# Patient Record
Sex: Male | Born: 1945
Health system: Southern US, Community
[De-identification: ages and names within clinical notes are randomized; demographics above are authoritative.]

## PROBLEM LIST (undated history)

## (undated) DIAGNOSIS — I4719 Other supraventricular tachycardia: Secondary | ICD-10-CM

## (undated) DIAGNOSIS — I471 Supraventricular tachycardia: Secondary | ICD-10-CM

## (undated) DIAGNOSIS — T4145XA Adverse effect of unspecified anesthetic, initial encounter: Secondary | ICD-10-CM

## (undated) DIAGNOSIS — I255 Ischemic cardiomyopathy: Secondary | ICD-10-CM

## (undated) DIAGNOSIS — I4819 Other persistent atrial fibrillation: Secondary | ICD-10-CM

## (undated) DIAGNOSIS — I10 Essential (primary) hypertension: Secondary | ICD-10-CM

## (undated) DIAGNOSIS — K219 Gastro-esophageal reflux disease without esophagitis: Secondary | ICD-10-CM

## (undated) DIAGNOSIS — I2510 Atherosclerotic heart disease of native coronary artery without angina pectoris: Secondary | ICD-10-CM

## (undated) DIAGNOSIS — Z8674 Personal history of sudden cardiac arrest: Secondary | ICD-10-CM

## (undated) DIAGNOSIS — E7800 Pure hypercholesterolemia, unspecified: Secondary | ICD-10-CM

## (undated) DIAGNOSIS — I4901 Ventricular fibrillation: Secondary | ICD-10-CM

## (undated) DIAGNOSIS — IMO0001 Reserved for inherently not codable concepts without codable children: Secondary | ICD-10-CM

## (undated) DIAGNOSIS — T8859XA Other complications of anesthesia, initial encounter: Secondary | ICD-10-CM

## (undated) DIAGNOSIS — K5900 Constipation, unspecified: Secondary | ICD-10-CM

## (undated) DIAGNOSIS — I340 Nonrheumatic mitral (valve) insufficiency: Secondary | ICD-10-CM

## (undated) HISTORY — DX: Ventricular fibrillation: I49.01

## (undated) HISTORY — DX: Other persistent atrial fibrillation: I48.19

## (undated) HISTORY — DX: Atherosclerotic heart disease of native coronary artery without angina pectoris: I25.10

## (undated) HISTORY — DX: Nonrheumatic mitral (valve) insufficiency: I34.0

## (undated) HISTORY — DX: Supraventricular tachycardia: I47.1

## (undated) HISTORY — PX: CORONARY ARTERY BYPASS GRAFT: SHX141

## (undated) HISTORY — PX: OTHER SURGICAL HISTORY: SHX169

## (undated) HISTORY — DX: Other supraventricular tachycardia: I47.19

## (undated) HISTORY — DX: Essential (primary) hypertension: I10

## (undated) HISTORY — DX: Pure hypercholesterolemia, unspecified: E78.00

## (undated) HISTORY — DX: Ischemic cardiomyopathy: I25.5

---

## 2003-05-11 ENCOUNTER — Other Ambulatory Visit: Admission: RE | Admit: 2003-05-11 | Discharge: 2003-05-11 | Payer: Self-pay | Admitting: Dermatology

## 2012-12-18 ENCOUNTER — Encounter (INDEPENDENT_AMBULATORY_CARE_PROVIDER_SITE_OTHER): Payer: Self-pay | Admitting: *Deleted

## 2012-12-30 ENCOUNTER — Ambulatory Visit (INDEPENDENT_AMBULATORY_CARE_PROVIDER_SITE_OTHER): Payer: Self-pay | Admitting: Internal Medicine

## 2013-01-06 ENCOUNTER — Ambulatory Visit (INDEPENDENT_AMBULATORY_CARE_PROVIDER_SITE_OTHER): Payer: Self-pay | Admitting: Internal Medicine

## 2013-01-08 ENCOUNTER — Ambulatory Visit (INDEPENDENT_AMBULATORY_CARE_PROVIDER_SITE_OTHER): Payer: Managed Care, Other (non HMO) | Admitting: Internal Medicine

## 2013-01-08 ENCOUNTER — Telehealth (INDEPENDENT_AMBULATORY_CARE_PROVIDER_SITE_OTHER): Payer: Self-pay | Admitting: *Deleted

## 2013-01-08 ENCOUNTER — Encounter (INDEPENDENT_AMBULATORY_CARE_PROVIDER_SITE_OTHER): Payer: Self-pay | Admitting: Internal Medicine

## 2013-01-08 ENCOUNTER — Other Ambulatory Visit (INDEPENDENT_AMBULATORY_CARE_PROVIDER_SITE_OTHER): Payer: Self-pay | Admitting: *Deleted

## 2013-01-08 VITALS — BP 120/84 | HR 76 | Ht 73.0 in | Wt 224.2 lb

## 2013-01-08 DIAGNOSIS — K5900 Constipation, unspecified: Secondary | ICD-10-CM

## 2013-01-08 DIAGNOSIS — I10 Essential (primary) hypertension: Secondary | ICD-10-CM | POA: Insufficient documentation

## 2013-01-08 DIAGNOSIS — Z1211 Encounter for screening for malignant neoplasm of colon: Secondary | ICD-10-CM

## 2013-01-08 DIAGNOSIS — E7800 Pure hypercholesterolemia, unspecified: Secondary | ICD-10-CM | POA: Insufficient documentation

## 2013-01-08 MED ORDER — PEG-KCL-NACL-NASULF-NA ASC-C 100 G PO SOLR: 1.0000 | Freq: Once | ORAL | Status: DC

## 2013-01-08 NOTE — Progress Notes (Signed)
Subjective:     Patient ID: Lonnie [name], male   DOB: Sep 03, 1946, 67 y.o.   MRN: [id number]  HPI Referred to our office by Dr. Regino Schultze for constipation. He has had constipation for a couple of months or longer. He will have a BM about once a week. Before this, he would have a BM 2-3 times a week. There has been no blood or black stools.  He has to strain to have a BM. No rectal pain.  He takes a stool softner everyday which is not helping.   He has never had a colonoscopy.   Review of Systems see hpi Current Outpatient Prescriptions  Medication Sig Dispense Refill  . lisinopril (PRINIVIL,ZESTRIL) 10 MG tablet Take 10 mg by mouth daily.      . ranitidine (ZANTAC) 150 MG tablet Take 150 mg by mouth 2 (two) times daily.      . simvastatin (ZOCOR) 10 MG tablet Take 10 mg by mouth at bedtime.       No current facility-administered medications for this visit.   Past Medical History  Diagnosis Date  . High cholesterol   . Hypertension    Past Surgical History  Procedure Laterality Date  . Stomach ulcer repair          Objective:   Physical Exam  Filed Vitals:   01/08/13 1522  BP: 120/84  Pulse: 76  Height: 6\' 1"  (1.854 m)  Weight: 224 lb 3.2 oz (101.696 kg)   Alert and oriented. Skin warm and dry. Oral mucosa is moist.   . Sclera anicteric, conjunctivae is pink. Thyroid not enlarged. No cervical lymphadenopathy. Lungs clear. Heart regular rate and rhythm.  Abdomen is soft. Bowel sounds are positive. No hepatomegaly. No abdominal masses felt. No tenderness.  No edema to lower extremities. Stool brown and guaiac negative.      Assessment:    Constipation, new onset. He has been started on a new cholesterol medicaton. No alarm symptoms. He has never undergone a colonoscopy in the pat.      Plan:     Increase fiber. Linzess daily. Colonoscopy with Dr. Karilyn Cota

## 2013-01-08 NOTE — Telephone Encounter (Signed)
Patient needs movi prep 

## 2013-01-08 NOTE — Patient Instructions (Addendum)
Colonoscopy with Dr. Karilyn Cota. Linzess samples given to patient. One a day. Linzess .

## 2013-01-13 ENCOUNTER — Encounter (HOSPITAL_COMMUNITY): Payer: Self-pay | Admitting: Pharmacy Technician

## 2013-01-14 ENCOUNTER — Encounter (INDEPENDENT_AMBULATORY_CARE_PROVIDER_SITE_OTHER): Payer: Self-pay

## 2013-01-27 ENCOUNTER — Telehealth (INDEPENDENT_AMBULATORY_CARE_PROVIDER_SITE_OTHER): Payer: Self-pay | Admitting: *Deleted

## 2013-01-27 MED ORDER — LINACLOTIDE 145 MCG PO CAPS: 145.0000 ug | ORAL_CAPSULE | Freq: Every day | ORAL | Status: DC

## 2013-01-27 NOTE — Telephone Encounter (Signed)
Patient states samples you gave him of Linzess is working and he would like a RX called to [facility] in [place]

## 2013-01-28 ENCOUNTER — Ambulatory Visit (HOSPITAL_COMMUNITY)
Admission: RE | Admit: 2013-01-28 | Discharge: 2013-01-28 | Disposition: A | Discharge status: Home / Self Care | Payer: Managed Care, Other (non HMO) | Source: Ambulatory Visit | Attending: Internal Medicine | Admitting: Internal Medicine

## 2013-01-28 ENCOUNTER — Other Ambulatory Visit (HOSPITAL_COMMUNITY): Payer: Self-pay | Admitting: Internal Medicine

## 2013-01-28 DIAGNOSIS — K5900 Constipation, unspecified: Secondary | ICD-10-CM

## 2013-01-28 DIAGNOSIS — R109 Unspecified abdominal pain: Secondary | ICD-10-CM

## 2013-01-30 ENCOUNTER — Encounter (HOSPITAL_COMMUNITY): Admission: RE | Payer: Self-pay | Source: Ambulatory Visit

## 2013-01-30 ENCOUNTER — Ambulatory Visit (HOSPITAL_COMMUNITY)
Admission: RE | Admit: 2013-01-30 | Payer: Managed Care, Other (non HMO) | Source: Ambulatory Visit | Admitting: Internal Medicine

## 2013-01-30 SURGERY — COLONOSCOPY
Anesthesia: Moderate Sedation

## 2013-01-31 ENCOUNTER — Other Ambulatory Visit: Payer: Self-pay

## 2013-01-31 ENCOUNTER — Inpatient Hospital Stay (HOSPITAL_COMMUNITY): Payer: Managed Care, Other (non HMO)

## 2013-01-31 ENCOUNTER — Encounter (HOSPITAL_COMMUNITY): Payer: Self-pay

## 2013-01-31 ENCOUNTER — Inpatient Hospital Stay (HOSPITAL_COMMUNITY)
Admission: RE | Admit: 2013-01-31 | Discharge: 2013-02-17 | DRG: 216 | Disposition: A | Discharge status: Home / Self Care | Payer: Managed Care, Other (non HMO) | Source: Other Acute Inpatient Hospital | Attending: Thoracic Surgery (Cardiothoracic Vascular Surgery) | Admitting: Thoracic Surgery (Cardiothoracic Vascular Surgery)

## 2013-01-31 DIAGNOSIS — R110 Nausea: Secondary | ICD-10-CM | POA: Diagnosis not present

## 2013-01-31 DIAGNOSIS — E7800 Pure hypercholesterolemia, unspecified: Secondary | ICD-10-CM | POA: Diagnosis present

## 2013-01-31 DIAGNOSIS — I509 Heart failure, unspecified: Secondary | ICD-10-CM

## 2013-01-31 DIAGNOSIS — E8779 Other fluid overload: Secondary | ICD-10-CM | POA: Diagnosis not present

## 2013-01-31 DIAGNOSIS — I340 Nonrheumatic mitral (valve) insufficiency: Secondary | ICD-10-CM

## 2013-01-31 DIAGNOSIS — E871 Hypo-osmolality and hyponatremia: Secondary | ICD-10-CM | POA: Diagnosis not present

## 2013-01-31 DIAGNOSIS — I2510 Atherosclerotic heart disease of native coronary artery without angina pectoris: Secondary | ICD-10-CM

## 2013-01-31 DIAGNOSIS — E8809 Other disorders of plasma-protein metabolism, not elsewhere classified: Secondary | ICD-10-CM | POA: Diagnosis not present

## 2013-01-31 DIAGNOSIS — Z79899 Other long term (current) drug therapy: Secondary | ICD-10-CM

## 2013-01-31 DIAGNOSIS — I471 Supraventricular tachycardia, unspecified: Secondary | ICD-10-CM

## 2013-01-31 DIAGNOSIS — E876 Hypokalemia: Secondary | ICD-10-CM | POA: Diagnosis not present

## 2013-01-31 DIAGNOSIS — I059 Rheumatic mitral valve disease, unspecified: Secondary | ICD-10-CM | POA: Diagnosis present

## 2013-01-31 DIAGNOSIS — R570 Cardiogenic shock: Secondary | ICD-10-CM | POA: Diagnosis not present

## 2013-01-31 DIAGNOSIS — I359 Nonrheumatic aortic valve disorder, unspecified: Secondary | ICD-10-CM

## 2013-01-31 DIAGNOSIS — E162 Hypoglycemia, unspecified: Secondary | ICD-10-CM | POA: Diagnosis not present

## 2013-01-31 DIAGNOSIS — Q2111 Secundum atrial septal defect: Secondary | ICD-10-CM

## 2013-01-31 DIAGNOSIS — I498 Other specified cardiac arrhythmias: Secondary | ICD-10-CM | POA: Diagnosis not present

## 2013-01-31 DIAGNOSIS — N183 Chronic kidney disease, stage 3 unspecified: Secondary | ICD-10-CM | POA: Diagnosis present

## 2013-01-31 DIAGNOSIS — R918 Other nonspecific abnormal finding of lung field: Secondary | ICD-10-CM

## 2013-01-31 DIAGNOSIS — D62 Acute posthemorrhagic anemia: Secondary | ICD-10-CM | POA: Diagnosis not present

## 2013-01-31 DIAGNOSIS — J9600 Acute respiratory failure, unspecified whether with hypoxia or hypercapnia: Secondary | ICD-10-CM

## 2013-01-31 DIAGNOSIS — I472 Ventricular tachycardia, unspecified: Secondary | ICD-10-CM

## 2013-01-31 DIAGNOSIS — Q211 Atrial septal defect: Secondary | ICD-10-CM

## 2013-01-31 DIAGNOSIS — I517 Cardiomegaly: Secondary | ICD-10-CM | POA: Diagnosis present

## 2013-01-31 DIAGNOSIS — I213 ST elevation (STEMI) myocardial infarction of unspecified site: Secondary | ICD-10-CM

## 2013-01-31 DIAGNOSIS — D689 Coagulation defect, unspecified: Secondary | ICD-10-CM | POA: Diagnosis not present

## 2013-01-31 DIAGNOSIS — N179 Acute kidney failure, unspecified: Secondary | ICD-10-CM | POA: Diagnosis present

## 2013-01-31 DIAGNOSIS — I10 Essential (primary) hypertension: Secondary | ICD-10-CM

## 2013-01-31 DIAGNOSIS — K560 Paralytic ileus: Secondary | ICD-10-CM | POA: Diagnosis not present

## 2013-01-31 DIAGNOSIS — J810 Acute pulmonary edema: Secondary | ICD-10-CM

## 2013-01-31 DIAGNOSIS — I2119 ST elevation (STEMI) myocardial infarction involving other coronary artery of inferior wall: Secondary | ICD-10-CM

## 2013-01-31 DIAGNOSIS — I4891 Unspecified atrial fibrillation: Secondary | ICD-10-CM | POA: Diagnosis not present

## 2013-01-31 DIAGNOSIS — Z952 Presence of prosthetic heart valve: Secondary | ICD-10-CM

## 2013-01-31 DIAGNOSIS — D72829 Elevated white blood cell count, unspecified: Secondary | ICD-10-CM

## 2013-01-31 DIAGNOSIS — I2589 Other forms of chronic ischemic heart disease: Secondary | ICD-10-CM | POA: Diagnosis present

## 2013-01-31 DIAGNOSIS — Z8249 Family history of ischemic heart disease and other diseases of the circulatory system: Secondary | ICD-10-CM

## 2013-01-31 DIAGNOSIS — D696 Thrombocytopenia, unspecified: Secondary | ICD-10-CM | POA: Diagnosis not present

## 2013-01-31 DIAGNOSIS — J40 Bronchitis, not specified as acute or chronic: Secondary | ICD-10-CM | POA: Diagnosis not present

## 2013-01-31 DIAGNOSIS — N170 Acute kidney failure with tubular necrosis: Secondary | ICD-10-CM | POA: Diagnosis not present

## 2013-01-31 DIAGNOSIS — I219 Acute myocardial infarction, unspecified: Secondary | ICD-10-CM

## 2013-01-31 DIAGNOSIS — I129 Hypertensive chronic kidney disease with stage 1 through stage 4 chronic kidney disease, or unspecified chronic kidney disease: Secondary | ICD-10-CM | POA: Diagnosis present

## 2013-01-31 DIAGNOSIS — Z8711 Personal history of peptic ulcer disease: Secondary | ICD-10-CM

## 2013-01-31 DIAGNOSIS — R7309 Other abnormal glucose: Secondary | ICD-10-CM | POA: Diagnosis not present

## 2013-01-31 DIAGNOSIS — I4729 Other ventricular tachycardia: Secondary | ICD-10-CM | POA: Diagnosis not present

## 2013-01-31 DIAGNOSIS — K219 Gastro-esophageal reflux disease without esophagitis: Secondary | ICD-10-CM | POA: Diagnosis present

## 2013-01-31 DIAGNOSIS — Z951 Presence of aortocoronary bypass graft: Secondary | ICD-10-CM

## 2013-01-31 DIAGNOSIS — E785 Hyperlipidemia, unspecified: Secondary | ICD-10-CM | POA: Diagnosis present

## 2013-01-31 DIAGNOSIS — Z87891 Personal history of nicotine dependence: Secondary | ICD-10-CM

## 2013-01-31 HISTORY — DX: Other complications of anesthesia, initial encounter: T88.59XA

## 2013-01-31 HISTORY — DX: Constipation, unspecified: K59.00

## 2013-01-31 HISTORY — DX: Gastro-esophageal reflux disease without esophagitis: K21.9

## 2013-01-31 HISTORY — DX: Adverse effect of unspecified anesthetic, initial encounter: T41.45XA

## 2013-01-31 HISTORY — DX: Reserved for inherently not codable concepts without codable children: IMO0001

## 2013-01-31 LAB — CBC WITH DIFFERENTIAL/PLATELET
Eosinophils Absolute: 0.1 10*3/uL (ref 0.0–0.7)
Eosinophils Relative: 0 % (ref 0–5)
HCT: 36.3 % — ABNORMAL LOW (ref 39.0–52.0)
Hemoglobin: 12.5 g/dL — ABNORMAL LOW (ref 13.0–17.0)
Lymphocytes Relative: 14 % (ref 12–46)
Lymphs Abs: 2.2 10*3/uL (ref 0.7–4.0)
MCH: 31.8 pg (ref 26.0–34.0)
MCV: 92.4 fL (ref 78.0–100.0)
Monocytes Relative: 12 % (ref 3–12)
RBC: 3.93 MIL/uL — ABNORMAL LOW (ref 4.22–5.81)
WBC: 15.2 10*3/uL — ABNORMAL HIGH (ref 4.0–10.5)

## 2013-01-31 LAB — COMPREHENSIVE METABOLIC PANEL
ALT: 103 U/L — ABNORMAL HIGH (ref 0–53)
Alkaline Phosphatase: 80 U/L (ref 39–117)
BUN: 29 mg/dL — ABNORMAL HIGH (ref 6–23)
CO2: 22 mEq/L (ref 19–32)
Calcium: 8.8 mg/dL (ref 8.4–10.5)
GFR calc Af Amer: 62 mL/min — ABNORMAL LOW (ref >90)
GFR calc non Af Amer: 54 mL/min — ABNORMAL LOW (ref >90)
Glucose, Bld: 118 mg/dL — ABNORMAL HIGH (ref 70–99)
Sodium: 137 mEq/L (ref 135–145)
Total Protein: 7 g/dL (ref 6.0–8.3)

## 2013-01-31 LAB — TROPONIN I: Troponin I: 1.22 ng/mL (ref <0.30)

## 2013-01-31 LAB — MRSA PCR SCREENING: MRSA by PCR: NEGATIVE

## 2013-01-31 LAB — HEPARIN LEVEL (UNFRACTIONATED)
Heparin Unfractionated: <0.1 IU/mL — ABNORMAL LOW (ref 0.30–0.70)
Heparin Unfractionated: 0.14 IU/mL — ABNORMAL LOW (ref 0.30–0.70)

## 2013-01-31 LAB — MAGNESIUM: Magnesium: 2.1 mg/dL (ref 1.5–2.5)

## 2013-01-31 LAB — CK TOTAL AND CKMB (NOT AT ARMC): CK, MB: 3.7 ng/mL (ref 0.3–4.0)

## 2013-01-31 MED ORDER — METOPROLOL TARTRATE 25 MG PO TABS
25.0000 mg | ORAL_TABLET | Freq: Three times a day (TID) | ORAL | Status: DC
  Administered 2013-01-31 – 2013-02-04 (×11): 25 mg via ORAL
  Filled 2013-01-31 (×19): qty 1

## 2013-01-31 MED ORDER — HEPARIN BOLUS VIA INFUSION
3000.0000 [IU] | Freq: Once | INTRAVENOUS | Status: AC
  Administered 2013-01-31: 3000 [IU] via INTRAVENOUS
  Filled 2013-01-31: qty 3000

## 2013-01-31 MED ORDER — ATORVASTATIN CALCIUM 80 MG PO TABS
80.0000 mg | ORAL_TABLET | Freq: Every day | ORAL | Status: DC
  Administered 2013-01-31 – 2013-02-16 (×14): 80 mg via ORAL
  Filled 2013-01-31 (×18): qty 1

## 2013-01-31 MED ORDER — HEPARIN (PORCINE) IN NACL 100-0.45 UNIT/ML-% IJ SOLN
2100.0000 [IU]/h | INTRAMUSCULAR | Status: DC
  Administered 2013-01-31: 1500 [IU]/h via INTRAVENOUS
  Administered 2013-01-31: 1800 [IU]/h via INTRAVENOUS
  Administered 2013-02-01: 2100 [IU]/h via INTRAVENOUS
  Filled 2013-01-31 (×3): qty 250

## 2013-01-31 MED ORDER — DOCUSATE SODIUM 100 MG PO CAPS
200.0000 mg | ORAL_CAPSULE | Freq: Every day | ORAL | Status: DC | PRN
  Filled 2013-01-31: qty 2

## 2013-01-31 MED ORDER — ALPRAZOLAM 0.5 MG PO TABS
1.0000 mg | ORAL_TABLET | Freq: Two times a day (BID) | ORAL | Status: DC | PRN
  Administered 2013-01-31: 0.5 mg via ORAL
  Administered 2013-02-03 – 2013-02-04 (×3): 1 mg via ORAL
  Filled 2013-01-31: qty 1
  Filled 2013-01-31 (×3): qty 2

## 2013-01-31 MED ORDER — ONDANSETRON HCL 4 MG/2ML IJ SOLN: 4.0000 mg | Freq: Four times a day (QID) | INTRAMUSCULAR | Status: DC | PRN

## 2013-01-31 MED ORDER — NITROGLYCERIN 0.4 MG SL SUBL: 0.4000 mg | SUBLINGUAL_TABLET | SUBLINGUAL | Status: DC | PRN

## 2013-01-31 MED ORDER — MORPHINE SULFATE 2 MG/ML IJ SOLN: 1.0000 mg | INTRAMUSCULAR | Status: DC | PRN

## 2013-01-31 MED ORDER — PNEUMOCOCCAL VAC POLYVALENT 25 MCG/0.5ML IJ INJ
0.5000 mL | INJECTION | INTRAMUSCULAR | Status: AC
  Administered 2013-02-01: 0.5 mL via INTRAMUSCULAR
  Filled 2013-01-31: qty 0.5

## 2013-01-31 MED ORDER — FUROSEMIDE 10 MG/ML IJ SOLN
40.0000 mg | Freq: Three times a day (TID) | INTRAMUSCULAR | Status: DC
  Administered 2013-01-31 – 2013-02-01 (×3): 40 mg via INTRAVENOUS
  Filled 2013-01-31 (×7): qty 4

## 2013-01-31 MED ORDER — HEPARIN BOLUS VIA INFUSION
4000.0000 [IU] | Freq: Once | INTRAVENOUS | Status: AC
  Administered 2013-01-31: 4000 [IU] via INTRAVENOUS
  Filled 2013-01-31: qty 4000

## 2013-01-31 MED ORDER — MORPHINE SULFATE 2 MG/ML IJ SOLN
1.0000 mg | Freq: Once | INTRAMUSCULAR | Status: AC
  Administered 2013-01-31: 1 mg via INTRAVENOUS
  Filled 2013-01-31: qty 1

## 2013-01-31 MED ORDER — ACETAMINOPHEN 325 MG PO TABS
650.0000 mg | ORAL_TABLET | ORAL | Status: DC | PRN
  Administered 2013-01-31 – 2013-02-03 (×3): 650 mg via ORAL
  Filled 2013-01-31 (×3): qty 2

## 2013-01-31 MED ORDER — PANTOPRAZOLE SODIUM 40 MG PO TBEC
40.0000 mg | DELAYED_RELEASE_TABLET | Freq: Every day | ORAL | Status: DC
  Administered 2013-01-31 – 2013-02-04 (×5): 40 mg via ORAL
  Filled 2013-01-31 (×4): qty 1

## 2013-01-31 MED ORDER — ASPIRIN EC 81 MG PO TBEC
81.0000 mg | DELAYED_RELEASE_TABLET | Freq: Every day | ORAL | Status: DC
  Administered 2013-02-01 – 2013-02-02 (×2): 81 mg via ORAL
  Filled 2013-01-31 (×3): qty 1

## 2013-01-31 MED ORDER — NAPROXEN 375 MG PO TABS
375.0000 mg | ORAL_TABLET | Freq: Two times a day (BID) | ORAL | Status: DC
  Administered 2013-01-31 – 2013-02-01 (×2): 375 mg via ORAL
  Filled 2013-01-31 (×4): qty 1

## 2013-01-31 MED ORDER — NAPROXEN 375 MG PO TABS: 375.0000 mg | ORAL_TABLET | Freq: Two times a day (BID) | ORAL | Status: DC | PRN

## 2013-01-31 MED ORDER — FUROSEMIDE 10 MG/ML IJ SOLN
40.0000 mg | Freq: Once | INTRAMUSCULAR | Status: AC
  Administered 2013-01-31: 40 mg via INTRAVENOUS

## 2013-01-31 NOTE — Progress Notes (Signed)
RN called as pt noted to be diaphoretic and SOB, No chest pain.  Evaluated pt, he states SOB baseline same as earlier today and no chest pain. Noted to be sweating which is now better with A/C setting adjustment but no acute distress.  ECG reviewed: Inferior Q wave with ST elevation (not changed), Anterior lead ST depression (not changed), Lateral lead ST depression has improved compared to ECG earlier today. Overall no ECG evidence of new/worsening ischemia. No bradycardia/AVB indicating RV infarct.  PE: sys murmer and faint rubs, positive for JVD, SOB worse with laying flat.  Vitals: BP stable, HR no changes, RR no changes compared to earlier today. O2 Sat >92%  Bebside Echo done: No/trivial pericardial effusion, mild MR, no VSD signal detected, EF 45-50% with dynamic septum and relative inferior hypokinesis. No evidence suggesting free wall defect. No sig changes compared to Echo earlier today. CVP ~15 mmHg.  CE sent STAT: Stable Trop lower than AM. Normal CKMB Cardiac Panel (last 3 results)  Recent Labs  01/31/13 0950 01/31/13 1428 01/31/13 2029  CKTOTAL  --   --  111  CKMB  --   --  3.7  TROPONINI 1.22* 1.11* 1.17*  RELINDX  --   --  3.3*    CXR: diffuse pulmonary edema - worse compared to earlier today  Impression:  1. Increased pulmonary edema/acute CHF in the setting of late presenting MI 2. No new ECG/echo/lab changes indicating new/worsening myocardial ischemia/injury.  Plan: 1. Increase diuresis to prepare cath Monday 2. Close hemodynamic monitor due to risk of possible mechanical complication of Late presenting MI 3. Continue tx for Dressler symdrome 4. Gentle afterload reduction to improve forward flow to reduce pulm edema and myocardial oxygen demand (Morphine)   Orders: 1. Lasix 40 mg IV extra now with increased scheduled Lasix. Further uptitrate per I/o in AM.  2. Morphine 1mg  x1 now and PRN. 3. Cycle CE q 6r. Continue Heparin drip   Discussed with pt,  family and RN.

## 2013-01-31 NOTE — H&P (Signed)
History and Physical  Patient ID: Lonnie [name] MRN: [id number], SOB: [birth year] 67 y.o. Date of Encounter: 01/31/2013, 8:12 AM  Primary Physician: Kirk Ruths, MD Primary Cardiologist: None  Chief Complaint: SOB, chest pressure  HPI: 67 y.o. male w/ PMHx significant for HTN, HL, FH of MI, who presented to [facility] on 01/31/2013 with complaints of SOB and chest pain.  The patient notes a 4-day history of dyspnea on exertion when walking across a room (baseline >1 block), as well as orthopnea requiring him to sit in an upright position (baseline can lie flat), and PND.  He notes no LE swelling.  During this same time, the patient describes chest pressure, described as a bilateral, diffuse, anterior chest pressure sensation, 7/10 in severity, non-radiating, worse with activity, with no alleviating factors.  Unchanged by deep breathing, food intake, or change in position.  The patient notes a non-productive cough, but no fevers, chills, sore throat, rhinorrhea, or sick contacts.  The patient presented to [facility] with these complaints, and was found to have an elevated BNP of 1862, troponin of 2.8, and bilateral opacities on CXR.  EKG showed ST elevation in III, III, aVF, and ST depression in V2-V5, I, and aVL.  The patient was given aspirin 324, lasix 40 IV, and started on a heparin drip.  Patient also given levaquin x1 dose.   Past Medical History  Diagnosis Date  . High cholesterol   . Hypertension   . Constipation   . Reflux   . Complication of anesthesia     "Hard time waking me up" after sedation after dental procedure     Surgical History:  Past Surgical History  Procedure Laterality Date  . Stomach ulcer repair       Home Meds: Prior to Admission medications   Medication Sig Start Date End Date Taking? Authorizing Provider  Linaclotide (LINZESS) 145 MCG CAPS Take 1 capsule (145 mcg total) by mouth daily. 01/27/13   Len Blalock, NP  lisinopril  (PRINIVIL,ZESTRIL) 10 MG tablet Take 10 mg by mouth daily.    Historical Provider, MD  peg 3350 powder (MOVIPREP) 100 G SOLR Take 1 kit (100 g total) by mouth once. 01/08/13   Len Blalock, NP  ranitidine (ZANTAC) 150 MG tablet Take 150 mg by mouth 2 (two) times daily.    Historical Provider, MD  simvastatin (ZOCOR) 10 MG tablet Take 10 mg by mouth at bedtime.    Historical Provider, MD    Allergies: No Known Allergies  History   Social History  . Marital Status: Married    Spouse Name: N/A    Number of Children: N/A  . Years of Education: N/A   Occupational History  . Not on file.   Social History Main Topics  . Smoking status: Former Games developer  . Smokeless tobacco: Not on file     Comment: 40 yrs ago  . Alcohol Use: Yes     Comment: week end - 1/5 of crown  . Drug Use: No  . Sexually Active: Not on file   Other Topics Concern  . Not on file   Social History Narrative  . No narrative on file     Family History  Problem Relation Age of Onset  . Heart attack Mother 10    Review of Systems: General: negative for chills, fever, night sweats or weight changes.  ENT: negative for rhinorrhea or epistaxis Cardiovascular: see HPI Dermatological: negative for rash Respiratory: negative for cough  or wheezing GI: negative for nausea, vomiting, diarrhea, bright red blood per rectum, melena, or hematemesis GU: no hematuria, urgency, or frequency Neurologic: negative for visual changes, syncope, headache, or dizziness Heme: no easy bruising or bleeding Endo: negative for excessive thirst, thyroid disorder, or flushing Musculoskeletal: negative for joint pain or swelling, negative for myalgias All other systems reviewed and are otherwise negative except as noted above.  Physical Exam: Blood pressure 136/99, pulse 99, resp. rate 37, height 6\' 1"  (1.854 m), weight 220 lb 14.4 oz (100.2 kg), SpO2 96.00%. General: Well developed, well nourished, alert and oriented, appears slightly  uncomfortable. HEENT: Normocephalic, atraumatic, sclera non-icteric, no xanthomas, nares are without discharge.  Neck: Supple. Carotids 2+ without bruits. Elevated JVP Lungs: Bilateral crackles in the lower to mid lung fields, mildly increased work of respiration Heart: RRR with normal S1 and S2. Grade III/VI "blowing" systolic murmur best heard at apex Abdomen: Soft, non-tender, non-distended with normoactive bowel sounds. No hepatomegaly. No rebound/guarding. No obvious abdominal masses. Back: No CVA tenderness Msk:  Strength and tone appear normal for age. Extremities: No clubbing, cyanosis, or edema.  Distal pedal pulses are 2+ and equal bilaterally. Neuro: CNII-XII intact, moves all extremities spontaneously. Psych:  Responds to questions appropriately with a normal affect.   Labs:   No results found for this basename: WBC, HGB, HCT, MCV, PLT   No results found for this basename: NA, K, CL, CO2, BUN, CREATININE, CALCIUM, LABALBU, PROT, BILITOT, ALKPHOS, ALT, AST, GLUCOSE,  in the last 168 hours No results found for this basename: CKTOTAL, CKMB, TROPONINI,  in the last 72 hours No results found for this basename: CHOL, HDL, LDLCALC, TRIG   No results found for this basename: DDIMER   Outside Labs from [place]: Na: 135 K: 3.9 Cl: 102 CO2: 23 BUN: 30 Cr: 1.49 Glucose: 132  WBC: 14.6 Hb: 11.7 HCT: 35.8 Plt: 255  Troponin: 2.8 CK: 132 CK-MB: 3.1 BNP: 1862  Radiology/Studies:  Dg Abd Acute W/chest  01/28/2013   *RADIOLOGY REPORT*  Clinical Data: Abdominal pain  ACUTE ABDOMEN SERIES (ABDOMEN 2 VIEW & CHEST 1 VIEW)  Comparison: None  Findings: The heart size is normal.  No pleural effusion identified.  Pulmonary vascular congestion noted.  No airspace consolidation.  Surgical clips are noted within the upper portion of the central abdomen.  No dilated loops of small bowel or air-fluid levels identified.  Gas and stool noted within the colon up to the rectum.  IMPRESSION:  1.   Nonobstructive bowel gas pattern. 2.  No acute cardiopulmonary abnormalities   Original Report Authenticated By: Signa Kell, M.D.     EKG: NSR, ST elevations in II, III, aVL, ST depression in V2-V5, I, and aVL  ASSESSMENT AND PLAN:  The patient is a 67 yo man, history of HTN, HL, FH of CAD, presenting with chest pain, orthopnea, and PND.  # Late presentation STEMI - The patient presents with exertional chest pressure for 4-7 days, with elevated troponin to 2.8 at [facility], and ST elevations in the inferior leads, concerning for inferior infarct.  The patient has a new systolic murmur, which likely represents mitral regurgitation, possibly with cord rupture due to MI.  The patient received aspirin and started a heparin drip at [facility]. -ordered echo -continue heparin drip -daily aspirin -start atorvastatin 80 -consider starting metoprolol 25 BID, pending echo results -consider cardiac cath pending echo results -NPO  # Acute CHF - the patient presents with DOE, orthopnea, and PND, with pulmonary edema  on CXR and elevated BNP, consistent with CHF exacerbation.  The patient has no prior known history of CHF.  This likely represents ischemic CM in the setting of his current MI.  He received lasix 40 mg IV once at [facility] prior to transfer 5/24. -lasix 40 IV TID -consider metoprolol per above -hold lisinopril given AKI -daily weights, strict I/O's -ordered echo for this morning  # AKI vs CKD - the patient presents with a creatinine of 1.49 at [facility].  This likely represents AKI, as patient reports no history of CKD, though we have no records for comparison.  Patient is hypervolemic on exam. -monitor I/O's -daily BMETs  # HTN - the patient has a history of HTN, currently normotensive -consider metoprolol per above -hold lisinopril  # Leukocytosis - the patient's WBC = 14.6 at [facility], likely representing stress response.  Patient given 1 dose of levaquin  for CAP at [facility], though patient notes no fever or productive cough, and CXR shows no obvious infiltrate (though could be masked by pulm edema). -hold antibiotics for now  # Hyperlipidemia - chronic, stable.  On simvastatin 10 at home. -start Atorvastatin 80  # Prophy - heparin  Signed, Janalyn Harder MD 01/31/2013, 8:12 AM  History and all data above reviewed.  Patient examined.  I agree with the findings as above.  The patient presents with chest discomfort and dyspnea starting one week ago.  He had some increased dyspnea Wed and his wife got him to come to the ER today.  He has inferior Q waves with some inferior ST elevation.  CXR was reviewed and there was diffuse pulmonary edema.  He has a wide mediastinum but this was present on CXR a few days ago.   However, this is clearly a late presentation of an inferior MI.  He complains of chest pain.  However, this is with lying flat or inspiration.  He does not sound like he is having active angina.  Troponins are elevated but trending down.  The patient exam reveals COR:RRR, 3/6 holosystolic anterior murmur  ,  Lungs: Bilateral right greater than left crackles  ,  Abd: Positive bowel sounds, no rebound no guarding, Ext No edema  Neck:  JVD 10 cm at 45 degrees  .  All available labs, radiology testing, previous records reviewed. Agree with documented assessment and plan. Inferior MI:  Late presentation with resultant CHF and MR.  I ordered a stat echo and the LV systolic function is not severely reduced EF is about 50% with an area of inferior akinesis.  The MR is not as severe as I would expect and there is no flail leaflet or papillary rupture (reviewed with Dr. Eden Emms).  We need to manage him medically with diuresis, ASA, low dose beta blocker.  I do not think that there is an indication for heparin with this late presentation.  He will eventually (when he is able to tolerate it) need a cath.  Of note we looked for other complications of late  presentation MI and there is no evidence of free wall rupture or VSD.  He will need repeat PA/Lat CXR when he is more stable to look at the mediastinum which I suspect is a tortuous aorta.   Lonnie [name]  11:52 AM  01/31/2013

## 2013-01-31 NOTE — Progress Notes (Signed)
  Echocardiogram 2D Echocardiogram has been performed.  Georgian Co 01/31/2013, 10:22 AM

## 2013-01-31 NOTE — Progress Notes (Signed)
01/31/13 1900  Vitals  BP ! 128/93 mmHg  MAP (mmHg) 101  Pt noted to be diaphoretic and short of breath. No c/o of chest pain. EKG done. Md paged

## 2013-01-31 NOTE — Progress Notes (Signed)
Report from Night RN. Chart reviewed together. Handoff complete.Introductions complete. Will continue to monitor and advise attending as needed.   

## 2013-01-31 NOTE — Progress Notes (Signed)
Lonnie [name]  67 y.o.  male  Subjective: Dyspnea and chest pain improved.  Allergy: Review of patient's allergies indicates no known allergies.  Objective: Vital signs in last 24 hours: Temp:  [97.6 F (36.4 C)-98.8 F (37.1 C)] 98.8 F (37.1 C) (05/24 1200) Pulse Rate:  [91-100] 99 (05/24 1800) Resp:  [24-38] 24 (05/24 1800) BP: (102-136)/(67-102) 125/84 mmHg (05/24 1700) SpO2:  [91 %-97 %] 94 % (05/24 1800) Weight:  [100.2 kg (220 lb 14.4 oz)] 100.2 kg (220 lb 14.4 oz) (05/24 0630)  100.2 kg (220 lb 14.4 oz) Body mass index is 29.15 kg/(m^2).  Weight change:    Total I/O since admission:  -1.2 L  General- Well developed; no acute distress  Neck- + JVD, no carotid bruits Lungs- bibasilar rales; expiratory wheeze Cardiovascular- normal PMI; normal S1 and S2; low pitched systolic decrescendo murmur at the left sternal border Abdomen- normal bowel sounds; soft and non-tender without masses or organomegaly Skin- Warm, no significant lesions Extremities- Nl distal pulses; trace edema  Lab Results: Cardiac Markers:   Recent Labs  01/31/13 0950 01/31/13 1428  TROPONINI 1.22* 1.11*   CBC:   Recent Labs  01/31/13 0950  WBC 15.2*  HGB 12.5*  HCT 36.3*  PLT 304   BMET:  Recent Labs  01/31/13 0950  NA 137  K 4.0  CL 99  CO2 22  GLUCOSE 118*  BUN 29*  CREATININE 1.33  CALCIUM 8.8   Hepatic Function:   Recent Labs  01/31/13 0950  PROT 7.0  ALBUMIN 2.9*  AST 41*  ALT 103*  ALKPHOS 80  BILITOT 0.7   EKG: none available for review  Imaging Studies/Results: Dg Chest   01/31/2013   No significant change in cardiomegaly with multifocal airspace opacities which could represent pneumonia or asymmetric edema.    Imaging: Imaging results have been reviewed  Medications:  I have reviewed the patient's current medications. Scheduled: .  aspirin EC  81 mg Oral Daily  . atorvastatin  80 mg Oral q1800  . furosemide  40 mg Intravenous Q8H  . metoprolol  tartrate  25 mg Oral Q8H  . naproxen  375 mg Oral BID WC  . pantoprazole  40 mg Oral Daily   Infusions:   . 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 Nonsteroidal will be added to current medications with PPI to prophylax against stress-induced GI ulceration.  Congestive heart failure improving with diuresis. Patient should be ready for cardiac catheterization by Monday.  Elevated transaminases, perhaps representing a component of hepatic congestion. Fairly significant hypoalbuminemia, which is probably not explained by acute cardiac problems.  Hypertension is adequately controlled.   LOS: 0 days   Lonnie [name] 01/31/2013, 7:15 PM

## 2013-01-31 NOTE — Progress Notes (Signed)
Patient ID: Lonnie [name], male   DOB: 07/03/1946, 67 y.o.   MRN: [id number] Asked to review echo by Dr Antoine Poche EF 50% with mid and basal inferior wall hypokinesis There is a nonsupported anterior leaflet cusp with prolapse.  Not obviously flail and no ruptured chord or papillary muscle seen His murmur is impressive and I think it is MR Surprised given the structural abnormality that MR is only mild  There is no VSD  Would increase beta blockade HR 90-100 and repeat echo in 3-4 days Check BNP  Discussed with family  [name]

## 2013-01-31 NOTE — Progress Notes (Signed)
CRITICAL VALUE ALERT  Critical value received:  Trop 1.22  Date of notification:  01/31/2013   Time of notification:  10:51 AM   Critical value read back:yes  Nurse who received alert:  Desiree Lucy   MD notified (1st page):  hocherin  Time of first page:  10:51 AM   MD notified (2nd page):  Time of second page:  Responding MD:  hocherin  Time MD responded:  10:51 AM

## 2013-01-31 NOTE — Progress Notes (Signed)
ANTICOAGULATION CONSULT NOTE - Initial Consult  Pharmacy Consult for heparin Indication: chest pain/ACS  No Known Allergies  Patient Measurements: Height: 6\' 1"  (185.4 cm) Weight: 220 lb 14.4 oz (100.2 kg) IBW/kg (Calculated) : 79.9 Heparin Dosing Weight: 100 kg  Vital Signs: Temp: 97.8 F (36.6 C) (05/24 0820) Temp src: Oral (05/24 0820) BP: 124/93 mmHg (05/24 0820) Pulse Rate: 99 (05/24 0630)  Labs:  Recent Labs  01/31/13 0950  HGB 12.5*  HCT 36.3*  PLT 304  APTT 29  LABPROT 14.5  INR 1.15  HEPARINUNFRC <0.10*  CREATININE 1.33  TROPONINI 1.22*    Estimated Creatinine Clearance: 67.1 ml/min (by C-G formula based on Cr of 1.33).   Medical History: Past Medical History  Diagnosis Date  . High cholesterol   . Hypertension   . Constipation   . Reflux   . Complication of anesthesia     "Hard time waking me up" after sedation after dental procedure    Medications:  Prescriptions prior to admission  Medication Sig Dispense Refill  . Linaclotide (LINZESS) 145 MCG CAPS Take 1 capsule (145 mcg total) by mouth daily.  30 capsule  4  . lisinopril (PRINIVIL,ZESTRIL) 10 MG tablet Take 10 mg by mouth daily.      . peg 3350 powder (MOVIPREP) 100 G SOLR Take 1 kit (100 g total) by mouth once.  1 kit  0  . ranitidine (ZANTAC) 150 MG tablet Take 150 mg by mouth 2 (two) times daily.      . simvastatin (ZOCOR) 10 MG tablet Take 10 mg by mouth at bedtime.        Assessment: 67 year old man transferred to [facility] from [facility] hospital this morning on heparin.  Initial dose of 5000 unit bolus and 1000 units/hr started at 0315 today.  Initial heparin level is <0.1.    Goal of Therapy:  Heparin level 0.3-0.7 units/ml Monitor platelets by anticoagulation protocol: Yes   Plan:   Heparin 4000 units IV bolus x 1  Increase heparin drip to 1500 units/hr  Heparin level in 6 hours  Adjust per goal  Daily heparin level and CBC while on heparin  Mickeal Skinner 01/31/2013,11:14 AM

## 2013-02-01 DIAGNOSIS — R918 Other nonspecific abnormal finding of lung field: Secondary | ICD-10-CM

## 2013-02-01 DIAGNOSIS — I2119 ST elevation (STEMI) myocardial infarction involving other coronary artery of inferior wall: Principal | ICD-10-CM

## 2013-02-01 DIAGNOSIS — N179 Acute kidney failure, unspecified: Secondary | ICD-10-CM

## 2013-02-01 DIAGNOSIS — D72829 Elevated white blood cell count, unspecified: Secondary | ICD-10-CM

## 2013-02-01 DIAGNOSIS — J810 Acute pulmonary edema: Secondary | ICD-10-CM

## 2013-02-01 LAB — BASIC METABOLIC PANEL
CO2: 30 mEq/L (ref 19–32)
Chloride: 96 mEq/L (ref 96–112)
Glucose, Bld: 135 mg/dL — ABNORMAL HIGH (ref 70–99)
Sodium: 136 mEq/L (ref 135–145)

## 2013-02-01 LAB — LIPID PANEL
HDL: 31 mg/dL — ABNORMAL LOW (ref >39)
LDL Cholesterol: 95 mg/dL (ref 0–99)
Total CHOL/HDL Ratio: 4.8 RATIO

## 2013-02-01 LAB — CBC
HCT: 35.6 % — ABNORMAL LOW (ref 39.0–52.0)
MCH: 31.9 pg (ref 26.0–34.0)
MCV: 92.2 fL (ref 78.0–100.0)
RBC: 3.86 MIL/uL — ABNORMAL LOW (ref 4.22–5.81)
WBC: 17.3 10*3/uL — ABNORMAL HIGH (ref 4.0–10.5)

## 2013-02-01 LAB — HEMOGLOBIN A1C: Mean Plasma Glucose: 103 mg/dL (ref <117)

## 2013-02-01 LAB — TSH: TSH: 2.711 u[IU]/mL (ref 0.350–4.500)

## 2013-02-01 MED ORDER — HEPARIN BOLUS VIA INFUSION
3000.0000 [IU] | Freq: Once | INTRAVENOUS | Status: AC
  Administered 2013-02-01: 3000 [IU] via INTRAVENOUS
  Filled 2013-02-01: qty 3000

## 2013-02-01 MED ORDER — FUROSEMIDE 10 MG/ML IJ SOLN
INTRAMUSCULAR | Status: AC
  Filled 2013-02-01: qty 4

## 2013-02-01 MED ORDER — LEVOFLOXACIN IN D5W 750 MG/150ML IV SOLN
750.0000 mg | INTRAVENOUS | Status: DC
  Administered 2013-02-01 – 2013-02-03 (×3): 750 mg via INTRAVENOUS
  Filled 2013-02-01 (×5): qty 150

## 2013-02-01 MED ORDER — LEVALBUTEROL HCL 0.63 MG/3ML IN NEBU
0.6300 mg | INHALATION_SOLUTION | Freq: Four times a day (QID) | RESPIRATORY_TRACT | Status: DC
  Administered 2013-02-01 – 2013-02-04 (×6): 0.63 mg via RESPIRATORY_TRACT
  Filled 2013-02-01 (×19): qty 3

## 2013-02-01 MED ORDER — FUROSEMIDE 10 MG/ML IJ SOLN
60.0000 mg | Freq: Three times a day (TID) | INTRAMUSCULAR | Status: DC
  Administered 2013-02-01 – 2013-02-05 (×10): 60 mg via INTRAVENOUS
  Filled 2013-02-01 (×12): qty 6

## 2013-02-01 MED ORDER — HEPARIN SODIUM (PORCINE) 5000 UNIT/ML IJ SOLN
5000.0000 [IU] | Freq: Three times a day (TID) | INTRAMUSCULAR | Status: DC
  Administered 2013-02-01 – 2013-02-04 (×10): 5000 [IU] via SUBCUTANEOUS
  Filled 2013-02-01 (×16): qty 1

## 2013-02-01 NOTE — Consult Note (Signed)
PULMONARY  / CRITICAL CARE MEDICINE  Name: Lonnie [name] MRN: [id number] DOB: 12-17-1945    ADMISSION DATE:  01/31/2013 CONSULTATION DATE:  5-25  REFERRING MD :  Cards  PRIMARY SERVICE: Cards  CHIEF COMPLAINT:  dyspnea  BRIEF PATIENT DESCRIPTION:  67 yo wm with late presentation of inferior MI 5-24. He was acutely SOB prior to admission and is better post diuresis of 1500 cc. His chest xray has ot improved and actually looks worse on 5-25 but he is better. + for night sweats for months(tsh pending) but no fever , chills or purulent sputum. Remote smoker quit 40 years ago. He does have +JVD and a murmur. PCCM has been asked to evaluate for possible pna.   SIGNIFICANT EVENTS / STUDIES:  5/24 :  2 d Echo  with ef 50%  Inferior wall hypokinesis.  LINES / TUBES: PIV  CULTURES: BC x 2 5/25>>   ANTIBIOTICS: none   HISTORY OF PRESENT ILLNESS:   67 yo wm with late presentation of inferior MI 5-24. He was acutely SOB prior to admission and is better post diuresis of 1500 cc. His chest xray has ot improved and actually looks worse on 5-25 but he is better. + for night sweats for months(tsh pending) but no fever , chills or purulent sputum. Remote smoker quit 40 years ago. He does have +JVD and a murmur. PCCM has been asked to evaluate for possible pna.   PAST MEDICAL HISTORY :  Past Medical History  Diagnosis Date  . High cholesterol   . Hypertension   . Constipation   . Reflux   . Complication of anesthesia     "Hard time waking me up" after sedation after dental procedure   Past Surgical History  Procedure Laterality Date  . Stomach ulcer repair     Prior to Admission medications   Medication Sig Start Date End Date Taking? Authorizing Provider  Ascorbic Acid (VITAMIN C PO) Take 1 tablet by mouth daily.   Yes Historical Provider, MD  Omega-3 Fatty Acids (FISH OIL) 1200 MG CAPS Take 1,200 mg by mouth daily.   Yes Historical Provider, MD  ranitidine (ZANTAC) 150 MG tablet  Take 150 mg by mouth 2 (two) times daily.   Yes Historical Provider, MD  VITAMIN E PO Take 1 capsule by mouth daily.   Yes Historical Provider, MD  Linaclotide Karlene Einstein) 145 MCG CAPS Take 1 capsule (145 mcg total) by mouth daily. 01/27/13   Len Blalock, NP  lisinopril (PRINIVIL,ZESTRIL) 10 MG tablet Take 10 mg by mouth daily.    Historical Provider, MD  simvastatin (ZOCOR) 10 MG tablet Take 10 mg by mouth at bedtime.    Historical Provider, MD   No Known Allergies  FAMILY HISTORY:  Family History  Problem Relation Age of Onset  . Heart attack Mother 49   SOCIAL HISTORY:  reports that he has quit smoking. He does not have any smokeless tobacco history on file. He reports that  drinks alcohol. He reports that he does not use illicit drugs. Quit 40 years ago REVIEW OF SYSTEMS:   10 point review of system taken, please see HPI for positives and negatives.   SUBJECTIVE:  NAD VITAL SIGNS: Temp:  [98.1 F (36.7 C)-98.9 F (37.2 C)] 98.1 F (36.7 C) (05/25 1200) Pulse Rate:  [78-106] 86 (05/25 1150) Resp:  [19-38] 20 (05/25 1150) BP: (98-138)/(70-102) 108/73 mmHg (05/25 1200) SpO2:  [89 %-97 %] 97 % (05/25 1150) HEMODYNAMICS: CV stable  On nasal cannula oxygen sats 95%     INTAKE / OUTPUT: Intake/Output     05/24 0701 - 05/25 0700 05/25 0701 - 05/26 0700   P.O. 160    I.V. (mL/kg) 296.8 (3)    Total Intake(mL/kg) 456.8 (4.6)    Urine (mL/kg/hr) 2250 (0.9) 425 (0.7)   Total Output 2250 425   Net -1793.2 -425          PHYSICAL EXAMINATION: General:  WNWDWM eating and in NAD Neuro:  Intact HEENT: +jvd Cardiovascular:  hsr with murmur  Lungs:  Bibasilar faint crackles , exp wheeze  Abdomen: +bs Musculoskeletal:  intact Skin:  warm  LABS:  Recent Labs Lab 01/31/13 0950 01/31/13 1428 01/31/13 2029 02/01/13 0110  HGB 12.5*  --   --  12.3*  WBC 15.2*  --   --  17.3*  PLT 304  --   --  327  NA 137  --   --  136  K 4.0  --   --  4.2  CL 99  --   --  96  CO2 22   --   --  30  GLUCOSE 118*  --   --  135*  BUN 29*  --   --  33*  CREATININE 1.33  --   --  1.47*  CALCIUM 8.8  --   --  9.0  MG 2.1  --   --   --   AST 41*  --   --   --   ALT 103*  --   --   --   ALKPHOS 80  --   --   --   BILITOT 0.7  --   --   --   PROT 7.0  --   --   --   ALBUMIN 2.9*  --   --   --   APTT 29  --   --   --   INR 1.15  --   --   --   TROPONINI 1.22* 1.11* 1.17*  --   PROBNP  --  11616.0*  --   --    No results found for this basename: GLUCAP,  in the last 168 hours  Imaging: Dg Chest Port 1 View  01/31/2013   *RADIOLOGY REPORT*  Clinical Data: Dyspnea.  PORTABLE CHEST - 1 VIEW  Comparison: Chest radiograph performed earlier today at 04:53 p.m.  Findings: The lungs are well-aerated.  There is worsening bilateral airspace opacification, most compatible with diffuse pneumonia. This may also reflect pulmonary edema.  No definite pleural effusion or pneumothorax is seen.  The cardiomediastinal silhouette is mildly enlarged.  No acute osseous abnormalities are seen.  IMPRESSION: Interval worsening of bilateral airspace opacification, most compatible with worsening diffuse pneumonia.  This could also reflect pulmonary edema.  Mild cardiomegaly noted.   Original Report Authenticated By: Tonia Ghent, M.D.   [facility] 1 View  01/31/2013   *RADIOLOGY REPORT*  Clinical Data: Shortness of breath  PORTABLE CHEST - 1 VIEW  Comparison: 01/31/2013  Findings: Mild cardiomegaly again noted.  Patchy bilateral multilobar airspace opacities are again noted.  Prominence of the central vasculature is present.  Trace effusions are noted.  No significant change.  IMPRESSION: No significant change in cardiomegaly with multifocal airspace opacities which could represent pneumonia or asymmetric edema.   Original Report Authenticated By: Christiana Pellant, M.D.     ASSESSMENT / PLAN:  PULMONARY A:BASDZ on CxR    Dyspnea  P:  R/o pna(doubt) Watch and repeat c x r (may be radiographic lag)   Cont diuresis Start empiric levaquin Start flutter valve and xopenex neb med  CARDIOVASCULAR  Intake/Output Summary (Last 24 hours) at 02/01/13 1318 Last data filed at 02/01/13 0900  Gross per 24 hour  Intake 431.55 ml  Output   2225 ml  Net -1793.45 ml   A: Inf MI late presentation. Reduced EF. MVP. BNP>11,000 P:  Per cards Agree with diuresis as able  RENAL Lab Results  Component Value Date   CREATININE 1.47* 02/01/2013   CREATININE 1.33 01/31/2013    A:  Rising creatine post diuresis P:   Gentle diuresis Will dc nsaids with increased creatine and hx of bleeding ulcer. Note ACE-I and rising creatine.  GASTROINTESTINAL A:  Hx of resected bleeding ulcer P:   PPI Note on aleve   HEMATOLOGIC A: Leukocytosis, either from AMI or infection or both P:  Serial CBCs  INFECTIOUS A:  WBC is 17 and has risen, but no other overt s/s of infection P:   monitor temp/wbc Empiric levaquin 750/d F/u BC Sputum c/s if able  Check urine for legionella and strep pneumoniae antigens   TODAY'S SUMMARY:  67 yo wm with late presentation of inferior MI 5-24. He was acutely SOB prior to admission and is better post diuresis of 1500 cc. His chest xray has ot improved and actually looks worse on 5-25 but he is better. + for night sweats for months(tsh pending) but no fever , chills or purulent sputum. Remote smoker quit 40 years ago. He does have +JVD and a murmur.  This pt really has no symptoms referable to infection other than an elevated WBC which can be seen with AMI.  His xray shows patchy pneumonic infiltrates likely from AMI and fractured capilliaries with edema , I doubt PNA. Nevertheless will start iv levaquin.  Will give a flutter valve. Will give neb med for wheezing.  Will f/u serial CXRs   Caryl Bis  [phone]  Cell  [phone]  If no response or cell goes to voicemail, call beeper [phone]  02/01/2013, 1:14 PM

## 2013-02-01 NOTE — Progress Notes (Signed)
Report from Night RN. Chart reviewed together. Handoff complete.Introductions complete. Will continue to monitor and advise attending as needed.   

## 2013-02-01 NOTE — Progress Notes (Addendum)
ANTICOAGULATION CONSULT NOTE - Follow up Consult  Pharmacy Consult for heparin Indication: chest pain/ACS  No Known Allergies  Patient Measurements: Height: 6\' 1"  (185.4 cm) Weight: 220 lb 14.4 oz (100.2 kg) IBW/kg (Calculated) : 79.9 Heparin Dosing Weight: 100 kg  Vital Signs: Temp: 98.9 F (37.2 C) (05/25 0000) Temp src: Oral (05/25 0000) BP: 110/82 mmHg (05/25 0200) Pulse Rate: 85 (05/25 0200)  Labs:  Recent Labs  01/31/13 0950 01/31/13 1428 01/31/13 1729 01/31/13 2029 02/01/13 0109 02/01/13 0110  HGB 12.5*  --   --   --   --  12.3*  HCT 36.3*  --   --   --   --  35.6*  PLT 304  --   --   --   --  327  APTT 29  --   --   --   --   --   LABPROT 14.5  --   --   --   --   --   INR 1.15  --   --   --   --   --   HEPARINUNFRC <0.10* 0.25* 0.14*  --  <0.10*  --   CREATININE 1.33  --   --   --   --  1.47*  CKTOTAL  --   --   --  111  --   --   CKMB  --   --   --  3.7  --   --   TROPONINI 1.22* 1.11*  --  1.17*  --   --     Estimated Creatinine Clearance: 60.7 ml/min (by C-G formula based on Cr of 1.47).   Medical History: Past Medical History  Diagnosis Date  . High cholesterol   . Hypertension   . Constipation   . Reflux   . Complication of anesthesia     "Hard time waking me up" after sedation after dental procedure    Medications:  Prescriptions prior to admission  Medication Sig Dispense Refill  . Ascorbic Acid (VITAMIN C PO) Take 1 tablet by mouth daily.      . Omega-3 Fatty Acids (FISH OIL) 1200 MG CAPS Take 1,200 mg by mouth daily.      . ranitidine (ZANTAC) 150 MG tablet Take 150 mg by mouth 2 (two) times daily.      Marland Kitchen VITAMIN E PO Take 1 capsule by mouth daily.      . Linaclotide (LINZESS) 145 MCG CAPS Take 1 capsule (145 mcg total) by mouth daily.  30 capsule  4  . lisinopril (PRINIVIL,ZESTRIL) 10 MG tablet Take 10 mg by mouth daily.      . simvastatin (ZOCOR) 10 MG tablet Take 10 mg by mouth at bedtime.        Assessment: 67 year old man  transferred to [facility] from [facility] hospital this morning on heparin.  Repeat heparin level is <0.1.    Goal of Therapy:  Heparin level 0.3-0.7 units/ml Monitor platelets by anticoagulation protocol: Yes   Plan:   Heparin 3000 units IV bolus x 1  Increase heparin drip to 2100 units/hr  Heparin level in 6 hours  Adjust per goal  Daily heparin level and CBC while on heparin  [name] 02/01/2013,5:47 AM

## 2013-02-01 NOTE — Progress Notes (Signed)
Notified Md about pts lopressor medication.  pts SBP 94 will hold per md.  Will continue to monitor. Lonnie [name]

## 2013-02-01 NOTE — Progress Notes (Signed)
SUBJECTIVE:  Chest pain as noted yesterday.  Currently no pain free.  Breathing is improved.    PHYSICAL EXAM Filed Vitals:   02/01/13 0400 02/01/13 0500 02/01/13 0600 02/01/13 0700  BP: 107/73 104/83 107/83 103/77  Pulse: 82 79 79 78  Temp: 98.2 F (36.8 C)     TempSrc: Oral     Resp: 19 21 21 23   Height:      Weight:      SpO2: 96% 96% 93% 96%   General:  No acute distress HEENT: PERRL Neck:  JVD 12 cm at 45 degrees. Lungs:  Diffuse crackles entire lung fields Heart:  RRR, murmur unchanged Abdomen:  Positive bowel sounds, no rebound no guarding Extremities:  No edema Neuro:  Nonfocal  LABS: Lab Results  Component Value Date   CKTOTAL 111 01/31/2013   CKMB 3.7 01/31/2013   TROPONINI 1.17* 01/31/2013   Results for orders placed during the hospital encounter of 01/31/13 (from the past 24 hour(s))  TROPONIN I     Status: Abnormal   Collection Time    01/31/13  9:50 AM      Result Value Range   Troponin I 1.22 (*) <0.30 ng/mL  PROTIME-INR     Status: None   Collection Time    01/31/13  9:50 AM      Result Value Range   Prothrombin Time 14.5  11.6 - 15.2 seconds   INR 1.15  0.00 - 1.49  APTT     Status: None   Collection Time    01/31/13  9:50 AM      Result Value Range   aPTT 29  24 - 37 seconds  CBC WITH DIFFERENTIAL     Status: Abnormal   Collection Time    01/31/13  9:50 AM      Result Value Range   WBC 15.2 (*) 4.0 - 10.5 K/uL   RBC 3.93 (*) 4.22 - 5.81 MIL/uL   Hemoglobin 12.5 (*) 13.0 - 17.0 g/dL   HCT 40.9 (*) 81.1 - 91.4 %   MCV 92.4  78.0 - 100.0 fL   MCH 31.8  26.0 - 34.0 pg   MCHC 34.4  30.0 - 36.0 g/dL   RDW 78.2  95.6 - 21.3 %   Platelets 304  150 - 400 K/uL   Neutrophils Relative % 73  43 - 77 %   Neutro Abs 11.1 (*) 1.7 - 7.7 K/uL   Lymphocytes Relative 14  12 - 46 %   Lymphs Abs 2.2  0.7 - 4.0 K/uL   Monocytes Relative 12  3 - 12 %   Monocytes Absolute 1.8 (*) 0.1 - 1.0 K/uL   Eosinophils Relative 0  0 - 5 %   Eosinophils Absolute 0.1   0.0 - 0.7 K/uL   Basophils Relative 0  0 - 1 %   Basophils Absolute 0.0  0.0 - 0.1 K/uL  COMPREHENSIVE METABOLIC PANEL     Status: Abnormal   Collection Time    01/31/13  9:50 AM      Result Value Range   Sodium 137  135 - 145 mEq/L   Potassium 4.0  3.5 - 5.1 mEq/L   Chloride 99  96 - 112 mEq/L   CO2 22  19 - 32 mEq/L   Glucose, Bld 118 (*) 70 - 99 mg/dL   BUN 29 (*) 6 - 23 mg/dL   Creatinine, Ser 0.86  0.50 - 1.35 mg/dL   Calcium 8.8  8.4 -  10.5 mg/dL   Total Protein 7.0  6.0 - 8.3 g/dL   Albumin 2.9 (*) 3.5 - 5.2 g/dL   AST 41 (*) 0 - 37 U/L   ALT 103 (*) 0 - 53 U/L   Alkaline Phosphatase 80  39 - 117 U/L   Total Bilirubin 0.7  0.3 - 1.2 mg/dL   GFR calc non Af Amer 54 (*) >90 mL/min   GFR calc Af Amer 62 (*) >90 mL/min  MAGNESIUM     Status: None   Collection Time    01/31/13  9:50 AM      Result Value Range   Magnesium 2.1  1.5 - 2.5 mg/dL  HEMOGLOBIN Y7W     Status: None   Collection Time    01/31/13  9:50 AM      Result Value Range   Hemoglobin A1C 5.2  <5.7 %   Mean Plasma Glucose 103  <117 mg/dL  HEPARIN LEVEL (UNFRACTIONATED)     Status: Abnormal   Collection Time    01/31/13  9:50 AM      Result Value Range   Heparin Unfractionated <0.10 (*) 0.30 - 0.70 IU/mL  TROPONIN I     Status: Abnormal   Collection Time    01/31/13  2:28 PM      Result Value Range   Troponin I 1.11 (*) <0.30 ng/mL  PRO B NATRIURETIC PEPTIDE     Status: Abnormal   Collection Time    01/31/13  2:28 PM      Result Value Range   Pro B Natriuretic peptide (BNP) 11616.0 (*) 0 - 125 pg/mL  HEPARIN LEVEL (UNFRACTIONATED)     Status: Abnormal   Collection Time    01/31/13  2:28 PM      Result Value Range   Heparin Unfractionated 0.25 (*) 0.30 - 0.70 IU/mL  HEPARIN LEVEL (UNFRACTIONATED)     Status: Abnormal   Collection Time    01/31/13  5:29 PM      Result Value Range   Heparin Unfractionated 0.14 (*) 0.30 - 0.70 IU/mL  TROPONIN I     Status: Abnormal   Collection Time    01/31/13   8:29 PM      Result Value Range   Troponin I 1.17 (*) <0.30 ng/mL  CK TOTAL AND CKMB     Status: Abnormal   Collection Time    01/31/13  8:29 PM      Result Value Range   Total CK 111  7 - 232 U/L   CK, MB 3.7  0.3 - 4.0 ng/mL   Relative Index 3.3 (*) 0.0 - 2.5  HEPARIN LEVEL (UNFRACTIONATED)     Status: Abnormal   Collection Time    02/01/13  1:09 AM      Result Value Range   Heparin Unfractionated <0.10 (*) 0.30 - 0.70 IU/mL  BASIC METABOLIC PANEL     Status: Abnormal   Collection Time    02/01/13  1:10 AM      Result Value Range   Sodium 136  135 - 145 mEq/L   Potassium 4.2  3.5 - 5.1 mEq/L   Chloride 96  96 - 112 mEq/L   CO2 30  19 - 32 mEq/L   Glucose, Bld 135 (*) 70 - 99 mg/dL   BUN 33 (*) 6 - 23 mg/dL   Creatinine, Ser 2.95 (*) 0.50 - 1.35 mg/dL   Calcium 9.0  8.4 - 62.1 mg/dL   GFR calc non  Af Amer 48 (*) >90 mL/min   GFR calc Af Amer 55 (*) >90 mL/min  CBC     Status: Abnormal   Collection Time    02/01/13  1:10 AM      Result Value Range   WBC 17.3 (*) 4.0 - 10.5 K/uL   RBC 3.86 (*) 4.22 - 5.81 MIL/uL   Hemoglobin 12.3 (*) 13.0 - 17.0 g/dL   HCT 19.1 (*) 47.8 - 29.5 %   MCV 92.2  78.0 - 100.0 fL   MCH 31.9  26.0 - 34.0 pg   MCHC 34.6  30.0 - 36.0 g/dL   RDW 62.1  30.8 - 65.7 %   Platelets 327  150 - 400 K/uL  LIPID PANEL     Status: Abnormal   Collection Time    02/01/13  1:10 AM      Result Value Range   Cholesterol 149  0 - 200 mg/dL   Triglycerides 846  <962 mg/dL   HDL 31 (*) >95 mg/dL   Total CHOL/HDL Ratio 4.8     VLDL 23  0 - 40 mg/dL   LDL Cholesterol 95  0 - 99 mg/dL    Intake/Output Summary (Last 24 hours) at 02/01/13 0739 Last data filed at 02/01/13 0500  Gross per 24 hour  Intake  456.8 ml  Output   2250 ml  Net -1793.2 ml   CXR:  Interval worsening of bilateral airspace opacification, most  compatible with worsening diffuse pneumonia. This could also  reflect pulmonary edema. Mild cardiomegaly noted.   EKG:  NSR, rate 85,  inferior infarct, T wave inversion in the anterior leads.  Less ST elevation in the inferior leads.   ASSESSMENT AND PLAN:  ACUTE CHF:  CHF seems to be out of proportion to the degree of LV dysfunction on echo.  CXR today actually looks slightly worse although the patient feels better.  Continuing gentle diuresis.  I am going to order blood and urine cultures and empirically cover for possible pneumonia.  He will need right and left heart cath electively pending his ability to lie flat and his creat.    CAD:  Late presentation of an inferior MI.  AKI:  Follow creat closely as above.   HTN:  This is being managed in the context of treating his CHF  MR:  As noted on the echo.  This is an anteriorly directed jet but does not appear to be a flail leaflet or ruptured pap muscle.  TEE this admission might be indicated.      Rollene Rotunda 02/01/2013 7:39 AM

## 2013-02-02 ENCOUNTER — Inpatient Hospital Stay (HOSPITAL_COMMUNITY): Payer: Managed Care, Other (non HMO)

## 2013-02-02 DIAGNOSIS — I509 Heart failure, unspecified: Secondary | ICD-10-CM

## 2013-02-02 LAB — BASIC METABOLIC PANEL
Calcium: 9 mg/dL (ref 8.4–10.5)
GFR calc non Af Amer: 57 mL/min — ABNORMAL LOW (ref >90)
Sodium: 137 mEq/L (ref 135–145)

## 2013-02-02 MED ORDER — FUROSEMIDE 10 MG/ML IJ SOLN
INTRAMUSCULAR | Status: AC
  Filled 2013-02-02: qty 8

## 2013-02-02 NOTE — Progress Notes (Signed)
Progress Note  Name:  Lonnie [name]  Date:  02/02/2013 7:07 AM    Subjective:  Breathing better.  Still c/o orthopnea.  No PND.  No chest pain.    Objective:    Vital signs in last 24 hours: Temp:  [97.4 F (36.3 C)-98.3 F (36.8 C)] 98.3 F (36.8 C) (05/26 0338) Pulse Rate:  [80-100] 85 (05/26 0513) Resp:  [18-36] 22 (05/26 0338) BP: (94-114)/(65-87) 94/65 mmHg (05/26 0513) SpO2:  [91 %-100 %] 97 % (05/26 0338) Weight:  [218 lb 11.1 oz (99.2 kg)] 218 lb 11.1 oz (99.2 kg) (05/26 0338)  218 lb 11.1 oz (99.2 kg) Body mass index is 28.86 kg/(m^2).  Weight change:  Last BM Date: 01/30/13  Intake/Output from previous day: 05/25 0701 - 05/26 0700 In: 510 [P.O.:360; IV Piggyback:150] Out: 2400 [Urine:2400]   Tele:  NSR  PHYSICAL EXAM General: no acute distress Neck:  + JVD at 90 degrees Lungs: bibasilar rales 2/3 up chest   Heart: normal S1, S2;  Regular rate and rhythm, 3/6 holosystolic murmur at LLSB and apex Abdomen: soft, nontender Extremities: Tr edema  Feet cool Skin:  No rash; warm and dry Psych:  Normal affect  Tele:  SR  Lab Results: Cardiac Markers:   Recent Labs  01/31/13 1428 01/31/13 2029  TROPONINI 1.11* 1.17*   CBC:   Recent Labs  01/31/13 0950 02/01/13 0110  WBC 15.2* 17.3*  HGB 12.5* 12.3*  HCT 36.3* 35.6*  PLT 304 327   BMET:  Recent Labs  01/31/13 0950 02/01/13 0110  NA 137 136  K 4.0 4.2  CL 99 96  CO2 22 30  GLUCOSE 118* 135*  BUN 29* 33*  CREATININE 1.33 1.47*  CALCIUM 8.8 9.0   Hepatic Function:   Recent Labs  01/31/13 0950  PROT 7.0  ALBUMIN 2.9*  AST 41*  ALT 103*  ALKPHOS 80  BILITOT 0.7   GFR:  Estimated Creatinine Clearance: 60.4 ml/min (by C-G formula based on Cr of 1.47). Lipids:   Recent Labs  02/01/13 0110  CHOL 149  TRIG 114  HDL 31*    Radiology/Studies:  Dg Chest Port 1 View  01/31/2013      IMPRESSION: Interval worsening of bilateral airspace opacification, most compatible with worsening  diffuse pneumonia.  This could also reflect pulmonary edema.  Mild cardiomegaly noted.   Original Report Authenticated By: Tonia Ghent, M.D.   Dg Chest Port 1 View  01/31/2013   IMPRESSION: No significant change in cardiomegaly with multifocal airspace opacities which could represent pneumonia or asymmetric edema.   Original Report Authenticated By: Christiana Pellant, M.D.   Dg Abd Acute W/chest  01/28/2013      IMPRESSION:  1.  Nonobstructive bowel gas pattern. 2.  No acute cardiopulmonary abnormalities   Original Report Authenticated By: Signa Kell, M.D.   Echo 01/31/13: Study Conclusions  - Left ventricle: Mid and basal inferior wall hypokinesis The cavity size was mildly dilated. Wall thickness was increased in a pattern of mild LVH. The estimated ejection fraction was 50%. - Mitral valve: There appears to be a prolapsing segment to the anterior leaflet. Surprisingly degree of MR appears more mild than expected. No flail chords or papillary muscle seen Mild regurgitation. - Left atrium: The atrium was mildly dilated. - Atrial septum: Some subcostal images suggest flow across the atrial septum but I suspect this is eccentric TR. No VSD flow - Impressions: Suggest increase beta blocker Rx and repeat echo next week regarding MR  Impressions:  - Suggest increase beta blocker Rx and repeat echo next week regarding MR   Assessment and Plan:  1. Acute CHF:  In setting of late presentation MI with fairly preserved LVF and mild MR.  Continues to diurese and improve symptomatically.  Still has volume to lose.  Continue Lasix 60 IV q 8 hrs.  CXR from this AM pending. 2. Abnormal CXR:  Appreciate evaluation and management by PCCM.  Patient covered with empiric antibiotics currently.  Cultures pending.   3. CAD:  Late presentation inferior MI.  Will ultimately need cardiac cath.  Continue ASA, heparin, statin. 4. AKI:  Renal fxn stable.  AM labs pending. 5. HTN:  Controlled.  6. Mitral  Regurgitation:  More mild than expected.  May need TEE.  Signed, Tereso Newcomer, PA-C  02/02/2013 7:08 AM   Patient seen and examined  I have amended physical exam to reflect findings.   Patient still remains volume overloaded, unable to lie flat  BP is 90s to 100/ I have reviewed echo done a couple days ago.  Inferior wall appears severely hypokinetic/ akinetic (base/mid)  MR is more severe than reported--eccentric.  I think more due to a posterior leaflet problem.  RV function is not normal which supports his current hemodynamics. Continue IV lasix  His Cr is a little better at 1.27.  Watch BP/ Cath when fluids improve. He should have a TEE to evaluate MV   Dietrich Pates

## 2013-02-02 NOTE — Progress Notes (Signed)
PULMONARY  / CRITICAL CARE MEDICINE  Name: Lonnie [name] MRN: [id number] DOB: 1945-11-25    ADMISSION DATE:  01/31/2013 CONSULTATION DATE:  5-25  REFERRING MD :  Cards  PRIMARY SERVICE: Cards  CHIEF COMPLAINT:  dyspnea  BRIEF PATIENT DESCRIPTION:  67 yo wm with late presentation of inferior MI 5-24. He was acutely SOB prior to admission and is better post diuresis of 1500 cc. His chest xray has ot improved and actually looks worse on 5-25 but he is better. + for night sweats for months(tsh pending) but no fever , chills or purulent sputum. Remote smoker quit 40 years ago. He does have +JVD and a murmur. PCCM has been asked to evaluate for possible pna.   SIGNIFICANT EVENTS / STUDIES:  5/24 :  2 d Echo  with ef 50%  Inferior wall hypokinesis.  LINES / TUBES: PIV  CULTURES: BC x 2 5/25>>   ANTIBIOTICS: 5/25 levaquin >>   HISTORY OF PRESENT ILLNESS:   67 yo wm with late presentation of inferior MI 5-24. He was acutely SOB prior to admission and is better post diuresis of 1500 cc. His chest xray has ot improved and actually looks worse on 5-25 but he is better. + for night sweats for months(tsh pending) but no fever , chills or purulent sputum. Remote smoker quit 40 years ago. He does have +JVD and a murmur. PCCM has been asked to evaluate for possible pna.  SUBJECTIVE:  Afebrile Breathing better Had to sleep in recliner last 2 ds  VITAL SIGNS: Temp:  [97.4 F (36.3 C)-98.7 F (37.1 C)] 98.7 F (37.1 C) (05/26 0800) Pulse Rate:  [80-100] 82 (05/26 0800) Resp:  [18-36] 21 (05/26 0800) BP: (93-114)/(65-87) 93/65 mmHg (05/26 0800) SpO2:  [91 %-100 %] 98 % (05/26 0800) Weight:  [99.2 kg (218 lb 11.1 oz)] 99.2 kg (218 lb 11.1 oz) (05/26 0338) HEMODYNAMICS: CV stable   On nasal cannula oxygen 4L sats 95%     INTAKE / OUTPUT: Intake/Output     05/25 0701 - 05/26 0700 05/26 0701 - 05/27 0700   P.O. 360    I.V. (mL/kg)     IV Piggyback 150    Total Intake(mL/kg) 510  (5.1)    Urine (mL/kg/hr) 2400 (1) 150 (0.5)   Total Output 2400 150   Net -1890 -150          PHYSICAL EXAMINATION: General:  WNWDWM eating and in NAD Neuro:  Intact HEENT: +jvd Cardiovascular:  hsr with murmur  Lungs:  Bibasilar faint crackles , exp wheeze  Abdomen: +bs Musculoskeletal:  intact Skin:  warm  LABS:  Recent Labs Lab 01/31/13 0950 01/31/13 1428 01/31/13 2029 02/01/13 0110 02/02/13 0845  HGB 12.5*  --   --  12.3*  --   WBC 15.2*  --   --  17.3*  --   PLT 304  --   --  327  --   NA 137  --   --  136 137  K 4.0  --   --  4.2 3.9  CL 99  --   --  96 95*  CO2 22  --   --  30 30  GLUCOSE 118*  --   --  135* 180*  BUN 29*  --   --  33* 35*  CREATININE 1.33  --   --  1.47* 1.26  CALCIUM 8.8  --   --  9.0 9.0  MG 2.1  --   --   --   --  AST 41*  --   --   --   --   ALT 103*  --   --   --   --   ALKPHOS 80  --   --   --   --   BILITOT 0.7  --   --   --   --   PROT 7.0  --   --   --   --   ALBUMIN 2.9*  --   --   --   --   APTT 29  --   --   --   --   INR 1.15  --   --   --   --   TROPONINI 1.22* 1.11* 1.17*  --   --   PROBNP  --  11616.0*  --   --   --    No results found for this basename: GLUCAP,  in the last 168 hours  Imaging: Dg Chest Port 1 View  02/02/2013   *RADIOLOGY REPORT*  Clinical Data: Shortness of breath.  PORTABLE CHEST - 1 VIEW  Comparison: 01/31/2013.  Findings: The cardiac silhouette, mediastinal and hilar contours are prominent but unchanged.  Persistent bilateral interstitial and airspace process.  No definite pleural effusions.  IMPRESSION: Persistent bilateral interstitial and airspace process.   Original Report Authenticated By: Rudie Meyer, M.D.   Dg Chest Port 1 View  01/31/2013   *RADIOLOGY REPORT*  Clinical Data: Dyspnea.  PORTABLE CHEST - 1 VIEW  Comparison: Chest radiograph performed earlier today at 04:53 p.m.  Findings: The lungs are well-aerated.  There is worsening bilateral airspace opacification, most compatible with  diffuse pneumonia. This may also reflect pulmonary edema.  No definite pleural effusion or pneumothorax is seen.  The cardiomediastinal silhouette is mildly enlarged.  No acute osseous abnormalities are seen.  IMPRESSION: Interval worsening of bilateral airspace opacification, most compatible with worsening diffuse pneumonia.  This could also reflect pulmonary edema.  Mild cardiomegaly noted.   Original Report Authenticated By: Tonia Ghent, M.D.   [facility] 1 View  01/31/2013   *RADIOLOGY REPORT*  Clinical Data: Shortness of breath  PORTABLE CHEST - 1 VIEW  Comparison: 01/31/2013  Findings: Mild cardiomegaly again noted.  Patchy bilateral multilobar airspace opacities are again noted.  Prominence of the central vasculature is present.  Trace effusions are noted.  No significant change.  IMPRESSION: No significant change in cardiomegaly with multifocal airspace opacities which could represent pneumonia or asymmetric edema.   Original Report Authenticated By: Christiana Pellant, M.D.     ASSESSMENT / PLAN:  PULMONARY A:BASDZ on CxR - Favor edema rather than pna based on development after adm, high BNP  P:   R/o pna(doubt) Watch and repeat c x r (may be radiographic lag)  Cont diuresis ct empiric levaquin Start flutter valve and xopenex neb med  CARDIOVASCULAR  Intake/Output Summary (Last 24 hours) at 02/02/13 0955 Last data filed at 02/02/13 0800  Gross per 24 hour  Intake    510 ml  Output   2125 ml  Net  -1615 ml   A: Inf MI late presentation. Reduced EF. MVP. BNP>11,000 P:  Per cards Agree with diuresis as able  RENAL Lab Results  Component Value Date   CREATININE 1.26 02/02/2013   CREATININE 1.47* 02/01/2013   CREATININE 1.33 01/31/2013    A:  Rising creatine post diuresis P:   Gentle diuresis Will dc nsaids with increased creatine and hx of bleeding ulcer. Note ACE-I and rising creatine.  GASTROINTESTINAL A:  Hx of resected bleeding ulcer P:   PPI Note on aleve    HEMATOLOGIC A: Leukocytosis, either from AMI or infection or both P:  Serial CBCs  INFECTIOUS A:  WBC is 17 and has risen, but no other overt s/s of infection P:   monitor temp/wbc Empiric levaquin 750/d F/u BC Sputum c/s if able  await urine for legionella and strep pneumoniae antigens   TODAY'S SUMMARY:  67 yo wm with late presentation of inferior MI 5-24. He was acutely SOB prior to admission and is better post diuresis of 1500 cc. His chest xray has ot improved and actually looks worse on 5-25 but he is better. + for night sweats for months(tsh pending) but no fever , chills or purulent sputum. Remote smoker quit 40 years ago. He does have +JVD and a murmur.  This pt really has no symptoms referable to infection other than an elevated WBC which can be seen with AMI.  His xray shows patchy pneumonic infiltrates likely from AMI and fractured capilliaries with edema , I doubt PNA.- Improved with diuresis ct iv levaquin.  Will give a flutter valve. Will give neb med for wheezing.  Will f/u serial CXRs   [facility] V.MD Beeper  [phone]    If no response , call beeper [phone]  02/02/2013, 9:55 AM

## 2013-02-03 ENCOUNTER — Other Ambulatory Visit: Payer: Self-pay | Admitting: *Deleted

## 2013-02-03 ENCOUNTER — Encounter (HOSPITAL_COMMUNITY)
Admission: RE | Disposition: A | Discharge status: Home / Self Care | Payer: Self-pay | Source: Other Acute Inpatient Hospital | Attending: Thoracic Surgery (Cardiothoracic Vascular Surgery)

## 2013-02-03 DIAGNOSIS — I2510 Atherosclerotic heart disease of native coronary artery without angina pectoris: Secondary | ICD-10-CM

## 2013-02-03 DIAGNOSIS — I359 Nonrheumatic aortic valve disorder, unspecified: Secondary | ICD-10-CM

## 2013-02-03 DIAGNOSIS — I059 Rheumatic mitral valve disease, unspecified: Secondary | ICD-10-CM

## 2013-02-03 HISTORY — PX: LEFT HEART CATHETERIZATION WITH CORONARY ANGIOGRAM: SHX5451

## 2013-02-03 LAB — BASIC METABOLIC PANEL
GFR calc Af Amer: 63 mL/min — ABNORMAL LOW (ref >90)
GFR calc non Af Amer: 54 mL/min — ABNORMAL LOW (ref >90)
Potassium: 3.7 mEq/L (ref 3.5–5.1)
Sodium: 137 mEq/L (ref 135–145)

## 2013-02-03 LAB — POCT I-STAT 3, VENOUS BLOOD GAS (G3P V)
Bicarbonate: 35.9 mEq/L — ABNORMAL HIGH (ref 20.0–24.0)
Bicarbonate: 34.6 mEq/L — ABNORMAL HIGH (ref 20.0–24.0)
pH, Ven: 7.499 — ABNORMAL HIGH (ref 7.250–7.300)
pH, Ven: 7.43 — ABNORMAL HIGH (ref 7.250–7.300)
pO2, Ven: 29 mmHg — CL (ref 30.0–45.0)

## 2013-02-03 SURGERY — LEFT HEART CATHETERIZATION WITH CORONARY ANGIOGRAM
Anesthesia: LOCAL

## 2013-02-03 MED ORDER — FENTANYL CITRATE 0.05 MG/ML IJ SOLN
INTRAMUSCULAR | Status: AC
  Filled 2013-02-03: qty 2

## 2013-02-03 MED ORDER — SODIUM CHLORIDE 0.9 % IV SOLN
1.0000 mL/kg/h | INTRAVENOUS | Status: DC
  Administered 2013-02-03: 1 mL/kg/h via INTRAVENOUS

## 2013-02-03 MED ORDER — LIDOCAINE HCL (PF) 1 % IJ SOLN
INTRAMUSCULAR | Status: AC
  Filled 2013-02-03: qty 30

## 2013-02-03 MED ORDER — SODIUM CHLORIDE 0.9 % IV SOLN: INTRAVENOUS | Status: AC

## 2013-02-03 MED ORDER — DIAZEPAM 5 MG PO TABS
5.0000 mg | ORAL_TABLET | ORAL | Status: AC
  Administered 2013-02-03: 5 mg via ORAL
  Filled 2013-02-03: qty 1

## 2013-02-03 MED ORDER — HEPARIN (PORCINE) IN NACL 2-0.9 UNIT/ML-% IJ SOLN
INTRAMUSCULAR | Status: AC
  Filled 2013-02-03: qty 1000

## 2013-02-03 MED ORDER — ASPIRIN EC 81 MG PO TBEC
81.0000 mg | DELAYED_RELEASE_TABLET | Freq: Every day | ORAL | Status: DC
  Administered 2013-02-04: 81 mg via ORAL
  Filled 2013-02-03 (×2): qty 1

## 2013-02-03 MED ORDER — ASPIRIN 81 MG PO CHEW
324.0000 mg | CHEWABLE_TABLET | ORAL | Status: AC
  Administered 2013-02-03: 324 mg via ORAL
  Filled 2013-02-03: qty 4

## 2013-02-03 MED ORDER — MIDAZOLAM HCL 2 MG/2ML IJ SOLN
INTRAMUSCULAR | Status: AC
  Filled 2013-02-03: qty 2

## 2013-02-03 MED ORDER — SODIUM CHLORIDE 0.9 % IV SOLN
INTRAVENOUS | Status: DC
  Administered 2013-02-04: 02:00:00 via INTRAVENOUS
  Administered 2013-02-04: 500 mL via INTRAVENOUS

## 2013-02-03 NOTE — Consult Note (Signed)
Reason for Consult: 2 vessel CAD and mitral regurgitation post MI Referring Physician: Dr. Arida  Lonnie [name] is an 66 y.o. male.  HPI:  66 y.o. male with cc/o SOB and chest pressure.  66 yo male with no prior cardiac history, but multiple cardiac risk factors (HTN, dyslipidemia and a strong family history of CAD) presented on 5/24 with a 4-day history of dyspnea on exertion with even minimal exertion and orthopnea and PND. He also had vague chest pressure. This was a diffuse anterior chest pressure sensation. It was non-radiating, worsened with activity, with no alleviating factors. He also had a non-productive cough, but no fevers or chills.  He presented to [facility] on 5/24 and was found to have an elevated BNP of 1862, troponin of 2.8, and bilateral opacities on CXR. An EKG showed ST elevation in III, III, aVF, and ST depression in V2-V5, I, and aVL. He was treated medically with improvement in his orthopnea and PND. An echocardiogram was done which showed some anterior leaflet prolapse but only mild MR. Today he underwent catheterization which revealed severe 2 vessel CAD. He had a large V wave, and severe MR with the LV gram.   Past Medical History  Diagnosis Date  . High cholesterol   . Hypertension   . Constipation   . Reflux   . Complication of anesthesia     "Hard time waking me up" after sedation after dental procedure    Past Surgical History  Procedure Laterality Date  . Stomach ulcer repair      Family History  Problem Relation Age of Onset  . Heart attack Mother 55  Brother (younger) died of MI  Social History:  reports that he has quit smoking. He does not have any smokeless tobacco history on file. He reports that  drinks alcohol. He reports that he does not use illicit drugs.  Allergies: No Known Allergies  Medications:  Prior to Admission:  Prescriptions prior to admission  Medication Sig Dispense Refill  . Ascorbic Acid (VITAMIN C PO) Take 1  tablet by mouth daily.      . Omega-3 Fatty Acids (FISH OIL) 1200 MG CAPS Take 1,200 mg by mouth daily.      . ranitidine (ZANTAC) 150 MG tablet Take 150 mg by mouth 2 (two) times daily.      . VITAMIN E PO Take 1 capsule by mouth daily.      . Linaclotide (LINZESS) 145 MCG CAPS Take 1 capsule (145 mcg total) by mouth daily.  30 capsule  4  . lisinopril (PRINIVIL,ZESTRIL) 10 MG tablet Take 10 mg by mouth daily.      . simvastatin (ZOCOR) 10 MG tablet Take 10 mg by mouth at bedtime.        Results for orders placed during the hospital encounter of 01/31/13 (from the past 48 hour(s))  BASIC METABOLIC PANEL     Status: Abnormal   Collection Time    02/02/13  8:45 AM      Result Value Range   Sodium 137  135 - 145 mEq/L   Potassium 3.9  3.5 - 5.1 mEq/L   Chloride 95 (*) 96 - 112 mEq/L   CO2 30  19 - 32 mEq/L   Glucose, Bld 180 (*) 70 - 99 mg/dL   BUN 35 (*) 6 - 23 mg/dL   Creatinine, Ser 1.26  0.50 - 1.35 mg/dL   Calcium 9.0  8.4 - 10.5 mg/dL   GFR calc non Af   Amer 57 (*) >90 mL/min   GFR calc Af Amer 66 (*) >90 mL/min   Comment:            The eGFR has been calculated     using the CKD EPI equation.     This calculation has not been     validated in all clinical     situations.     eGFR's persistently     <90 mL/min signify     possible Chronic Kidney Disease.  BASIC METABOLIC PANEL     Status: Abnormal   Collection Time    02/03/13  3:55 AM      Result Value Range   Sodium 137  135 - 145 mEq/L   Potassium 3.7  3.5 - 5.1 mEq/L   Chloride 93 (*) 96 - 112 mEq/L   CO2 35 (*) 19 - 32 mEq/L   Glucose, Bld 116 (*) 70 - 99 mg/dL   BUN 35 (*) 6 - 23 mg/dL   Creatinine, Ser 1.32  0.50 - 1.35 mg/dL   Calcium 9.5  8.4 - 10.5 mg/dL   GFR calc non Af Amer 54 (*) >90 mL/min   GFR calc Af Amer 63 (*) >90 mL/min   Comment:            The eGFR has been calculated     using the CKD EPI equation.     This calculation has not been     validated in all clinical     situations.     eGFR's  persistently     <90 mL/min signify     possible Chronic Kidney Disease.  POCT I-STAT 3, BLOOD GAS (G3P V)     Status: Abnormal   Collection Time    02/03/13  1:27 PM      Result Value Range   pH, Ven 7.499 (*) 7.250 - 7.300   pCO2, Ven 46.2  45.0 - 50.0 mmHg   pO2, Ven 56.0 (*) 30.0 - 45.0 mmHg   Bicarbonate 35.9 (*) 20.0 - 24.0 mEq/L   TCO2 37  0 - 100 mmol/L   O2 Saturation 91.0     Acid-Base Excess 11.0 (*) 0.0 - 2.0 mmol/L   Sample type VENOUS    POCT I-STAT 3, BLOOD GAS (G3P V)     Status: Abnormal   Collection Time    02/03/13  1:34 PM      Result Value Range   pH, Ven 7.430 (*) 7.250 - 7.300   pCO2, Ven 52.2 (*) 45.0 - 50.0 mmHg   pO2, Ven 29.0 (*) 30.0 - 45.0 mmHg   Bicarbonate 34.6 (*) 20.0 - 24.0 mEq/L   TCO2 36  0 - 100 mmol/L   O2 Saturation 56.0     Acid-Base Excess 9.0 (*) 0.0 - 2.0 mmol/L   Sample type VENOUS     Comment NOTIFIED PHYSICIAN    POCT ACTIVATED CLOTTING TIME     Status: None   Collection Time    02/03/13  1:53 PM      Result Value Range   Activated Clotting Time 154      Dg Chest Port 1 View  02/02/2013   *RADIOLOGY REPORT*  Clinical Data: Shortness of breath.  PORTABLE CHEST - 1 VIEW  Comparison: 01/31/2013.  Findings: The cardiac silhouette, mediastinal and hilar contours are prominent but unchanged.  Persistent bilateral interstitial and airspace process.  No definite pleural effusions.  IMPRESSION: Persistent bilateral interstitial and airspace process.   Original   Report Authenticated By: P. Gallerani, M.D.    Review of Systems  Constitutional: Positive for malaise/fatigue. Negative for fever and chills.  Respiratory: Positive for shortness of breath. Negative for wheezing.   Cardiovascular: Positive for chest pain and orthopnea. Negative for palpitations, claudication and leg swelling.  Gastrointestinal: Negative for nausea, vomiting and abdominal pain.  Genitourinary: Negative for dysuria and urgency.  Neurological: Positive for  weakness.  Endo/Heme/Allergies: Does not bruise/bleed easily.  All other systems reviewed and are negative.   Blood pressure 105/72, pulse 96, temperature 98.9 F (37.2 C), temperature source Oral, resp. rate 24, height 6' 1" (1.854 m), weight 214 lb 15.2 oz (97.5 kg), SpO2 98.00%. Physical Exam  Vitals reviewed. Constitutional: He is oriented to person, place, and time. He appears well-developed and well-nourished. No distress.  HENT:  Head: Normocephalic and atraumatic.  Eyes: EOM are normal. Pupils are equal, round, and reactive to light.  Neck: Neck supple. No thyromegaly present.  Cardiovascular: Normal rate and regular rhythm.   Murmur (2/6 systolic murmur at apex) heard. Respiratory: He has no wheezes.  Diminished BS bilateral bases, + rales  GI: Soft. There is no tenderness.  Musculoskeletal: Normal range of motion. He exhibits no edema.  Lymphadenopathy:    He has no cervical adenopathy.  Neurological: He is alert and oriented to person, place, and time. No cranial nerve deficit.  Skin: Skin is warm and dry.   ECHOCARDIOGRAM Study Conclusions  - Left ventricle: Mid and basal inferior wall hypokinesis The cavity size was mildly dilated. Wall thickness was increased in a pattern of mild LVH. The estimated ejection fraction was 50%. - Mitral valve: There appears to be a prolapsing segment to the anterior leaflet. Surprisingly degree of MR appears more mild than expected. No flail chords or papillary muscle seen Mild regurgitation. - Left atrium: The atrium was mildly dilated. - Atrial septum: Some subcostal images suggest flow across the atrial septum but I suspect this is eccentric TR. No VSD flow - Impressions: Suggest increase beta blocker Rx and repeat echo next week regarding MR Impressions:  - Suggest increase beta blocker Rx and repeat echo next week regarding MR  CARDIAC CATHETERIZATION  Hemodynamics  RA 8 mmHg  RV 42/2 mmHg  PA 47/15/35 mmHg  PCWP  30 mmHg with giant V waves  LV 82/12 mmHg . LVEDP: 26 mmHg  AO 80/57 mmHg  Oxygen saturations:  PA 56%  AO 91%  Cardiac Output (Fick) 5.04  Cardiac Index (Fick) 2.27  Aortic Valve: Peak to Peak gradient: Not significant  Pulmonary vascular resistance (PVR): Less than 1 Woods units.  Coronary angiography:  Coronary dominance: Right  Left Main: Normal in size with no significant disease.  Left Anterior Descending (LAD): Normal in size with an eccentric 95% proximal stenosis at the origin of a large first diagonal. The rest of the vessel has minor irregularities.  1st diagonal (D1): Large in size with 50-60% ostial stenosis.  2nd diagonal (D2): Small in size with minor irregularities.  3rd diagonal (D3): Very small in size.  Circumflex (LCx): Normal in size and nondominant. There is 40% tubular mid stenosis without any other obstructive disease.  1st obtuse marginal: Small in size with minor irregularities.  2nd obtuse marginal: Large in size with 30% ostial stenosis  3rd obtuse marginal: Normal in size with minor irregularities.  Ramus Intermedius: Small in size with minor irregularities.  Right Coronary Artery: Large in size and dominant. There is significant ectasia in the proximal segment. In the   midsegment, there is a 70% tubular stenosis. The vessel is subtotally occluded distally with TIMI 1 flow. It fills via faint collaterals from the left coronary arteries Left ventriculography: Left ventricular systolic function is mildly , LVEF is estimated at 45-50 % with significant inferior wall hypokinesis, there is severe mitral regurgitation  Final Conclusions:  1. Moderately elevated filling pressures with giant V waves noted on PCW pressure tracing. No significant pulmonary hypertension. Low normal cardiac output.  2. Mildly reduced LV systolic function with evidence of severe mitral regurgitation on left ventricular angiography.  3. Severe 2 vessel coronary artery disease with an occluded  distal RCA which is likely the culprit for inferior ST elevation MI. There is also severe eccentric stenosis in the proximal LAD  Recommendations:  Cardiothoracic surgery consult for mitral valve repair and CABG. A TEE will be ordered for tomorrow. continue IV Lasix   Assessment/Plan: 66 yo male with multiple CRF. Presented late in the course of an inferior MI. He has severe 2 vessel CAD and likely severe MR as well. His echo and cath were somewhat contradictory as to the degree of MR and he is going to have a TEE tomorrow to better assess the mitral valve.  In any event he definitely needs CABG for survival benefit and relief of symptoms. He likely will need a mitral repair as well.   I discussed with him the general nature of the procedure, need for general anesthesia, the use of cardiopulmonary bypass, and the incisions to be used. I discussed the expected hospital stay, overall recovery and short and long term outcomes. He understands the risks include, but are not limited to death, stroke, MI, DVT/PE, bleeding, possible need for transfusion, infections, cardiac arrhythmias, heart block requiring permanent pacemaker placement , and other organ system dysfunction including respiratory, renal, or GI complications. He accepts these risks and agrees to proceed.  Will meet with him again tomorrow to discuss the results of the echo as they relate to the planned surgery.  Tentatively plan surgery Thursday 5/29  He is on levaquin for possible pneumonia, but there is no real proof that he has pneumonia and I would not want to delay his surgery.  Lautaro Koral C 02/03/2013, 5:48 PM      

## 2013-02-03 NOTE — Care Management Note (Signed)
    Page 1 of 1   02/03/2013     9:44:28 AM   CARE MANAGEMENT NOTE 02/03/2013  Patient:  Lonnie [name], Lonnie [name]   Account Number:  [id number]  Date Initiated:  02/03/2013  Documentation initiated by:  Junius Creamer  Subjective/Objective Assessment:   adm w mi, pul edema     Action/Plan:   lives w wife, pcp dr Karleen Hampshire   Anticipated DC Date:     Anticipated DC Plan:        DC Planning Services  CM consult      Choice offered to / List presented to:             Status of service:   Medicare Important Message given?   (If response is "NO", the following Medicare IM given date fields will be blank) Date Medicare IM given:   Date Additional Medicare IM given:    Discharge Disposition:    Per UR Regulation:  Reviewed for med. necessity/level of care/duration of stay  If discussed at Long Length of Stay Meetings, dates discussed:    Comments:

## 2013-02-03 NOTE — CV Procedure (Signed)
   Cardiac Catheterization Procedure Note  Name: Lonnie [name] MRN: [id number] DOB: 01-01-1946  Procedure: Right Heart Cath, Left Heart Cath, Selective Coronary Angiography, LV angiography  Indication: Inferior ST elevation myocardial infarction with late presentation and mitral regurgitation.   Procedural Details: The right groin was prepped, draped, and anesthetized with 1% lidocaine. Using the modified Seldinger technique a 5 French sheath was placed in the right femoral artery and a 7 French sheath was placed in the right femoral vein. A Swan-Ganz catheter was used for the right heart catheterization. Standard protocol was followed for recording of right heart pressures and sampling of oxygen saturations. Fick cardiac output was calculated. Standard Judkins catheters were used for selective coronary angiography and left ventriculography. A JL 5 catheter was used to go to engage the left main coronary artery. There were no immediate procedural complications. The patient was transferred to the post catheterization recovery area for further monitoring.  Procedural Findings: Hemodynamics RA 8 mmHg RV 42/2 mmHg PA 47/15/35 mmHg PCWP 30 mmHg with giant V waves LV 82/12 mmHg . LVEDP: 26 mmHg AO 80/57 mmHg  Oxygen saturations: PA 56% AO 91%  Cardiac Output (Fick) 5.04  Cardiac Index (Fick) 2.27   Aortic Valve: Peak to Peak gradient: Not significant  Pulmonary vascular resistance (PVR): Less than 1 Woods units.   Coronary angiography: Coronary dominance: Right   Left Main:  Normal in size with no significant disease.  Left Anterior Descending (LAD):  Normal in size with an eccentric 95% proximal stenosis at the origin of a large first diagonal. The rest of the vessel has minor irregularities.  1st diagonal (D1):  Large in size with 50-60% ostial stenosis.  2nd diagonal (D2):  Small in size with minor irregularities.  3rd diagonal (D3):  Very small in size.  Circumflex (LCx):   Normal in size and nondominant. There is 40% tubular mid stenosis without any other obstructive disease.  1st obtuse marginal:  Small in size with minor irregularities.  2nd obtuse marginal:  Large in size with 30% ostial stenosis  3rd obtuse marginal:  Normal in size with minor irregularities.   Ramus Intermedius:  Small in size with minor irregularities.  Right Coronary Artery: Large in size and dominant. There is significant ectasia in the proximal segment. In the midsegment, there is a 70% tubular stenosis. The vessel is subtotally occluded distally with TIMI 1 flow. It fills via faint collaterals from the left coronary arteries   Left ventriculography: Left ventricular systolic function is mildly , LVEF is estimated at 45-50 % with significant inferior wall hypokinesis, there is severe mitral regurgitation   Final Conclusions:   1. Moderately elevated filling pressures with giant V waves noted on PCW pressure tracing. No significant pulmonary hypertension. Low normal cardiac output. 2. Mildly reduced LV systolic function with evidence of severe mitral regurgitation on left ventricular angiography. 3. Severe 2 vessel coronary artery disease with an occluded distal RCA which is likely the culprit for inferior ST elevation MI. There is also severe eccentric stenosis in the proximal LAD  Recommendations:  Cardiothoracic surgery consult for mitral valve repair and CABG. A TEE will be ordered for tomorrow. continue IV Lasix   Lorine Bears MD, [facility] 02/03/2013, 2:01 PM

## 2013-02-03 NOTE — Progress Notes (Signed)
SUBJECTIVE:  Breathing continues to improved.      PHYSICAL EXAM Filed Vitals:   02/02/13 2046 02/03/13 0000 02/03/13 0500 02/03/13 0607  BP: 99/67 93/65  91/59  Pulse: 82 82  82  Temp:      TempSrc:      Resp:      Height:      Weight:   214 lb 15.2 oz (97.5 kg)   SpO2: 97% 94%  95%   General:  No acute distress HEENT: PERRL Neck:  JVD 10 cm at 45 degrees. Lungs:  Diffuse crackles much improved Heart:  RRR, murmur unchanged Abdomen:  Positive bowel sounds, no rebound no guarding Extremities:  No edema Neuro:  Nonfocal  LABS: Lab Results  Component Value Date   CKTOTAL 111 01/31/2013   CKMB 3.7 01/31/2013   TROPONINI 1.17* 01/31/2013   Results for orders placed during the hospital encounter of 01/31/13 (from the past 24 hour(s))  BASIC METABOLIC PANEL     Status: Abnormal   Collection Time    02/02/13  8:45 AM      Result Value Range   Sodium 137  135 - 145 mEq/L   Potassium 3.9  3.5 - 5.1 mEq/L   Chloride 95 (*) 96 - 112 mEq/L   CO2 30  19 - 32 mEq/L   Glucose, Bld 180 (*) 70 - 99 mg/dL   BUN 35 (*) 6 - 23 mg/dL   Creatinine, Ser 1.61  0.50 - 1.35 mg/dL   Calcium 9.0  8.4 - 09.6 mg/dL   GFR calc non Af Amer 57 (*) >90 mL/min   GFR calc Af Amer 66 (*) >90 mL/min  BASIC METABOLIC PANEL     Status: Abnormal   Collection Time    02/03/13  3:55 AM      Result Value Range   Sodium 137  135 - 145 mEq/L   Potassium 3.7  3.5 - 5.1 mEq/L   Chloride 93 (*) 96 - 112 mEq/L   CO2 35 (*) 19 - 32 mEq/L   Glucose, Bld 116 (*) 70 - 99 mg/dL   BUN 35 (*) 6 - 23 mg/dL   Creatinine, Ser 0.45  0.50 - 1.35 mg/dL   Calcium 9.5  8.4 - 40.9 mg/dL   GFR calc non Af Amer 54 (*) >90 mL/min   GFR calc Af Amer 63 (*) >90 mL/min    Intake/Output Summary (Last 24 hours) at 02/03/13 0659 Last data filed at 02/03/13 0600  Gross per 24 hour  Intake    150 ml  Output   2775 ml  Net  -2625 ml   CXR:  Interval worsening of bilateral airspace opacification, most  compatible with  worsening diffuse pneumonia. This could also  reflect pulmonary edema. Mild cardiomegaly noted.   EKG:  NSR, rate 85, inferior infarct, T wave inversion in the anterior leads.  Less ST elevation in the inferior leads.   ASSESSMENT AND PLAN:  ACUTE CHF:  He is OK for cath right and left today.  The patient understands that risks included but are not limited to stroke (1 in 1000), death (1 in 1000), kidney failure [usually temporary] (1 in 500), bleeding (1 in 200), allergic reaction [possibly serious] (1 in 200).  The patient understands and agrees to proceed.     CAD:  Late presentation of an inferior MI.  As above  AKI:  Creat is down from his peak.  Limit contrast.    HTN:  BP  has been low and beta blockers dose is held intermittently.    MR:  As noted on the echo.  This is an anteriorly directed jet but does not appear to be a flail leaflet or ruptured pap muscle.  TEE later this admission  RESPIRATORY FAILURE:  Probably all related to pulmonary edema.  However, I appreciate the input of pulmonary.  The patient is being covered by antibiotics.   Fayrene Fearing [facility] 02/03/2013 6:59 AM

## 2013-02-03 NOTE — Progress Notes (Signed)
To the cath lab by bed. 

## 2013-02-03 NOTE — Progress Notes (Signed)
Back from the cath lab by bed, awake and alert, instructed to be on bedrest x  4hours, and to call  For any signs of bleeding

## 2013-02-04 ENCOUNTER — Inpatient Hospital Stay (HOSPITAL_COMMUNITY): Payer: Managed Care, Other (non HMO)

## 2013-02-04 ENCOUNTER — Encounter (HOSPITAL_COMMUNITY)
Admission: RE | Disposition: A | Discharge status: Home / Self Care | Payer: Self-pay | Source: Other Acute Inpatient Hospital | Attending: Thoracic Surgery (Cardiothoracic Vascular Surgery)

## 2013-02-04 ENCOUNTER — Encounter (HOSPITAL_COMMUNITY): Payer: Self-pay | Admitting: Gastroenterology

## 2013-02-04 DIAGNOSIS — I059 Rheumatic mitral valve disease, unspecified: Secondary | ICD-10-CM

## 2013-02-04 DIAGNOSIS — Z01810 Encounter for preprocedural cardiovascular examination: Secondary | ICD-10-CM

## 2013-02-04 HISTORY — PX: TEE WITHOUT CARDIOVERSION: SHX5443

## 2013-02-04 LAB — URINALYSIS, ROUTINE W REFLEX MICROSCOPIC
Bilirubin Urine: NEGATIVE
Nitrite: NEGATIVE
Specific Gravity, Urine: 1.015 (ref 1.005–1.030)
Urobilinogen, UA: 0.2 mg/dL (ref 0.0–1.0)
pH: 6 (ref 5.0–8.0)

## 2013-02-04 LAB — BASIC METABOLIC PANEL
Chloride: 93 mEq/L — ABNORMAL LOW (ref 96–112)
GFR calc Af Amer: 57 mL/min — ABNORMAL LOW (ref >90)
GFR calc non Af Amer: 49 mL/min — ABNORMAL LOW (ref >90)
Glucose, Bld: 111 mg/dL — ABNORMAL HIGH (ref 70–99)
Potassium: 4 mEq/L (ref 3.5–5.1)
Sodium: 138 mEq/L (ref 135–145)

## 2013-02-04 LAB — BLOOD GAS, ARTERIAL
Acid-Base Excess: 8 mmol/L — ABNORMAL HIGH (ref 0.0–2.0)
Bicarbonate: 31.9 meq/L — ABNORMAL HIGH (ref 20.0–24.0)
Drawn by: 27022
O2 Content: 3 L/min
O2 Saturation: 95.3 %
Patient temperature: 98.6
TCO2: 33.3 mmol/L (ref 0–100)
pCO2 arterial: 43.7 mmHg (ref 35.0–45.0)
pH, Arterial: 7.477 — ABNORMAL HIGH (ref 7.350–7.450)
pO2, Arterial: 76.5 mmHg — ABNORMAL LOW (ref 80.0–100.0)

## 2013-02-04 LAB — URINE MICROSCOPIC-ADD ON

## 2013-02-04 LAB — ABO/RH: ABO/RH(D): O POS

## 2013-02-04 SURGERY — ECHOCARDIOGRAM, TRANSESOPHAGEAL
Anesthesia: Moderate Sedation

## 2013-02-04 MED ORDER — DOPAMINE-DEXTROSE 3.2-5 MG/ML-% IV SOLN
2.0000 ug/kg/min | INTRAVENOUS | Status: AC
  Administered 2013-02-05: 3 ug/kg/min via INTRAVENOUS
  Filled 2013-02-04: qty 250

## 2013-02-04 MED ORDER — PLASMA-LYTE 148 IV SOLN
INTRAVENOUS | Status: AC
  Administered 2013-02-05: 09:00:00
  Filled 2013-02-04: qty 2.5

## 2013-02-04 MED ORDER — BISACODYL 5 MG PO TBEC
5.0000 mg | DELAYED_RELEASE_TABLET | Freq: Once | ORAL | Status: AC
  Administered 2013-02-04: 5 mg via ORAL
  Filled 2013-02-04: qty 1

## 2013-02-04 MED ORDER — SODIUM CHLORIDE 0.9 % IV SOLN
INTRAVENOUS | Status: AC
  Administered 2013-02-05: 3.1 [IU]/h via INTRAVENOUS
  Filled 2013-02-04: qty 1

## 2013-02-04 MED ORDER — BUTAMBEN-TETRACAINE-BENZOCAINE 2-2-14 % EX AERO
INHALATION_SPRAY | CUTANEOUS | Status: DC | PRN
  Administered 2013-02-04: 2 via TOPICAL

## 2013-02-04 MED ORDER — MAGNESIUM SULFATE 50 % IJ SOLN
40.0000 meq | INTRAMUSCULAR | Status: DC
  Filled 2013-02-04: qty 10

## 2013-02-04 MED ORDER — EPINEPHRINE HCL 1 MG/ML IJ SOLN
0.5000 ug/min | INTRAVENOUS | Status: DC
  Filled 2013-02-04: qty 4

## 2013-02-04 MED ORDER — PHENYLEPHRINE HCL 10 MG/ML IJ SOLN
30.0000 ug/min | INTRAVENOUS | Status: AC
  Administered 2013-02-05: 25 ug/min via INTRAVENOUS
  Filled 2013-02-04: qty 2

## 2013-02-04 MED ORDER — CHLORHEXIDINE GLUCONATE 4 % EX LIQD
60.0000 mL | Freq: Once | CUTANEOUS | Status: AC
  Administered 2013-02-04: 4 via TOPICAL
  Filled 2013-02-04: qty 60

## 2013-02-04 MED ORDER — DIAZEPAM 5 MG PO TABS
5.0000 mg | ORAL_TABLET | Freq: Once | ORAL | Status: DC
  Filled 2013-02-04: qty 1

## 2013-02-04 MED ORDER — VANCOMYCIN HCL 10 G IV SOLR
1250.0000 mg | INTRAVENOUS | Status: AC
  Administered 2013-02-05: 1250 mg via INTRAVENOUS
  Filled 2013-02-04: qty 1250

## 2013-02-04 MED ORDER — SODIUM CHLORIDE 0.9 % IV SOLN
INTRAVENOUS | Status: DC
  Administered 2013-02-05: 70 mL/h via INTRAVENOUS
  Filled 2013-02-04 (×2): qty 40

## 2013-02-04 MED ORDER — POTASSIUM CHLORIDE 2 MEQ/ML IV SOLN
80.0000 meq | INTRAVENOUS | Status: DC
  Filled 2013-02-04: qty 40

## 2013-02-04 MED ORDER — FENTANYL CITRATE 0.05 MG/ML IJ SOLN
INTRAMUSCULAR | Status: DC | PRN
  Administered 2013-02-04 (×2): 25 ug via INTRAVENOUS

## 2013-02-04 MED ORDER — CHLORHEXIDINE GLUCONATE 4 % EX LIQD
60.0000 mL | Freq: Once | CUTANEOUS | Status: AC
  Administered 2013-02-05: 4 via TOPICAL
  Filled 2013-02-04: qty 60

## 2013-02-04 MED ORDER — DEXTROSE 5 % IV SOLN
750.0000 mg | INTRAVENOUS | Status: DC
  Filled 2013-02-04: qty 750

## 2013-02-04 MED ORDER — MIDAZOLAM HCL 5 MG/ML IJ SOLN
INTRAMUSCULAR | Status: AC
  Filled 2013-02-04: qty 2

## 2013-02-04 MED ORDER — ALBUTEROL SULFATE (5 MG/ML) 0.5% IN NEBU
2.5000 mg | INHALATION_SOLUTION | Freq: Once | RESPIRATORY_TRACT | Status: AC
  Administered 2013-02-04: 2.5 mg via RESPIRATORY_TRACT

## 2013-02-04 MED ORDER — FENTANYL CITRATE 0.05 MG/ML IJ SOLN
INTRAMUSCULAR | Status: AC
  Filled 2013-02-04: qty 2

## 2013-02-04 MED ORDER — SODIUM CHLORIDE 0.9 % IV SOLN: INTRAVENOUS | Status: DC

## 2013-02-04 MED ORDER — MIDAZOLAM HCL 10 MG/2ML IJ SOLN
INTRAMUSCULAR | Status: DC | PRN
  Administered 2013-02-04 (×2): 2 mg via INTRAVENOUS

## 2013-02-04 MED ORDER — TEMAZEPAM 15 MG PO CAPS: 15.0000 mg | ORAL_CAPSULE | Freq: Once | ORAL | Status: AC | PRN

## 2013-02-04 MED ORDER — DEXMEDETOMIDINE HCL IN NACL 400 MCG/100ML IV SOLN
0.1000 ug/kg/h | INTRAVENOUS | Status: AC
  Administered 2013-02-05: 0.2 ug/kg/h via INTRAVENOUS
  Filled 2013-02-04: qty 100

## 2013-02-04 MED ORDER — SODIUM CHLORIDE 0.9 % IV SOLN
INTRAVENOUS | Status: DC
  Filled 2013-02-04: qty 30

## 2013-02-04 MED ORDER — LEVOFLOXACIN 750 MG PO TABS
750.0000 mg | ORAL_TABLET | Freq: Every day | ORAL | Status: DC
  Filled 2013-02-04: qty 1

## 2013-02-04 MED ORDER — NITROGLYCERIN IN D5W 200-5 MCG/ML-% IV SOLN
2.0000 ug/min | INTRAVENOUS | Status: AC
  Administered 2013-02-05: 5 ug/min via INTRAVENOUS
  Filled 2013-02-04: qty 250

## 2013-02-04 MED ORDER — DEXTROSE 5 % IV SOLN
1.5000 g | INTRAVENOUS | Status: AC
  Administered 2013-02-05: 1.5 g via INTRAVENOUS
  Administered 2013-02-05: .75 g via INTRAVENOUS
  Filled 2013-02-04: qty 1.5

## 2013-02-04 NOTE — Progress Notes (Signed)
VASCULAR LAB PRELIMINARY  PRELIMINARY  PRELIMINARY  PRELIMINARY  Pre-op Cardiac Surgery  Carotid Findings:  Bilateral:  No evidence of hemodynamically significant internal carotid artery stenosis.   Vertebral artery flow is antegrade.     Upper Extremity Right Left  Brachial Pressures 95 Triphasic 95 Triphasic  Radial Waveforms Triphasic Triphasic  Ulnar Waveforms Triphasic Triphasic  Palmar Arch (Allen's Test) Abnormal Normal    Findings:  Right Doppler waveforms remained normal with radial compression and diminished 50% with ulnar compression. Left Doppler waveforms remained normal with both radial and ulnar compressions.    Lower  Extremity Right Left  Dorsalis Pedis    Anterior Tibial    Posterior Tibial    Ankle/Brachial Indices      Findings:  Bi;ateral palpable pedal pulses   Lonnie [name], RVS 02/04/2013, 3:41 PM

## 2013-02-04 NOTE — Progress Notes (Signed)
Day of Surgery Procedure(s) (LRB): TRANSESOPHAGEAL ECHOCARDIOGRAM (TEE) (N/A) Subjective: No complaints today. Breathing easier, no CP  Objective: Vital signs in last 24 hours: Temp:  [97.9 F (36.6 C)-98.9 F (37.2 C)] 98 F (36.7 C) (05/28 1211) Pulse Rate:  [72-101] 79 (05/28 1211) Cardiac Rhythm:  [-] Normal sinus rhythm (05/28 1211) Resp:  [15-33] 23 (05/28 1154) BP: (85-165)/(52-132) 94/68 mmHg (05/28 1211) SpO2:  [86 %-98 %] 93 % (05/28 1211) Weight:  [215 lb 6.2 oz (97.7 kg)] 215 lb 6.2 oz (97.7 kg) (05/28 0600)  Hemodynamic parameters for last 24 hours:    Intake/Output from previous day: 05/27 0701 - 05/28 0700 In: 1164 [P.O.:300; I.V.:714; IV Piggyback:150] Out: 2345 [Urine:2345] Intake/Output this shift: Total I/O In: 350 [I.V.:350] Out: 100 [Urine:100]  General appearance: alert and no distress Neurologic: intact Heart: regular rate and rhythm and + systolic murmur Lungs: rales bibasilar  Lab Results: No results found for this basename: WBC, HGB, HCT, PLT,  in the last 72 hours BMET:  Recent Labs  02/03/13 0355 02/04/13 1020  NA 137 138  K 3.7 4.0  CL 93* 93*  CO2 35* 36*  GLUCOSE 116* 111*  BUN 35* 37*  CREATININE 1.32 1.43*  CALCIUM 9.5 9.5    PT/INR: No results found for this basename: LABPROT, INR,  in the last 72 hours ABG    Component Value Date/Time   HCO3 34.6* 02/03/2013 1334   TCO2 36 02/03/2013 1334   O2SAT 56.0 02/03/2013 1334   CBG (last 3)  No results found for this basename: GLUCAP,  in the last 72 hours  Assessment/Plan: S/P Procedure(s) (LRB): TRANSESOPHAGEAL ECHOCARDIOGRAM (TEE) (N/A) Severe 2 vessel CAD s/p MI, severe MR TEE showed severe MR with a flail segment of the posterior leaflet  Will need mitral repair in addition to CABG  He is aware of the risks and benefits of the procedure and wishes to proceed.   For CABG , mitral repair in AM 5/29   LOS: 4 days    Leandro Berkowitz C 02/04/2013

## 2013-02-04 NOTE — Progress Notes (Signed)
[id number] Education completed on activity progression after surgery. Discussed sternal precautions and demonstrated how to get up and down without using arms. Pt states he can get 1500 on IS. Has [facility]HS booklet. To watch preop video with wife later. Stressed importance of mobility after surgery. Pt motivated and states he is usually very active and wants to get back to these activities as soon as he can. Will follow  Up after surgery. Luetta Nutting RNBSN

## 2013-02-04 NOTE — CV Procedure (Signed)
    Transesophageal Echocardiogram Note  Lonnie [name] [id number] 20-Jul-1946  Procedure: Transesophageal Echocardiogram Indications: mitral reguirgitation  Procedure Details Consent: Obtained Time Out: Verified patient identification, verified procedure, site/side was marked, verified correct patient position, special equipment/implants available, Radiology Safety Procedures followed,  medications/allergies/relevent history reviewed, required imaging and test results available.  Performed  Medications: Fentanyl: 50 mcg IV Versed: 4 mg IV  Left Ventrical:  Normal LV function  Mitral Valve: prolapse of the posterior leaflet .  There appears to be a ruptured chordae with a flail segment.   Severe MR.  I did not see reversal of flow in the pulmonary veins.  The heart was rotated and the 3D images were not as good as we had hoped to achieve.  Aortic Valve: normal  Tricuspid Valve: normal  Pulmonic Valve: normal  Left Atrium/ Left atrial appendage: normal  Atrial septum: bowing left to right.  There was a small PFO with left to right shunting by color flow  Aorta: normal.   Complications: No apparent complications Patient did tolerate procedure well.   Vesta Mixer, [name]., MD, [facility] 02/04/2013, 11:30 AM

## 2013-02-04 NOTE — Progress Notes (Signed)
  Echocardiogram Echocardiogram Transesophageal has been performed.  Lonnie [name] 02/04/2013, 12:07 PM

## 2013-02-04 NOTE — Progress Notes (Signed)
Asked to address duration of abx by Cardiology. The presentation was not suggestive of PNA. The CXR was more suggestive of edema. He has not been febrile since admission and his resp status has improved with diuresis. Suspect all of his resp symptoms are attributable to pulm edema. Will D/C abx.   PCCM will sign off. Please call if we can be of further assistance  Billy Fischer, MD ; [facility] [phone].  After 5:30 PM or weekends, call [phone]

## 2013-02-04 NOTE — Progress Notes (Signed)
   SUBJECTIVE:  Breathing continues to be improved.      PHYSICAL EXAM Filed Vitals:   02/04/13 0200 02/04/13 0350 02/04/13 0400 02/04/13 0600  BP: 165/132 93/61 98/60 91/60   Pulse: 77 79 78 72  Temp:  97.9 F (36.6 C)    TempSrc:  Oral    Resp: 21 24 23 20   Height:      Weight:      SpO2: 95% 93% 95% 94%   General:  No acute distress HEENT: PERRL Neck:  JVD 8 cm at 45 degrees. Lungs:  Diffuse crackles much improved right greater than left.  Heart:  RRR, murmur unchanged Abdomen:  Positive bowel sounds, no rebound no guarding Extremities:  No edema Neuro:  Nonfocal  LABS:  Results for orders placed during the hospital encounter of 01/31/13 (from the past 24 hour(s))  POCT I-STAT 3, BLOOD GAS (G3P V)     Status: Abnormal   Collection Time    02/03/13  1:27 PM      Result Value Range   pH, Ven 7.499 (*) 7.250 - 7.300   pCO2, Ven 46.2  45.0 - 50.0 mmHg   pO2, Ven 56.0 (*) 30.0 - 45.0 mmHg   Bicarbonate 35.9 (*) 20.0 - 24.0 mEq/L   TCO2 37  0 - 100 mmol/L   O2 Saturation 91.0     Acid-Base Excess 11.0 (*) 0.0 - 2.0 mmol/L   Sample type VENOUS    POCT I-STAT 3, BLOOD GAS (G3P V)     Status: Abnormal   Collection Time    02/03/13  1:34 PM      Result Value Range   pH, Ven 7.430 (*) 7.250 - 7.300   pCO2, Ven 52.2 (*) 45.0 - 50.0 mmHg   pO2, Ven 29.0 (*) 30.0 - 45.0 mmHg   Bicarbonate 34.6 (*) 20.0 - 24.0 mEq/L   TCO2 36  0 - 100 mmol/L   O2 Saturation 56.0     Acid-Base Excess 9.0 (*) 0.0 - 2.0 mmol/L   Sample type VENOUS     Comment NOTIFIED PHYSICIAN    POCT ACTIVATED CLOTTING TIME     Status: None   Collection Time    02/03/13  1:53 PM      Result Value Range   Activated Clotting Time 154      Intake/Output Summary (Last 24 hours) at 02/04/13 0631 Last data filed at 02/04/13 0600  Gross per 24 hour  Intake   1164 ml  Output   2445 ml  Net  -1281 ml    ASSESSMENT AND PLAN:  ACUTE CHF:  Cath with occluded distal RCA and high grade LAD stenosis.   Evidence of severe MR.  [facility] has seen the patient.     CoOx 56.  Good CO on right heart.  However, unable to titrate meds with low BP.   CAD:  Late presentation of an inferior MI.  As above  AKI:  Creat pending this am.      HTN:  BP has been low for the most part.     MR:  TEE today.   RESPIRATORY FAILURE: I will defer whether to  continue antibiotics to Pulmonary Medicine.    Rollene Rotunda 02/04/2013 6:31 AM

## 2013-02-05 ENCOUNTER — Other Ambulatory Visit: Payer: Self-pay

## 2013-02-05 ENCOUNTER — Inpatient Hospital Stay (HOSPITAL_COMMUNITY): Payer: Managed Care, Other (non HMO)

## 2013-02-05 ENCOUNTER — Inpatient Hospital Stay (HOSPITAL_COMMUNITY): Payer: Managed Care, Other (non HMO) | Admitting: Anesthesiology

## 2013-02-05 ENCOUNTER — Encounter (HOSPITAL_COMMUNITY)
Admission: RE | Disposition: A | Discharge status: Home / Self Care | Payer: Self-pay | Source: Other Acute Inpatient Hospital | Attending: Thoracic Surgery (Cardiothoracic Vascular Surgery)

## 2013-02-05 ENCOUNTER — Encounter (HOSPITAL_COMMUNITY): Payer: Self-pay | Admitting: Anesthesiology

## 2013-02-05 DIAGNOSIS — Q211 Atrial septal defect: Secondary | ICD-10-CM

## 2013-02-05 DIAGNOSIS — I059 Rheumatic mitral valve disease, unspecified: Secondary | ICD-10-CM

## 2013-02-05 DIAGNOSIS — I2510 Atherosclerotic heart disease of native coronary artery without angina pectoris: Secondary | ICD-10-CM

## 2013-02-05 HISTORY — PX: ENDOVEIN HARVEST OF GREATER SAPHENOUS VEIN: SHX5059

## 2013-02-05 HISTORY — PX: MITRAL VALVE REPLACEMENT (MVR)/CORONARY ARTERY BYPASS GRAFTING (CABG): SHX5984

## 2013-02-05 HISTORY — PX: PATENT FORAMEN OVALE CLOSURE: SHX5483

## 2013-02-05 HISTORY — PX: INTRAOPERATIVE TRANSESOPHAGEAL ECHOCARDIOGRAM: SHX5062

## 2013-02-05 LAB — CBC
HCT: 25.9 % — ABNORMAL LOW (ref 39.0–52.0)
Hemoglobin: 8.9 g/dL — ABNORMAL LOW (ref 13.0–17.0)
MCH: 30.7 pg (ref 26.0–34.0)
MCHC: 32.9 g/dL (ref 30.0–36.0)
MCV: 93.4 fL (ref 78.0–100.0)
MCV: 91.2 fL (ref 78.0–100.0)
Platelets: 319 10*3/uL (ref 150–400)
RBC: 3.91 MIL/uL — ABNORMAL LOW (ref 4.22–5.81)
RBC: 2.84 MIL/uL — ABNORMAL LOW (ref 4.22–5.81)
RDW: 13.1 % (ref 11.5–15.5)
WBC: 25.3 10*3/uL — ABNORMAL HIGH (ref 4.0–10.5)

## 2013-02-05 LAB — HEMOGLOBIN AND HEMATOCRIT, BLOOD
HCT: 23.4 % — ABNORMAL LOW (ref 39.0–52.0)
Hemoglobin: 8 g/dL — ABNORMAL LOW (ref 13.0–17.0)

## 2013-02-05 LAB — POCT I-STAT 3, ART BLOOD GAS (G3+)
Acid-Base Excess: 9 mmol/L — ABNORMAL HIGH (ref 0.0–2.0)
Acid-Base Excess: 5 mmol/L — ABNORMAL HIGH (ref 0.0–2.0)
Acid-base deficit: 2 mmol/L (ref 0.0–2.0)
Bicarbonate: 34.3 mEq/L — ABNORMAL HIGH (ref 20.0–24.0)
Bicarbonate: 34.1 mEq/L — ABNORMAL HIGH (ref 20.0–24.0)
Bicarbonate: 23.9 mEq/L (ref 20.0–24.0)
O2 Saturation: 100 %
O2 Saturation: 87 %
O2 Saturation: 96 %
Patient temperature: 36.1
TCO2: 36 mmol/L (ref 0–100)
TCO2: 36 mmol/L (ref 0–100)
TCO2: 26 mmol/L (ref 0–100)
TCO2: 26 mmol/L (ref 0–100)
TCO2: 25 mmol/L (ref 0–100)
pCO2 arterial: 48.1 mmHg — ABNORMAL HIGH (ref 35.0–45.0)
pH, Arterial: 7.41 (ref 7.350–7.450)
pH, Arterial: 7.539 — ABNORMAL HIGH (ref 7.350–7.450)
pH, Arterial: 7.286 — ABNORMAL LOW (ref 7.350–7.450)
pH, Arterial: 7.314 — ABNORMAL LOW (ref 7.350–7.450)
pO2, Arterial: 206 mmHg — ABNORMAL HIGH (ref 80.0–100.0)
pO2, Arterial: 258 mmHg — ABNORMAL HIGH (ref 80.0–100.0)
pO2, Arterial: 54 mmHg — ABNORMAL LOW (ref 80.0–100.0)

## 2013-02-05 LAB — BASIC METABOLIC PANEL
Calcium: 9.6 mg/dL (ref 8.4–10.5)
GFR calc Af Amer: 53 mL/min — ABNORMAL LOW (ref >90)
GFR calc non Af Amer: 45 mL/min — ABNORMAL LOW (ref >90)
Glucose, Bld: 127 mg/dL — ABNORMAL HIGH (ref 70–99)
Potassium: 3.9 mEq/L (ref 3.5–5.1)
Sodium: 136 mEq/L (ref 135–145)

## 2013-02-05 LAB — POCT I-STAT 4, (NA,K, GLUC, HGB,HCT)
Glucose, Bld: 170 mg/dL — ABNORMAL HIGH (ref 70–99)
Glucose, Bld: 185 mg/dL — ABNORMAL HIGH (ref 70–99)
Glucose, Bld: 153 mg/dL — ABNORMAL HIGH (ref 70–99)
Glucose, Bld: 126 mg/dL — ABNORMAL HIGH (ref 70–99)
Glucose, Bld: 80 mg/dL (ref 70–99)
HCT: 37 % — ABNORMAL LOW (ref 39.0–52.0)
HCT: 27 % — ABNORMAL LOW (ref 39.0–52.0)
HCT: 24 % — ABNORMAL LOW (ref 39.0–52.0)
HCT: 25 % — ABNORMAL LOW (ref 39.0–52.0)
HCT: 23 % — ABNORMAL LOW (ref 39.0–52.0)
Hemoglobin: 12.6 g/dL — ABNORMAL LOW (ref 13.0–17.0)
Hemoglobin: 11.2 g/dL — ABNORMAL LOW (ref 13.0–17.0)
Hemoglobin: 8.5 g/dL — ABNORMAL LOW (ref 13.0–17.0)
Potassium: 3.5 mEq/L (ref 3.5–5.1)
Potassium: 4 mEq/L (ref 3.5–5.1)
Potassium: 4.4 mEq/L (ref 3.5–5.1)
Potassium: 4.5 mEq/L (ref 3.5–5.1)
Potassium: 4.1 mEq/L (ref 3.5–5.1)
Potassium: 4.3 mEq/L (ref 3.5–5.1)
Sodium: 135 mEq/L (ref 135–145)
Sodium: 134 mEq/L — ABNORMAL LOW (ref 135–145)
Sodium: 134 mEq/L — ABNORMAL LOW (ref 135–145)
Sodium: 135 mEq/L (ref 135–145)
Sodium: 136 mEq/L (ref 135–145)
Sodium: 138 mEq/L (ref 135–145)
Sodium: 139 mEq/L (ref 135–145)

## 2013-02-05 LAB — POCT I-STAT GLUCOSE: Glucose, Bld: 163 mg/dL — ABNORMAL HIGH (ref 70–99)

## 2013-02-05 LAB — HEMOGLOBIN A1C: Hgb A1c MFr Bld: 5.3 % (ref <5.7)

## 2013-02-05 LAB — APTT: aPTT: 46 seconds — ABNORMAL HIGH (ref 24–37)

## 2013-02-05 LAB — PROTIME-INR: INR: 2.01 — ABNORMAL HIGH (ref 0.00–1.49)

## 2013-02-05 SURGERY — ECHOCARDIOGRAM, TRANSESOPHAGEAL, INTRAOPERATIVE
Anesthesia: General | Site: Leg Upper | Laterality: Right

## 2013-02-05 MED ORDER — AMIODARONE IV BOLUS ONLY 150 MG/100ML: 150.0000 mg | Freq: Once | INTRAVENOUS | Status: DC

## 2013-02-05 MED ORDER — OXYCODONE HCL 5 MG PO TABS
5.0000 mg | ORAL_TABLET | ORAL | Status: DC | PRN
  Administered 2013-02-10 – 2013-02-11 (×7): 10 mg via ORAL
  Administered 2013-02-12: 5 mg via ORAL
  Administered 2013-02-12 – 2013-02-16 (×7): 10 mg via ORAL
  Administered 2013-02-16: 5 mg via ORAL
  Administered 2013-02-16 – 2013-02-17 (×2): 10 mg via ORAL
  Filled 2013-02-05 (×2): qty 2
  Filled 2013-02-05: qty 1
  Filled 2013-02-05 (×3): qty 2
  Filled 2013-02-05: qty 1
  Filled 2013-02-05 (×11): qty 2

## 2013-02-05 MED ORDER — ALBUMIN HUMAN 5 % IV SOLN
INTRAVENOUS | Status: DC | PRN
  Administered 2013-02-05 (×3): via INTRAVENOUS

## 2013-02-05 MED ORDER — SODIUM CHLORIDE 0.45 % IV SOLN
INTRAVENOUS | Status: DC
  Administered 2013-02-12: 20 mL/h via INTRAVENOUS

## 2013-02-05 MED ORDER — AMIODARONE LOAD VIA INFUSION
150.0000 mg | Freq: Once | INTRAVENOUS | Status: AC
  Administered 2013-02-05: 150 mg via INTRAVENOUS

## 2013-02-05 MED ORDER — LIDOCAINE HCL (CARDIAC) 20 MG/ML IV SOLN
INTRAVENOUS | Status: AC
  Filled 2013-02-05: qty 5

## 2013-02-05 MED ORDER — DEXMEDETOMIDINE HCL IN NACL 400 MCG/100ML IV SOLN
0.4000 ug/kg/h | INTRAVENOUS | Status: AC
  Filled 2013-02-05: qty 100

## 2013-02-05 MED ORDER — PHENYLEPHRINE HCL 10 MG/ML IJ SOLN
0.0000 ug/min | INTRAVENOUS | Status: DC
  Administered 2013-02-05: 100 ug/min via INTRAVENOUS
  Filled 2013-02-05 (×3): qty 2

## 2013-02-05 MED ORDER — NITROGLYCERIN IN D5W 200-5 MCG/ML-% IV SOLN: 0.0000 ug/min | INTRAVENOUS | Status: DC

## 2013-02-05 MED ORDER — MAGNESIUM SULFATE 40 MG/ML IJ SOLN
4.0000 g | Freq: Once | INTRAMUSCULAR | Status: AC
  Administered 2013-02-05: 4 g via INTRAVENOUS

## 2013-02-05 MED ORDER — SODIUM CHLORIDE 0.9 % IV SOLN
INTRAVENOUS | Status: DC | PRN
  Administered 2013-02-05: 18:00:00 via INTRAVENOUS

## 2013-02-05 MED ORDER — ALBUMIN HUMAN 5 % IV SOLN
250.0000 mL | INTRAVENOUS | Status: AC | PRN
  Administered 2013-02-05 – 2013-02-06 (×4): 250 mL via INTRAVENOUS
  Filled 2013-02-05 (×3): qty 250

## 2013-02-05 MED ORDER — MAGNESIUM SULFATE 40 MG/ML IJ SOLN
2.0000 g | Freq: Once | INTRAMUSCULAR | Status: DC
  Filled 2013-02-05: qty 50

## 2013-02-05 MED ORDER — ACETAMINOPHEN 160 MG/5ML PO SOLN
975.0000 mg | Freq: Four times a day (QID) | ORAL | Status: AC
  Administered 2013-02-06 – 2013-02-09 (×11): 975 mg
  Filled 2013-02-05: qty 40.6
  Filled 2013-02-05: qty 20.3
  Filled 2013-02-05 (×5): qty 40.6
  Filled 2013-02-05: qty 20.3
  Filled 2013-02-05 (×3): qty 40.6

## 2013-02-05 MED ORDER — DOCUSATE SODIUM 100 MG PO CAPS: 200.0000 mg | ORAL_CAPSULE | Freq: Every day | ORAL | Status: DC

## 2013-02-05 MED ORDER — LACTATED RINGERS IV SOLN: 500.0000 mL | Freq: Once | INTRAVENOUS | Status: AC | PRN

## 2013-02-05 MED ORDER — DEXTROSE 5 % IV SOLN
1.5000 g | Freq: Two times a day (BID) | INTRAVENOUS | Status: AC
  Administered 2013-02-05 – 2013-02-07 (×4): 1.5 g via INTRAVENOUS
  Filled 2013-02-05 (×4): qty 1.5

## 2013-02-05 MED ORDER — BISACODYL 5 MG PO TBEC
10.0000 mg | DELAYED_RELEASE_TABLET | Freq: Every day | ORAL | Status: DC
  Administered 2013-02-10 – 2013-02-13 (×3): 10 mg via ORAL
  Filled 2013-02-05 (×4): qty 2

## 2013-02-05 MED ORDER — MILRINONE IN DEXTROSE 20 MG/100ML IV SOLN
0.1250 ug/kg/min | Freq: Once | INTRAVENOUS | Status: AC
  Administered 2013-02-05: .3 ug/kg/min via INTRAVENOUS
  Filled 2013-02-05: qty 100

## 2013-02-05 MED ORDER — POTASSIUM CHLORIDE 10 MEQ/50ML IV SOLN: 10.0000 meq | INTRAVENOUS | Status: AC

## 2013-02-05 MED ORDER — ACETAMINOPHEN 10 MG/ML IV SOLN
1000.0000 mg | Freq: Once | INTRAVENOUS | Status: AC
  Administered 2013-02-05: 1000 mg via INTRAVENOUS

## 2013-02-05 MED ORDER — FAMOTIDINE IN NACL 20-0.9 MG/50ML-% IV SOLN
20.0000 mg | Freq: Two times a day (BID) | INTRAVENOUS | Status: AC
  Administered 2013-02-05 – 2013-02-06 (×2): 20 mg via INTRAVENOUS
  Filled 2013-02-05: qty 50

## 2013-02-05 MED ORDER — VANCOMYCIN HCL IN DEXTROSE 1-5 GM/200ML-% IV SOLN
1000.0000 mg | Freq: Once | INTRAVENOUS | Status: AC
  Administered 2013-02-05: 1000 mg via INTRAVENOUS
  Filled 2013-02-05: qty 200

## 2013-02-05 MED ORDER — NOREPINEPHRINE BITARTRATE 1 MG/ML IJ SOLN
2.0000 ug/min | INTRAVENOUS | Status: AC
  Administered 2013-02-06: 6 ug/min via INTRAVENOUS
  Filled 2013-02-05 (×2): qty 4

## 2013-02-05 MED ORDER — HEPARIN SODIUM (PORCINE) 1000 UNIT/ML IJ SOLN
INTRAMUSCULAR | Status: DC | PRN
  Administered 2013-02-05: 50000 [IU] via INTRAVENOUS

## 2013-02-05 MED ORDER — SODIUM CHLORIDE 0.9 % IJ SOLN
OROMUCOSAL | Status: DC | PRN
  Administered 2013-02-05: 09:00:00 via TOPICAL

## 2013-02-05 MED ORDER — MILRINONE IN DEXTROSE 20 MG/100ML IV SOLN
0.3000 ug/kg/min | INTRAVENOUS | Status: DC
  Administered 2013-02-06 – 2013-02-08 (×7): 0.3 ug/kg/min via INTRAVENOUS
  Filled 2013-02-05 (×7): qty 100

## 2013-02-05 MED ORDER — METOPROLOL TARTRATE 1 MG/ML IV SOLN: 2.5000 mg | INTRAVENOUS | Status: DC | PRN

## 2013-02-05 MED ORDER — ASPIRIN EC 325 MG PO TBEC
325.0000 mg | DELAYED_RELEASE_TABLET | Freq: Every day | ORAL | Status: DC
  Filled 2013-02-05 (×4): qty 1

## 2013-02-05 MED ORDER — SODIUM CHLORIDE 0.9 % IV SOLN
INTRAVENOUS | Status: DC
  Administered 2013-02-09: 06:00:00 via INTRAVENOUS

## 2013-02-05 MED ORDER — AMIODARONE HCL IN DEXTROSE 360-4.14 MG/200ML-% IV SOLN
30.0000 mg/h | INTRAVENOUS | Status: DC
  Filled 2013-02-05 (×3): qty 200

## 2013-02-05 MED ORDER — LACTATED RINGERS IV SOLN
INTRAVENOUS | Status: DC | PRN
  Administered 2013-02-05: 07:00:00 via INTRAVENOUS

## 2013-02-05 MED ORDER — SODIUM CHLORIDE 0.9 % IJ SOLN
3.0000 mL | INTRAMUSCULAR | Status: DC | PRN
  Administered 2013-02-10: 3 mL via INTRAVENOUS

## 2013-02-05 MED ORDER — MIDAZOLAM HCL 2 MG/2ML IJ SOLN
2.0000 mg | INTRAMUSCULAR | Status: DC | PRN
  Administered 2013-02-05 – 2013-02-10 (×11): 2 mg via INTRAVENOUS
  Filled 2013-02-05 (×12): qty 2

## 2013-02-05 MED ORDER — PROTAMINE SULFATE 10 MG/ML IV SOLN
INTRAVENOUS | Status: DC | PRN
  Administered 2013-02-05 (×2): 30 mg via INTRAVENOUS
  Administered 2013-02-05: 10 mg via INTRAVENOUS
  Administered 2013-02-05: 50 mg via INTRAVENOUS
  Administered 2013-02-05: 30 mg via INTRAVENOUS
  Administered 2013-02-05 (×2): 100 mg via INTRAVENOUS

## 2013-02-05 MED ORDER — PANTOPRAZOLE SODIUM 40 MG PO TBEC: 40.0000 mg | DELAYED_RELEASE_TABLET | Freq: Every day | ORAL | Status: DC

## 2013-02-05 MED ORDER — AMIODARONE LOAD VIA INFUSION
150.0000 mg | Freq: Once | INTRAVENOUS | Status: DC
  Filled 2013-02-05: qty 83.34

## 2013-02-05 MED ORDER — METOPROLOL TARTRATE 12.5 MG HALF TABLET
12.5000 mg | ORAL_TABLET | Freq: Two times a day (BID) | ORAL | Status: DC
  Administered 2013-02-10 – 2013-02-12 (×4): 12.5 mg via ORAL
  Filled 2013-02-05 (×17): qty 1

## 2013-02-05 MED ORDER — METOPROLOL TARTRATE 25 MG/10 ML ORAL SUSPENSION
12.5000 mg | Freq: Two times a day (BID) | ORAL | Status: DC
  Administered 2013-02-07 – 2013-02-09 (×2): 12.5 mg
  Filled 2013-02-05 (×13): qty 5

## 2013-02-05 MED ORDER — MAGNESIUM SULFATE 40 MG/ML IJ SOLN
INTRAMUSCULAR | Status: AC
  Administered 2013-02-05: 4 g
  Filled 2013-02-05: qty 100

## 2013-02-05 MED ORDER — ROCURONIUM BROMIDE 100 MG/10ML IV SOLN
INTRAVENOUS | Status: DC | PRN
  Administered 2013-02-05: 50 mg via INTRAVENOUS

## 2013-02-05 MED ORDER — MORPHINE SULFATE 2 MG/ML IJ SOLN
2.0000 mg | INTRAMUSCULAR | Status: DC | PRN
  Administered 2013-02-05: 2 mg via INTRAVENOUS
  Administered 2013-02-06 (×2): 4 mg via INTRAVENOUS
  Administered 2013-02-06: 2 mg via INTRAVENOUS
  Administered 2013-02-06: 4 mg via INTRAVENOUS
  Administered 2013-02-06: 2 mg via INTRAVENOUS
  Administered 2013-02-06 – 2013-02-08 (×15): 4 mg via INTRAVENOUS
  Administered 2013-02-09 (×9): 2 mg via INTRAVENOUS
  Administered 2013-02-10: 4 mg via INTRAVENOUS
  Administered 2013-02-10 (×2): 2 mg via INTRAVENOUS
  Administered 2013-02-10 – 2013-02-11 (×2): 4 mg via INTRAVENOUS
  Administered 2013-02-11 (×2): 2 mg via INTRAVENOUS
  Filled 2013-02-05: qty 2
  Filled 2013-02-05 (×2): qty 1
  Filled 2013-02-05 (×5): qty 2
  Filled 2013-02-05: qty 1
  Filled 2013-02-05 (×2): qty 2
  Filled 2013-02-05 (×2): qty 1
  Filled 2013-02-05 (×2): qty 2
  Filled 2013-02-05: qty 1
  Filled 2013-02-05 (×5): qty 2
  Filled 2013-02-05: qty 1
  Filled 2013-02-05 (×2): qty 2
  Filled 2013-02-05: qty 1
  Filled 2013-02-05: qty 2
  Filled 2013-02-05 (×3): qty 1
  Filled 2013-02-05: qty 2
  Filled 2013-02-05: qty 1
  Filled 2013-02-05: qty 2
  Filled 2013-02-05 (×2): qty 1
  Filled 2013-02-05: qty 2
  Filled 2013-02-05: qty 1
  Filled 2013-02-05 (×4): qty 2

## 2013-02-05 MED ORDER — PROPOFOL 10 MG/ML IV BOLUS
INTRAVENOUS | Status: DC | PRN
  Administered 2013-02-05: 30 mg via INTRAVENOUS

## 2013-02-05 MED ORDER — LACTATED RINGERS IV SOLN: INTRAVENOUS | Status: DC

## 2013-02-05 MED ORDER — ACETAMINOPHEN 500 MG PO TABS
1000.0000 mg | ORAL_TABLET | Freq: Four times a day (QID) | ORAL | Status: AC
  Administered 2013-02-07 – 2013-02-10 (×7): 1000 mg via ORAL
  Filled 2013-02-05 (×19): qty 2

## 2013-02-05 MED ORDER — SODIUM CHLORIDE 0.9 % IV SOLN
INTRAVENOUS | Status: DC
  Administered 2013-02-05: 23:00:00 via INTRAVENOUS
  Filled 2013-02-05 (×2): qty 1

## 2013-02-05 MED ORDER — MORPHINE SULFATE 2 MG/ML IJ SOLN: 1.0000 mg | INTRAMUSCULAR | Status: AC | PRN

## 2013-02-05 MED ORDER — BISACODYL 10 MG RE SUPP
10.0000 mg | Freq: Every day | RECTAL | Status: DC
  Administered 2013-02-09: 10 mg via RECTAL
  Filled 2013-02-05: qty 1

## 2013-02-05 MED ORDER — DOPAMINE-DEXTROSE 3.2-5 MG/ML-% IV SOLN
0.0000 ug/kg/min | INTRAVENOUS | Status: AC
  Administered 2013-02-06: 10 ug/kg/min via INTRAVENOUS
  Filled 2013-02-05: qty 250

## 2013-02-05 MED ORDER — METOCLOPRAMIDE HCL 5 MG/ML IJ SOLN
10.0000 mg | Freq: Four times a day (QID) | INTRAMUSCULAR | Status: AC
  Administered 2013-02-05 – 2013-02-06 (×3): 10 mg via INTRAVENOUS
  Filled 2013-02-05 (×4): qty 2

## 2013-02-05 MED ORDER — 0.9 % SODIUM CHLORIDE (POUR BTL) OPTIME
TOPICAL | Status: DC | PRN
  Administered 2013-02-05: 1000 mL

## 2013-02-05 MED ORDER — HEMOSTATIC AGENTS (NO CHARGE) OPTIME
TOPICAL | Status: DC | PRN
  Administered 2013-02-05: 1 via TOPICAL

## 2013-02-05 MED ORDER — PHENYLEPHRINE HCL 10 MG/ML IJ SOLN
30.0000 ug/min | INTRAVENOUS | Status: DC
  Filled 2013-02-05: qty 2

## 2013-02-05 MED ORDER — VECURONIUM BROMIDE 10 MG IV SOLR
INTRAVENOUS | Status: DC | PRN
  Administered 2013-02-05 (×2): 3 mg via INTRAVENOUS
  Administered 2013-02-05: 2 mg via INTRAVENOUS
  Administered 2013-02-05: 5 mg via INTRAVENOUS
  Administered 2013-02-05: 2 mg via INTRAVENOUS
  Administered 2013-02-05: 5 mg via INTRAVENOUS
  Administered 2013-02-05: 2 mg via INTRAVENOUS
  Administered 2013-02-05 (×2): 5 mg via INTRAVENOUS

## 2013-02-05 MED ORDER — AMIODARONE HCL IN DEXTROSE 360-4.14 MG/200ML-% IV SOLN
30.0000 mg/h | INTRAVENOUS | Status: DC
  Administered 2013-02-05: 30 mg/h via INTRAVENOUS
  Filled 2013-02-05: qty 200

## 2013-02-05 MED ORDER — AMIODARONE HCL IN DEXTROSE 360-4.14 MG/200ML-% IV SOLN
INTRAVENOUS | Status: AC
  Administered 2013-02-05: 60 mg/h via INTRAVENOUS
  Filled 2013-02-05: qty 200

## 2013-02-05 MED ORDER — ONDANSETRON HCL 4 MG/2ML IJ SOLN
4.0000 mg | Freq: Four times a day (QID) | INTRAMUSCULAR | Status: DC | PRN
  Administered 2013-02-06 – 2013-02-12 (×6): 4 mg via INTRAVENOUS
  Filled 2013-02-05 (×6): qty 2

## 2013-02-05 MED ORDER — SODIUM CHLORIDE 0.9 % IV SOLN
250.0000 mL | INTRAVENOUS | Status: DC
  Administered 2013-02-08: 250 mL via INTRAVENOUS

## 2013-02-05 MED ORDER — SODIUM CHLORIDE 0.9 % IJ SOLN
3.0000 mL | Freq: Two times a day (BID) | INTRAMUSCULAR | Status: DC
  Administered 2013-02-06 – 2013-02-10 (×9): 3 mL via INTRAVENOUS
  Administered 2013-02-11: 21:00:00 via INTRAVENOUS
  Administered 2013-02-11 – 2013-02-13 (×4): 3 mL via INTRAVENOUS

## 2013-02-05 MED ORDER — ASPIRIN 81 MG PO CHEW
324.0000 mg | CHEWABLE_TABLET | Freq: Every day | ORAL | Status: DC
  Administered 2013-02-07: 324 mg
  Filled 2013-02-05: qty 4

## 2013-02-05 MED ORDER — INSULIN REGULAR BOLUS VIA INFUSION
0.0000 [IU] | Freq: Three times a day (TID) | INTRAVENOUS | Status: DC
Start: 2013-02-06 — End: 2013-02-06
  Filled 2013-02-05: qty 10

## 2013-02-05 MED ORDER — FENTANYL CITRATE 0.05 MG/ML IJ SOLN
INTRAMUSCULAR | Status: DC | PRN
  Administered 2013-02-05 (×2): 250 ug via INTRAVENOUS
  Administered 2013-02-05: 750 ug via INTRAVENOUS

## 2013-02-05 MED ORDER — AMIODARONE HCL IN DEXTROSE 360-4.14 MG/200ML-% IV SOLN
INTRAVENOUS | Status: AC
  Administered 2013-02-05: 200 mL via INTRAVENOUS
  Filled 2013-02-05: qty 200

## 2013-02-05 MED ORDER — CALCIUM CHLORIDE 10 % IV SOLN
1.0000 g | Freq: Once | INTRAVENOUS | Status: AC
  Administered 2013-02-05: 1 g via INTRAVENOUS

## 2013-02-05 MED ORDER — DEXMEDETOMIDINE HCL IN NACL 200 MCG/50ML IV SOLN
0.1000 ug/kg/h | INTRAVENOUS | Status: DC
  Administered 2013-02-06 – 2013-02-08 (×21): 0.7 ug/kg/h via INTRAVENOUS
  Filled 2013-02-05 (×23): qty 50

## 2013-02-05 MED ORDER — LIDOCAINE IN D5W 4-5 MG/ML-% IV SOLN
1.0000 mg/min | INTRAVENOUS | Status: DC
  Administered 2013-02-05: 1 mg/min via INTRAVENOUS
  Filled 2013-02-05: qty 500

## 2013-02-05 MED ORDER — MIDAZOLAM HCL 5 MG/5ML IJ SOLN
INTRAMUSCULAR | Status: DC | PRN
  Administered 2013-02-05: 5 mg via INTRAVENOUS
  Administered 2013-02-05 (×2): 2 mg via INTRAVENOUS
  Administered 2013-02-05 (×2): 3 mg via INTRAVENOUS

## 2013-02-05 MED ORDER — AMIODARONE HCL IN DEXTROSE 360-4.14 MG/200ML-% IV SOLN: 60.0000 mg/h | INTRAVENOUS | Status: DC

## 2013-02-05 SURGICAL SUPPLY — 142 items
ADAPTER CARDIO PERF ANTE/RETRO (ADAPTER) ×4 IMPLANT
ANTEGRADE CPLG (MISCELLANEOUS) ×4 IMPLANT
ATTRACTOMAT 16X20 MAGNETIC DRP (DRAPES) ×4 IMPLANT
BAG DECANTER FOR FLEXI CONT (MISCELLANEOUS) ×4 IMPLANT
BANDAGE ELASTIC 4 VELCRO ST LF (GAUZE/BANDAGES/DRESSINGS) ×4 IMPLANT
BANDAGE ELASTIC 6 VELCRO ST LF (GAUZE/BANDAGES/DRESSINGS) ×4 IMPLANT
BANDAGE GAUZE ELAST BULKY 4 IN (GAUZE/BANDAGES/DRESSINGS) ×4 IMPLANT
BASKET HEART (ORDER IN 25'S) (MISCELLANEOUS) ×1
BASKET HEART (ORDER IN 25S) (MISCELLANEOUS) ×3 IMPLANT
BLADE STERNUM SYSTEM 6 (BLADE) ×4 IMPLANT
BLADE SURG 15 STRL LF DISP TIS (BLADE) ×9 IMPLANT
BLADE SURG 15 STRL SS (BLADE) ×3
CANISTER SUCTION 2500CC (MISCELLANEOUS) ×4 IMPLANT
CANNULA ARTERIAL NVNT 3/8 22FR (MISCELLANEOUS) IMPLANT
CANNULA EZ GLIDE AORTIC 21FR (CANNULA) ×4 IMPLANT
CANNULA GUNDRY RCSP 15FR (MISCELLANEOUS) ×4 IMPLANT
CANNULA VEN 1 STAGE STR 66236 (MISCELLANEOUS) IMPLANT
CANNULA VRC MALB SNGL STG 28FR (MISCELLANEOUS) ×3 IMPLANT
CANNULA VRC MALB SNGL STG 36FR (MISCELLANEOUS) ×3 IMPLANT
CATH CPB KIT HENDRICKSON (MISCELLANEOUS) ×4 IMPLANT
CATH ROBINSON RED A/P 18FR (CATHETERS) ×12 IMPLANT
CATH THORACIC 36FR (CATHETERS) ×4 IMPLANT
CATH THORACIC 36FR RT ANG (CATHETERS) ×8 IMPLANT
CLIP FOGARTY SPRING 6M (CLIP) IMPLANT
CLIP TI MEDIUM 24 (CLIP) IMPLANT
CLIP TI WIDE RED SMALL 24 (CLIP) ×8 IMPLANT
CLOTH BEACON ORANGE TIMEOUT ST (SAFETY) ×4 IMPLANT
CONN 1/2X1/2X1/2  BEN (MISCELLANEOUS) ×1
CONN 1/2X1/2X1/2 BEN (MISCELLANEOUS) ×3 IMPLANT
CONN 3/8X1/2 ST GISH (MISCELLANEOUS) ×8 IMPLANT
CONN ST 1/2X1/2  BEN (MISCELLANEOUS) ×1
CONN ST 1/2X1/2 BEN (MISCELLANEOUS) ×3 IMPLANT
CONN Y 3/8X3/8X3/8  BEN (MISCELLANEOUS) ×1
CONN Y 3/8X3/8X3/8 BEN (MISCELLANEOUS) ×3 IMPLANT
CONT SPEC 4OZ CLIKSEAL STRL BL (MISCELLANEOUS) ×8 IMPLANT
COVER MAYO STAND STRL (DRAPES) ×4 IMPLANT
COVER SURGICAL LIGHT HANDLE (MISCELLANEOUS) ×8 IMPLANT
CRADLE DONUT ADULT HEAD (MISCELLANEOUS) ×4 IMPLANT
DRAPE CARDIOVASCULAR INCISE (DRAPES) ×1
DRAPE SLUSH/WARMER DISC (DRAPES) ×4 IMPLANT
DRAPE SRG 135X102X78XABS (DRAPES) ×3 IMPLANT
DRSG COVADERM 4X14 (GAUZE/BANDAGES/DRESSINGS) ×4 IMPLANT
ELECT CAUTERY BLADE 6.4 (BLADE) IMPLANT
ELECT REM PT RETURN 9FT ADLT (ELECTROSURGICAL) ×8
ELECTRODE REM PT RTRN 9FT ADLT (ELECTROSURGICAL) ×6 IMPLANT
GLOVE BIO SURGEON STRL SZ 6 (GLOVE) ×32 IMPLANT
GLOVE BIO SURGEON STRL SZ 6.5 (GLOVE) ×16 IMPLANT
GLOVE BIOGEL PI IND STRL 6 (GLOVE) ×3 IMPLANT
GLOVE BIOGEL PI IND STRL 6.5 (GLOVE) ×12 IMPLANT
GLOVE BIOGEL PI IND STRL 7.0 (GLOVE) ×12 IMPLANT
GLOVE BIOGEL PI INDICATOR 6 (GLOVE) ×1
GLOVE BIOGEL PI INDICATOR 6.5 (GLOVE) ×4
GLOVE BIOGEL PI INDICATOR 7.0 (GLOVE) ×4
GLOVE EUDERMIC 7 POWDERFREE (GLOVE) ×16 IMPLANT
GOWN PREVENTION PLUS XLARGE (GOWN DISPOSABLE) ×8 IMPLANT
GOWN STRL NON-REIN LRG LVL3 (GOWN DISPOSABLE) ×40 IMPLANT
HEMOSTAT POWDER SURGIFOAM 1G (HEMOSTASIS) ×12 IMPLANT
HEMOSTAT SURGICEL 2X14 (HEMOSTASIS) ×4 IMPLANT
INSERT FOGARTY XLG (MISCELLANEOUS) ×4 IMPLANT
KIT BASIN OR (CUSTOM PROCEDURE TRAY) ×4 IMPLANT
KIT PAIN CUSTOM (MISCELLANEOUS) IMPLANT
KIT ROOM TURNOVER OR (KITS) ×4 IMPLANT
KIT SUCTION CATH 14FR (SUCTIONS) ×12 IMPLANT
KIT VASOVIEW W/TROCAR VH 2000 (KITS) ×4 IMPLANT
LOOP VESSEL SUPERMAXI WHITE (MISCELLANEOUS) ×4 IMPLANT
MARKER GRAFT CORONARY BYPASS (MISCELLANEOUS) ×12 IMPLANT
NDL SUT 1 .5 CRC FRENCH EYE (NEEDLE) ×3 IMPLANT
NEEDLE FRENCH EYE (NEEDLE) ×1
NS IRRIG 1000ML POUR BTL (IV SOLUTION) ×56 IMPLANT
PACK OPEN HEART (CUSTOM PROCEDURE TRAY) ×4 IMPLANT
PAD ARMBOARD 7.5X6 YLW CONV (MISCELLANEOUS) ×8 IMPLANT
PAD SHARPS MAGNETIC DISPOSAL (MISCELLANEOUS) ×4 IMPLANT
PENCIL BUTTON HOLSTER BLD 10FT (ELECTRODE) ×4 IMPLANT
PUNCH AORTIC ROTATE 4.0MM (MISCELLANEOUS) IMPLANT
PUNCH AORTIC ROTATE 4.5MM 8IN (MISCELLANEOUS) ×4 IMPLANT
PUNCH AORTIC ROTATE 5MM 8IN (MISCELLANEOUS) IMPLANT
RING MITRAL MEMO 3D 32MM SMD32 (Prosthesis & Implant Heart) ×4 IMPLANT
SPONGE GAUZE 4X4 12PLY (GAUZE/BANDAGES/DRESSINGS) ×8 IMPLANT
SPONGE LAP 18X18 X RAY DECT (DISPOSABLE) ×4 IMPLANT
SPONGE LAP 4X18 X RAY DECT (DISPOSABLE) ×4 IMPLANT
SUCKER INTRACARDIAC WEIGHTED (SUCKER) ×4 IMPLANT
SUCKER WEIGHTED FLEX (MISCELLANEOUS) ×4 IMPLANT
SUT BONE WAX W31G (SUTURE) ×4 IMPLANT
SUT ETHIBON 2 0 V 52N 30 (SUTURE) ×12 IMPLANT
SUT ETHIBOND 2 0 SH (SUTURE) ×19 IMPLANT
SUT ETHIBOND 2 0 SH 36X2 (SUTURE) ×9 IMPLANT
SUT ETHIBOND 2 0 V4 (SUTURE) IMPLANT
SUT ETHIBOND 2 0V4 GREEN (SUTURE) IMPLANT
SUT ETHIBOND 4 0 RB 1 (SUTURE) ×4 IMPLANT
SUT GORETEX CV4 TH-18 (SUTURE) ×28 IMPLANT
SUT MNCRL AB 4-0 PS2 18 (SUTURE) IMPLANT
SUT PROLENE 3 0 SH DA (SUTURE) ×4 IMPLANT
SUT PROLENE 3 0 SH1 36 (SUTURE) IMPLANT
SUT PROLENE 4 0 RB 1 (SUTURE) ×12
SUT PROLENE 4 0 SH DA (SUTURE) ×16 IMPLANT
SUT PROLENE 4-0 RB1 .5 CRCL 36 (SUTURE) ×36 IMPLANT
SUT PROLENE 5 0 C 1 36 (SUTURE) ×8 IMPLANT
SUT PROLENE 5 0 CC1 (SUTURE) IMPLANT
SUT PROLENE 6 0 C 1 30 (SUTURE) ×8 IMPLANT
SUT PROLENE 7 0 BV 1 (SUTURE) ×12 IMPLANT
SUT PROLENE 7 0 BV1 MDA (SUTURE) ×4 IMPLANT
SUT PROLENE 8 0 BV175 6 (SUTURE) IMPLANT
SUT SILK  1 MH (SUTURE) ×3
SUT SILK 1 MH (SUTURE) ×9 IMPLANT
SUT SILK 1 TIES 10X30 (SUTURE) ×4 IMPLANT
SUT SILK 2 0 (SUTURE)
SUT SILK 2 0 SH CR/8 (SUTURE) ×4 IMPLANT
SUT SILK 2-0 18XBRD TIE 12 (SUTURE) IMPLANT
SUT SILK 3 0 SH CR/8 (SUTURE) IMPLANT
SUT SILK 4 0 (SUTURE)
SUT SILK 4-0 18XBRD TIE 12 (SUTURE) IMPLANT
SUT STEEL 6MS V (SUTURE) IMPLANT
SUT STEEL STERNAL CCS#1 18IN (SUTURE) IMPLANT
SUT STEEL SZ 6 DBL 3X14 BALL (SUTURE) ×12 IMPLANT
SUT TEM PAC WIRE 2 0 SH (SUTURE) IMPLANT
SUT VIC AB 1 CTX 36 (SUTURE) ×2
SUT VIC AB 1 CTX36XBRD ANBCTR (SUTURE) ×6 IMPLANT
SUT VIC AB 2-0 CT1 27 (SUTURE) ×1
SUT VIC AB 2-0 CT1 TAPERPNT 27 (SUTURE) ×3 IMPLANT
SUT VIC AB 2-0 CTX 27 (SUTURE) IMPLANT
SUT VIC AB 3-0 SH 27 (SUTURE)
SUT VIC AB 3-0 SH 27X BRD (SUTURE) IMPLANT
SUT VIC AB 3-0 X1 27 (SUTURE) IMPLANT
SUT VICRYL 4-0 PS2 18IN ABS (SUTURE) IMPLANT
SUTURE E-PAK OPEN HEART (SUTURE) ×4 IMPLANT
SYR 5ML LL (SYRINGE) ×4 IMPLANT
SYR 5ML LUER SLIP (SYRINGE) ×4 IMPLANT
SYR BULB IRRIGATION 50ML (SYRINGE) ×8 IMPLANT
SYSTEM SAHARA CHEST DRAIN ATS (WOUND CARE) ×4 IMPLANT
Standard cor-knot device ×4 IMPLANT
TAPE CLOTH SURG 4X10 WHT LF (GAUZE/BANDAGES/DRESSINGS) ×4 IMPLANT
TAPE PAPER 2X10 WHT MICROPORE (GAUZE/BANDAGES/DRESSINGS) ×4 IMPLANT
TOWEL OR 17X24 6PK STRL BLUE (TOWEL DISPOSABLE) ×12 IMPLANT
TOWEL OR 17X26 10 PK STRL BLUE (TOWEL DISPOSABLE) ×8 IMPLANT
TRAY FOLEY IC TEMP SENS 14FR (CATHETERS) ×4 IMPLANT
TUBE FEEDING 8FR 16IN STR KANG (MISCELLANEOUS) ×4 IMPLANT
TUBING INSUFFLATION 10FT LAP (TUBING) ×4 IMPLANT
UNDERPAD 30X30 INCONTINENT (UNDERPADS AND DIAPERS) ×4 IMPLANT
VALVE MAGNA MITRAL 33MM (Prosthesis & Implant Heart) ×4 IMPLANT
VRC MALLEABLE SINGLE STG 28FR (MISCELLANEOUS) ×4
VRC MALLEABLE SINGLE STG 36FR (MISCELLANEOUS) ×4
WATER STERILE IRR 1000ML POUR (IV SOLUTION) ×8 IMPLANT

## 2013-02-05 NOTE — Progress Notes (Signed)
Pt. was sent to the OR  accompanied by nurse and OR tech.

## 2013-02-05 NOTE — Progress Notes (Signed)
Nursing  Pt on current vent settings x 2 hours.  ABG obtained.  Dr. Dorris Fetch updated and will leave on current PEEP for now. Fi02 decreased to 90%.   L Arwilda Georgia RN

## 2013-02-05 NOTE — Progress Notes (Signed)
  Echocardiogram Echocardiogram Transesophageal has been performed.  Lonnie [name] 02/05/2013, 9:12 AM

## 2013-02-05 NOTE — Anesthesia Postprocedure Evaluation (Signed)
  Anesthesia Post-op Note  Patient: Lonnie [name]  Procedure(s) Performed: Procedure(s) with comments: INTRAOPERATIVE TRANSESOPHAGEAL ECHOCARDIOGRAM (N/A) ENDOVEIN HARVEST OF GREATER SAPHENOUS VEIN (Right) PATENT FORAMEN OVALE CLOSURE (N/A) MITRAL VALVE REPLACEMENT (MVR)/CORONARY ARTERY BYPASS GRAFTING (CABG) (N/A) - x3 using right greater saphenous vein and left internal mammary.   Patient Location: ICU  Anesthesia Type:General  Level of Consciousness: Patient remains intubated per anesthesia plan  Airway and Oxygen Therapy: Patient remains intubated per anesthesia plan and Patient placed on Ventilator (see vital sign flow sheet for setting)  Post-op Pain: none  Post-op Assessment: Post-op Vital signs reviewed, Patient's Cardiovascular Status Stable, Respiratory Function Stable, Patent Airway, No signs of Nausea or vomiting and Pain level controlled  Post-op Vital Signs: stable  Complications: No apparent anesthesia complications

## 2013-02-05 NOTE — Anesthesia Preprocedure Evaluation (Signed)
Anesthesia Evaluation  Patient identified by MRN, date of birth, ID band Patient awake    Reviewed: Allergy & Precautions, H&P , NPO status , Patient's Chart, lab work & pertinent test results  History of Anesthesia Complications Negative for: history of anesthetic complications  Airway Mallampati: III TM Distance: >3 FB Neck ROM: Full    Dental  (+) Teeth Intact and Dental Advisory Given   Pulmonary former smoker,  breath sounds clear to auscultation  Pulmonary exam normal       Cardiovascular hypertension, + CAD, + Past MI and +CHF + Valvular Problems/Murmurs MR Rhythm:Regular Rate:Normal  ECHO: 45-50%, severe inferior hypokinesis   Mod MR, prolapsing anterior leaflet Cath: severe 2v (LAD, RCA) disease with severe MR   Neuro/Psych negative neurological ROS     GI/Hepatic negative GI ROS, Neg liver ROS, GERD-  Medicated and Controlled,  Endo/Other  negative endocrine ROS  Renal/GU ARFRenal disease (creat 1.53)     Musculoskeletal   Abdominal   Peds  Hematology   Anesthesia Other Findings   Reproductive/Obstetrics                           Anesthesia Physical Anesthesia Plan  ASA: III  Anesthesia Plan: General   Post-op Pain Management:    Induction: Intravenous  Airway Management Planned: Oral ETT  Additional Equipment: Arterial line, PA Cath, TEE and Ultrasound Guidance Line Placement  Intra-op Plan:   Post-operative Plan: Post-operative intubation/ventilation  Informed Consent: I have reviewed the patients History and Physical, chart, labs and discussed the procedure including the risks, benefits and alternatives for the proposed anesthesia with the patient or authorized representative who has indicated his/her understanding and acceptance.   Dental advisory given  Plan Discussed with: CRNA and Surgeon  Anesthesia Plan Comments: (Plan routine monitors, a-line, PA cath, GETA  with post op vent and TEE  )        Anesthesia Quick Evaluation

## 2013-02-05 NOTE — Progress Notes (Signed)
Pt. Had on and off episode of v-tach again and complaining of some feeling of indigestion and full, pale looking , low sbp 90's Place pt. on Zoll monitor. Dr, Terressa Koyanagi made aware and in to see pt.  Amiodarone bolus was given x2 150mg . IV and started on a drip  Later pt. converted to NSR. 12 lead EKG was done. Dr. Terressa Koyanagi updated family and plan of care. Magnesium 2grams IV bolus started as well. Will cont to monitor pt. Pt. at present  denies any discomfort. Will cont. to monitor. Dr. Terressa Koyanagi will notify Dr. Dorris Fetch about pt. status.

## 2013-02-05 NOTE — Progress Notes (Signed)
RT preformed Recruitment on pt per MD request upon returning to ICU following maintained SATS of 86-88% on 100% 10 Peep. Recruitment successful for SATs 99-100%. RT will continue to monitor.

## 2013-02-05 NOTE — Progress Notes (Signed)
[facility] BRIEF SICU PROGRESS NOTE  Day of Surgery  S/P Procedure(s) (LRB): INTRAOPERATIVE TRANSESOPHAGEAL ECHOCARDIOGRAM (N/A) ENDOVEIN HARVEST OF GREATER SAPHENOUS VEIN (Right) PATENT FORAMEN OVALE CLOSURE (N/A) MITRAL VALVE REPLACEMENT (MVR)/CORONARY ARTERY BYPASS GRAFTING (CABG) (N/A)   Sedated on vent AV paced rhythm w/ stable hemodynamics PA pressures relatively low O2 sats 90% on 100% FiO2 and copious frothy airway secretions Chest tube output low UOP adequate Labs and CXR pending  Plan: Try recruitment maneuver with vent, consider nitric oxide if oxygenation doesn't improve.  Monitor hemodynamics closely.  Otherwise routine early postop  Lonnie [name],Lonnie [name] 02/05/2013 7:37 PM

## 2013-02-05 NOTE — Progress Notes (Signed)
Pt. Had episode of v-tach again bp was low bp and symptomatic Dr. Terressa Koyanagi made aware  and in to see pt. 150 joules shock delivered by Dr. Terressa Koyanagi. Pt. Converted to NSR. BP up to SBP >90 and pt. Felt better after. Still with still short burst of v-tach. And converts to nsr.

## 2013-02-05 NOTE — Transfer of Care (Signed)
Immediate Anesthesia Transfer of Care Note  Patient: Lonnie [name]  Procedure(s) Performed: Procedure(s) with comments: INTRAOPERATIVE TRANSESOPHAGEAL ECHOCARDIOGRAM (N/A) ENDOVEIN HARVEST OF GREATER SAPHENOUS VEIN (Right) PATENT FORAMEN OVALE CLOSURE (N/A) MITRAL VALVE REPLACEMENT (MVR)/CORONARY ARTERY BYPASS GRAFTING (CABG) (N/A) - x3 using right greater saphenous vein and left internal mammary.   Patient Location: PACU and SICU  Anesthesia Type:General  Level of Consciousness: sedated and Patient remains intubated per anesthesia plan  Airway & Oxygen Therapy: Patient remains intubated per anesthesia plan and Patient placed on Ventilator (see vital sign flow sheet for setting)  Post-op Assessment: Report given to PACU RN  Post vital signs: Reviewed and stable  Complications: No apparent anesthesia complications

## 2013-02-05 NOTE — Plan of Care (Signed)
Cardiology on call note  Called to evaluate Mr. Stauber this am for recurrent runs of VT.  He has 2v CAD with severe MR with plans for surgery this am.  Repeat ecg without acute changes.  Telemetry appears to be PVT.  He is having mild chest discomfort and palpitations.  Plan: 1. Amiodarone bolus followed by infusion 2. Check stat labs for low K, MG given diuresis, give 2gm Mag IV 3. Discussed with CTS and OR staff, LB NP. 4. Patient now stable on Amiodarone, no shocks required, SR HR 80s, will proceed to OR.

## 2013-02-05 NOTE — H&P (View-Only) (Signed)
Reason for Consult: 2 vessel CAD and mitral regurgitation post MI Referring Physician: Dr. Georga Bora is an 67 y.o. male.  HPI:  67 y.o. male with cc/o SOB and chest pressure.  67 yo male with no prior cardiac history, but multiple cardiac risk factors (HTN, dyslipidemia and a strong family history of CAD) presented on 5/24 with a 4-day history of dyspnea on exertion with even minimal exertion and orthopnea and PND. He also had vague chest pressure. This was a diffuse anterior chest pressure sensation. It was non-radiating, worsened with activity, with no alleviating factors. He also had a non-productive cough, but no fevers or chills.  He presented to [facility] on 5/24 and was found to have an elevated BNP of 1862, troponin of 2.8, and bilateral opacities on CXR. An EKG showed ST elevation in III, III, aVF, and ST depression in V2-V5, I, and aVL. He was treated medically with improvement in his orthopnea and PND. An echocardiogram was done which showed some anterior leaflet prolapse but only mild MR. Today he underwent catheterization which revealed severe 2 vessel CAD. He had a large V wave, and severe MR with the LV gram.   Past Medical History  Diagnosis Date  . High cholesterol   . Hypertension   . Constipation   . Reflux   . Complication of anesthesia     "Hard time waking me up" after sedation after dental procedure    Past Surgical History  Procedure Laterality Date  . Stomach ulcer repair      Family History  Problem Relation Age of Onset  . Heart attack Mother 63  Brother (younger) died of MI  Social History:  reports that he has quit smoking. He does not have any smokeless tobacco history on file. He reports that  drinks alcohol. He reports that he does not use illicit drugs.  Allergies: No Known Allergies  Medications:  Prior to Admission:  Prescriptions prior to admission  Medication Sig Dispense Refill  . Ascorbic Acid (VITAMIN C PO) Take 1  tablet by mouth daily.      . Omega-3 Fatty Acids (FISH OIL) 1200 MG CAPS Take 1,200 mg by mouth daily.      . ranitidine (ZANTAC) 150 MG tablet Take 150 mg by mouth 2 (two) times daily.      Marland Kitchen VITAMIN E PO Take 1 capsule by mouth daily.      . Linaclotide (LINZESS) 145 MCG CAPS Take 1 capsule (145 mcg total) by mouth daily.  30 capsule  4  . lisinopril (PRINIVIL,ZESTRIL) 10 MG tablet Take 10 mg by mouth daily.      . simvastatin (ZOCOR) 10 MG tablet Take 10 mg by mouth at bedtime.        Results for orders placed during the hospital encounter of 01/31/13 (from the past 48 hour(s))  BASIC METABOLIC PANEL     Status: Abnormal   Collection Time    02/02/13  8:45 AM      Result Value Range   Sodium 137  135 - 145 mEq/L   Potassium 3.9  3.5 - 5.1 mEq/L   Chloride 95 (*) 96 - 112 mEq/L   CO2 30  19 - 32 mEq/L   Glucose, Bld 180 (*) 70 - 99 mg/dL   BUN 35 (*) 6 - 23 mg/dL   Creatinine, Ser 1.61  0.50 - 1.35 mg/dL   Calcium 9.0  8.4 - 09.6 mg/dL   GFR calc non Af  Amer 57 (*) >90 mL/min   GFR calc Af Amer 66 (*) >90 mL/min   Comment:            The eGFR has been calculated     using the CKD EPI equation.     This calculation has not been     validated in all clinical     situations.     eGFR's persistently     <90 mL/min signify     possible Chronic Kidney Disease.  BASIC METABOLIC PANEL     Status: Abnormal   Collection Time    02/03/13  3:55 AM      Result Value Range   Sodium 137  135 - 145 mEq/L   Potassium 3.7  3.5 - 5.1 mEq/L   Chloride 93 (*) 96 - 112 mEq/L   CO2 35 (*) 19 - 32 mEq/L   Glucose, Bld 116 (*) 70 - 99 mg/dL   BUN 35 (*) 6 - 23 mg/dL   Creatinine, Ser 3.08  0.50 - 1.35 mg/dL   Calcium 9.5  8.4 - 65.7 mg/dL   GFR calc non Af Amer 54 (*) >90 mL/min   GFR calc Af Amer 63 (*) >90 mL/min   Comment:            The eGFR has been calculated     using the CKD EPI equation.     This calculation has not been     validated in all clinical     situations.     eGFR's  persistently     <90 mL/min signify     possible Chronic Kidney Disease.  POCT I-STAT 3, BLOOD GAS (G3P V)     Status: Abnormal   Collection Time    02/03/13  1:27 PM      Result Value Range   pH, Ven 7.499 (*) 7.250 - 7.300   pCO2, Ven 46.2  45.0 - 50.0 mmHg   pO2, Ven 56.0 (*) 30.0 - 45.0 mmHg   Bicarbonate 35.9 (*) 20.0 - 24.0 mEq/L   TCO2 37  0 - 100 mmol/L   O2 Saturation 91.0     Acid-Base Excess 11.0 (*) 0.0 - 2.0 mmol/L   Sample type VENOUS    POCT I-STAT 3, BLOOD GAS (G3P V)     Status: Abnormal   Collection Time    02/03/13  1:34 PM      Result Value Range   pH, Ven 7.430 (*) 7.250 - 7.300   pCO2, Ven 52.2 (*) 45.0 - 50.0 mmHg   pO2, Ven 29.0 (*) 30.0 - 45.0 mmHg   Bicarbonate 34.6 (*) 20.0 - 24.0 mEq/L   TCO2 36  0 - 100 mmol/L   O2 Saturation 56.0     Acid-Base Excess 9.0 (*) 0.0 - 2.0 mmol/L   Sample type VENOUS     Comment NOTIFIED PHYSICIAN    POCT ACTIVATED CLOTTING TIME     Status: None   Collection Time    02/03/13  1:53 PM      Result Value Range   Activated Clotting Time 154      Dg Chest Port 1 View  02/02/2013   *RADIOLOGY REPORT*  Clinical Data: Shortness of breath.  PORTABLE CHEST - 1 VIEW  Comparison: 01/31/2013.  Findings: The cardiac silhouette, mediastinal and hilar contours are prominent but unchanged.  Persistent bilateral interstitial and airspace process.  No definite pleural effusions.  IMPRESSION: Persistent bilateral interstitial and airspace process.   Original  Report Authenticated By: Rudie Meyer, M.D.    Review of Systems  Constitutional: Positive for malaise/fatigue. Negative for fever and chills.  Respiratory: Positive for shortness of breath. Negative for wheezing.   Cardiovascular: Positive for chest pain and orthopnea. Negative for palpitations, claudication and leg swelling.  Gastrointestinal: Negative for nausea, vomiting and abdominal pain.  Genitourinary: Negative for dysuria and urgency.  Neurological: Positive for  weakness.  Endo/Heme/Allergies: Does not bruise/bleed easily.  All other systems reviewed and are negative.   Blood pressure 105/72, pulse 96, temperature 98.9 F (37.2 C), temperature source Oral, resp. rate 24, height 6\' 1"  (1.854 m), weight 214 lb 15.2 oz (97.5 kg), SpO2 98.00%. Physical Exam  Vitals reviewed. Constitutional: He is oriented to person, place, and time. He appears well-developed and well-nourished. No distress.  HENT:  Head: Normocephalic and atraumatic.  Eyes: EOM are normal. Pupils are equal, round, and reactive to light.  Neck: Neck supple. No thyromegaly present.  Cardiovascular: Normal rate and regular rhythm.   Murmur (2/6 systolic murmur at apex) heard. Respiratory: He has no wheezes.  Diminished BS bilateral bases, + rales  GI: Soft. There is no tenderness.  Musculoskeletal: Normal range of motion. He exhibits no edema.  Lymphadenopathy:    He has no cervical adenopathy.  Neurological: He is alert and oriented to person, place, and time. No cranial nerve deficit.  Skin: Skin is warm and dry.   ECHOCARDIOGRAM Study Conclusions  - Left ventricle: Mid and basal inferior wall hypokinesis The cavity size was mildly dilated. Wall thickness was increased in a pattern of mild LVH. The estimated ejection fraction was 50%. - Mitral valve: There appears to be a prolapsing segment to the anterior leaflet. Surprisingly degree of MR appears more mild than expected. No flail chords or papillary muscle seen Mild regurgitation. - Left atrium: The atrium was mildly dilated. - Atrial septum: Some subcostal images suggest flow across the atrial septum but I suspect this is eccentric TR. No VSD flow - Impressions: Suggest increase beta blocker Rx and repeat echo next week regarding MR Impressions:  - Suggest increase beta blocker Rx and repeat echo next week regarding MR  CARDIAC CATHETERIZATION  Hemodynamics  RA 8 mmHg  RV 42/2 mmHg  PA 47/15/35 mmHg  PCWP  30 mmHg with giant V waves  LV 82/12 mmHg . LVEDP: 26 mmHg  AO 80/57 mmHg  Oxygen saturations:  PA 56%  AO 91%  Cardiac Output (Fick) 5.04  Cardiac Index (Fick) 2.27  Aortic Valve: Peak to Peak gradient: Not significant  Pulmonary vascular resistance (PVR): Less than 1 Woods units.  Coronary angiography:  Coronary dominance: Right  Left Main: Normal in size with no significant disease.  Left Anterior Descending (LAD): Normal in size with an eccentric 95% proximal stenosis at the origin of a large first diagonal. The rest of the vessel has minor irregularities.  1st diagonal (D1): Large in size with 50-60% ostial stenosis.  2nd diagonal (D2): Small in size with minor irregularities.  3rd diagonal (D3): Very small in size.  Circumflex (LCx): Normal in size and nondominant. There is 40% tubular mid stenosis without any other obstructive disease.  1st obtuse marginal: Small in size with minor irregularities.  2nd obtuse marginal: Large in size with 30% ostial stenosis  3rd obtuse marginal: Normal in size with minor irregularities.  Ramus Intermedius: Small in size with minor irregularities.  Right Coronary Artery: Large in size and dominant. There is significant ectasia in the proximal segment. In the  midsegment, there is a 70% tubular stenosis. The vessel is subtotally occluded distally with TIMI 1 flow. It fills via faint collaterals from the left coronary arteries Left ventriculography: Left ventricular systolic function is mildly , LVEF is estimated at 45-50 % with significant inferior wall hypokinesis, there is severe mitral regurgitation  Final Conclusions:  1. Moderately elevated filling pressures with giant V waves noted on PCW pressure tracing. No significant pulmonary hypertension. Low normal cardiac output.  2. Mildly reduced LV systolic function with evidence of severe mitral regurgitation on left ventricular angiography.  3. Severe 2 vessel coronary artery disease with an occluded  distal RCA which is likely the culprit for inferior ST elevation MI. There is also severe eccentric stenosis in the proximal LAD  Recommendations:  Cardiothoracic surgery consult for mitral valve repair and CABG. A TEE will be ordered for tomorrow. continue IV Lasix   Assessment/Plan: 67 yo male with multiple CRF. Presented late in the course of an inferior MI. He has severe 2 vessel CAD and likely severe MR as well. His echo and cath were somewhat contradictory as to the degree of MR and he is going to have a TEE tomorrow to better assess the mitral valve.  In any event he definitely needs CABG for survival benefit and relief of symptoms. He likely will need a mitral repair as well.   I discussed with him the general nature of the procedure, need for general anesthesia, the use of cardiopulmonary bypass, and the incisions to be used. I discussed the expected hospital stay, overall recovery and short and long term outcomes. He understands the risks include, but are not limited to death, stroke, MI, DVT/PE, bleeding, possible need for transfusion, infections, cardiac arrhythmias, heart block requiring permanent pacemaker placement , and other organ system dysfunction including respiratory, renal, or GI complications. He accepts these risks and agrees to proceed.  Will meet with him again tomorrow to discuss the results of the echo as they relate to the planned surgery.  Tentatively plan surgery Thursday 5/29  He is on levaquin for possible pneumonia, but there is no real proof that he has pneumonia and I would not want to delay his surgery.  Jediah Horger C 02/03/2013, 5:48 PM

## 2013-02-05 NOTE — Progress Notes (Signed)
Pt. Has several episode of v-tach  x3 pt. With complains of slight indigestion and goes  Away after pt. back to NSR. Pt. Sitting in the chair back to bed will cont. To monitor . Dr. Terressa Koyanagi made aware of pts. V-tach. Ordered labs to be drawn stat and add magnesium.

## 2013-02-05 NOTE — Interval H&P Note (Signed)
History and Physical Interval Note:  Currently feels well. No CP, SOB. He had multiple episodes of VT overnight. Resolved with coughing. Did not require shock.  Will proceed with CABg, MVR this AM  Patient aware of risks and benefits  02/05/2013 6:56 AM  Lonnie [name]  has presented today for surgery, with the diagnosis of CAD MR  The various methods of treatment have been discussed with the patient and family. After consideration of risks, benefits and other options for treatment, the patient has consented to  Procedure(s): CORONARY ARTERY BYPASS GRAFTING (CABG) (N/A) MITRAL VALVE REPAIR (MVR) (N/A) INTRAOPERATIVE TRANSESOPHAGEAL ECHOCARDIOGRAM (N/A) as a surgical intervention .  The patient's history has been reviewed, patient examined, no change in status, stable for surgery.  I have reviewed the patient's chart and labs.  Questions were answered to the patient's satisfaction.     [name] C

## 2013-02-05 NOTE — Brief Op Note (Addendum)
02/05/2013  12:58 PM  PATIENT:  Lonnie [name]  67 y.o. male  PRE-OPERATIVE DIAGNOSIS:  Severe 2 vessel CAD, severe mitral regurgitation  POST-OPERATIVE DIAGNOSIS:  Severe 2 vessel CAD, severe mitral regurgitation  PROCEDURE:    CORONARY ARTERY BYPASS GRAFTING x 3 (LIMA-LAD, SVG-D, SVG-PD)  ENDOSCOPIC VEIN HARVEST RIGHT THIGH  MITRAL VALVE REPAIR (32 mm Memo 3D annuloplasty ring, repair of papillary muscle)  CLOSURE OF PATENT FORAMEN OVALE  MITRAL VALVE REPLACEMENT(EDWARDS MAGNA PERICARDIAL VALVE, Model # [id number], serial # [id number])   SURGEON: [name], MD  ASSISTANT: Coral Ceo, PA-C  ANESTHESIA:   general  PATIENT CONDITION:  ICU - intubated and hemodynamically stable.  PRE-OPERATIVE WEIGHT: 97 kg  TEE- severe MR with flail segment, likely posterior leaflet Good targets, good conduits Ruptured posteromedial papillary muscle Repair attempted but severe MR after weaning- sutures had pulled through papillary muscle Valve had to be replaced  Post bypass TEE- good function of prosthetic MV, no paravalvular leaks

## 2013-02-06 ENCOUNTER — Inpatient Hospital Stay (HOSPITAL_COMMUNITY): Payer: Managed Care, Other (non HMO)

## 2013-02-06 ENCOUNTER — Encounter (HOSPITAL_COMMUNITY): Payer: Self-pay | Admitting: Thoracic Surgery (Cardiothoracic Vascular Surgery)

## 2013-02-06 DIAGNOSIS — I059 Rheumatic mitral valve disease, unspecified: Secondary | ICD-10-CM

## 2013-02-06 LAB — CBC
HCT: 21.5 % — ABNORMAL LOW (ref 39.0–52.0)
HCT: 19.2 % — ABNORMAL LOW (ref 39.0–52.0)
HCT: 19.6 % — ABNORMAL LOW (ref 39.0–52.0)
Hemoglobin: 7.5 g/dL — ABNORMAL LOW (ref 13.0–17.0)
Hemoglobin: 6.7 g/dL — CL (ref 13.0–17.0)
MCH: 31.6 pg (ref 26.0–34.0)
MCH: 31 pg (ref 26.0–34.0)
MCH: 31.9 pg (ref 26.0–34.0)
MCHC: 34.2 g/dL (ref 30.0–36.0)
MCHC: 36.4 g/dL — ABNORMAL HIGH (ref 30.0–36.0)
MCV: 90.7 fL (ref 78.0–100.0)
MCV: 88.9 fL (ref 78.0–100.0)
MCV: 87.5 fL (ref 78.0–100.0)
Platelets: 103 10*3/uL — ABNORMAL LOW (ref 150–400)
Platelets: 92 10*3/uL — ABNORMAL LOW (ref 150–400)
RBC: 2.37 MIL/uL — ABNORMAL LOW (ref 4.22–5.81)
RBC: 2.16 MIL/uL — ABNORMAL LOW (ref 4.22–5.81)
RDW: 14.7 % (ref 11.5–15.5)
WBC: 25.4 10*3/uL — ABNORMAL HIGH (ref 4.0–10.5)
WBC: 17.2 10*3/uL — ABNORMAL HIGH (ref 4.0–10.5)
WBC: 17.5 10*3/uL — ABNORMAL HIGH (ref 4.0–10.5)

## 2013-02-06 LAB — POCT I-STAT 4, (NA,K, GLUC, HGB,HCT)
Glucose, Bld: 126 mg/dL — ABNORMAL HIGH (ref 70–99)
HCT: 21 % — ABNORMAL LOW (ref 39.0–52.0)
Hemoglobin: 7.1 g/dL — ABNORMAL LOW (ref 13.0–17.0)

## 2013-02-06 LAB — GLUCOSE, CAPILLARY
Glucose-Capillary: 102 mg/dL — ABNORMAL HIGH (ref 70–99)
Glucose-Capillary: 103 mg/dL — ABNORMAL HIGH (ref 70–99)
Glucose-Capillary: 114 mg/dL — ABNORMAL HIGH (ref 70–99)
Glucose-Capillary: 123 mg/dL — ABNORMAL HIGH (ref 70–99)
Glucose-Capillary: 87 mg/dL (ref 70–99)
Glucose-Capillary: 91 mg/dL (ref 70–99)

## 2013-02-06 LAB — POCT I-STAT, CHEM 8
BUN: 34 mg/dL — ABNORMAL HIGH (ref 6–23)
HCT: 27 % — ABNORMAL LOW (ref 39.0–52.0)
Hemoglobin: 9.2 g/dL — ABNORMAL LOW (ref 13.0–17.0)
Sodium: 137 mEq/L (ref 135–145)
TCO2: 23 mmol/L (ref 0–100)

## 2013-02-06 LAB — BASIC METABOLIC PANEL
BUN: 34 mg/dL — ABNORMAL HIGH (ref 6–23)
BUN: 37 mg/dL — ABNORMAL HIGH (ref 6–23)
CO2: 24 mEq/L (ref 19–32)
Chloride: 101 mEq/L (ref 96–112)
Chloride: 102 mEq/L (ref 96–112)
Creatinine, Ser: 2.03 mg/dL — ABNORMAL HIGH (ref 0.50–1.35)
GFR calc Af Amer: 47 mL/min — ABNORMAL LOW (ref >90)
GFR calc non Af Amer: 40 mL/min — ABNORMAL LOW (ref >90)
Glucose, Bld: 124 mg/dL — ABNORMAL HIGH (ref 70–99)
Glucose, Bld: 99 mg/dL (ref 70–99)
Potassium: 4.7 mEq/L (ref 3.5–5.1)
Sodium: 136 mEq/L (ref 135–145)

## 2013-02-06 LAB — POCT I-STAT 3, ART BLOOD GAS (G3+)
Acid-base deficit: 2 mmol/L (ref 0.0–2.0)
Patient temperature: 36.7
pCO2 arterial: 43 mmHg (ref 35.0–45.0)
pH, Arterial: 7.372 (ref 7.350–7.450)
pO2, Arterial: 200 mmHg — ABNORMAL HIGH (ref 80.0–100.0)
pO2, Arterial: 53 mmHg — ABNORMAL LOW (ref 80.0–100.0)

## 2013-02-06 LAB — APTT: aPTT: 48 seconds — ABNORMAL HIGH (ref 24–37)

## 2013-02-06 LAB — PROTIME-INR: INR: 1.7 — ABNORMAL HIGH (ref 0.00–1.49)

## 2013-02-06 MED ORDER — BIOTENE DRY MOUTH MT LIQD: 15.0000 mL | OROMUCOSAL | Status: DC | PRN

## 2013-02-06 MED ORDER — INSULIN DETEMIR 100 UNIT/ML ~~LOC~~ SOLN
20.0000 [IU] | Freq: Every day | SUBCUTANEOUS | Status: DC
Start: 2013-02-06 — End: 2013-02-08
  Administered 2013-02-06 – 2013-02-07 (×2): 20 [IU] via SUBCUTANEOUS
  Filled 2013-02-06 (×3): qty 0.2

## 2013-02-06 MED ORDER — INSULIN ASPART 100 UNIT/ML ~~LOC~~ SOLN
0.0000 [IU] | SUBCUTANEOUS | Status: DC
  Administered 2013-02-07 (×4): 2 [IU] via SUBCUTANEOUS
  Administered 2013-02-07: 4 [IU] via SUBCUTANEOUS
  Administered 2013-02-08 – 2013-02-11 (×2): 2 [IU] via SUBCUTANEOUS

## 2013-02-06 MED ORDER — DOPAMINE-DEXTROSE 3.2-5 MG/ML-% IV SOLN
0.0000 ug/kg/min | INTRAVENOUS | Status: DC
  Administered 2013-02-07 – 2013-02-08 (×2): 5 ug/kg/min via INTRAVENOUS
  Administered 2013-02-09 – 2013-02-11 (×2): 3 ug/kg/min via INTRAVENOUS
  Filled 2013-02-06 (×4): qty 250

## 2013-02-06 MED ORDER — FUROSEMIDE 10 MG/ML IJ SOLN
20.0000 mg | Freq: Once | INTRAMUSCULAR | Status: AC
  Administered 2013-02-06: 20 mg via INTRAVENOUS

## 2013-02-06 MED ORDER — INSULIN ASPART 100 UNIT/ML ~~LOC~~ SOLN: 0.0000 [IU] | SUBCUTANEOUS | Status: DC

## 2013-02-06 MED ORDER — NOREPINEPHRINE BITARTRATE 1 MG/ML IJ SOLN: 0.0000 ug/min | INTRAVENOUS | Status: DC

## 2013-02-06 MED ORDER — CHLORHEXIDINE GLUCONATE 0.12 % MT SOLN
15.0000 mL | Freq: Four times a day (QID) | OROMUCOSAL | Status: DC
  Administered 2013-02-06: 15 mL via OROMUCOSAL

## 2013-02-06 MED ORDER — AMIODARONE HCL IN DEXTROSE 360-4.14 MG/200ML-% IV SOLN
30.0000 mg/h | INTRAVENOUS | Status: AC
  Administered 2013-02-06 – 2013-02-11 (×13): 30 mg/h via INTRAVENOUS
  Filled 2013-02-06 (×27): qty 200

## 2013-02-06 MED ORDER — BIOTENE DRY MOUTH MT LIQD
15.0000 mL | Freq: Four times a day (QID) | OROMUCOSAL | Status: DC
  Administered 2013-02-06 – 2013-02-10 (×18): 15 mL via OROMUCOSAL

## 2013-02-06 MED ORDER — CHLORHEXIDINE GLUCONATE 0.12 % MT SOLN
15.0000 mL | Freq: Two times a day (BID) | OROMUCOSAL | Status: DC
  Administered 2013-02-06 – 2013-02-09 (×7): 15 mL via OROMUCOSAL
  Filled 2013-02-06 (×6): qty 15

## 2013-02-06 MED ORDER — INSULIN ASPART 100 UNIT/ML ~~LOC~~ SOLN
0.0000 [IU] | SUBCUTANEOUS | Status: AC
  Administered 2013-02-06 (×2): 2 [IU] via SUBCUTANEOUS

## 2013-02-06 MED ORDER — ALBUMIN HUMAN 5 % IV SOLN
250.0000 mL | INTRAVENOUS | Status: AC | PRN
  Administered 2013-02-06: 250 mL via INTRAVENOUS

## 2013-02-06 MED ORDER — ALBUMIN HUMAN 5 % IV SOLN
INTRAVENOUS | Status: AC
  Administered 2013-02-06: 12.5 g
  Filled 2013-02-06: qty 500

## 2013-02-06 MED ORDER — PROTAMINE SULFATE 10 MG/ML IV SOLN
25.0000 mg | Freq: Once | INTRAVENOUS | Status: AC
  Administered 2013-02-06: 25 mg via INTRAVENOUS
  Filled 2013-02-06: qty 5

## 2013-02-06 MED ORDER — FUROSEMIDE 10 MG/ML IJ SOLN
10.0000 mg/h | INTRAVENOUS | Status: DC
  Administered 2013-02-06 – 2013-02-07 (×2): 10 mg/h via INTRAVENOUS
  Administered 2013-02-08: 20 mg/h via INTRAVENOUS
  Administered 2013-02-09: 10 mg/h via INTRAVENOUS
  Filled 2013-02-06 (×8): qty 25

## 2013-02-06 MED ORDER — NOREPINEPHRINE BITARTRATE 1 MG/ML IJ SOLN
0.0000 ug/min | INTRAVENOUS | Status: DC
  Administered 2013-02-07: 6 ug/min via INTRAVENOUS
  Administered 2013-02-07: 7 ug/min via INTRAVENOUS
  Administered 2013-02-08: 4 ug/min via INTRAVENOUS
  Filled 2013-02-06 (×5): qty 4

## 2013-02-06 MED FILL — Potassium Chloride Inj 2 mEq/ML: INTRAVENOUS | Qty: 40 | Status: AC

## 2013-02-06 MED FILL — Heparin Sodium (Porcine) Inj 1000 Unit/ML: INTRAMUSCULAR | Qty: 30 | Status: AC

## 2013-02-06 MED FILL — Magnesium Sulfate Inj 50%: INTRAMUSCULAR | Qty: 10 | Status: AC

## 2013-02-06 MED FILL — Sodium Chloride IV Soln 0.9%: INTRAVENOUS | Qty: 1000 | Status: AC

## 2013-02-06 NOTE — Progress Notes (Signed)
Patient ID: Lonnie [name], male   DOB: Dec 24, 1945, 67 y.o.   MRN: [id number]      [address]       [name] [address]             [phone]                 1 Day Post-Op Procedure(s) (LRB): INTRAOPERATIVE TRANSESOPHAGEAL ECHOCARDIOGRAM (N/A) ENDOVEIN HARVEST OF GREATER SAPHENOUS VEIN (Right) PATENT FORAMEN OVALE CLOSURE (N/A) MITRAL VALVE REPLACEMENT (MVR)/CORONARY ARTERY BYPASS GRAFTING (CABG) (N/A)  Total Length of Stay:  LOS: 6 days  BP 95/54  Pulse 71  Temp(Src) 99 F (37.2 C) (Core (Comment))  Resp 19  Ht 6\' 1"  (1.854 m)  Wt 214 lb 4.6 oz (97.2 kg)  BMI 28.28 kg/m2  SpO2 96%  .Intake/Output     05/30 0701 - 05/31 0700   I.V. (mL/kg) 1390.9 (14.3)   Blood 700   IV Piggyback 100   Total Intake(mL/kg) 2190.9 (22.5)   Urine (mL/kg/hr) 510 (0.4)   Emesis/NG output 250 (0.2)   Chest Tube 250 (0.2)   Total Output 1010   Net +1180.9         . sodium chloride 20 mL/hr at 02/05/13 2015  . sodium chloride 20 mL/hr at 02/05/13 2015  . sodium chloride    . amiodarone (NEXTERONE PREMIX) 360 mg/200 mL dextrose 30 mg/hr (02/06/13 0839)  . dexmedetomidine 0.7 mcg/kg/hr (02/06/13 1649)  . furosemide (LASIX) infusion 10 mg/hr (02/06/13 0957)  . lactated ringers 20 mL/hr at 02/05/13 2015  . milrinone 0.3 mcg/kg/min (02/06/13 1205)  . nitroGLYCERIN Stopped (02/05/13 2000)  . phenylephrine (NEO-SYNEPHRINE) Adult infusion Stopped (02/06/13 0530)     Lab Results  Component Value Date   WBC 16.6* 02/06/2013   HGB 9.2* 02/06/2013   HCT 27.0* 02/06/2013   PLT 92* 02/06/2013   GLUCOSE 94 02/06/2013   CHOL 149 02/01/2013   TRIG 114 02/01/2013   HDL 31* 02/01/2013   LDLCALC 95 02/01/2013   ALT 103* 01/31/2013   AST 41* 01/31/2013   NA 137 02/06/2013   K 4.5 02/06/2013   CL 104 02/06/2013   CREATININE 2.00* 02/06/2013   BUN 34* 02/06/2013   CO2 24 02/06/2013   TSH 2.711 02/01/2013   INR 1.70* 02/06/2013   HGBA1C 5.3 02/04/2013   Remains sedated on vent , not ready  to wean yet Cr elevated more Delight Ovens MD  Beeper [phone] Office [phone] 02/06/2013 7:26 PM

## 2013-02-06 NOTE — Progress Notes (Signed)
INITIAL NUTRITION ASSESSMENT  DOCUMENTATION CODES Per approved criteria  -Not Applicable   INTERVENTION:  If EN started, recommend Osmolite 1.5 formula ---> initiate at 15 ml/hr and increase by 10 ml every 4 hours to goal rate of 45 ml/hr with Prostat liquid protein 30 ml 4 times daily to provide 2020 kcals, 128 gm protein, 823 ml of water RD to follow for nutrition care plan  NUTRITION DIAGNOSIS: Inadequate oral intake related to inability to eat as evidenced by NPO status   Goal: Initiate EN support within next 24-48 hours if prolonged intubation expected  Monitor:  EN initiation, respiratory status, weight, labs, I/O's  Reason for Assessment: VDRF  67 y.o. male  Admitting Dx: Acute MI, inferior wall, initial episode of care  ASSESSMENT:  Patient with no prior cardiac history presented to [facility] with a 4-day history of dyspnea on exertion and orthopnea; underwent cardiac cath which revealed severe 2 vessel CAD.   Patient s/p procedures 5/29: ENDOVEIN HARVEST OF GREATER SAPHENOUS VEIN  PATENT FORAMEN OVALE CLOSURE  MITRAL VALVE REPLACEMENT CORONARY ARTERY BYPASS GRAFTING   Patient is currently intubated on ventilator support MV: 11.7 Temp: 36.4  Height: Ht Readings from Last 1 Encounters:  01/31/13 6\' 1"  (1.854 m)    Weight: Wt Readings from Last 1 Encounters:  02/05/13 214 lb 4.6 oz (97.2 kg)    Ideal Body Weight: 184 lb  % Ideal Body Weight: 116%  Wt Readings from Last 10 Encounters:  02/05/13 214 lb 4.6 oz (97.2 kg)  02/05/13 214 lb 4.6 oz (97.2 kg)  02/05/13 214 lb 4.6 oz (97.2 kg)  02/05/13 214 lb 4.6 oz (97.2 kg)  01/08/13 224 lb 3.2 oz (101.696 kg)    Usual Body Weight: 224 lb  % Usual Body Weight: 95%  BMI:  Body mass index is 28.28 kg/(m^2).  Estimated Nutritional Needs: Kcal: 1900-2100 Protein: 120-130 gm Fluid: 1.9-2.1 L  Skin: sternum & leg surgical incisions   Diet Order: NPO  EDUCATION NEEDS: -No education needs  identified at this time   Intake/Output Summary (Last 24 hours) at 02/06/13 1107 Last data filed at 02/06/13 1100  Gross per 24 hour  Intake 14864.55 ml  Output   4060 ml  Net 10804.55 ml    Labs:   Recent Labs Lab 01/31/13 0950  02/04/13 1020 02/05/13 0505  02/05/13 1925 02/06/13 0110 02/06/13 0531  NA 137  < > 138 136  < > 139 137 136  K 4.0  < > 4.0 3.9  < > 4.3 5.1 4.7  CL 99  < > 93* 90*  --   --   --  101  CO2 22  < > 36* 32  --   --   --  26  BUN 29*  < > 37* 37*  --   --   --  34*  CREATININE 1.33  < > 1.43* 1.53*  --   --   --  1.68*  CALCIUM 8.8  < > 9.5 9.6  --   --   --  8.9  MG 2.1  --   --  2.1  --   --   --  3.6*  GLUCOSE 118*  < > 111* 127*  < > 80 126* 124*  < > = values in this interval not displayed.  CBG (last 3)   Recent Labs  02/06/13 0824 02/06/13 0918 02/06/13 1014  GLUCAP 87 102* 117*    Scheduled Meds: . acetaminophen  1,000 mg  Oral Q6H   Or  . acetaminophen (TYLENOL) oral liquid 160 mg/5 mL  975 mg Per Tube Q6H  . antiseptic oral rinse  15 mL Mouth Rinse QID  . aspirin EC  325 mg Oral Daily   Or  . aspirin  324 mg Per Tube Daily  . atorvastatin  80 mg Oral q1800  . bisacodyl  10 mg Oral Daily   Or  . bisacodyl  10 mg Rectal Daily  . cefUROXime (ZINACEF)  IV  1.5 g Intravenous Q12H  . chlorhexidine  15 mL Mouth/Throat BID  . docusate sodium  200 mg Oral Daily  . insulin regular  0-10 Units Intravenous TID WC  . metoCLOPramide (REGLAN) injection  10 mg Intravenous Q6H  . metoprolol tartrate  12.5 mg Oral BID   Or  . metoprolol tartrate  12.5 mg Per Tube BID  . [START ON 02/07/2013] pantoprazole  40 mg Oral Daily  . sodium chloride  3 mL Intravenous Q12H    Continuous Infusions: . sodium chloride 20 mL/hr at 02/05/13 2015  . sodium chloride 20 mL/hr at 02/05/13 2015  . sodium chloride    . amiodarone (NEXTERONE PREMIX) 360 mg/200 mL dextrose 30 mg/hr (02/06/13 0839)  . dexmedetomidine 0.7 mcg/kg/hr (02/06/13 0914)  .  furosemide (LASIX) infusion 10 mg/hr (02/06/13 0957)  . insulin (NOVOLIN-R) infusion 2.5 Units/hr (02/06/13 0720)  . lactated ringers 20 mL/hr at 02/05/13 2015  . milrinone 0.3 mcg/kg/min (02/06/13 0700)  . nitroGLYCERIN Stopped (02/05/13 2000)  . phenylephrine (NEO-SYNEPHRINE) Adult infusion Stopped (02/06/13 0530)    Past Medical History  Diagnosis Date  . High cholesterol   . Hypertension   . Constipation   . Reflux   . Complication of anesthesia     "Hard time waking me up" after sedation after dental procedure    Past Surgical History  Procedure Laterality Date  . Stomach ulcer repair    . Tee without cardioversion N/A 02/04/2013    Procedure: TRANSESOPHAGEAL ECHOCARDIOGRAM (TEE);  Surgeon: Vesta Mixer, MD;  Location: [facility] ENDOSCOPY;  Service: Cardiovascular;  Laterality: N/A;    Maureen Chatters, RD, LDN Pager #: [phone] After-Hours Pager #: [phone]

## 2013-02-06 NOTE — Progress Notes (Signed)
Nursing  Patient coughing and gagging, Versed given. BP dropped to 70's/40's and lost atrial capture (pt was AAI @90 ). Unable to dual pace patient and pt no SR 70's with adequate BP 110's/60's.  Now pacer set at VVI backup @ 60.  Will continue to monitor.  L Simara Rhyner RN

## 2013-02-06 NOTE — Progress Notes (Signed)
RT note: Attempted SICU vent wean. Patient changed to 40% and rate of 4. Over 4 minutes patients sats dropped from 95% to 91%. Placed back on previous vent settings.

## 2013-02-06 NOTE — Progress Notes (Signed)
PROGRESS NOTE  Subjective:   Lonnie [name] is doing better from a rhythm standpoint.  Had significant bleeding from his chest tubes.  Required transfusions.   Objective:    Vital Signs:   Temp:  [96.4 F (35.8 C)-97.9 F (36.6 C)] 97.2 F (36.2 C) (05/30 0730) Pulse Rate:  [73-90] 74 (05/30 0730) Resp:  [3-21] 14 (05/30 0730) BP: (81-126)/(51-72) 98/62 mmHg (05/30 0700) SpO2:  [89 %-100 %] 100 % (05/30 0730) FiO2 (%):  [90 %-100 %] 90 % (05/30 0400)  Last BM Date: 01/28/13   24-hour weight change: Weight change:   Weight trends: Filed Weights   02/03/13 0500 02/04/13 0600 02/05/13 0440  Weight: 214 lb 15.2 oz (97.5 kg) 215 lb 6.2 oz (97.7 kg) 214 lb 4.6 oz (97.2 kg)    Intake/Output:  05/29 0701 - 05/30 0700 In: 13607.7 [I.V.:7918.7; Blood:3189; IV Piggyback:2500] Out: 1610 [Urine:2355; Chest Tube:1650]     Physical Exam: BP 98/62  Pulse 74  Temp(Src) 97.2 F (36.2 C) (Core (Comment))  Resp 14  Ht 6\' 1"  (1.854 m)  Wt 214 lb 4.6 oz (97.2 kg)  BMI 28.28 kg/m2  SpO2 100%  General: Vital signs reviewed and noted.   Head: Normocephalic, atraumatic. intubated  Eyes: asleep  Throat: normal  Neck:  normal  Lungs:    on the vent  Heart:  RR,   Abdomen:  Soft, non-tender, non-distended    Extremities: Diffuse 1+ edema  Neurologic: Patient is asleep  Psych: Normal when awake according to nurses    Labs: BMET:  Recent Labs  02/05/13 0505  02/06/13 0110 02/06/13 0531  NA 136  < > 137 136  K 3.9  < > 5.1 4.7  CL 90*  --   --  101  CO2 32  --   --  26  GLUCOSE 127*  < > 126* 124*  BUN 37*  --   --  34*  CREATININE 1.53*  --   --  1.68*  CALCIUM 9.6  --   --  8.9  MG 2.1  --   --  3.6*  < > = values in this interval not displayed.  Liver function tests: No results found for this basename: AST, ALT, ALKPHOS, BILITOT, PROT, ALBUMIN,  in the last 72 hours No results found for this basename: LIPASE, AMYLASE,  in the last 72 hours  CBC:  Recent Labs  02/06/13 0531 02/06/13 0557  WBC 17.2* 17.5*  HGB 6.7* 6.7*  HCT 19.6* 19.2*  MCV 89.5 88.9  PLT 95* 97*    Cardiac Enzymes: No results found for this basename: CKTOTAL, CKMB, TROPONINI,  in the last 72 hours  Coagulation Studies:  Recent Labs  02/05/13 1920 02/06/13 0127  LABPROT 22.0* 19.4*  INR 2.01* 1.70*    Other: No components found with this basename: POCBNP,  No results found for this basename: DDIMER,  in the last 72 hours  Recent Labs  02/04/13 1856  HGBA1C 5.3   No results found for this basename: CHOL, HDL, LDLCALC, TRIG, CHOLHDL,  in the last 72 hours No results found for this basename: TSH, T4TOTAL, FREET3, T3FREE, THYROIDAB,  in the last 72 hours No results found for this basename: VITAMINB12, FOLATE, FERRITIN, TIBC, IRON, RETICCTPCT,  in the last 72 hours   Other results:  Tele:  NSR with 1st degree AV block.   Medications:    Infusions: . sodium chloride 20 mL/hr at 02/05/13 2015  . sodium chloride 20 mL/hr at 02/05/13  2015  . sodium chloride    . amiodarone (NEXTERONE PREMIX) 360 mg/200 mL dextrose 30 mg/hr (02/06/13 0700)  . dexmedetomidine 0.7 mcg/kg/hr (02/06/13 0700)  . insulin (NOVOLIN-R) infusion 2.5 Units/hr (02/06/13 0720)  . lactated ringers 20 mL/hr at 02/05/13 2015  . milrinone 0.3 mcg/kg/min (02/06/13 0700)  . nitroGLYCERIN Stopped (02/05/13 2000)  . phenylephrine (NEO-SYNEPHRINE) Adult infusion Stopped (02/06/13 0530)    Scheduled Medications: . acetaminophen  1,000 mg Oral Q6H   Or  . acetaminophen (TYLENOL) oral liquid 160 mg/5 mL  975 mg Per Tube Q6H  . aspirin EC  325 mg Oral Daily   Or  . aspirin  324 mg Per Tube Daily  . atorvastatin  80 mg Oral q1800  . bisacodyl  10 mg Oral Daily   Or  . bisacodyl  10 mg Rectal Daily  . cefUROXime (ZINACEF)  IV  1.5 g Intravenous Q12H  . docusate sodium  200 mg Oral Daily  . famotidine (PEPCID) IV  20 mg Intravenous Q12H  . insulin regular  0-10 Units Intravenous TID WC  .  metoCLOPramide (REGLAN) injection  10 mg Intravenous Q6H  . metoprolol tartrate  12.5 mg Oral BID   Or  . metoprolol tartrate  12.5 mg Per Tube BID  . norepinephrine (LEVOPHED) Adult infusion  2-50 mcg/min Intravenous To OR  . [START ON 02/07/2013] pantoprazole  40 mg Oral Daily  . sodium chloride  3 mL Intravenous Q12H    Assessment/ Plan:    1. CAD:  Presented with Inf. Mi.  .  Was noted to have severe MR due to ruptured chordae Now, s/p CABG and MVR.  Still on pressers.  Ventricular fibrillation:  Rhythm is more stable on amiodarone     Essential hypertension, benign   High cholesterol   Acute CHF   AKI (acute kidney injury)   Leukocytosis   Acute pulmonary edema   Pulmonary infiltrates on CXR   Disposition:  Plans per TCTS. Length of Stay: 6  Vesta Mixer, [name]., MD, [facility] 02/06/2013, 7:35 AM Office [phone] Pager [phone]

## 2013-02-06 NOTE — Progress Notes (Signed)
Nursing  AM Hgb 6.7 critical called. Repeated CBC to verify and Hgb 6.7.  No signs of bleeding from CTs and VS stable on pressors. Will monitor.  L Shaquela Weichert RN

## 2013-02-06 NOTE — Progress Notes (Signed)
Nursing   Noted pt to be bleeding, CT output at 0100 220cc for 1 hour.  Steading bleeding from all tubes STAT labs sent.  At 0130, additional 230cc out of chest tubes for 30 minutes and Emergency Bleeding Protocol initiated. Dr Dorris Fetch paged and orders received.  Protamine 20mg  IV given and blood bank notified.  Dr. Dorris Fetch at bedside at 0145. CT output at 0200 is 620cc for the hour.  Blood products infusing at 0220am.  Further orders from Dr. Dorris Fetch at bedside received and will continue to monitor and reassess.  Will transfuse a total of 2 PRBC, 4FFP, 1 bag of platelets.  L Yashika Mask RN

## 2013-02-06 NOTE — Progress Notes (Signed)
1 Day Post-Op Procedure(s) (LRB): INTRAOPERATIVE TRANSESOPHAGEAL ECHOCARDIOGRAM (N/A) ENDOVEIN HARVEST OF GREATER SAPHENOUS VEIN (Right) PATENT FORAMEN OVALE CLOSURE (N/A) MITRAL VALVE REPLACEMENT (MVR)/CORONARY ARTERY BYPASS GRAFTING (CABG) (N/A) Subjective: Intubated, but awake, follows commands  Objective: Vital signs in last 24 hours: Temp:  [96.4 F (35.8 C)-97.9 F (36.6 C)] 97.2 F (36.2 C) (05/30 0745) Pulse Rate:  [73-90] 74 (05/30 0745) Cardiac Rhythm:  [-] Normal sinus rhythm (05/30 0740) Resp:  [3-21] 14 (05/30 0745) BP: (81-126)/(51-72) 98/62 mmHg (05/30 0700) SpO2:  [89 %-100 %] 100 % (05/30 0745) FiO2 (%):  [90 %-100 %] 90 % (05/30 0400)  Hemodynamic parameters for last 24 hours: PAP: (24-42)/(16-27) 37/23 mmHg CO:  [4.2 L/min-5 L/min] 5 L/min CI:  [1.9 L/min/m2-2.3 L/min/m2] 2.3 L/min/m2  Intake/Output from previous day: 05/29 0701 - 05/30 0700 In: 13607.7 [I.V.:7918.7; Blood:3189; IV Piggyback:2500] Out: 1610 [Urine:2355; Chest Tube:1650] Intake/Output this shift:    General appearance: cooperative Neurologic: no focal motor deficit Heart: regular rate and rhythm Lungs: clear to auscultation bilaterally Abdomen: normal findings: soft, non-tender  Lab Results:  Recent Labs  02/06/13 0531 02/06/13 0557  WBC 17.2* 17.5*  HGB 6.7* 6.7*  HCT 19.6* 19.2*  PLT 95* 97*   BMET:  Recent Labs  02/05/13 0505  02/06/13 0110 02/06/13 0531  NA 136  < > 137 136  K 3.9  < > 5.1 4.7  CL 90*  --   --  101  CO2 32  --   --  26  GLUCOSE 127*  < > 126* 124*  BUN 37*  --   --  34*  CREATININE 1.53*  --   --  1.68*  CALCIUM 9.6  --   --  8.9  < > = values in this interval not displayed.  PT/INR:  Recent Labs  02/06/13 0127  LABPROT 19.4*  INR 1.70*   ABG    Component Value Date/Time   PHART 7.372 02/06/2013 0518   HCO3 25.2* 02/06/2013 0518   TCO2 27 02/06/2013 0518   ACIDBASEDEF 2.0 02/05/2013 2158   O2SAT 100.0 02/06/2013 0518   CBG (last 3)    Recent Labs  02/05/13 2236 02/05/13 2336 02/06/13 0033  GLUCAP 122* 123* 121*    Assessment/Plan: S/P Procedure(s) (LRB): INTRAOPERATIVE TRANSESOPHAGEAL ECHOCARDIOGRAM (N/A) ENDOVEIN HARVEST OF GREATER SAPHENOUS VEIN (Right) PATENT FORAMEN OVALE CLOSURE (N/A) MITRAL VALVE REPLACEMENT (MVR)/CORONARY ARTERY BYPASS GRAFTING (CABG) (N/A) POD # 1 CV- stable hemodynamics, no significant arrythmias  Continue IV amiodarone  Wean dopamine and levophed as BP allows  RESP- pulmonary edema- diurese  Wean FiO2/PEEP as BP allows- doubt he will be able to be extubated today  RENAL- creatinine up after long pump run, lasix gtt, monitor  Anemia secondary to ABL- transfuse  Thrombocytopenia- received platelets- follow  Coagulopathy- received FFP- follow  ENDO- CBG wel controlled with insulin gtt   LOS: 6 days    Lonnie [name] C 02/06/2013

## 2013-02-07 ENCOUNTER — Inpatient Hospital Stay (HOSPITAL_COMMUNITY): Payer: Managed Care, Other (non HMO)

## 2013-02-07 LAB — CBC
Hemoglobin: 8 g/dL — ABNORMAL LOW (ref 13.0–17.0)
MCH: 30.9 pg (ref 26.0–34.0)
MCHC: 34.8 g/dL (ref 30.0–36.0)
MCV: 88.8 fL (ref 78.0–100.0)
Platelets: 105 10*3/uL — ABNORMAL LOW (ref 150–400)

## 2013-02-07 LAB — CULTURE, BLOOD (ROUTINE X 2)

## 2013-02-07 LAB — COMPREHENSIVE METABOLIC PANEL
ALT: 43 U/L (ref 0–53)
AST: 80 U/L — ABNORMAL HIGH (ref 0–37)
Albumin: 2.9 g/dL — ABNORMAL LOW (ref 3.5–5.2)
Alkaline Phosphatase: 57 U/L (ref 39–117)
BUN: 46 mg/dL — ABNORMAL HIGH (ref 6–23)
CO2: 24 mEq/L (ref 19–32)
Calcium: 8.2 mg/dL — ABNORMAL LOW (ref 8.4–10.5)
Chloride: 100 mEq/L (ref 96–112)
Creatinine, Ser: 2.66 mg/dL — ABNORMAL HIGH (ref 0.50–1.35)
GFR calc Af Amer: 27 mL/min — ABNORMAL LOW (ref >90)
GFR calc non Af Amer: 23 mL/min — ABNORMAL LOW (ref >90)
Glucose, Bld: 162 mg/dL — ABNORMAL HIGH (ref 70–99)
Potassium: 5 mEq/L (ref 3.5–5.1)
Sodium: 135 mEq/L (ref 135–145)
Total Bilirubin: 0.7 mg/dL (ref 0.3–1.2)
Total Protein: 5.3 g/dL — ABNORMAL LOW (ref 6.0–8.3)

## 2013-02-07 LAB — GLUCOSE, CAPILLARY
Glucose-Capillary: 147 mg/dL — ABNORMAL HIGH (ref 70–99)
Glucose-Capillary: 143 mg/dL — ABNORMAL HIGH (ref 70–99)
Glucose-Capillary: 142 mg/dL — ABNORMAL HIGH (ref 70–99)
Glucose-Capillary: 123 mg/dL — ABNORMAL HIGH (ref 70–99)

## 2013-02-07 LAB — MRSA CULTURE

## 2013-02-07 MED ORDER — PANTOPRAZOLE SODIUM 40 MG PO PACK
40.0000 mg | PACK | Freq: Every day | ORAL | Status: DC
  Administered 2013-02-07 – 2013-02-09 (×3): 40 mg
  Filled 2013-02-07 (×5): qty 20

## 2013-02-07 MED ORDER — DOCUSATE SODIUM 50 MG/5ML PO LIQD
200.0000 mg | Freq: Every day | ORAL | Status: DC
  Administered 2013-02-07 – 2013-02-09 (×3): 200 mg
  Filled 2013-02-07 (×4): qty 20

## 2013-02-07 NOTE — Progress Notes (Signed)
Patient ID: Lonnie [name], male   DOB: [birth year], 67 y.o.   MRN: [id number]      [address]       [name] [address]             [phone]                 2 Days Post-Op Procedure(s) (LRB): INTRAOPERATIVE TRANSESOPHAGEAL ECHOCARDIOGRAM (N/A) ENDOVEIN HARVEST OF GREATER SAPHENOUS VEIN (Right) PATENT FORAMEN OVALE CLOSURE (N/A) MITRAL VALVE REPLACEMENT (MVR)/CORONARY ARTERY BYPASS GRAFTING (CABG) (N/A)  Total Length of Stay:  LOS: 7 days  BP 96/55  Pulse 75  Temp(Src) 99.7 F (37.6 C) (Core (Comment))  Resp 11  Ht 6\' 1"  (1.854 m)  Wt 245 lb 13 oz (111.5 kg)  BMI 32.44 kg/m2  SpO2 95%  .Intake/Output     05/31 0701 - 06/01 0700   I.V. (mL/kg) 1483.3 (13.3)   Blood    NG/GT 30   IV Piggyback 50   Total Intake(mL/kg) 1563.3 (14)   Urine (mL/kg/hr) 605 (0.4)   Emesis/NG output 100 (0.1)   Chest Tube 30 (0)   Total Output 735   Net +828.3         . sodium chloride 20 mL/hr at 02/05/13 2015  . sodium chloride 20 mL/hr at 02/05/13 2015  . sodium chloride    . amiodarone (NEXTERONE PREMIX) 360 mg/200 mL dextrose 30 mg/hr (02/07/13 1135)  . dexmedetomidine 0.7 mcg/kg/hr (02/07/13 1632)  . DOPamine 5 mcg/kg/min (02/07/13 1135)  . furosemide (LASIX) infusion 10 mg/hr (02/07/13 1135)  . lactated ringers Stopped (02/07/13 0000)  . milrinone 0.3 mcg/kg/min (02/07/13 1135)  . nitroGLYCERIN Stopped (02/05/13 2000)  . norepinephrine (LEVOPHED) Adult infusion 6 mcg/min (02/07/13 1345)  . phenylephrine (NEO-SYNEPHRINE) Adult infusion Stopped (02/06/13 0530)     Lab Results  Component Value Date   WBC 17.8* 02/07/2013   HGB 8.0* 02/07/2013   HCT 23.0* 02/07/2013   PLT 105* 02/07/2013   GLUCOSE 162* 02/07/2013   CHOL 149 02/01/2013   TRIG 114 02/01/2013   HDL 31* 02/01/2013   LDLCALC 95 02/01/2013   ALT 43 02/07/2013   AST 80* 02/07/2013   NA 135 02/07/2013   K 5.0 02/07/2013   CL 100 02/07/2013   CREATININE 2.66* 02/07/2013   BUN 46* 02/07/2013   CO2 24  02/07/2013   TSH 2.711 02/01/2013   INR 1.70* 02/06/2013   HGBA1C 5.3 02/04/2013   Unable to wean levophed, slight increase in uop, follow up cr in am. Still on vent  Delight Ovens MD  Beeper [phone] Office [phone] 02/07/2013 7:24 PM

## 2013-02-07 NOTE — Progress Notes (Signed)
Pt failed beginning of wean due to desat 87%

## 2013-02-07 NOTE — Progress Notes (Signed)
Patient ID: BARTLETT ENKE, male   DOB: 1946-05-01, 67 y.o.   MRN: [id number] TCTS DAILY PROGRESS NOTE                   [address]            [name] [address]          [phone]      2 Days Post-Op Procedure(s) (LRB): INTRAOPERATIVE TRANSESOPHAGEAL ECHOCARDIOGRAM (N/A) ENDOVEIN HARVEST OF GREATER SAPHENOUS VEIN (Right) PATENT FORAMEN OVALE CLOSURE (N/A) MITRAL VALVE REPLACEMENT (MVR)/CORONARY ARTERY BYPASS GRAFTING (CABG) (N/A)  Total Length of Stay:  LOS: 7 days   Subjective: Sedated on vent  Objective: Vital signs in last 24 hours: Temp:  [97.5 F (36.4 C)-100.2 F (37.9 C)] 99.5 F (37.5 C) (05/31 1000) Pulse Rate:  [58-79] 71 (05/31 1000) Cardiac Rhythm:  [-] Heart block (05/31 0800) Resp:  [13-26] 18 (05/31 1000) BP: (83-108)/(48-61) 101/56 mmHg (05/31 1000) SpO2:  [91 %-99 %] 96 % (05/31 1000) Arterial Line BP: (86-121)/(40-63) 111/52 mmHg (05/31 1000) FiO2 (%):  [40 %-50 %] 50 % (05/31 0800) Weight:  [245 lb 13 oz (111.5 kg)] 245 lb 13 oz (111.5 kg) (05/31 0500)  Filed Weights   02/04/13 0600 02/05/13 0440 02/07/13 0500  Weight: 215 lb 6.2 oz (97.7 kg) 214 lb 4.6 oz (97.2 kg) 245 lb 13 oz (111.5 kg)    Weight change:    Hemodynamic parameters for last 24 hours: PAP: (34-52)/(15-31) 38/19 mmHg CO:  [4.7 L/min-5.4 L/min] 5.2 L/min CI:  [2.1 L/min/m2-2.4 L/min/m2] 2.4 L/min/m2  Intake/Output from previous day: 05/30 0701 - 05/31 0700 In: 3887.9 [I.V.:2847.9; Blood:700; NG/GT:190; IV Piggyback:150] Out: 2085 [Urine:1105; Emesis/NG output:550; Chest Tube:430]  Intake/Output this shift: Total I/O In: 446.4 [I.V.:366.4; NG/GT:30; IV Piggyback:50] Out: 155 [Urine:155]  Current Meds: Scheduled Meds: . acetaminophen  1,000 mg Oral Q6H   Or  . acetaminophen (TYLENOL) oral liquid 160 mg/5 mL  975 mg Per Tube Q6H  . antiseptic oral rinse  15 mL Mouth Rinse QID  . aspirin EC  325 mg Oral Daily   Or  . aspirin  324 mg Per Tube Daily    . atorvastatin  80 mg Oral q1800  . bisacodyl  10 mg Oral Daily   Or  . bisacodyl  10 mg Rectal Daily  . chlorhexidine  15 mL Mouth/Throat BID  . docusate  200 mg Per Tube Daily  . insulin aspart  0-15 Units Subcutaneous Q4H  . insulin aspart  0-24 Units Subcutaneous Q4H  . insulin detemir  20 Units Subcutaneous QHS  . metoprolol tartrate  12.5 mg Oral BID   Or  . metoprolol tartrate  12.5 mg Per Tube BID  . pantoprazole sodium  40 mg Per Tube Daily  . sodium chloride  3 mL Intravenous Q12H   Continuous Infusions: . sodium chloride 20 mL/hr at 02/05/13 2015  . sodium chloride 20 mL/hr at 02/05/13 2015  . sodium chloride    . amiodarone (NEXTERONE PREMIX) 360 mg/200 mL dextrose 30 mg/hr (02/07/13 0700)  . dexmedetomidine 0.7 mcg/kg/hr (02/07/13 0740)  . DOPamine 5 mcg/kg/min (02/07/13 0700)  . furosemide (LASIX) infusion 10 mg/hr (02/07/13 0700)  . lactated ringers Stopped (02/07/13 0000)  . milrinone 0.3 mcg/kg/min (02/07/13 0700)  . nitroGLYCERIN Stopped (02/05/13 2000)  . norepinephrine (LEVOPHED) Adult infusion 6 mcg/min (02/07/13 0835)  . phenylephrine (NEO-SYNEPHRINE) Adult infusion Stopped (02/06/13 0530)   PRN Meds:.metoprolol, midazolam, morphine injection, ondansetron (ZOFRAN) IV, oxyCODONE,  sodium chloride  General appearance: sedated on vent Neurologic: intact Heart: regular rate and rhythm, S1, S2 normal, no murmur, click, rub or gallop Lungs: diminished breath sounds bilaterally Abdomen: soft, non-tender; bowel sounds normal; no masses,  no organomegaly Extremities: extremities normal, atraumatic, no cyanosis or edema and Homans sign is negative, no sign of DVT Wound: sternum stable  Lab Results: CBC: Recent Labs  02/06/13 1500 02/06/13 1505 02/07/13 0400  WBC 16.6*  --  17.8*  HGB 8.2* 9.2* 8.0*  HCT 22.5* 27.0* 23.0*  PLT 92*  --  105*   BMET:  Recent Labs  02/06/13 1500 02/06/13 1505 02/07/13 0400  NA 136 137 135  K 4.6 4.5 5.0  CL 102 104  100  CO2 24  --  24  GLUCOSE 99 94 162*  BUN 37* 34* 46*  CREATININE 2.03* 2.00* 2.66*  CALCIUM 8.4  --  8.2*    PT/INR:  Recent Labs  02/06/13 0127  LABPROT 19.4*  INR 1.70*   Radiology: Dg Chest Port 1 View  02/07/2013   *RADIOLOGY REPORT*  Clinical Data: Edema  PORTABLE CHEST - 1 VIEW  Comparison:   the previous day's study  Findings: Endotracheal tube, nasogastric tube, right IJ Swan-Ganz, and left chest tubes are stable in position.  No pneumothorax. Previous CABG and valve surgery.  Moderate bilateral interstitial and airspace edema or infiltrates, not convincingly changed from previous exam, slightly more prominent on the left than right.  No definite effusion.  IMPRESSION:  1.  Little definite change since previous day's portable exam   Original Report Authenticated By: D. Andria Rhein, MD     Assessment/Plan: S/P Procedure(s) (LRB): INTRAOPERATIVE TRANSESOPHAGEAL ECHOCARDIOGRAM (N/A) ENDOVEIN HARVEST OF GREATER SAPHENOUS VEIN (Right) PATENT FORAMEN OVALE CLOSURE (N/A) MITRAL VALVE REPLACEMENT (MVR)/CORONARY ARTERY BYPASS GRAFTING (CABG) (N/A)  POD # 2 CV- stable BP, no significant arrythmias  Continue IV amiodarone  Still on dopamine and levophed and milrinone,  wean levophed as tolerated continue dopamine   RESP- pulmonary edema- diureses limited by renal function 1000 ml uop past 24 hours  Wean FiO2/PEEP  Not ready to wean vent  RENAL- creatinine continues to increase   Anemia secondary to ABL- transfuse  Thrombocytopenia- received platelets- follow  ENDO- CBG  Controlled  insulin drip off   Izaias Krupka B 02/07/2013 10:15 AM

## 2013-02-08 ENCOUNTER — Inpatient Hospital Stay (HOSPITAL_COMMUNITY): Payer: Managed Care, Other (non HMO)

## 2013-02-08 DIAGNOSIS — I472 Ventricular tachycardia: Secondary | ICD-10-CM | POA: Diagnosis not present

## 2013-02-08 DIAGNOSIS — J9600 Acute respiratory failure, unspecified whether with hypoxia or hypercapnia: Secondary | ICD-10-CM

## 2013-02-08 DIAGNOSIS — I471 Supraventricular tachycardia: Secondary | ICD-10-CM

## 2013-02-08 DIAGNOSIS — I498 Other specified cardiac arrhythmias: Secondary | ICD-10-CM

## 2013-02-08 LAB — CBC
HCT: 23.2 % — ABNORMAL LOW (ref 39.0–52.0)
HCT: 21.3 % — ABNORMAL LOW (ref 39.0–52.0)
Hemoglobin: 7.8 g/dL — ABNORMAL LOW (ref 13.0–17.0)
Hemoglobin: 7.2 g/dL — ABNORMAL LOW (ref 13.0–17.0)
MCH: 30.4 pg (ref 26.0–34.0)
MCH: 30 pg (ref 26.0–34.0)
MCHC: 33.6 g/dL (ref 30.0–36.0)
MCHC: 33.8 g/dL (ref 30.0–36.0)
MCV: 90.3 fL (ref 78.0–100.0)
MCV: 88.8 fL (ref 78.0–100.0)
Platelets: 117 10*3/uL — ABNORMAL LOW (ref 150–400)
Platelets: 104 10*3/uL — ABNORMAL LOW (ref 150–400)
RBC: 2.57 MIL/uL — ABNORMAL LOW (ref 4.22–5.81)
RBC: 2.4 MIL/uL — ABNORMAL LOW (ref 4.22–5.81)
RDW: 14.7 % (ref 11.5–15.5)
RDW: 14.2 % (ref 11.5–15.5)
WBC: 18.6 10*3/uL — ABNORMAL HIGH (ref 4.0–10.5)
WBC: 16.2 10*3/uL — ABNORMAL HIGH (ref 4.0–10.5)

## 2013-02-08 LAB — COMPREHENSIVE METABOLIC PANEL
ALT: 39 U/L (ref 0–53)
AST: 48 U/L — ABNORMAL HIGH (ref 0–37)
Albumin: 2.6 g/dL — ABNORMAL LOW (ref 3.5–5.2)
Alkaline Phosphatase: 66 U/L (ref 39–117)
BUN: 61 mg/dL — ABNORMAL HIGH (ref 6–23)
CO2: 24 mEq/L (ref 19–32)
Calcium: 8 mg/dL — ABNORMAL LOW (ref 8.4–10.5)
Chloride: 97 mEq/L (ref 96–112)
Creatinine, Ser: 3.48 mg/dL — ABNORMAL HIGH (ref 0.50–1.35)
GFR calc Af Amer: 19 mL/min — ABNORMAL LOW (ref >90)
GFR calc non Af Amer: 17 mL/min — ABNORMAL LOW (ref >90)
Glucose, Bld: 118 mg/dL — ABNORMAL HIGH (ref 70–99)
Potassium: 4.5 mEq/L (ref 3.5–5.1)
Sodium: 132 mEq/L — ABNORMAL LOW (ref 135–145)
Total Bilirubin: 0.5 mg/dL (ref 0.3–1.2)
Total Protein: 5.5 g/dL — ABNORMAL LOW (ref 6.0–8.3)

## 2013-02-08 LAB — PREPARE FRESH FROZEN PLASMA
Unit division: 0
Unit division: 0

## 2013-02-08 LAB — TYPE AND SCREEN
ABO/RH(D): O POS
Antibody Screen: NEGATIVE
Unit division: 0
Unit division: 0
Unit division: 0
Unit division: 0

## 2013-02-08 LAB — URINALYSIS, ROUTINE W REFLEX MICROSCOPIC
Bilirubin Urine: NEGATIVE
Ketones, ur: NEGATIVE mg/dL
Nitrite: NEGATIVE
pH: 5 (ref 5.0–8.0)

## 2013-02-08 LAB — PROTIME-INR
INR: 1.34 (ref 0.00–1.49)
Prothrombin Time: 16.3 seconds — ABNORMAL HIGH (ref 11.6–15.2)

## 2013-02-08 LAB — CREATININE, SERUM
Creatinine, Ser: 3.11 mg/dL — ABNORMAL HIGH (ref 0.50–1.35)
GFR calc Af Amer: 22 mL/min — ABNORMAL LOW (ref >90)
GFR calc non Af Amer: 19 mL/min — ABNORMAL LOW (ref >90)

## 2013-02-08 LAB — IRON AND TIBC
Iron: 12 ug/dL — ABNORMAL LOW (ref 42–135)
UIBC: 169 ug/dL (ref 125–400)

## 2013-02-08 LAB — URINE MICROSCOPIC-ADD ON

## 2013-02-08 LAB — POCT I-STAT 3, ART BLOOD GAS (G3+)
Acid-base deficit: 2 mmol/L (ref 0.0–2.0)
Acid-base deficit: 2 mmol/L (ref 0.0–2.0)
Bicarbonate: 23 mEq/L (ref 20.0–24.0)
O2 Saturation: 91 %
O2 Saturation: 100 %
Patient temperature: 37
TCO2: 24 mmol/L (ref 0–100)
pCO2 arterial: 43.7 mmHg (ref 35.0–45.0)
pO2, Arterial: 240 mmHg — ABNORMAL HIGH (ref 80.0–100.0)

## 2013-02-08 LAB — PREPARE PLATELET PHERESIS

## 2013-02-08 LAB — MAGNESIUM: Magnesium: 2.8 mg/dL — ABNORMAL HIGH (ref 1.5–2.5)

## 2013-02-08 LAB — PREPARE RBC (CROSSMATCH)

## 2013-02-08 LAB — GLUCOSE, CAPILLARY
Glucose-Capillary: 71 mg/dL (ref 70–99)
Glucose-Capillary: 101 mg/dL — ABNORMAL HIGH (ref 70–99)
Glucose-Capillary: 104 mg/dL — ABNORMAL HIGH (ref 70–99)

## 2013-02-08 MED ORDER — POTASSIUM CHLORIDE 10 MEQ/50ML IV SOLN
10.0000 meq | INTRAVENOUS | Status: AC
  Administered 2013-02-08 (×2): 10 meq via INTRAVENOUS

## 2013-02-08 MED ORDER — ETOMIDATE 2 MG/ML IV SOLN
INTRAVENOUS | Status: AC
  Administered 2013-02-08: 20 mg via INTRAVENOUS
  Filled 2013-02-08: qty 10

## 2013-02-08 MED ORDER — ALBUMIN HUMAN 5 % IV SOLN
INTRAVENOUS | Status: AC
  Administered 2013-02-08: 12.5 g via INTRAVENOUS
  Filled 2013-02-08: qty 500

## 2013-02-08 MED ORDER — AMIODARONE IV BOLUS ONLY 150 MG/100ML
150.0000 mg | Freq: Once | INTRAVENOUS | Status: AC
  Administered 2013-02-08: 150 mg via INTRAVENOUS

## 2013-02-08 MED ORDER — ALBUMIN HUMAN 5 % IV SOLN: 12.5000 g | Freq: Once | INTRAVENOUS | Status: AC

## 2013-02-08 MED ORDER — AMIODARONE HCL IN DEXTROSE 360-4.14 MG/200ML-% IV SOLN
INTRAVENOUS | Status: AC
  Filled 2013-02-08: qty 200

## 2013-02-08 MED ORDER — ADENOSINE 6 MG/2ML IV SOLN
INTRAVENOUS | Status: AC
  Filled 2013-02-08: qty 4

## 2013-02-08 MED ORDER — ADENOSINE 6 MG/2ML IV SOLN
INTRAVENOUS | Status: AC
  Administered 2013-02-08: 6 mg via INTRAVENOUS
  Filled 2013-02-08: qty 2

## 2013-02-08 MED ORDER — ADENOSINE 6 MG/2ML IV SOLN: 6.0000 mg | Freq: Once | INTRAVENOUS | Status: AC

## 2013-02-08 MED ORDER — FENTANYL CITRATE 0.05 MG/ML IJ SOLN
INTRAMUSCULAR | Status: AC
  Filled 2013-02-08: qty 2

## 2013-02-08 MED ORDER — MIDAZOLAM HCL 2 MG/2ML IJ SOLN
INTRAMUSCULAR | Status: AC
  Filled 2013-02-08: qty 4

## 2013-02-08 MED ORDER — DEXMEDETOMIDINE HCL IN NACL 400 MCG/100ML IV SOLN
0.1000 ug/kg/h | INTRAVENOUS | Status: DC
  Administered 2013-02-08 – 2013-02-09 (×4): 0.7 ug/kg/h via INTRAVENOUS
  Administered 2013-02-09: 0.4 ug/kg/h via INTRAVENOUS
  Administered 2013-02-10: 0.7 ug/kg/h via INTRAVENOUS
  Filled 2013-02-08 (×7): qty 100

## 2013-02-08 MED ORDER — INSULIN DETEMIR 100 UNIT/ML ~~LOC~~ SOLN
15.0000 [IU] | Freq: Every day | SUBCUTANEOUS | Status: DC
  Administered 2013-02-08: 15 [IU] via SUBCUTANEOUS
  Filled 2013-02-08 (×2): qty 0.15

## 2013-02-08 MED ORDER — ETOMIDATE 2 MG/ML IV SOLN: 20.0000 mg | Freq: Once | INTRAVENOUS | Status: AC

## 2013-02-08 MED ORDER — ADENOSINE 12 MG/4ML IV SOLN
12.0000 mg | Freq: Once | INTRAVENOUS | Status: AC
  Administered 2013-02-08: 12 mg via INTRAVENOUS

## 2013-02-08 MED ORDER — POTASSIUM CHLORIDE 10 MEQ/50ML IV SOLN
INTRAVENOUS | Status: AC
  Administered 2013-02-08: 10 meq via INTRAVENOUS
  Filled 2013-02-08: qty 150

## 2013-02-08 NOTE — Consult Note (Signed)
[facility]        RENAL CONSULT   Reason for Consult:  worsening renal function in man with recent heart surgery Referring Physician:  Dr. Ofilia Neas  HPI: Lonnie [name] is a 67 year old white man admitted to  [facility]  01/31/2013 with chest pain and ST elevation plus positive cardiac enzymes. He had creatinine 1.49 on day of admission. He underwent cardiac catheterization on 27 May (CR 1.3). On 29 May CR was 1.53 and he  had 3 vessel CABG, mitral valve repair and replacement, and closure of PFO. CR rose to 2.0 on 30 May, 2.6 on 31 May, and 3.5 today. He is currently on furosemide drip and has remained nonoliguric. Renal consult was requested by Dr. Tyrone Sage. .    Past Medical History  Diagnosis Date  . High cholesterol   . Hypertension   . Constipation   . Reflux   . Complication of anesthesia     "Hard time waking me up" after sedation after dental procedure He has an abdominal incision (old) and past history of "surgery for ulcer."     Family History  Problem Relation Age of Onset  . 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 Scheduled: . acetaminophen  1,000 mg Oral Q6H   Or  . acetaminophen (TYLENOL) oral liquid 160 mg/5 mL  975 mg Per Tube Q6H  . antiseptic oral rinse  15 mL Mouth Rinse QID  . aspirin EC  325 mg Oral Daily   Or  . aspirin  324 mg Per Tube Daily  . atorvastatin  80 mg Oral q1800  . bisacodyl  10 mg Oral Daily   Or  . bisacodyl  10 mg Rectal Daily   . chlorhexidine  15 mL Mouth/Throat BID  . docusate  200 mg Per Tube Daily  . insulin aspart  0-24 Units Subcutaneous Q4H  . insulin detemir  15 Units Subcutaneous QHS  . metoprolol tartrate  12.5 mg Oral BID   Or  . metoprolol tartrate  12.5 mg Per Tube BID  . pantoprazole sodium  40 mg Per Tube Daily  . sodium chloride  3 mL Intravenous Q12H   Continuous: . sodium chloride 20 mL/hr at 02/05/13 2015  . sodium chloride 20 mL/hr at 02/05/13 2015  . sodium chloride    . amiodarone (NEXTERONE PREMIX) 360 mg/200 mL dextrose 30 mg/hr (02/08/13 1000)  . dexmedetomidine 0.7 mcg/kg/hr (02/08/13 0800)  . DOPamine 5 mcg/kg/min (02/08/13 0600)  . furosemide (LASIX) infusion 10 mg/hr (02/07/13 2100)  . lactated ringers Stopped (02/07/13 0000)  . milrinone 0.3 mcg/kg/min (02/08/13 1000)  . nitroGLYCERIN Stopped (02/05/13 2000)  . norepinephrine (LEVOPHED) Adult infusion 6 mcg/min (02/08/13 0600)  . phenylephrine (NEO-SYNEPHRINE) Adult infusion Stopped (02/06/13 0530)    ROS- no angina, no claudication, no melena, no hematochezia, no gross hematuria, no renal colic, no decreased force of urinary stream, no nocturia, no doe, no orthopnea, no purulent sputum, no hemoptysis, no weight gain or weight  loss, no cold or heat intolerance. + low back pain  Physical Exam Blood pressure 102/66, pulse 97, temperature 98.2 F (36.8 C), temperature source Core (Comment), resp. rate 17, height 6\' 1"  (1.854 m), weight 113.5 kg (250 lb 3.6 oz), SpO2 97.00%. General-intubated, opens eyes to questions, answers appropriately Chest-sternotomy clean, SG cath R IJ, rhonchi Heart-no rub Abd-old midline incision, no organs or masses felt, nontender Extr-1+ edema Neuro- right-handed  Date  weight 24 May  100.2 KG 28 May     97.7 31 May  111.5 1 June  113.5  I/O last 24 hours 2813/1535, 506/775 so far today  Lab- Hemoglobin 7.8, WBC 18,600, PLTs 117K Sodium 132, potassium 4.5, chloride 97, CO2 24, BUN 61, CR  3.48, calcium 8, albumin 2.6 PT 16.3, INR 1.34 U A. pH 6, SG 1.015, glucose ketones protein blood nitrite all negative, 0-2 wbc's, 0-2 RBCs O2 sat 97% on 40% FiO2  CXR--patchy infiltrates/vascular congestion in both lung fields PA pressure 38/25   Assessment/Plan: 1. Acute kidney injury-likely from hypotension/prolonged pump time. Currently non-oliguric on furosemide drip. Will increase to 20 mg per hour. the Chest x-ray looks wet. If renal function worsens or O2 sats drop, he may need dialysis/CV[facility] for ultrafiltration. Potassium OK 2. S./P. CABG, mitral valve repair and replacement, repair of PFO-per  TCTS 3. Anemia-check iron studies 4. Ventilator- per TCTS.  Discussed with Dr. Ovid Curd F 02/08/2013, 10:33 AM

## 2013-02-08 NOTE — Consult Note (Signed)
PULMONARY  / CRITICAL CARE MEDICINE  Name: Lonnie [name] MRN: [id number] DOB: 04-Jun-1946    ADMISSION DATE:  01/31/2013 CONSULTATION DATE:  02/08/2013  REFERRING MD :  Dorris Fetch PRIMARY SERVICE: CVTS  CHIEF COMPLAINT:  VDRF  BRIEF PATIENT DESCRIPTION: 67 year old male with CAD presents from [facility] with STEMI, CRI, had a cath then 3 vessel CABG mitral valve placement and closure of PFO.  Cr continued to rise post op up to 3.5 and has been on a lasix drip since on multiple pressors.  PCCM called bedside due to SVT with wide complex and inability to come off the vent.  SIGNIFICANT EVENTS / STUDIES:  5/29 CABG 5/30 evolving renal failure. 6/1 SVT with wide complex requiring cardioversion.  LINES / TUBES: ET tube 5/29>>> L IJ PAC 5/29>>> R radial a-line 5/29>>> CT 5/29>>>  CULTURES: Blood 6/1>>> Urine 6/1>>> Sputum 6/1>>>  ANTIBIOTICS: None  PAST MEDICAL HISTORY :  Past Medical History  Diagnosis Date  . High cholesterol   . Hypertension   . Constipation   . Reflux   . Complication of anesthesia     "Hard time waking me up" after sedation after dental procedure   Past Surgical History  Procedure Laterality Date  . Stomach ulcer repair    . Tee without cardioversion N/A 02/04/2013    Procedure: TRANSESOPHAGEAL ECHOCARDIOGRAM (TEE);  Surgeon: Vesta Mixer, MD;  Location: [facility] ENDOSCOPY;  Service: Cardiovascular;  Laterality: N/A;  . Intraoperative transesophageal echocardiogram N/A 02/05/2013    Procedure: INTRAOPERATIVE TRANSESOPHAGEAL ECHOCARDIOGRAM;  Surgeon: [name], MD;  Location: [facility] OR;  Service: Open Heart Surgery;  Laterality: N/A;  . Endovein harvest of greater saphenous vein Right 02/05/2013    Procedure: ENDOVEIN HARVEST OF GREATER SAPHENOUS VEIN;  Surgeon: [name], MD;  Location:  [facility] OR;  Service: Open Heart Surgery;  Laterality: Right;  . Patent foramen ovale closure N/A 02/05/2013    Procedure: PATENT FORAMEN OVALE CLOSURE;   Surgeon: [name], MD;  Location: [facility] OR;  Service: Open Heart Surgery;  Laterality: N/A;  . Mitral valve replacement (mvr)/coronary artery bypass grafting (cabg) N/A 02/05/2013    Procedure: MITRAL VALVE REPLACEMENT (MVR)/CORONARY ARTERY BYPASS GRAFTING (CABG);  Surgeon: [name], MD;  Location: [facility] OR;  Service: Open Heart Surgery;  Laterality: N/A;  x3 using right greater saphenous vein and left internal mammary.    Prior to Admission medications   Medication Sig Start Date End Date Taking? Authorizing Provider  Ascorbic Acid (VITAMIN C PO) Take 1 tablet by mouth daily.   Yes Historical Provider, MD  Omega-3 Fatty Acids (FISH OIL) 1200 MG CAPS Take 1,200 mg by mouth daily.   Yes Historical Provider, MD  ranitidine (ZANTAC) 150 MG tablet Take 150 mg by mouth 2 (two) times daily.   Yes Historical Provider, MD  VITAMIN E PO Take 1 capsule by mouth daily.   Yes Historical Provider, MD  Linaclotide Karlene Einstein) 145 MCG CAPS Take 1 capsule (145 mcg total) by mouth daily. 01/27/13   Len Blalock, NP  lisinopril (PRINIVIL,ZESTRIL) 10 MG tablet Take 10 mg by mouth daily.    Historical Provider, MD  simvastatin (ZOCOR) 10 MG tablet Take 10 mg by mouth at bedtime.    Historical Provider, MD   No Known Allergies  FAMILY HISTORY:  Family History  Problem Relation Age of Onset  . Heart attack Mother 30   SOCIAL HISTORY:  reports that he has quit smoking. He does not have any smokeless  tobacco history on file. He reports that  drinks alcohol. He reports that he does not use illicit drugs.  REVIEW OF SYSTEMS:  Unattainable, patient is sedated and intubated.  SUBJECTIVE:   VITAL SIGNS: Temp:  [98.1 F (36.7 C)-100 F (37.8 C)] 98.2 F (36.8 C) (06/01 1400) Pulse Rate:  [64-107] 69 (06/01 1703) Resp:  [10-20] 17 (06/01 1703) BP: (85-113)/(51-68) 113/57 mmHg (06/01 1703) SpO2:  [92 %-100 %] 96 % (06/01 1703) Arterial Line BP: (100-122)/(43-61) 102/56 mmHg (06/01 1400) FiO2  (%):  [40 %-50 %] 40 % (06/01 1703) Weight:  [113.5 kg (250 lb 3.6 oz)] 113.5 kg (250 lb 3.6 oz) (06/01 0400) HEMODYNAMICS: PAP: (34-43)/(20-27) 40/25 mmHg CO:  [5.5 L/min-8 L/min] 8 L/min CI:  [2.5 L/min/m2-3.6 L/min/m2] 3.6 L/min/m2 VENTILATOR SETTINGS: Vent Mode:  [-] PRVC FiO2 (%):  [40 %-50 %] 40 % Set Rate:  [4 bmp-12 bmp] 12 bmp Vt Set:  [700 mL] 700 mL PEEP:  [5 cmH20] 5 cmH20 Pressure Support:  [10 cmH20] 10 cmH20 Plateau Pressure:  [22 cmH20-28 cmH20] 23 cmH20 INTAKE / OUTPUT: Intake/Output     05/31 0701 - 06/01 0700 06/01 0701 - 06/02 0700   I.V. (mL/kg) 2732.7 (24.1) 1025.3 (9)   Blood     NG/GT 30 150   IV Piggyback 50    Total Intake(mL/kg) 2812.7 (24.8) 1175.3 (10.4)   Urine (mL/kg/hr) 1175 (0.4) 1225 (1)   Emesis/NG output 300 (0.1) 250 (0.2)   Chest Tube 60 (0)    Total Output 1535 1475   Net +1277.7 -299.7          PHYSICAL EXAMINATION: General:  Chronically ill appearing male, anasarca. Neuro:  Arousable and follow command. HEENT:  [place]/AT, PERRL, EOM-I and MMM. Cardiovascular:  RRR, Nl S1/S2, -M/R/G. Lungs:  Diffuse crackles. Abdomen:  Soft, NT, ND and +BS. Musculoskeletal:  2+ edema and -tenderness. Skin:  Intact, wound clean.  LABS:  Recent Labs Lab 02/05/13 0505  02/05/13 1920  02/06/13 0127  02/06/13 0531  02/06/13 1500 02/06/13 1501 02/06/13 1505 02/07/13 0400 02/08/13 0350 02/08/13 0913 02/08/13 1738  HGB 12.0*  < > 8.9*  < >  --   --  6.7*  < > 8.2*  --  9.2* 8.0* 7.8*  --   --   WBC 13.6*  --  25.3*  < >  --   --  17.2*  < > 16.6*  --   --  17.8* 18.6*  --   --   PLT 319  < > 117*  < >  --   --  95*  < > 92*  --   --  105* 117*  --   --   NA 136  < >  --   < >  --   --  136  --  136  --  137 135 132*  --   --   K 3.9  < >  --   < >  --   --  4.7  --  4.6  --  4.5 5.0 4.5  --   --   CL 90*  --   --   --   --   --  101  --  102  --  104 100 97  --   --   CO2 32  --   --   --   --   --  26  --  24  --   --  24 24  --   --  GLUCOSE  127*  < >  --   < >  --   --  124*  --  99  --  94 162* 118*  --   --   BUN 37*  --   --   --   --   --  34*  --  37*  --  34* 46* 61*  --   --   CREATININE 1.53*  --   --   --   --   --  1.68*  --  2.03*  --  2.00* 2.66* 3.48*  --   --   CALCIUM 9.6  --   --   --   --   --  8.9  --  8.4  --   --  8.2* 8.0*  --   --   MG 2.1  --   --   --   --   --  3.6*  --  3.3*  --   --   --   --   --   --   AST  --   --   --   --   --   --   --   --   --   --   --  80* 48*  --   --   ALT  --   --   --   --   --   --   --   --   --   --   --  43 39  --   --   ALKPHOS  --   --   --   --   --   --   --   --   --   --   --  57 66  --   --   BILITOT  --   --   --   --   --   --   --   --   --   --   --  0.7 0.5  --   --   PROT  --   --   --   --   --   --   --   --   --   --   --  5.3* 5.5*  --   --   ALBUMIN  --   --   --   --   --   --   --   --   --   --   --  2.9* 2.6*  --   --   APTT  --   --  46*  --  48*  --   --   --   --   --   --   --   --   --   --   INR  --   --  2.01*  --  1.70*  --   --   --   --   --   --   --  1.34  --   --   PHART  --   < > 7.314*  < >  --   < >  --   --   --  7.402  --   --   --  7.343* 7.372  PCO2ART  --   < > 48.1*  < >  --   < >  --   --   --  37.0  --   --   --  43.7 39.5  PO2ART  --   < > 54.0*  < >  --   < >  --   --   --  53.0*  --   --   --  65.0* 240.0*  < > = values in this interval not displayed.  Recent Labs Lab 02/07/13 2046 02/07/13 2312 02/08/13 0350 02/08/13 0758 02/08/13 1150  GLUCAP 123* 71 101* 75 104*    CXR: Diffuse pulmonary edema and airspace disease, support apparatus in place.  ASSESSMENT / PLAN:  PULMONARY A: VDRF due to pulmonary edema from cardiac disease and renal failure. P:   - Increase RR to 18 and PEEP to 10. - F/U ABG. - Continue diureses as tolerated.  CARDIOVASCULAR A: SVT on 6/1, poor EF, cardiogenic shock. P:  - Pressor support as ordered. - Milrinone. - Cards and CVTS involved.  RENAL A:  A/CRF. P:   - BMET now  including Mg and Phos. - BMET in AM. - Lasix drip as ordered.  GASTROINTESTINAL A:  No active issues. P:   - TF.  HEMATOLOGIC A:  Leukocytosis. P:  - Monitor CBC.  INFECTIOUS A:  No sign of overt infection, afebrile but WBC is rising, no abx. P:   - Hold off abx since afebrile. - Will order cultures. - Check swan parameters to assess filling pressures, unlikely septic shock.  ENDOCRINE A:  Hyperglycemia.   P:   - ISS.  NEUROLOGIC A:  Alert and interactive. P:   - Precedex drip. - Fentanyl PRN.  I have personally obtained a history, examined the patient, evaluated laboratory and imaging results, formulated the assessment and plan and placed orders.  CRITICAL CARE: The patient is critically ill with multiple organ systems failure and requires high complexity decision making for assessment and support, frequent evaluation and titration of therapies, application of advanced monitoring technologies and extensive interpretation of multiple databases. Critical Care Time devoted to patient care services described in this note is 35 minutes.   Alyson Reedy, M.D. Pulmonary and Critical Care Medicine [facility] Pager: [phone]  02/08/2013, 5:44 PM

## 2013-02-08 NOTE — Progress Notes (Addendum)
Called to see patient due to wide complex tachycardia.  Patient had recent inferior wall MI and underwent CABG.  2D echo at that time was 45-50%.  Post CABG EF was 60%.  Today he developed recurrent WCT at 170bpm with hypotension.  He had had a bout of PAF earlier in the day treated with a total of 300mg  of Amiodarone.  The initial strip appeared somewhat like polymorphic VT but then became monomorphic.  He was cardioverted with 75 joules to V paced at 90bpm.  His BP will not tolerate beta blockers at this time.  He is on Dopamine at 5 mcg, Levophed at and Milrinone at 0.39mcg.  Discussed with pharmacy.  He has significant renal failure.  They recommended we avoid Lidocaine unless a last resort due to risk of neurotoxicity.  Will continue to give IV amio boluses as needed.   A stat BMET will be obtained. Potassium was normal earlier in the day.

## 2013-02-08 NOTE — Progress Notes (Addendum)
Patient ID: Lonnie [name], male   DOB: [birth year], 67 y.o.   MRN: [id number] EVENING ROUNDS NOTE :     [address]       [place] [address]             [phone]                 3 Days Post-Op Procedure(s) (LRB): INTRAOPERATIVE TRANSESOPHAGEAL ECHOCARDIOGRAM (N/A) ENDOVEIN HARVEST OF GREATER SAPHENOUS VEIN (Right) PATENT FORAMEN OVALE CLOSURE (N/A) MITRAL VALVE REPLACEMENT (MVR)/CORONARY ARTERY BYPASS GRAFTING (CABG) (N/A)  Total Length of Stay:  LOS: 8 days  BP 113/57  Pulse 69  Temp(Src) 98.2 F (36.8 C) (Core (Comment))  Resp 17  Ht 6\' 1"  (1.854 m)  Wt 250 lb 3.6 oz (113.5 kg)  BMI 33.02 kg/m2  SpO2 96%  .Intake/Output     05/31 0701 - 06/01 0700 06/01 0701 - 06/02 0700   I.V. (mL/kg) 2732.7 (24.1) 1025.3 (9)   Blood     NG/GT 30 150   IV Piggyback 50    Total Intake(mL/kg) 2812.7 (24.8) 1175.3 (10.4)   Urine (mL/kg/hr) 1175 (0.4) 1225 (0.9)   Emesis/NG output 300 (0.1) 250 (0.2)   Chest Tube 60 (0)    Total Output 1535 1475   Net +1277.7 -299.7          . sodium chloride 20 mL/hr at 02/05/13 2015  . sodium chloride 20 mL/hr at 02/05/13 2015  . sodium chloride    . amiodarone (NEXTERONE PREMIX) 360 mg/200 mL dextrose 30 mg/hr (02/08/13 1000)  . dexmedetomidine 0.7 mcg/kg/hr (02/08/13 1600)  . DOPamine 5 mcg/kg/min (02/08/13 1600)  . furosemide (LASIX) infusion 10 mg/hr (02/08/13 1800)  . lactated ringers Stopped (02/07/13 0000)  . milrinone 0.3 mcg/kg/min (02/08/13 1000)  . nitroGLYCERIN Stopped (02/05/13 2000)  . norepinephrine (LEVOPHED) Adult infusion 4 mcg/min (02/08/13 1300)  . phenylephrine (NEO-SYNEPHRINE) Adult infusion Stopped (02/06/13 0530)     Lab Results  Component Value Date   WBC 16.2* 02/08/2013   HGB 7.2* 02/08/2013   HCT 21.3* 02/08/2013   PLT 104* 02/08/2013   GLUCOSE 118* 02/08/2013   CHOL 149 02/01/2013   TRIG 114 02/01/2013   HDL 31* 02/01/2013   LDLCALC 95 02/01/2013   ALT 39 02/08/2013   AST 48* 02/08/2013   NA 132*  02/08/2013   K 4.5 02/08/2013   CL 97 02/08/2013   CREATININE 3.48* 02/08/2013   BUN 61* 02/08/2013   CO2 24 02/08/2013   TSH 2.711 02/01/2013   INR 1.34 02/08/2013   HGBA1C 5.3 02/04/2013   Urine output has improved today, developed afib this morning, additional    Cordarone given 5:06 pm, developed wide complex tachycardia similar to preop, addition 150 Cordarone given. No response seen with adenosine.  Did not require chest compression but bp down and required cardioversion x1 at 75 j  Since has been paced v,at 90 A wires do not work.  Cardiology asked to see hct down inspite increased uop will transfuse 1 prbc ECHO pending Continue pressors and vent   Delight Ovens MD  Beeper [phone] Office [phone] 02/08/2013 6:25 PM

## 2013-02-08 NOTE — Progress Notes (Signed)
Patient developed wide complex tachycardia with a sustained rate in the 180s.  Patient became lethargic but did not lose consciousness.  Dr. Tyrone Sage, Pola Corn, and Dr. Mayford Knife paged.  Dr. Molli Knock and Dr. Tyrone Sage were at bedside and verbal orders given to administer Adenosine.  A total of 18mg  of Adenosine given, with no improvement of rhythm.  Patient was then cardioverted at 75 J.  Patient then able to be VVI paced at 90 bpm.  Orders received, see EPIC documentation.  Will continue to closely monitor and assess.  Keitha Butte, RN

## 2013-02-08 NOTE — Progress Notes (Signed)
Patient ID: Lonnie [name], male   DOB: 1946-01-27, 67 y.o.   MRN: [id number] TCTS DAILY PROGRESS NOTE                   [address]            [place] [address]          [phone]      3 Days Post-Op Procedure(s) (LRB): INTRAOPERATIVE TRANSESOPHAGEAL ECHOCARDIOGRAM (N/A) ENDOVEIN HARVEST OF GREATER SAPHENOUS VEIN (Right) PATENT FORAMEN OVALE CLOSURE (N/A) MITRAL VALVE REPLACEMENT (MVR)/CORONARY ARTERY BYPASS GRAFTING (CABG) (N/A)  Total Length of Stay:  LOS: 8 days   Subjective: Sedated but opens eyes to command  Objective: Vital signs in last 24 hours: Temp:  [98.8 F (37.1 C)-100 F (37.8 C)] 98.8 F (37.1 C) (06/01 0800) Pulse Rate:  [64-101] 101 (06/01 0800) Cardiac Rhythm:  [-] Heart block (06/01 0800) Resp:  [10-20] 13 (06/01 0800) BP: (85-105)/(46-68) 93/63 mmHg (06/01 0800) SpO2:  [92 %-100 %] 100 % (06/01 0800) Arterial Line BP: (100-119)/(43-59) 119/59 mmHg (06/01 0800) FiO2 (%):  [40 %-50 %] 40 % (06/01 0800) Weight:  [250 lb 3.6 oz (113.5 kg)] 250 lb 3.6 oz (113.5 kg) (06/01 0400)  Filed Weights   02/05/13 0440 02/07/13 0500 02/08/13 0400  Weight: 214 lb 4.6 oz (97.2 kg) 245 lb 13 oz (111.5 kg) 250 lb 3.6 oz (113.5 kg)    Weight change: 4 lb 6.5 oz (2 kg)   Hemodynamic parameters for last 24 hours: PAP: (36-43)/(18-27) 40/23 mmHg CO:  [5.5 L/min-7.8 L/min] 7.8 L/min CI:  [2.5 L/min/m2-3.5 L/min/m2] 3.5 L/min/m2  Intake/Output from previous day: 05/31 0701 - 06/01 0700 In: 2812.7 [I.V.:2732.7; NG/GT:30; IV Piggyback:50] Out: 1535 [Urine:1175; Emesis/NG output:300; Chest Tube:60]  Intake/Output this shift: Total I/O In: 184.2 [I.V.:154.2; NG/GT:30] Out: 450 [Urine:350; Emesis/NG output:100]  Current Meds: Scheduled Meds: . acetaminophen  1,000 mg Oral Q6H   Or  . acetaminophen (TYLENOL) oral liquid 160 mg/5 mL  975 mg Per Tube Q6H  . antiseptic oral rinse  15 mL Mouth Rinse QID  . aspirin EC  325 mg Oral Daily   Or  .  aspirin  324 mg Per Tube Daily  . atorvastatin  80 mg Oral q1800  . bisacodyl  10 mg Oral Daily   Or  . bisacodyl  10 mg Rectal Daily  . chlorhexidine  15 mL Mouth/Throat BID  . docusate  200 mg Per Tube Daily  . insulin aspart  0-24 Units Subcutaneous Q4H  . insulin detemir  20 Units Subcutaneous QHS  . metoprolol tartrate  12.5 mg Oral BID   Or  . metoprolol tartrate  12.5 mg Per Tube BID  . pantoprazole sodium  40 mg Per Tube Daily  . sodium chloride  3 mL Intravenous Q12H   Continuous Infusions: . sodium chloride 20 mL/hr at 02/05/13 2015  . sodium chloride 20 mL/hr at 02/05/13 2015  . sodium chloride    . amiodarone (NEXTERONE PREMIX) 360 mg/200 mL dextrose 30 mg/hr (02/08/13 0600)  . dexmedetomidine 0.7 mcg/kg/hr (02/08/13 0600)  . DOPamine 5 mcg/kg/min (02/08/13 0600)  . furosemide (LASIX) infusion 10 mg/hr (02/07/13 2100)  . lactated ringers Stopped (02/07/13 0000)  . milrinone 0.3 mcg/kg/min (02/08/13 0600)  . nitroGLYCERIN Stopped (02/05/13 2000)  . norepinephrine (LEVOPHED) Adult infusion 6 mcg/min (02/08/13 0600)  . phenylephrine (NEO-SYNEPHRINE) Adult infusion Stopped (02/06/13 0530)   PRN Meds:.metoprolol, midazolam, morphine injection, ondansetron (ZOFRAN) IV, oxyCODONE, sodium chloride  General appearance: sedated on vent but opens eyes and follows commands Neurologic: intact Heart: regular rate and rhythm, S1, S2 normal, no murmur, click, rub or gallop Lungs: diminished breath sounds bilaterally Abdomen: soft, non-tender; bowel sounds normal; no masses,  no organomegaly Extremities: extremities normal, atraumatic, no cyanosis or edema and Homans sign is negative, no sign of DVT Wound: sternum stable  Lab Results: CBC:  Recent Labs  02/07/13 0400 02/08/13 0350  WBC 17.8* 18.6*  HGB 8.0* 7.8*  HCT 23.0* 23.2*  PLT 105* 117*   BMET:   Recent Labs  02/07/13 0400 02/08/13 0350  NA 135 132*  K 5.0 4.5  CL 100 97  CO2 24 24  GLUCOSE 162* 118*    BUN 46* 61*  CREATININE 2.66* 3.48*  CALCIUM 8.2* 8.0*    liver function tests Lab Results  Component Value Date   ALT 39 02/08/2013   AST 48* 02/08/2013   ALKPHOS 66 02/08/2013   BILITOT 0.5 02/08/2013    PT/INR:   Recent Labs  02/08/13 0350  LABPROT 16.3*  INR 1.34   Radiology: Dg Chest Port 1 View  02/08/2013   *RADIOLOGY REPORT*  Clinical Data: Chest tube placement  PORTABLE CHEST - 1 VIEW  Comparison:   the previous day's study  Findings: Endotracheal tube, nasogastric tube, right IJ Swan-Ganz catheter, and left chest tube are stable in position.  No pneumothorax.  Moderate diffuse interstitial and alveolar opacities throughout both lungs without convincing interval change.  No definite effusion.  Heart size upper limits normal for technique. Changes of CABG and valve surgery.  IMPRESSION:  1.  Stable appearance since previous day's portable exam   Original Report Authenticated By: D. Andria Rhein, MD   Dg Chest Port 1 View  02/07/2013   *RADIOLOGY REPORT*  Clinical Data: Edema  PORTABLE CHEST - 1 VIEW  Comparison:   the previous day's study  Findings: Endotracheal tube, nasogastric tube, right IJ Swan-Ganz, and left chest tubes are stable in position.  No pneumothorax. Previous CABG and valve surgery.  Moderate bilateral interstitial and airspace edema or infiltrates, not convincingly changed from previous exam, slightly more prominent on the left than right.  No definite effusion.  IMPRESSION:  1.  Little definite change since previous day's portable exam   Original Report Authenticated By: D. Andria Rhein, MD       Assessment/Plan: S/P Procedure(s) (LRB): INTRAOPERATIVE TRANSESOPHAGEAL ECHOCARDIOGRAM (N/A) ENDOVEIN HARVEST OF GREATER SAPHENOUS VEIN (Right) PATENT FORAMEN OVALE CLOSURE (N/A) MITRAL VALVE REPLACEMENT (MVR)/CORONARY ARTERY BYPASS GRAFTING (CABG) (N/A)  POD # 3 CV- stable BP, no significant arrythmias  Continue IV amiodarone  Still on dopamine and levophed and  milrinone,  wean levophed as tolerated continue dopamine   RESP- pulmonary edema- diureses limited by renal function 1100 ml uop past 24 hours , 350 overnight  Wean FiO2/PEEP now on 40 % but with progressive renal failure not ready to extubate yet RENAL- creatinine continues to increase , but some increase in urine output still on lasix drip  Anemia secondary to ABL- hgb stable 24 hours Thrombocytopenia-follow , now up to 117,000 ENDO- CBG  Controlled  insulin drip off glucose low, will decrease insulin Ask renal to see  Carriann Hesse B 02/08/2013 8:49 AM

## 2013-02-08 NOTE — Progress Notes (Signed)
Patient went into A fib with a rate of 100-115 bpm.  Dr. Tyrone Sage paged and telephone order received to give 150mg  bolus of IV Amiodarone.  Will continue to assess.  Keitha Butte, RN

## 2013-02-08 NOTE — Progress Notes (Signed)
     Echocardiogram completed.      Michaelia Beilfuss, RVT 02/08/2013, 7:41 PM

## 2013-02-08 NOTE — Procedures (Signed)
Synchronized Cardioversion  Called bedside with patient in SVT.  Patient already on amiodarone drip.  Loaded again without effect.  Adenosine tried 6 then 12 without effect.  Decision was made to cardiovert given hypotension.  Patient was given etomidate 20 mg x1 then after than synch cardioversion with 75 joules.  Patient was successfully cardioverted.  Pressors restarted for hypotension and became stable.  Cardiology present bedside.  Alyson Reedy, M.D. [facility] Pulmonary/Critical Care Medicine. Pager: [phone]. After hours pager: [phone].

## 2013-02-09 ENCOUNTER — Inpatient Hospital Stay (HOSPITAL_COMMUNITY): Payer: Managed Care, Other (non HMO)

## 2013-02-09 DIAGNOSIS — I472 Ventricular tachycardia, unspecified: Secondary | ICD-10-CM

## 2013-02-09 LAB — BLOOD GAS, ARTERIAL
Bicarbonate: 25 mEq/L — ABNORMAL HIGH (ref 20.0–24.0)
MECHVT: 640 mL
O2 Saturation: 99.2 %
PEEP: 10 cmH2O
Patient temperature: 98.6
RATE: 18 resp/min
pH, Arterial: 7.455 — ABNORMAL HIGH (ref 7.350–7.450)

## 2013-02-09 LAB — CBC
HCT: 24.1 % — ABNORMAL LOW (ref 39.0–52.0)
Hemoglobin: 8.4 g/dL — ABNORMAL LOW (ref 13.0–17.0)
MCH: 30.4 pg (ref 26.0–34.0)
MCHC: 34.9 g/dL (ref 30.0–36.0)
MCV: 87.3 fL (ref 78.0–100.0)
Platelets: 119 10*3/uL — ABNORMAL LOW (ref 150–400)
RBC: 2.76 MIL/uL — ABNORMAL LOW (ref 4.22–5.81)
RDW: 14.1 % (ref 11.5–15.5)
WBC: 14 10*3/uL — ABNORMAL HIGH (ref 4.0–10.5)

## 2013-02-09 LAB — BASIC METABOLIC PANEL
CO2: 27 mEq/L (ref 19–32)
Calcium: 8.6 mg/dL (ref 8.4–10.5)
Chloride: 97 mEq/L (ref 96–112)
Glucose, Bld: 90 mg/dL (ref 70–99)
Sodium: 137 mEq/L (ref 135–145)

## 2013-02-09 LAB — GLUCOSE, CAPILLARY
Glucose-Capillary: 127 mg/dL — ABNORMAL HIGH (ref 70–99)
Glucose-Capillary: 87 mg/dL (ref 70–99)
Glucose-Capillary: 97 mg/dL (ref 70–99)
Glucose-Capillary: 67 mg/dL — ABNORMAL LOW (ref 70–99)
Glucose-Capillary: 120 mg/dL — ABNORMAL HIGH (ref 70–99)

## 2013-02-09 LAB — TYPE AND SCREEN
ABO/RH(D): O POS
Antibody Screen: NEGATIVE
Unit division: 0

## 2013-02-09 LAB — POCT I-STAT, CHEM 8
BUN: 53 mg/dL — ABNORMAL HIGH (ref 6–23)
Chloride: 101 mEq/L (ref 96–112)
Creatinine, Ser: 2.6 mg/dL — ABNORMAL HIGH (ref 0.50–1.35)
Glucose, Bld: 113 mg/dL — ABNORMAL HIGH (ref 70–99)
Hemoglobin: 7.1 g/dL — ABNORMAL LOW (ref 13.0–17.0)
Potassium: 3.5 mEq/L (ref 3.5–5.1)
Sodium: 136 mEq/L (ref 135–145)

## 2013-02-09 LAB — COMPREHENSIVE METABOLIC PANEL
ALT: 35 U/L (ref 0–53)
AST: 35 U/L (ref 0–37)
Albumin: 2.5 g/dL — ABNORMAL LOW (ref 3.5–5.2)
Alkaline Phosphatase: 69 U/L (ref 39–117)
BUN: 62 mg/dL — ABNORMAL HIGH (ref 6–23)
CO2: 25 mEq/L (ref 19–32)
Calcium: 8.3 mg/dL — ABNORMAL LOW (ref 8.4–10.5)
Chloride: 96 mEq/L (ref 96–112)
Creatinine, Ser: 2.72 mg/dL — ABNORMAL HIGH (ref 0.50–1.35)
GFR calc Af Amer: 26 mL/min — ABNORMAL LOW (ref >90)
GFR calc non Af Amer: 23 mL/min — ABNORMAL LOW (ref >90)
Glucose, Bld: 89 mg/dL (ref 70–99)
Potassium: 3.4 mEq/L — ABNORMAL LOW (ref 3.5–5.1)
Sodium: 133 mEq/L — ABNORMAL LOW (ref 135–145)
Total Bilirubin: 0.6 mg/dL (ref 0.3–1.2)
Total Protein: 5.5 g/dL — ABNORMAL LOW (ref 6.0–8.3)

## 2013-02-09 LAB — PROTIME-INR
INR: 1.32 (ref 0.00–1.49)
Prothrombin Time: 16.1 seconds — ABNORMAL HIGH (ref 11.6–15.2)

## 2013-02-09 LAB — MAGNESIUM: Magnesium: 2.7 mg/dL — ABNORMAL HIGH (ref 1.5–2.5)

## 2013-02-09 LAB — PRO B NATRIURETIC PEPTIDE: Pro B Natriuretic peptide (BNP): 16248 pg/mL — ABNORMAL HIGH (ref 0–125)

## 2013-02-09 LAB — PHOSPHORUS: Phosphorus: 5.3 mg/dL — ABNORMAL HIGH (ref 2.3–4.6)

## 2013-02-09 MED ORDER — DEXTROSE 5 % IV SOLN
2.0000 g | INTRAVENOUS | Status: AC
  Administered 2013-02-09 – 2013-02-15 (×7): 2 g via INTRAVENOUS
  Filled 2013-02-09 (×8): qty 2

## 2013-02-09 MED ORDER — WARFARIN SODIUM 5 MG IV SOLR
2.5000 mg | Freq: Every day | INTRAVENOUS | Status: DC
  Administered 2013-02-09 – 2013-02-11 (×3): 2.5 mg via INTRAVENOUS
  Filled 2013-02-09 (×4): qty 1.25

## 2013-02-09 MED ORDER — POTASSIUM CHLORIDE 10 MEQ/50ML IV SOLN
10.0000 meq | INTRAVENOUS | Status: AC
  Administered 2013-02-09 (×2): 10 meq via INTRAVENOUS

## 2013-02-09 MED ORDER — DEXTROSE 50 % IV SOLN
INTRAVENOUS | Status: AC
  Filled 2013-02-09: qty 50

## 2013-02-09 MED ORDER — ASPIRIN 81 MG PO CHEW
81.0000 mg | CHEWABLE_TABLET | Freq: Every day | ORAL | Status: DC
  Administered 2013-02-11: 81 mg
  Filled 2013-02-09: qty 1

## 2013-02-09 MED ORDER — WARFARIN - PHYSICIAN DOSING INPATIENT
Freq: Every day | Status: DC
  Administered 2013-02-09: 1
  Administered 2013-02-10 – 2013-02-16 (×2)

## 2013-02-09 MED ORDER — ENOXAPARIN SODIUM 30 MG/0.3ML ~~LOC~~ SOLN
30.0000 mg | SUBCUTANEOUS | Status: DC
  Administered 2013-02-09 – 2013-02-12 (×4): 30 mg via SUBCUTANEOUS
  Filled 2013-02-09 (×5): qty 0.3

## 2013-02-09 MED ORDER — DEXTROSE 50 % IV SOLN
25.0000 mL | Freq: Once | INTRAVENOUS | Status: AC | PRN
  Administered 2013-02-09: 25 mL via INTRAVENOUS

## 2013-02-09 MED ORDER — POTASSIUM CHLORIDE 10 MEQ/50ML IV SOLN
10.0000 meq | INTRAVENOUS | Status: DC
  Administered 2013-02-09 (×3): 10 meq via INTRAVENOUS
  Filled 2013-02-09: qty 150
  Filled 2013-02-09: qty 100

## 2013-02-09 MED ORDER — ASPIRIN EC 81 MG PO TBEC
81.0000 mg | DELAYED_RELEASE_TABLET | Freq: Every day | ORAL | Status: DC
  Administered 2013-02-09 – 2013-02-17 (×8): 81 mg via ORAL
  Filled 2013-02-09 (×9): qty 1

## 2013-02-09 MED ORDER — POTASSIUM CHLORIDE 10 MEQ/50ML IV SOLN
INTRAVENOUS | Status: AC
  Administered 2013-02-09: 10 meq via INTRAVENOUS
  Filled 2013-02-09: qty 200

## 2013-02-09 MED ORDER — MILRINONE IN DEXTROSE 20 MG/100ML IV SOLN: 0.1500 ug/kg/min | INTRAVENOUS | Status: AC

## 2013-02-09 MED ORDER — POTASSIUM CHLORIDE 10 MEQ/50ML IV SOLN
10.0000 meq | INTRAVENOUS | Status: AC
  Administered 2013-02-09 (×3): 10 meq via INTRAVENOUS

## 2013-02-09 MED FILL — Heparin Sodium (Porcine) Inj 1000 Unit/ML: INTRAMUSCULAR | Qty: 40 | Status: AC

## 2013-02-09 MED FILL — Heparin Sodium (Porcine) Inj 1000 Unit/ML: INTRAMUSCULAR | Qty: 60 | Status: AC

## 2013-02-09 MED FILL — Lidocaine HCl IV Inj 20 MG/ML: INTRAVENOUS | Qty: 10 | Status: AC

## 2013-02-09 MED FILL — Mannitol IV Soln 20%: INTRAVENOUS | Qty: 500 | Status: AC

## 2013-02-09 MED FILL — Sodium Chloride Irrigation Soln 0.9%: Qty: 6000 | Status: AC

## 2013-02-09 MED FILL — Electrolyte-R (PH 7.4) Solution: INTRAVENOUS | Qty: 5000 | Status: AC

## 2013-02-09 MED FILL — Sodium Bicarbonate IV Soln 8.4%: INTRAVENOUS | Qty: 50 | Status: AC

## 2013-02-09 MED FILL — Calcium Chloride Inj 10%: INTRAVENOUS | Qty: 10 | Status: AC

## 2013-02-09 MED FILL — Sodium Chloride IV Soln 0.9%: INTRAVENOUS | Qty: 1000 | Status: AC

## 2013-02-09 NOTE — Progress Notes (Signed)
4 Days Post-Op Procedure(s) (LRB): INTRAOPERATIVE TRANSESOPHAGEAL ECHOCARDIOGRAM (N/A) ENDOVEIN HARVEST OF GREATER SAPHENOUS VEIN (Right) PATENT FORAMEN OVALE CLOSURE (N/A) MITRAL VALVE REPLACEMENT (MVR)/CORONARY ARTERY BYPASS GRAFTING (CABG) (N/A) Subjective: Intubated, cooperative  Objective: Vital signs in last 24 hours: Temp:  [97.9 F (36.6 C)-98.8 F (37.1 C)] 97.9 F (36.6 C) (06/02 0700) Pulse Rate:  [55-184] 90 (06/02 0700) Cardiac Rhythm:  [-] Ventricular paced (06/02 0400) Resp:  [0-23] 18 (06/02 0700) BP: (66-133)/(45-82) 99/65 mmHg (06/02 0700) SpO2:  [74 %-100 %] 98 % (06/02 0700) Arterial Line BP: (53-145)/(29-69) 102/53 mmHg (06/02 0700) FiO2 (%):  [40 %-100 %] 50 % (06/02 0400) Weight:  [246 lb 4.1 oz (111.7 kg)] 246 lb 4.1 oz (111.7 kg) (06/02 0500)  Hemodynamic parameters for last 24 hours: PAP: (25-43)/(17-28) 27/17 mmHg CO:  [4.7 L/min-8 L/min] 4.7 L/min CI:  [2.1 L/min/m2-3.6 L/min/m2] 2.1 L/min/m2  Intake/Output from previous day: 06/01 0701 - 06/02 0700 In: 4047.5 [I.V.:3027.5; Blood:350; NG/GT:270; IV Piggyback:400] Out: 6385 [Urine:5775; Emesis/NG output:600; Chest Tube:10] Intake/Output this shift:    General appearance: alert and cooperative Neurologic: no focal deficit' Heart: regular rate and rhythm Lungs: clear to auscultation bilaterally Abdomen: normal findings: soft, non-tender  Lab Results:  Recent Labs  02/08/13 1645 02/09/13 0336  WBC 16.2* 14.0*  HGB 7.2* 8.4*  HCT 21.3* 24.1*  PLT 104* 119*   BMET:  Recent Labs  02/08/13 0350 02/08/13 1645 02/09/13 0336  NA 132*  --  133*  K 4.5  --  3.4*  CL 97  --  96  CO2 24  --  25  GLUCOSE 118*  --  89  BUN 61*  --  62*  CREATININE 3.48* 3.11* 2.72*  CALCIUM 8.0*  --  8.3*    PT/INR:  Recent Labs  02/09/13 0336  LABPROT 16.1*  INR 1.32   ABG    Component Value Date/Time   PHART 7.455* 02/09/2013 0500   HCO3 25.0* 02/09/2013 0500   TCO2 26.1 02/09/2013 0500   ACIDBASEDEF 2.0 02/08/2013 1738   O2SAT 99.2 02/09/2013 0500   CBG (last 3)   Recent Labs  02/08/13 0350 02/08/13 0758 02/08/13 1150  GLUCAP 101* 75 104*    Assessment/Plan: S/P Procedure(s) (LRB): INTRAOPERATIVE TRANSESOPHAGEAL ECHOCARDIOGRAM (N/A) ENDOVEIN HARVEST OF GREATER SAPHENOUS VEIN (Right) PATENT FORAMEN OVALE CLOSURE (N/A) MITRAL VALVE REPLACEMENT (MVR)/CORONARY ARTERY BYPASS GRAFTING (CABG) (N/A) - CV- stable hemodynamics, good index with low filling pressures  Relatively long PR- 1ST degree heart block  VT- stable overnight continue amiodarone and lopressor  RESP- VDRF secondary to CHF- acute systolic and diastolic  Good gas exchange on 50% and 5 of PEEP- will attempt to wean  Thick sputum- Probable bronchitis- sputum culture, ceftriaxone   RENAL- acute renal failure secondary to CHF/ cardiopulmonary bypass- resolving, now nonoliguric  Continue lasix gtt  Hypokalemia- replete K  ID- see resp above  ENDO- CBG well controlled  Anticoagulation- has SCD hose, will add lovenox now that PLT count improving  Coumadin for artificial valve- plan 6-8 weeks for pericardial valve        LOS: 9 days    Shanon Seawright C 02/09/2013

## 2013-02-09 NOTE — Progress Notes (Addendum)
TCTS BRIEF SICU PROGRESS NOTE  4 Days Post-Op  S/P Procedure(s) (LRB): INTRAOPERATIVE TRANSESOPHAGEAL ECHOCARDIOGRAM (N/A) ENDOVEIN HARVEST OF GREATER SAPHENOUS VEIN (Right) PATENT FORAMEN OVALE CLOSURE (N/A) MITRAL VALVE REPLACEMENT (MVR)/CORONARY ARTERY BYPASS GRAFTING (CABG) (N/A)   Awake and alert on vent NSR w/ some occasional PVC's and ventricular trigeminy, no further runs of SVT or VT Stable hemodynamics Diuresing very well Repeat K+ 3.6 this afternoon CBG's running low  Plan: Supplement K+ and stop levemir insulin  Marylynn Rigdon H 02/09/2013 8:14 PM

## 2013-02-09 NOTE — Progress Notes (Addendum)
Subjective: Patient awake  Nods Objective: Filed Vitals:   02/09/13 1100 02/09/13 1200 02/09/13 1300 02/09/13 1400  BP: 107/66 98/68 95/64  99/72  Pulse: 84 88 87 88  Temp: 97.5 F (36.4 C) 97.7 F (36.5 C) 97.9 F (36.6 C) 97.9 F (36.6 C)  TempSrc:      Resp: 22 18 16 16   Height:      Weight:      SpO2: 99% 93% 94% 94%   Weight change: -3 lb 15.5 oz (-1.8 kg)  Intake/Output Summary (Last 24 hours) at 02/09/13 1409 Last data filed at 02/09/13 1307  Gross per 24 hour  Intake 2941.76 ml  Output   7085 ml  Net -4143.24 ml    General: Awake  APears fairly comfortable Neck:  JVP is difficult to assess Heart: Regular rate and rhythm, without murmurs, rubs, gallops.  Lungs: Coarse BS Exemities:  No edema.   .  Tele:  Sustained VT yesterday  SR today.  Lab Results: Results for orders placed during the hospital encounter of 01/31/13 (from the past 24 hour(s))  CREATININE, SERUM     Status: Abnormal   Collection Time    02/08/13  4:45 PM      Result Value Range   Creatinine, Ser 3.11 (*) 0.50 - 1.35 mg/dL   GFR calc non Af Amer 19 (*) >90 mL/min   GFR calc Af Amer 22 (*) >90 mL/min  MAGNESIUM     Status: Abnormal   Collection Time    02/08/13  4:45 PM      Result Value Range   Magnesium 2.8 (*) 1.5 - 2.5 mg/dL  CBC     Status: Abnormal   Collection Time    02/08/13  4:45 PM      Result Value Range   WBC 16.2 (*) 4.0 - 10.5 K/uL   RBC 2.40 (*) 4.22 - 5.81 MIL/uL   Hemoglobin 7.2 (*) 13.0 - 17.0 g/dL   HCT 16.1 (*) 09.6 - 04.5 %   MCV 88.8  78.0 - 100.0 fL   MCH 30.0  26.0 - 34.0 pg   MCHC 33.8  30.0 - 36.0 g/dL   RDW 40.9  81.1 - 91.4 %   Platelets 104 (*) 150 - 400 K/uL  POCT I-STAT 3, BLOOD GAS (G3+)     Status: Abnormal   Collection Time    02/08/13  5:38 PM      Result Value Range   pH, Arterial 7.372  7.350 - 7.450   pCO2 arterial 39.5  35.0 - 45.0 mmHg   pO2, Arterial 240.0 (*) 80.0 - 100.0 mmHg   Bicarbonate 23.0  20.0 - 24.0 mEq/L   TCO2 24  0 -  100 mmol/L   O2 Saturation 100.0     Acid-base deficit 2.0  0.0 - 2.0 mmol/L   Patient temperature 37.0 C     Collection site ARTERIAL LINE     Drawn by Nurse     Sample type ARTERIAL    URINALYSIS, ROUTINE W REFLEX MICROSCOPIC     Status: Abnormal   Collection Time    02/08/13  6:59 PM      Result Value Range   Color, Urine YELLOW  YELLOW   APPearance CLEAR  CLEAR   Specific Gravity, Urine 1.014  1.005 - 1.030   pH 5.0  5.0 - 8.0   Glucose, UA NEGATIVE  NEGATIVE mg/dL   Hgb urine dipstick SMALL (*) NEGATIVE   Bilirubin Urine NEGATIVE  NEGATIVE  Ketones, ur NEGATIVE  NEGATIVE mg/dL   Protein, ur NEGATIVE  NEGATIVE mg/dL   Urobilinogen, UA 0.2  0.0 - 1.0 mg/dL   Nitrite NEGATIVE  NEGATIVE   Leukocytes, UA NEGATIVE  NEGATIVE  URINE MICROSCOPIC-ADD ON     Status: None   Collection Time    02/08/13  6:59 PM      Result Value Range   Squamous Epithelial / LPF RARE  RARE   RBC / HPF 0-2  <3 RBC/hpf  PREPARE RBC (CROSSMATCH)     Status: None   Collection Time    02/08/13  7:00 PM      Result Value Range   Order Confirmation ORDER PROCESSED BY BLOOD BANK    TYPE AND SCREEN     Status: None   Collection Time    02/08/13  7:00 PM      Result Value Range   ABO/RH(D) O POS     Antibody Screen NEG     Sample Expiration 02/11/2013     Unit Number [id number]     Blood Component Type RED CELLS,LR     Unit division 00     Status of Unit ISSUED,FINAL     Transfusion Status OK TO TRANSFUSE     Crossmatch Result Compatible    COMPREHENSIVE METABOLIC PANEL     Status: Abnormal   Collection Time    02/09/13  3:36 AM      Result Value Range   Sodium 133 (*) 135 - 145 mEq/L   Potassium 3.4 (*) 3.5 - 5.1 mEq/L   Chloride 96  96 - 112 mEq/L   CO2 25  19 - 32 mEq/L   Glucose, Bld 89  70 - 99 mg/dL   BUN 62 (*) 6 - 23 mg/dL   Creatinine, Ser 5.28 (*) 0.50 - 1.35 mg/dL   Calcium 8.3 (*) 8.4 - 10.5 mg/dL   Total Protein 5.5 (*) 6.0 - 8.3 g/dL   Albumin 2.5 (*) 3.5 - 5.2 g/dL   AST  35  0 - 37 U/L   ALT 35  0 - 53 U/L   Alkaline Phosphatase 69  39 - 117 U/L   Total Bilirubin 0.6  0.3 - 1.2 mg/dL   GFR calc non Af Amer 23 (*) >90 mL/min   GFR calc Af Amer 26 (*) >90 mL/min  CBC     Status: Abnormal   Collection Time    02/09/13  3:36 AM      Result Value Range   WBC 14.0 (*) 4.0 - 10.5 K/uL   RBC 2.76 (*) 4.22 - 5.81 MIL/uL   Hemoglobin 8.4 (*) 13.0 - 17.0 g/dL   HCT 41.3 (*) 24.4 - 01.0 %   MCV 87.3  78.0 - 100.0 fL   MCH 30.4  26.0 - 34.0 pg   MCHC 34.9  30.0 - 36.0 g/dL   RDW 27.2  53.6 - 64.4 %   Platelets 119 (*) 150 - 400 K/uL  PROTIME-INR     Status: Abnormal   Collection Time    02/09/13  3:36 AM      Result Value Range   Prothrombin Time 16.1 (*) 11.6 - 15.2 seconds   INR 1.32  0.00 - 1.49  PHOSPHORUS     Status: Abnormal   Collection Time    02/09/13  3:36 AM      Result Value Range   Phosphorus 5.3 (*) 2.3 - 4.6 mg/dL  MAGNESIUM     Status: Abnormal  Collection Time    02/09/13  3:36 AM      Result Value Range   Magnesium 2.7 (*) 1.5 - 2.5 mg/dL  PRO B NATRIURETIC PEPTIDE     Status: Abnormal   Collection Time    02/09/13  3:36 AM      Result Value Range   Pro B Natriuretic peptide (BNP) 16248.0 (*) 0 - 125 pg/mL  BLOOD GAS, ARTERIAL     Status: Abnormal   Collection Time    02/09/13  5:00 AM      Result Value Range   FIO2 0.60     Delivery systems VENTILATOR     Mode PRESSURE REGULATED VOLUME CONTROL     VT 640     Rate 18     Peep/cpap 10.0     pH, Arterial 7.455 (*) 7.350 - 7.450   pCO2 arterial 36.0  35.0 - 45.0 mmHg   pO2, Arterial 152.0 (*) 80.0 - 100.0 mmHg   Bicarbonate 25.0 (*) 20.0 - 24.0 mEq/L   TCO2 26.1  0 - 100 mmol/L   Acid-Base Excess 1.4  0.0 - 2.0 mmol/L   O2 Saturation 99.2     Patient temperature 98.6     Collection site A-LINE     Drawn by RN     Sample type ARTERIAL DRAW    GLUCOSE, CAPILLARY     Status: None   Collection Time    02/09/13 11:57 AM      Result Value Range   Glucose-Capillary 96  70  - 99 mg/dL   Comment 1 Documented in Chart     Comment 2 Notify RN      Studies/Results: @[email or web address] @  Medications: Reviewed   @PROBHOSP @  Patient POD 4   Long episode of VT yesterday.  Cardioverted with 75 J  Treated with amiodarone boluses I would continue amio load.  Will continue to follow.  LOS: 9 days   Dietrich Pates 02/09/2013, 2:09 PM

## 2013-02-09 NOTE — Progress Notes (Signed)
Patient ID: Lonnie [name], male   DOB: October 30, 1945, 67 y.o.   MRN: [id number] S:intubated but awake/alert O:BP 99/65  Pulse 90  Temp(Src) 97.9 F (36.6 C) (Core (Comment))  Resp 18  Ht 6\' 1"  (1.854 m)  Wt 111.7 kg (246 lb 4.1 oz)  BMI 32.5 kg/m2  SpO2 98%  Intake/Output Summary (Last 24 hours) at 02/09/13 0934 Last data filed at 02/09/13 0800  Gross per 24 hour  Intake 3715.16 ml  Output   6560 ml  Net -2844.84 ml   Intake/Output: I/O last 3 completed shifts: In: 5296.9 [I.V.:4276.9; Blood:350; NG/GT:270; IV Piggyback:400] Out: 2130 [QMVHQ:4696; Emesis/NG output:800; Chest Tube:40]  Intake/Output this shift:  Total I/O In: -  Out: 775 [Urine:775] Weight change: -1.8 kg (-3 lb 15.5 oz) Gen:WD WN WM intubated but awake/alert CVS:RRR no rub Resp:decreased BS at bases EXB:MWUXLK Ext:+2 pretib edema   Recent Labs Lab 02/04/13 1020 02/05/13 0505  02/06/13 0110 02/06/13 0531 02/06/13 1500 02/06/13 1505 02/07/13 0400 02/08/13 0350 02/08/13 1645 02/09/13 0336  NA 138 136  < > 137 136 136 137 135 132*  --  133*  K 4.0 3.9  < > 5.1 4.7 4.6 4.5 5.0 4.5  --  3.4*  CL 93* 90*  --   --  101 102 104 100 97  --  96  CO2 36* 32  --   --  26 24  --  24 24  --  25  GLUCOSE 111* 127*  < > 126* 124* 99 94 162* 118*  --  89  BUN 37* 37*  --   --  34* 37* 34* 46* 61*  --  62*  CREATININE 1.43* 1.53*  --   --  1.68* 2.03* 2.00* 2.66* 3.48* 3.11* 2.72*  ALBUMIN  --   --   --   --   --   --   --  2.9* 2.6*  --  2.5*  CALCIUM 9.5 9.6  --   --  8.9 8.4  --  8.2* 8.0*  --  8.3*  PHOS  --   --   --   --   --   --   --   --   --   --  5.3*  AST  --   --   --   --   --   --   --  80* 48*  --  35  ALT  --   --   --   --   --   --   --  43 39  --  35  < > = values in this interval not displayed. Liver Function Tests:  Recent Labs Lab 02/07/13 0400 02/08/13 0350 02/09/13 0336  AST 80* 48* 35  ALT 43 39 35  ALKPHOS 57 66 69  BILITOT 0.7 0.5 0.6  PROT 5.3* 5.5* 5.5*  ALBUMIN 2.9*  2.6* 2.5*   No results found for this basename: LIPASE, AMYLASE,  in the last 168 hours No results found for this basename: AMMONIA,  in the last 168 hours CBC:  Recent Labs Lab 02/06/13 1500  02/07/13 0400 02/08/13 0350 02/08/13 1645 02/09/13 0336  WBC 16.6*  --  17.8* 18.6* 16.2* 14.0*  HGB 8.2*  < > 8.0* 7.8* 7.2* 8.4*  HCT 22.5*  < > 23.0* 23.2* 21.3* 24.1*  MCV 87.5  --  88.8 90.3 88.8 87.3  PLT 92*  --  105* 117* 104* 119*  < > = values in this  interval not displayed. Cardiac Enzymes: No results found for this basename: CKTOTAL, CKMB, CKMBINDEX, TROPONINI,  in the last 168 hours CBG:  Recent Labs Lab 02/07/13 2046 02/07/13 2312 02/08/13 0350 02/08/13 0758 02/08/13 1150  GLUCAP 123* 71 101* 75 104*    Iron Studies:  Recent Labs  02/08/13 1145  IRON 12*  TIBC 181*  FERRITIN 493*   Studies/Results: Dg Chest Port 1 View  02/09/2013   *RADIOLOGY REPORT*  Clinical Data: Endotracheal tube placement, history of cardiac surgery for mitral valve replacement and repair of a patent foramen ovale as well as CABG  PORTABLE CHEST - 1 VIEW  Comparison: Portable chest x-ray of 02/08/2013  Findings: The tip of the endotracheal tube is approximately 4.5 cm above the carina.  Aeration has improved slightly.  There is however little change in diffuse airspace disease left greater than right and cardiomegaly is stable.  The Swan-Ganz catheter and left chest tube remain with no pneumothorax noted.  IMPRESSION:  1.  Little change to minimal improved aeration. 2.  Little change in diffuse airspace disease. 3.  Tip of endotracheal tube 4.5 cm above the carina.   Original Report Authenticated By: Dwyane Dee, M.D.   Dg Chest Port 1 View  02/08/2013   *RADIOLOGY REPORT*  Clinical Data: Chest tube placement  PORTABLE CHEST - 1 VIEW  Comparison:   the previous day's study  Findings: Endotracheal tube, nasogastric tube, right IJ Swan-Ganz catheter, and left chest tube are stable in position.  No  pneumothorax.  Moderate diffuse interstitial and alveolar opacities throughout both lungs without convincing interval change.  No definite effusion.  Heart size upper limits normal for technique. Changes of CABG and valve surgery.  IMPRESSION:  1.  Stable appearance since previous day's portable exam   Original Report Authenticated By: D. Deanne Coffer III, MD   . acetaminophen  1,000 mg Oral Q6H   Or  . acetaminophen (TYLENOL) oral liquid 160 mg/5 mL  975 mg Per Tube Q6H  . antiseptic oral rinse  15 mL Mouth Rinse QID  . aspirin EC  81 mg Oral Daily   Or  . aspirin  81 mg Per Tube Daily  . atorvastatin  80 mg Oral q1800  . bisacodyl  10 mg Oral Daily   Or  . bisacodyl  10 mg Rectal Daily  . cefTRIAXone (ROCEPHIN)  IV  2 g Intravenous Q24H  . chlorhexidine  15 mL Mouth/Throat BID  . docusate  200 mg Per Tube Daily  . enoxaparin  30 mg Subcutaneous Q24H  . insulin aspart  0-24 Units Subcutaneous Q4H  . insulin detemir  15 Units Subcutaneous QHS  . metoprolol tartrate  12.5 mg Oral BID   Or  . metoprolol tartrate  12.5 mg Per Tube BID  . pantoprazole sodium  40 mg Per Tube Daily  . potassium chloride  10 mEq Intravenous Q1 Hr x 4  . sodium chloride  3 mL Intravenous Q12H  . Warfarin - Physician Dosing Inpatient   Does not apply q1800  . warfarin IV  2.5 mg Intravenous q1800    BMET    Component Value Date/Time   NA 133* 02/09/2013 0336   K 3.4* 02/09/2013 0336   CL 96 02/09/2013 0336   CO2 25 02/09/2013 0336   GLUCOSE 89 02/09/2013 0336   BUN 62* 02/09/2013 0336   CREATININE 2.72* 02/09/2013 0336   CALCIUM 8.3* 02/09/2013 0336   GFRNONAA 23* 02/09/2013 0336   GFRAA 26* 02/09/2013 4540  CBC    Component Value Date/Time   WBC 14.0* 02/09/2013 0336   RBC 2.76* 02/09/2013 0336   HGB 8.4* 02/09/2013 0336   HCT 24.1* 02/09/2013 0336   PLT 119* 02/09/2013 0336   MCV 87.3 02/09/2013 0336   MCH 30.4 02/09/2013 0336   MCHC 34.9 02/09/2013 0336   RDW 14.1 02/09/2013 0336   LYMPHSABS 2.2 01/31/2013 0950   MONOABS 1.8*  01/31/2013 0950   EOSABS 0.1 01/31/2013 0950   BASOSABS 0.0 01/31/2013 0950     Assessment/Plan:  1. AKI/CKD non-oliguric.  (CKD stage 3: baseline Scr ~1.3-1.49).  Marked improvement of Scr as well as diuresis with Lasix gtt.  Cont with current therapy 2. CAD s/p AMI- now s/p CABG/MVR/PFO repair- off IABP but still on milrinone and dopamine gtt.   3. SVT/wide-complex tachycardia yesterday evening. S/p cardioversion from SVT.  Stable for now 4. CHF- improving volume status.  Responding well to lasix drip. 5. VDRF- per PCCM will wait another 24 hours for more volume removal. 6. ABLA- follow and transfuse as needed 7. Hypokalemia- replete and follow 8. Hyponatremia- due to AKI/vol overload. Improving. Follow with diuresis. 9. Thrombocytopenia- improving.   Ulanda Tackett A

## 2013-02-09 NOTE — Progress Notes (Addendum)
PULMONARY  / CRITICAL CARE MEDICINE  Name: Lonnie [name] MRN: [id number] DOB: September 29, 1945    ADMISSION DATE:  01/31/2013 CONSULTATION DATE:  02/08/2013  REFERRING MD :  Dorris Fetch PRIMARY SERVICE: CVTS  CHIEF COMPLAINT:  VDRF  BRIEF PATIENT DESCRIPTION: 67 year old male with CAD presents from [facility] with STEMI, CRI, had a cath then 3 vessel CABG mitral valve placement and closure of PFO.  Cr continued to rise post op up to 3.5 and has been on a lasix drip since on multiple pressors.  PCCM called bedside due to SVT with wide complex and inability to come off the vent.  SIGNIFICANT EVENTS / STUDIES:  5/29 CABG 5/30 evolving renal failure. 6/1 SVT with wide complex requiring cardioversion.  LINES / TUBES: ET tube 5/29>>> L IJ PAC 5/29>>> R radial a-line 5/29>>> CT 5/29>>>  CULTURES: Blood 6/1>>> Urine 6/1>>> Sputum 6/1>>>  ANTIBIOTICS: Rocephin 6/2 (bronchitis)>>  SUBJECTIVE:  Awake on 1.67mcg of precedex drip. Orally intubated  VITAL SIGNS: Temp:  [97.9 F (36.6 C)-98.6 F (37 C)] 97.9 F (36.6 C) (06/02 0700) Pulse Rate:  [55-184] 90 (06/02 0700) Resp:  [0-23] 18 (06/02 0700) BP: (66-133)/(45-82) 99/65 mmHg (06/02 0700) SpO2:  [74 %-100 %] 98 % (06/02 0700) Arterial Line BP: (53-145)/(29-69) 102/53 mmHg (06/02 0700) FiO2 (%):  [40 %-100 %] 40 % (06/02 0839) Weight:  [111.7 kg (246 lb 4.1 oz)] 111.7 kg (246 lb 4.1 oz) (06/02 0500) HEMODYNAMICS: PAP: (25-43)/(17-28) 27/17 mmHg CO:  [4.7 L/min-8 L/min] 4.7 L/min CI:  [2.1 L/min/m2-3.6 L/min/m2] 2.1 L/min/m2 VENTILATOR SETTINGS: Vent Mode:  [-] PSV;CPAP FiO2 (%):  [40 %-100 %] 40 % Set Rate:  [4 bmp-18 bmp] 4 bmp Vt Set:  [640 mL-700 mL] 640 mL PEEP:  [5 cmH20-10 cmH20] 5 cmH20 Pressure Support:  [10 cmH20] 10 cmH20 Plateau Pressure:  [20 cmH20-28 cmH20] 21 cmH20 INTAKE / OUTPUT: Intake/Output     06/01 0701 - 06/02 0700 06/02 0701 - 06/03 0700   I.V. (mL/kg) 3027.5 (27.1)    Blood 350    NG/GT 270    IV  Piggyback 400    Total Intake(mL/kg) 4047.5 (36.2)    Urine (mL/kg/hr) 5775 (2.2) 775 (3.9)   Emesis/NG output 600 (0.2)    Chest Tube 10 (0)    Total Output 6385 775   Net -2337.5 -775          PHYSICAL EXAMINATION: General:  Chronically ill appearing male, anasarca. Neuro:  Arousable and follow command. HEENT:  /AT, PERRL, EOM-I and MMM. Cardiovascular:  RRR, Nl S1/S2, -M/R/G. Lungs:  Diffuse crackles but less  Abdomen:  Soft, NT, ND and +BS. Musculoskeletal:  2+ edema and -tenderness. Skin:  Intact, wound clean.  LABS:  Recent Labs Lab 02/05/13 1920  02/06/13 0127  02/06/13 1500  02/07/13 0400 02/08/13 0350 02/08/13 0913 02/08/13 1645 02/08/13 1738 02/09/13 0336 02/09/13 0500  HGB 8.9*  < >  --   < > 8.2*  < > 8.0* 7.8*  --  7.2*  --  8.4*  --   WBC 25.3*  < >  --   < > 16.6*  --  17.8* 18.6*  --  16.2*  --  14.0*  --   PLT 117*  < >  --   < > 92*  --  105* 117*  --  104*  --  119*  --   NA  --   < >  --   < > 136  < > 135 132*  --   --   --  133*  --   K  --   < >  --   < > 4.6  < > 5.0 4.5  --   --   --  3.4*  --   CL  --   --   --   < > 102  < > 100 97  --   --   --  96  --   CO2  --   --   --   < > 24  --  24 24  --   --   --  25  --   GLUCOSE  --   < >  --   < > 99  < > 162* 118*  --   --   --  89  --   BUN  --   --   --   < > 37*  < > 46* 61*  --   --   --  62*  --   CREATININE  --   --   --   < > 2.03*  < > 2.66* 3.48*  --  3.11*  --  2.72*  --   CALCIUM  --   --   --   < > 8.4  --  8.2* 8.0*  --   --   --  8.3*  --   MG  --   --   --   < > 3.3*  --   --   --   --  2.8*  --  2.7*  --   PHOS  --   --   --   --   --   --   --   --   --   --   --  5.3*  --   AST  --   --   --   --   --   --  80* 48*  --   --   --  35  --   ALT  --   --   --   --   --   --  43 39  --   --   --  35  --   ALKPHOS  --   --   --   --   --   --  57 66  --   --   --  69  --   BILITOT  --   --   --   --   --   --  0.7 0.5  --   --   --  0.6  --   PROT  --   --   --   --   --   --   5.3* 5.5*  --   --   --  5.5*  --   ALBUMIN  --   --   --   --   --   --  2.9* 2.6*  --   --   --  2.5*  --   APTT 46*  --  48*  --   --   --   --   --   --   --   --   --   --   INR 2.01*  --  1.70*  --   --   --   --  1.34  --   --   --  1.32  --   PHART 7.314*  < >  --   < >  --   < >  --   --  7.343*  --  7.372  --  7.455*  PCO2ART 48.1*  < >  --   < >  --   < >  --   --  43.7  --  39.5  --  36.0  PO2ART 54.0*  < >  --   < >  --   < >  --   --  65.0*  --  240.0*  --  152.0*  < > = values in this interval not displayed.  Recent Labs Lab 02/07/13 2046 02/07/13 2312 02/08/13 0350 02/08/13 0758 02/08/13 1150  GLUCAP 123* 71 101* 75 104*    CXR: Diffuse pulmonary edema and airspace disease, support apparatus in place. Slightly improved aeration from 02/08/13 film  ASSESSMENT / PLAN:  PULMONARY A: VDRF due to pulmonary edema from cardiac disease and renal failure. Improved  P:   - PSV cycle 6/2, no plans for extubation - Continue diureses as tolerated.  CARDIOVASCULAR A: SVT on 6/1, poor EF, cardiogenic shock.  S/p MVR, CABG 5/29 P:  - Milrinone, dopamine per TCTS -on amiodarone  -now on lasix drip  RENAL A:  A/CRF. P:   - BMET in AM. - Lasix drip as ordered.  GASTROINTESTINAL A:  No active issues. P:   - TF if not extubated in 24hrs  HEMATOLOGIC A:  Leukocytosis. Trending down 6/2 P:  - Monitor CBC.  INFECTIOUS A:  Purulent secretions bronchitis afebrile but WBC is elevated. P:   - Rocephin ordered per TCTS Day 0/X - Will order cultures.  ENDOCRINE A:  Hyperglycemia.   P:   - ISS.  NEUROLOGIC A:  Alert and interactive. P:   - Precedex drip. - Fentanyl PRN.  I have personally obtained a history, examined the patient, evaluated laboratory and imaging results, formulated the assessment and plan and placed orders.  CRITICAL CARE: The patient is critically ill with multiple organ systems failure and requires high complexity decision making for  assessment and support, frequent evaluation and titration of therapies, application of advanced monitoring technologies and extensive interpretation of multiple databases. Critical Care Time devoted to patient care services described in this note is 35 minutes.   Dorcas Carrow Beeper  [phone]  Cell  [phone]  If no response or cell goes to voicemail, call beeper [phone] Pulmonary and Critical Care Medicine [facility] [place]  02/09/2013, 8:47 AM

## 2013-02-10 ENCOUNTER — Inpatient Hospital Stay (HOSPITAL_COMMUNITY): Payer: Managed Care, Other (non HMO)

## 2013-02-10 LAB — BASIC METABOLIC PANEL
CO2: 27 mEq/L (ref 19–32)
Calcium: 8.7 mg/dL (ref 8.4–10.5)
Creatinine, Ser: 2.14 mg/dL — ABNORMAL HIGH (ref 0.50–1.35)
GFR calc non Af Amer: 30 mL/min — ABNORMAL LOW (ref >90)
Glucose, Bld: 87 mg/dL (ref 70–99)
Sodium: 136 mEq/L (ref 135–145)

## 2013-02-10 LAB — GLUCOSE, CAPILLARY
Glucose-Capillary: 110 mg/dL — ABNORMAL HIGH (ref 70–99)
Glucose-Capillary: 75 mg/dL (ref 70–99)
Glucose-Capillary: 83 mg/dL (ref 70–99)
Glucose-Capillary: 87 mg/dL (ref 70–99)

## 2013-02-10 LAB — CBC
Hemoglobin: 9.3 g/dL — ABNORMAL LOW (ref 13.0–17.0)
Platelets: 148 10*3/uL — ABNORMAL LOW (ref 150–400)
RBC: 3.01 MIL/uL — ABNORMAL LOW (ref 4.22–5.81)
WBC: 11.1 10*3/uL — ABNORMAL HIGH (ref 4.0–10.5)

## 2013-02-10 LAB — URINE CULTURE: Culture: NO GROWTH

## 2013-02-10 LAB — MAGNESIUM: Magnesium: 2.3 mg/dL (ref 1.5–2.5)

## 2013-02-10 LAB — PROTIME-INR
INR: 1.32 (ref 0.00–1.49)
Prothrombin Time: 16.1 seconds — ABNORMAL HIGH (ref 11.6–15.2)

## 2013-02-10 MED ORDER — DOCUSATE SODIUM 100 MG PO CAPS
200.0000 mg | ORAL_CAPSULE | Freq: Every day | ORAL | Status: DC
  Administered 2013-02-10 – 2013-02-17 (×7): 200 mg via ORAL
  Filled 2013-02-10 (×8): qty 2

## 2013-02-10 MED ORDER — PANTOPRAZOLE SODIUM 40 MG PO TBEC
40.0000 mg | DELAYED_RELEASE_TABLET | Freq: Every day | ORAL | Status: DC
  Administered 2013-02-10 – 2013-02-16 (×6): 40 mg via ORAL
  Filled 2013-02-10 (×6): qty 1

## 2013-02-10 MED ORDER — METOCLOPRAMIDE HCL 5 MG/ML IJ SOLN
5.0000 mg | Freq: Four times a day (QID) | INTRAMUSCULAR | Status: AC
  Administered 2013-02-10 – 2013-02-11 (×4): 5 mg via INTRAVENOUS
  Filled 2013-02-10 (×4): qty 1

## 2013-02-10 MED ORDER — DOCUSATE SODIUM 50 MG/5ML PO LIQD
200.0000 mg | Freq: Every day | ORAL | Status: DC
  Filled 2013-02-10: qty 20

## 2013-02-10 MED ORDER — BOOST / RESOURCE BREEZE PO LIQD
1.0000 | Freq: Every day | ORAL | Status: DC
  Administered 2013-02-11 – 2013-02-15 (×3): 1 via ORAL

## 2013-02-10 MED FILL — Medication: Qty: 1 | Status: AC

## 2013-02-10 NOTE — Procedures (Signed)
Extubation Procedure Note  Patient Details:   Name: Lonnie [name] DOB: Feb 18, 1946 MRN: [id number]   Airway Documentation:   Pt extubated to [place] per Dr. Delford Field. No stridor noted. Pt requiring 6L  for sats 94%, HR 71, RR 24. Pt tolerated well.  Evaluation  O2 sats: stable throughout and currently acceptable Complications: No apparent complications Patient did tolerate procedure well. Bilateral Breath Sounds: Coarse crackles Suctioning: Airway   Christie Beckers 02/10/2013, 8:16 AM

## 2013-02-10 NOTE — Progress Notes (Deleted)
Notified MD of patient having ventricular trigemy, Pt is asymptomatic, 98/75 (cuff) and 110/60 (a-line) are stable.  Will continue to assess.

## 2013-02-10 NOTE — Progress Notes (Signed)
SUBJECTIVE:  Uncomfortable.  Some SOB.    PHYSICAL EXAM Filed Vitals:   02/10/13 0700 02/10/13 0730 02/10/13 0747 02/10/13 0815  BP: 99/73 97/75 96/60  116/56  Pulse: 88 87 87 79  Temp: 98.6 F (37 C) 98.8 F (37.1 C) 98.6 F (37 C) 98.8 F (37.1 C)  TempSrc:      Resp: 17 20 24 19   Height:      Weight:      SpO2: 93% 94% 91% 94%   General:  Uncomfortable but in no distress. Lungs:  Basilar crackles Heart:  RRR, no rub Abdomen:  Positive bowel sounds, no rebound no guarding Extremities:  Diffuse edema   LABS:  Results for orders placed during the hospital encounter of 01/31/13 (from the past 24 hour(s))  GLUCOSE, CAPILLARY     Status: None   Collection Time    02/09/13 11:57 AM      Result Value Range   Glucose-Capillary 96  70 - 99 mg/dL   Comment 1 Documented in Chart     Comment 2 Notify RN    BASIC METABOLIC PANEL     Status: Abnormal   Collection Time    02/09/13  3:00 PM      Result Value Range   Sodium 137  135 - 145 mEq/L   Potassium 3.6  3.5 - 5.1 mEq/L   Chloride 97  96 - 112 mEq/L   CO2 27  19 - 32 mEq/L   Glucose, Bld 90  70 - 99 mg/dL   BUN 58 (*) 6 - 23 mg/dL   Creatinine, Ser 1.61 (*) 0.50 - 1.35 mg/dL   Calcium 8.6  8.4 - 09.6 mg/dL   GFR calc non Af Amer 26 (*) >90 mL/min   GFR calc Af Amer 30 (*) >90 mL/min  GLUCOSE, CAPILLARY     Status: Abnormal   Collection Time    02/09/13  4:00 PM      Result Value Range   Glucose-Capillary 69 (*) 70 - 99 mg/dL   Comment 1 Documented in Chart     Comment 2 Notify RN    GLUCOSE, CAPILLARY     Status: Abnormal   Collection Time    02/09/13  7:53 PM      Result Value Range   Glucose-Capillary 61 (*) 70 - 99 mg/dL   Comment 1 Documented in Chart     Comment 2 Notify RN    GLUCOSE, CAPILLARY     Status: Abnormal   Collection Time    02/09/13  8:26 PM      Result Value Range   Glucose-Capillary 120 (*) 70 - 99 mg/dL  GLUCOSE, CAPILLARY     Status: None   Collection Time    02/10/13 12:07 AM      Result Value Range   Glucose-Capillary 83  70 - 99 mg/dL   Comment 1 Documented in Chart     Comment 2 Notify RN    BASIC METABOLIC PANEL     Status: Abnormal   Collection Time    02/10/13  1:26 AM      Result Value Range   Sodium 136  135 - 145 mEq/L   Potassium 3.9  3.5 - 5.1 mEq/L   Chloride 97  96 - 112 mEq/L   CO2 27  19 - 32 mEq/L   Glucose, Bld 87  70 - 99 mg/dL   BUN 54 (*) 6 - 23 mg/dL   Creatinine, Ser 0.45 (*) 0.50 -  1.35 mg/dL   Calcium 8.7  8.4 - 16.1 mg/dL   GFR calc non Af Amer 30 (*) >90 mL/min   GFR calc Af Amer 35 (*) >90 mL/min  MAGNESIUM     Status: None   Collection Time    02/10/13  1:26 AM      Result Value Range   Magnesium 2.3  1.5 - 2.5 mg/dL  GLUCOSE, CAPILLARY     Status: None   Collection Time    02/10/13  3:02 AM      Result Value Range   Glucose-Capillary 75  70 - 99 mg/dL   Comment 1 Documented in Chart     Comment 2 Call MD NNP PA CNM    CBC     Status: Abnormal   Collection Time    02/10/13  4:00 AM      Result Value Range   WBC 11.1 (*) 4.0 - 10.5 K/uL   RBC 3.01 (*) 4.22 - 5.81 MIL/uL   Hemoglobin 9.3 (*) 13.0 - 17.0 g/dL   HCT 09.6 (*) 04.5 - 40.9 %   MCV 89.4  78.0 - 100.0 fL   MCH 30.9  26.0 - 34.0 pg   MCHC 34.6  30.0 - 36.0 g/dL   RDW 81.1  91.4 - 78.2 %   Platelets 148 (*) 150 - 400 K/uL  PROTIME-INR     Status: Abnormal   Collection Time    02/10/13  4:00 AM      Result Value Range   Prothrombin Time 16.1 (*) 11.6 - 15.2 seconds   INR 1.32  0.00 - 1.49    Intake/Output Summary (Last 24 hours) at 02/10/13 0934 Last data filed at 02/10/13 0600  Gross per 24 hour  Intake 2826.7 ml  Output   6200 ml  Net -3373.3 ml    ASSESSMENT AND PLAN:  VENTRICULAR TACHYCARDIA:  Still with runs of NSVT.  Continue IV amiodarone.   CARDIOMYOPATHY:  Ischemic.  Good UO yesterday.  Holding Lasix for now.   CABG/MVR:  Extubated today.  Continues low dose dopamine.  Wean as tolerated.  CKD:  Creat is slightly better today.     Fayrene Fearing [facility] 02/10/2013 9:34 AM

## 2013-02-10 NOTE — Progress Notes (Signed)
PT Cancellation Note  Patient Details Name: VINCIENT VANAMAN MRN: [id number] DOB: November 14, 1945   Cancelled Treatment:    Reason Eval/Treat Not Completed: Other (comment) (nursing pulling lines this am; return in pm as able. )   INGOLD,Woodward Klem 02/10/2013, 9:57 AM [facility] [phone] (pager)

## 2013-02-10 NOTE — Progress Notes (Signed)
Patient's CI has dropped to 1.7, spoke with CCM Md, informed CCM MD that per Dr. Cornelius Moras turn lasix drip off.  Drip turned off, patient still experiencing frequent runs of trigemy and pvcs, both MDs aware.  Will to continue to assess.

## 2013-02-10 NOTE — Progress Notes (Signed)
PT Cancellation Note  Patient Details Name: Lonnie [name] MRN: [id number] DOB: 1946/03/15   Cancelled Treatment:    Reason Eval/Treat Not Completed: Other (comment) (pt refused, agreed to PT tomorrow).   [name] 02/10/2013, 1:17 PM [phone]

## 2013-02-10 NOTE — Progress Notes (Signed)
Patient CI dropped to 1.8, spoke with Dr. Cornelius Moras, said to leave dopamine at current rate of and if CI continues to drop, stop the lasix drip.  Will continue to assess.

## 2013-02-10 NOTE — Progress Notes (Signed)
5 Days Post-Op Procedure(s) (LRB): INTRAOPERATIVE TRANSESOPHAGEAL ECHOCARDIOGRAM (N/A) ENDOVEIN HARVEST OF GREATER SAPHENOUS VEIN (Right) PATENT FORAMEN OVALE CLOSURE (N/A) MITRAL VALVE REPLACEMENT (MVR)/CORONARY ARTERY BYPASS GRAFTING (CABG) (N/A) Subjective: Intubated, alert C/o pain from ETT  Objective: Vital signs in last 24 hours: Temp:  [97.5 F (36.4 C)-99 F (37.2 C)] 98.6 F (37 C) (06/03 0747) Pulse Rate:  [41-89] 87 (06/03 0747) Cardiac Rhythm:  [-] Ventricular paced (06/02 2000) Resp:  [11-28] 24 (06/03 0747) BP: (83-111)/(54-78) 96/60 mmHg (06/03 0747) SpO2:  [91 %-99 %] 91 % (06/03 0747) Arterial Line BP: (94-137)/(50-72) 104/58 mmHg (06/03 0630) FiO2 (%):  [40 %] 40 % (06/03 0747) Weight:  [234 lb 12.6 oz (106.5 kg)] 234 lb 12.6 oz (106.5 kg) (06/03 0630)  Hemodynamic parameters for last 24 hours: PAP: (24-36)/(14-26) 29/21 mmHg CO:  [3.7 L/min-6.5 L/min] 4.4 L/min CI:  [1.7 L/min/m2-2.9 L/min/m2] 2 L/min/m2  Intake/Output from previous day: 06/02 0701 - 06/03 0700 In: 2974.9 [I.V.:2824.9; IV Piggyback:150] Out: 7175 [Urine:7175] Intake/Output this shift:    General appearance: alert and cooperative Neurologic: intact Heart: regular rate and rhythm Lungs: clear to auscultation bilaterally Abdomen: normal findings: soft, non-tender Wound: clean and dry  Lab Results:  Recent Labs  02/09/13 0336 02/10/13 0400  WBC 14.0* 11.1*  HGB 8.4* 9.3*  HCT 24.1* 26.9*  PLT 119* 148*   BMET:  Recent Labs  02/09/13 1500 02/10/13 0126  NA 137 136  K 3.6 3.9  CL 97 97  CO2 27 27  GLUCOSE 90 87  BUN 58* 54*  CREATININE 2.44* 2.14*  CALCIUM 8.6 8.7    PT/INR:  Recent Labs  02/10/13 0400  LABPROT 16.1*  INR 1.32   ABG    Component Value Date/Time   PHART 7.455* 02/09/2013 0500   HCO3 25.0* 02/09/2013 0500   TCO2 26.1 02/09/2013 0500   ACIDBASEDEF 2.0 02/08/2013 1738   O2SAT 99.2 02/09/2013 0500   CBG (last 3)   Recent Labs  02/09/13 2026  02/10/13 0007 02/10/13 0302  GLUCAP 120* 83 75    Assessment/Plan: S/P Procedure(s) (LRB): INTRAOPERATIVE TRANSESOPHAGEAL ECHOCARDIOGRAM (N/A) ENDOVEIN HARVEST OF GREATER SAPHENOUS VEIN (Right) PATENT FORAMEN OVALE CLOSURE (N/A) MITRAL VALVE REPLACEMENT (MVR)/CORONARY ARTERY BYPASS GRAFTING (CABG) (N/A) - CV- having PVCs no further VT- on amiodarone. In SR with long PR, change pacer to VVI at 60  Coumadin for MVR(6-8 weeks)  Dc swan  RESP- extubate today, pulmonary edema  Started on rocephin yesterday fro presumed bronchitis  RENAL- ARF- resolving, lasix gtt dc'ed due to high volume UO  Will allow to reequilibrate before resuming diuresis  ENDO- hypoglycemia- treated  DVT prophylaxis- SCD, lovenox until INR increases  Thrombocytopenia- better  Advance diet once extubated    LOS: 10 days    Assia Meanor C 02/10/2013

## 2013-02-10 NOTE — Progress Notes (Signed)
Spoke with Dr. Cornelius Moras about patient's CI, he said that if patient becomes hypotensive, give one albumin, other than that no orders given.  Patient BP is 117/57 (aline) and 97/54 (cuff), will continue to watch CI and BP and UO

## 2013-02-10 NOTE — Progress Notes (Signed)
Extubated this AM  Tired at presented- was up in chair x 4 hours  BP 115/72  Pulse 61  Temp(Src) 97.7 F (36.5 C) (Oral)  Resp 14  Ht 6\' 1"  (1.854 m)  Wt 234 lb 12.6 oz (106.5 kg)  BMI 30.98 kg/m2  SpO2 96%   Intake/Output Summary (Last 24 hours) at 02/10/13 1810 Last data filed at 02/10/13 1800  Gross per 24 hour  Intake   1425 ml  Output   4775 ml  Net  -3350 ml    Still good UO  C/o nausea after eating ice cream at lunch- will add reglan x 4 doses

## 2013-02-10 NOTE — Progress Notes (Signed)
PULMONARY  / CRITICAL CARE MEDICINE  Name: Lonnie [name] MRN: [id number] DOB: 05-13-1946    ADMISSION DATE:  01/31/2013 CONSULTATION DATE:  02/08/2013  REFERRING MD :  Dorris Fetch PRIMARY SERVICE: CVTS  CHIEF COMPLAINT:  VDRF  BRIEF PATIENT DESCRIPTION: 67 year old male with CAD presents from [facility] with STEMI, CRI, had a cath then 3 vessel CABG mitral valve placement and closure of PFO.  Cr continued to rise post op up to 3.5 and has been on a lasix drip since on multiple pressors.  PCCM called bedside due to SVT with wide complex and inability to come off the vent.  SIGNIFICANT EVENTS / STUDIES:  5/29 CABG 5/30 evolving renal failure. 6/1 SVT with wide complex requiring cardioversion. 6/3 Extubation, borderline SpO2  LINES / TUBES: ET tube 5/29>>>6/3 L IJ PAC 5/29>>> R radial a-line 5/29>>> CT 5/29>>>6/2  CULTURES: Blood 6/1>>>Neg Urine 6/1>>>neg Sputum 6/1>>>neg  ANTIBIOTICS: Rocephin 6/2 (bronchitis)>>  SUBJECTIVE:  Weaned well all day 6/2.  Extubation done early 8AM>>>border line sats Lasix drip stopped 1AM 6/3 VITAL SIGNS: Temp:  [97.5 F (36.4 C)-99 F (37.2 C)] 98.6 F (37 C) (06/03 0747) Pulse Rate:  [41-89] 87 (06/03 0747) Resp:  [11-28] 24 (06/03 0747) BP: (83-111)/(54-78) 96/60 mmHg (06/03 0747) SpO2:  [91 %-99 %] 91 % (06/03 0747) Arterial Line BP: (94-137)/(50-72) 104/58 mmHg (06/03 0630) FiO2 (%):  [40 %] 40 % (06/03 0747) Weight:  [106.5 kg (234 lb 12.6 oz)] 106.5 kg (234 lb 12.6 oz) (06/03 0630) HEMODYNAMICS: PAP: (24-36)/(14-26) 29/21 mmHg CO:  [3.7 L/min-6.5 L/min] 4.4 L/min CI:  [1.7 L/min/m2-2.9 L/min/m2] 2 L/min/m2 On DA, on amiodarone drip  VENTILATOR SETTINGS: Vent Mode:  [-] PSV FiO2 (%):  [40 %] 40 % Set Rate:  [18 bmp] 18 bmp Vt Set:  [640 mL] 640 mL PEEP:  [5 cmH20] 5 cmH20 Pressure Support:  [5 cmH20-10 cmH20] 5 cmH20 Plateau Pressure:  [22 cmH20-23 cmH20] 23 cmH20 On PSV 5/5>>good parameters  INTAKE /  OUTPUT: Intake/Output     06/02 0701 - 06/03 0700 06/03 0701 - 06/04 0700   I.V. (mL/kg) 2824.9 (26.5)    Blood     NG/GT     IV Piggyback 150    Total Intake(mL/kg) 2974.9 (27.9)    Urine (mL/kg/hr) 7175 (2.8)    Emesis/NG output     Chest Tube     Total Output 7175     Net -4200.1            PHYSICAL EXAMINATION: General:  Chronically ill appearing male, less  anasarca. Neuro:  Awake and alert HEENT:  [place]/AT, PERRL, EOM-I and MMM., ETT in place then removed on AM rounds Cardiovascular:  RRR, Nl S1/S2, -M/R/G. Lungs: clearer, less secretions Abdomen:  Soft, NT, ND and +BS. Musculoskeletal:  1 + edema and -tenderness. Skin:  Intact, wound clean.  LABS:  Recent Labs Lab 02/05/13 1920  02/06/13 0127  02/07/13 0400 02/08/13 0350 02/08/13 0913 02/08/13 1645 02/08/13 1738 02/08/13 1743 02/09/13 0336 02/09/13 0500 02/09/13 1500 02/10/13 0126 02/10/13 0400  HGB 8.9*  < >  --   < > 8.0* 7.8*  --  7.2*  --  7.1* 8.4*  --   --   --  9.3*  WBC 25.3*  < >  --   < > 17.8* 18.6*  --  16.2*  --   --  14.0*  --   --   --  11.1*  PLT 117*  < >  --   < >  105* 117*  --  104*  --   --  119*  --   --   --  148*  NA  --   < >  --   < > 135 132*  --   --   --  136 133*  --  137 136  --   K  --   < >  --   < > 5.0 4.5  --   --   --  3.5 3.4*  --  3.6 3.9  --   CL  --   --   --   < > 100 97  --   --   --  101 96  --  97 97  --   CO2  --   --   --   < > 24 24  --   --   --   --  25  --  27 27  --   GLUCOSE  --   < >  --   < > 162* 118*  --   --   --  113* 89  --  90 87  --   BUN  --   --   --   < > 46* 61*  --   --   --  53* 62*  --  58* 54*  --   CREATININE  --   --   --   < > 2.66* 3.48*  --  3.11*  --  2.60* 2.72*  --  2.44* 2.14*  --   CALCIUM  --   --   --   < > 8.2* 8.0*  --   --   --   --  8.3*  --  8.6 8.7  --   MG  --   --   --   < >  --   --   --  2.8*  --   --  2.7*  --   --  2.3  --   PHOS  --   --   --   --   --   --   --   --   --   --  5.3*  --   --   --   --   AST  --    --   --   --  80* 48*  --   --   --   --  35  --   --   --   --   ALT  --   --   --   --  43 39  --   --   --   --  35  --   --   --   --   ALKPHOS  --   --   --   --  57 66  --   --   --   --  69  --   --   --   --   BILITOT  --   --   --   --  0.7 0.5  --   --   --   --  0.6  --   --   --   --   PROT  --   --   --   --  5.3* 5.5*  --   --   --   --  5.5*  --   --   --   --   ALBUMIN  --   --   --   --  2.9* 2.6*  --   --   --   --  2.5*  --   --   --   --   APTT 46*  --  48*  --   --   --   --   --   --   --   --   --   --   --   --   INR 2.01*  --  1.70*  --   --  1.34  --   --   --   --  1.32  --   --   --  1.32  PROBNP  --   --   --   --   --   --   --   --   --   --  16248.0*  --   --   --   --   PHART 7.314*  < >  --   < >  --   --  7.343*  --  7.372  --   --  7.455*  --   --   --   PCO2ART 48.1*  < >  --   < >  --   --  43.7  --  39.5  --   --  36.0  --   --   --   PO2ART 54.0*  < >  --   < >  --   --  65.0*  --  240.0*  --   --  152.0*  --   --   --   < > = values in this interval not displayed.  Recent Labs Lab 02/09/13 1600 02/09/13 1953 02/09/13 2026 02/10/13 0007 02/10/13 0302  GLUCAP 69* 61* 120* 83 75    CXR: improved pulm edema from 6/2 film but still present  ASSESSMENT / PLAN:  PULMONARY A: VDRF due to pulmonary edema from cardiac disease and renal failure. Improved  P:   - extubation trial 6/3 AM -hold diuresis this am, perhaps give another lasix dose this PM>>off lasix drip CARDIOVASCULAR A: SVT on 6/1, poor EF, cardiogenic shock.  S/p MVR, CABG 5/29 EF 30-35% on echo 6/1  P:  - dopamine per TCTS -on amiodarone  -lasix held as of 1AM 6/3  RENAL A: acute on chronic renal failure, Cr better P:   - BMET in AM. - renal following  GASTROINTESTINAL A:  No active issues. P:   - advance diet if does well extubated   HEMATOLOGIC A:  Leukocytosis improved with rocephin   P:  - Monitor CBC.  INFECTIOUS A: Less  Purulent secretions bronchitis  afebrile , WBC better, Cults pending P:   - cont Rocephin ordered per TCTS Day 1/X - f/u  cultures.  ENDOCRINE A:  Hyperglycemia.   P:   - SSI  NEUROLOGIC A:  Alert and interactive. P:   - Precedex drip.>>wean to off after extubation - Fentanyl PRN.  I have personally obtained a history, examined the patient, evaluated laboratory and imaging results, formulated the assessment and plan and placed orders.  CRITICAL CARE: The patient is critically ill with multiple organ systems failure and requires high complexity decision making for assessment and support, frequent evaluation and titration of therapies, application of advanced monitoring technologies and extensive interpretation of multiple databases. Critical Care Time devoted to patient care services described in this note is 35 minutes.   Dorcas Carrow Beeper  [phone]  Cell  [phone]  If no response or cell goes  to voicemail, call beeper [phone] Pulmonary and Critical Care Medicine [facility]  02/10/2013, 8:08 AM

## 2013-02-10 NOTE — Progress Notes (Signed)
BMET and MG results   Results for Lonnie [name], Lonnie [name] (MRN [id number]) as of 02/10/2013 06:40  Ref. Range 02/10/2013 01:26  Sodium Latest Range: 135-145 mEq/L 136  Potassium Latest Range: 3.5-5.1 mEq/L 3.9  Chloride Latest Range: 96-112 mEq/L 97  CO2 Latest Range: 19-32 mEq/L 27  BUN Latest Range: 6-23 mg/dL 54 (H)  Creatinine Latest Range: 0.50-1.35 mg/dL 5.78 (H)  Calcium Latest Range: 8.4-10.5 mg/dL 8.7  GFR calc non Af Amer Latest Range: >90 mL/min 30 (L)  GFR calc Af Amer Latest Range: >90 mL/min 35 (L)  Glucose Latest Range: 70-99 mg/dL 87  Magnesium Latest Range: 1.5-2.5 mg/dL 2.3

## 2013-02-10 NOTE — Progress Notes (Signed)
NUTRITION FOLLOW UP  Intervention:    Resource Breeze daily (250 kcals, 9 gm protein per 8 fl oz carton) RD to follow for nutrition care plan  New Nutrition Dx:   Increased nutrient needs related to post-op healing as evidenced by estimated nutrition needs  New Goal:   Oral intake with meals & supplements to meet >/= 90% of estimated nutrition needs  Monitor:   PO & supplemental intake, weight, labs, I/O's  Assessment:   Patient with no prior cardiac history presented to [facility] with a 4-day history of dyspnea on exertion and orthopnea; underwent cardiac cath which revealed severe 2 vessel CAD.   Patient s/p procedures 5/29:  ENDOVEIN HARVEST OF GREATER SAPHENOUS VEIN  PATENT FORAMEN OVALE CLOSURE  MITRAL VALVE REPLACEMENT  CORONARY ARTERY BYPASS GRAFTING   Patient extubated this AM.  Advanced to Clear Liquids 0813.  Would benefit from addition of nutrition supplement ---> RD to order.  Height: Ht Readings from Last 1 Encounters:  01/31/13 6\' 1"  (1.854 m)    Weight Status:   Wt Readings from Last 1 Encounters:  02/10/13 234 lb 12.6 oz (106.5 kg)    Re-estimated needs:  Kcal: 2200-2300 Protein: 120-130 gm Fluid: per MD  Skin: sternum & leg surgical incisions   Diet Order: Clear Liquid   Intake/Output Summary (Last 24 hours) at 02/10/13 1442 Last data filed at 02/10/13 1300  Gross per 24 hour  Intake 1438.8 ml  Output   5825 ml  Net -4386.2 ml    Labs:   Recent Labs Lab 02/08/13 1645 02/08/13 1743 02/09/13 0336 02/09/13 1500 02/10/13 0126  NA  --  136 133* 137 136  K  --  3.5 3.4* 3.6 3.9  CL  --  101 96 97 97  CO2  --   --  25 27 27   BUN  --  53* 62* 58* 54*  CREATININE 3.11* 2.60* 2.72* 2.44* 2.14*  CALCIUM  --   --  8.3* 8.6 8.7  MG 2.8*  --  2.7*  --  2.3  PHOS  --   --  5.3*  --   --   GLUCOSE  --  113* 89 90 87    CBG (last 3)   Recent Labs  02/10/13 0302 02/10/13 0724 02/10/13 1206  GLUCAP 75 87 109*    Scheduled  Meds: . acetaminophen  1,000 mg Oral Q6H   Or  . acetaminophen (TYLENOL) oral liquid 160 mg/5 mL  975 mg Per Tube Q6H  . antiseptic oral rinse  15 mL Mouth Rinse QID  . aspirin EC  81 mg Oral Daily   Or  . aspirin  81 mg Per Tube Daily  . atorvastatin  80 mg Oral q1800  . bisacodyl  10 mg Oral Daily   Or  . bisacodyl  10 mg Rectal Daily  . cefTRIAXone (ROCEPHIN)  IV  2 g Intravenous Q24H  . chlorhexidine  15 mL Mouth/Throat BID  . docusate sodium  200 mg Oral Daily  . enoxaparin  30 mg Subcutaneous Q24H  . insulin aspart  0-24 Units Subcutaneous Q4H  . metoprolol tartrate  12.5 mg Oral BID   Or  . metoprolol tartrate  12.5 mg Per Tube BID  . pantoprazole  40 mg Oral Q1200  . sodium chloride  3 mL Intravenous Q12H  . Warfarin - Physician Dosing Inpatient   Does not apply q1800  . warfarin IV  2.5 mg Intravenous q1800    Continuous Infusions: .  sodium chloride 20 mL/hr at 02/05/13 2015  . sodium chloride 20 mL/hr at 02/09/13 0617  . sodium chloride 250 mL (02/08/13 2357)  . amiodarone (NEXTERONE PREMIX) 360 mg/200 mL dextrose 30 mg/hr (02/10/13 1055)  . dexmedetomidine 0.7 mcg/kg/hr (02/10/13 0600)  . DOPamine 3 mcg/kg/min (02/09/13 2335)  . lactated ringers Stopped (02/07/13 0000)    Maureen Chatters, RD, LDN Pager #: [phone] After-Hours Pager #: [phone]

## 2013-02-10 NOTE — Progress Notes (Signed)
Patient is still having ventricular trigemy and frequent PVCs, spoke to CCM MD to obtain labs, orders received for BMP and MAG, will get labs, and will continue to assess patient

## 2013-02-10 NOTE — Plan of Care (Signed)
Problem: Phase I Progression Outcomes Goal: Point person for discharge identified Outcome: Completed/Met Date Met:  02/10/13 Home with wife. Goal: Initial discharge plan identified Outcome: Completed/Met Date Met:  02/10/13 Home with wife.

## 2013-02-10 NOTE — Progress Notes (Signed)
Patient ID: Lonnie [name], male   DOB: Feb 12, 1946, 67 y.o.   MRN: [id number] S:extubated, no new complaints O:BP 116/56  Pulse 79  Temp(Src) 98.8 F (37.1 C) (Core (Comment))  Resp 19  Ht 6\' 1"  (1.854 m)  Wt 106.5 kg (234 lb 12.6 oz)  BMI 30.98 kg/m2  SpO2 94%  Intake/Output Summary (Last 24 hours) at 02/10/13 1006 Last data filed at 02/10/13 0600  Gross per 24 hour  Intake 2735.2 ml  Output   6000 ml  Net -3264.8 ml   Intake/Output: I/O last 3 completed shifts: In: 4694.1 [I.V.:4064.1; Blood:350; NG/GT:30; IV Piggyback:250] Out: 40981 [Urine:10775; Emesis/NG output:200; Chest Tube:10]  Intake/Output this shift:    Weight change: -5.2 kg (-11 lb 7.4 oz) Gen:WD WN WM in NAD CVS:no rub Resp:scattered rhonchi and occ exp wheezes XBJ:YNWGNF Ext:+edema    Recent Labs Lab 02/06/13 0531 02/06/13 1500 02/06/13 1505 02/07/13 0400 02/08/13 0350 02/08/13 1645 02/08/13 1743 02/09/13 0336 02/09/13 1500 02/10/13 0126  NA 136 136 137 135 132*  --  136 133* 137 136  K 4.7 4.6 4.5 5.0 4.5  --  3.5 3.4* 3.6 3.9  CL 101 102 104 100 97  --  101 96 97 97  CO2 26 24  --  24 24  --   --  25 27 27   GLUCOSE 124* 99 94 162* 118*  --  113* 89 90 87  BUN 34* 37* 34* 46* 61*  --  53* 62* 58* 54*  CREATININE 1.68* 2.03* 2.00* 2.66* 3.48* 3.11* 2.60* 2.72* 2.44* 2.14*  ALBUMIN  --   --   --  2.9* 2.6*  --   --  2.5*  --   --   CALCIUM 8.9 8.4  --  8.2* 8.0*  --   --  8.3* 8.6 8.7  PHOS  --   --   --   --   --   --   --  5.3*  --   --   AST  --   --   --  80* 48*  --   --  35  --   --   ALT  --   --   --  43 39  --   --  35  --   --    Liver Function Tests:  Recent Labs Lab 02/07/13 0400 02/08/13 0350 02/09/13 0336  AST 80* 48* 35  ALT 43 39 35  ALKPHOS 57 66 69  BILITOT 0.7 0.5 0.6  PROT 5.3* 5.5* 5.5*  ALBUMIN 2.9* 2.6* 2.5*   No results found for this basename: LIPASE, AMYLASE,  in the last 168 hours No results found for this basename: AMMONIA,  in the last 168  hours CBC:  Recent Labs Lab 02/07/13 0400 02/08/13 0350 02/08/13 1645 02/08/13 1743 02/09/13 0336 02/10/13 0400  WBC 17.8* 18.6* 16.2*  --  14.0* 11.1*  HGB 8.0* 7.8* 7.2* 7.1* 8.4* 9.3*  HCT 23.0* 23.2* 21.3* 21.0* 24.1* 26.9*  MCV 88.8 90.3 88.8  --  87.3 89.4  PLT 105* 117* 104*  --  119* 148*   Cardiac Enzymes: No results found for this basename: CKTOTAL, CKMB, CKMBINDEX, TROPONINI,  in the last 168 hours CBG:  Recent Labs Lab 02/09/13 1600 02/09/13 1953 02/09/13 2026 02/10/13 0007 02/10/13 0302  GLUCAP 69* 61* 120* 83 75    Iron Studies:  Recent Labs  02/08/13 1145  IRON 12*  TIBC 181*  FERRITIN 493*   Studies/Results: Dg Chest  Port 1 View  02/10/2013   *RADIOLOGY REPORT*  Clinical Data: Post mitral valve replacement and repair of patent foramen ovale, follow-up  PORTABLE CHEST - 1 VIEW  Comparison: Portable chest x-ray of 02/09/2013  Findings: The endotracheal tube is unchanged in position as is the Swan-Ganz catheter.  The left chest tube has been removed and no pneumothorax is seen.  There is still persistent congestive heart failure present with cardiomegaly and pulmonary vascular congestion and probable small effusions.  IMPRESSION: Left chest tube removed.  No pneumothorax.  Persistent CHF.   Original Report Authenticated By: Dwyane Dee, M.D.   Dg Chest Port 1 View  02/09/2013   *RADIOLOGY REPORT*  Clinical Data: Endotracheal tube placement, history of cardiac surgery for mitral valve replacement and repair of a patent foramen ovale as well as CABG  PORTABLE CHEST - 1 VIEW  Comparison: Portable chest x-ray of 02/08/2013  Findings: The tip of the endotracheal tube is approximately 4.5 cm above the carina.  Aeration has improved slightly.  There is however little change in diffuse airspace disease left greater than right and cardiomegaly is stable.  The Swan-Ganz catheter and left chest tube remain with no pneumothorax noted.  IMPRESSION:  1.  Little change to  minimal improved aeration. 2.  Little change in diffuse airspace disease. 3.  Tip of endotracheal tube 4.5 cm above the carina.   Original Report Authenticated By: Dwyane Dee, M.D.   . acetaminophen  1,000 mg Oral Q6H   Or  . acetaminophen (TYLENOL) oral liquid 160 mg/5 mL  975 mg Per Tube Q6H  . antiseptic oral rinse  15 mL Mouth Rinse QID  . aspirin EC  81 mg Oral Daily   Or  . aspirin  81 mg Per Tube Daily  . atorvastatin  80 mg Oral q1800  . bisacodyl  10 mg Oral Daily   Or  . bisacodyl  10 mg Rectal Daily  . cefTRIAXone (ROCEPHIN)  IV  2 g Intravenous Q24H  . chlorhexidine  15 mL Mouth/Throat BID  . docusate sodium  200 mg Oral Daily  . enoxaparin  30 mg Subcutaneous Q24H  . insulin aspart  0-24 Units Subcutaneous Q4H  . metoprolol tartrate  12.5 mg Oral BID   Or  . metoprolol tartrate  12.5 mg Per Tube BID  . pantoprazole  40 mg Oral Q1200  . sodium chloride  3 mL Intravenous Q12H  . Warfarin - Physician Dosing Inpatient   Does not apply q1800  . warfarin IV  2.5 mg Intravenous q1800    BMET    Component Value Date/Time   NA 136 02/10/2013 0126   K 3.9 02/10/2013 0126   CL 97 02/10/2013 0126   CO2 27 02/10/2013 0126   GLUCOSE 87 02/10/2013 0126   BUN 54* 02/10/2013 0126   CREATININE 2.14* 02/10/2013 0126   CALCIUM 8.7 02/10/2013 0126   GFRNONAA 30* 02/10/2013 0126   GFRAA 35* 02/10/2013 0126   CBC    Component Value Date/Time   WBC 11.1* 02/10/2013 0400   RBC 3.01* 02/10/2013 0400   HGB 9.3* 02/10/2013 0400   HCT 26.9* 02/10/2013 0400   PLT 148* 02/10/2013 0400   MCV 89.4 02/10/2013 0400   MCH 30.9 02/10/2013 0400   MCHC 34.6 02/10/2013 0400   RDW 14.4 02/10/2013 0400   LYMPHSABS 2.2 01/31/2013 0950   MONOABS 1.8* 01/31/2013 0950   EOSABS 0.1 01/31/2013 0950   BASOSABS 0.0 01/31/2013 0950    Assessment/Plan:  1.  AKI/CKD non-oliguric. (CKD stage 3: baseline Scr ~1.3-1.49). Marked improvement of Scr as well as diuresis with Lasix gtt. Will stop lasix gtt and dose intermittently if  needed. 2. CAD s/p AMI- now s/p CABG/MVR/PFO repair- off IABP, milrinone and dopamine gtt.  3. SVT/wide-complex tachycardia yesterday evening. S/p cardioversion from SVT. Stable for now 4. CHF- improving volume status. Good UOP off of lasix gtt. Cont to follow 5. VDRF- s/p extubation and doing well 6. ABLA- follow and transfuse as needed 7. Hypokalemia- replete and follow. Will be better to control now that he is off lasix gtt 8. Hyponatremia- due to AKI/vol overload. Improving.  9. Thrombocytopenia- improving.  10.  Keenen Roessner A

## 2013-02-11 ENCOUNTER — Inpatient Hospital Stay (HOSPITAL_COMMUNITY): Payer: Managed Care, Other (non HMO)

## 2013-02-11 LAB — BASIC METABOLIC PANEL
Calcium: 8.8 mg/dL (ref 8.4–10.5)
Chloride: 101 mEq/L (ref 96–112)
Creatinine, Ser: 1.68 mg/dL — ABNORMAL HIGH (ref 0.50–1.35)
GFR calc Af Amer: 47 mL/min — ABNORMAL LOW (ref >90)
Sodium: 140 mEq/L (ref 135–145)

## 2013-02-11 LAB — CULTURE, RESPIRATORY W GRAM STAIN

## 2013-02-11 LAB — GLUCOSE, CAPILLARY: Glucose-Capillary: 118 mg/dL — ABNORMAL HIGH (ref 70–99)

## 2013-02-11 LAB — CBC
HCT: 29.1 % — ABNORMAL LOW (ref 39.0–52.0)
Platelets: 187 10*3/uL (ref 150–400)
RBC: 3.15 MIL/uL — ABNORMAL LOW (ref 4.22–5.81)
RDW: 14.8 % (ref 11.5–15.5)
WBC: 12.7 10*3/uL — ABNORMAL HIGH (ref 4.0–10.5)

## 2013-02-11 LAB — PROTIME-INR
INR: 1.41 (ref 0.00–1.49)
Prothrombin Time: 16.9 seconds — ABNORMAL HIGH (ref 11.6–15.2)

## 2013-02-11 MED ORDER — POTASSIUM CHLORIDE 10 MEQ/50ML IV SOLN
10.0000 meq | INTRAVENOUS | Status: AC
  Administered 2013-02-11 (×4): 10 meq via INTRAVENOUS
  Filled 2013-02-11: qty 200

## 2013-02-11 MED ORDER — ACETAMINOPHEN 325 MG PO TABS
650.0000 mg | ORAL_TABLET | Freq: Four times a day (QID) | ORAL | Status: DC | PRN
  Administered 2013-02-11 – 2013-02-14 (×2): 650 mg via ORAL
  Filled 2013-02-11: qty 2

## 2013-02-11 MED ORDER — DIPHENHYDRAMINE HCL 25 MG PO CAPS
25.0000 mg | ORAL_CAPSULE | Freq: Once | ORAL | Status: AC
  Administered 2013-02-11: 25 mg via ORAL
  Filled 2013-02-11: qty 1

## 2013-02-11 MED ORDER — INSULIN ASPART 100 UNIT/ML ~~LOC~~ SOLN
0.0000 [IU] | Freq: Three times a day (TID) | SUBCUTANEOUS | Status: DC
  Administered 2013-02-11 – 2013-02-12 (×3): 2 [IU] via SUBCUTANEOUS

## 2013-02-11 MED ORDER — ZOLPIDEM TARTRATE 5 MG PO TABS
5.0000 mg | ORAL_TABLET | Freq: Every evening | ORAL | Status: DC | PRN
  Administered 2013-02-11: 5 mg via ORAL
  Filled 2013-02-11: qty 1

## 2013-02-11 MED ORDER — FUROSEMIDE 10 MG/ML IJ SOLN
40.0000 mg | Freq: Once | INTRAMUSCULAR | Status: AC
  Administered 2013-02-11: 40 mg via INTRAVENOUS
  Filled 2013-02-11: qty 4

## 2013-02-11 NOTE — Progress Notes (Signed)
Patient ID: Lonnie [name], male   DOB: 30-Jul-1946, 67 y.o.   MRN: [id number] S:pt without complaints O:BP 115/69  Pulse 78  Temp(Src) 98 F (36.7 C) (Oral)  Resp 13  Ht 6\' 1"  (1.854 m)  Wt 105.8 kg (233 lb 4 oz)  BMI 30.78 kg/m2  SpO2 93%  Intake/Output Summary (Last 24 hours) at 02/11/13 0941 Last data filed at 02/11/13 0700  Gross per 24 hour  Intake  948.4 ml  Output   2595 ml  Net -1646.6 ml   Intake/Output: I/O last 3 completed shifts: In: 1777.7 [P.O.:420; I.V.:1207.7; IV Piggyback:150] Out: 6095 [Urine:6095]  Intake/Output this shift:    Weight change: -0.7 kg (-1 lb 8.7 oz) Gen:WD WN WM in minimal discomfort CVS:IRR Resp:bilateral crackles WUJ:WJXBJ, ND Ext:+1 edema   Recent Labs Lab 02/06/13 1500  02/07/13 0400 02/08/13 0350 02/08/13 1645 02/08/13 1743 02/09/13 0336 02/09/13 1500 02/10/13 0126 02/11/13 0451  NA 136  < > 135 132*  --  136 133* 137 136 140  K 4.6  < > 5.0 4.5  --  3.5 3.4* 3.6 3.9 3.8  CL 102  < > 100 97  --  101 96 97 97 101  CO2 24  --  24 24  --   --  25 27 27 29   GLUCOSE 99  < > 162* 118*  --  113* 89 90 87 135*  BUN 37*  < > 46* 61*  --  53* 62* 58* 54* 48*  CREATININE 2.03*  < > 2.66* 3.48* 3.11* 2.60* 2.72* 2.44* 2.14* 1.68*  ALBUMIN  --   --  2.9* 2.6*  --   --  2.5*  --   --   --   CALCIUM 8.4  --  8.2* 8.0*  --   --  8.3* 8.6 8.7 8.8  PHOS  --   --   --   --   --   --  5.3*  --   --   --   AST  --   --  80* 48*  --   --  35  --   --   --   ALT  --   --  43 39  --   --  35  --   --   --   < > = values in this interval not displayed. Liver Function Tests:  Recent Labs Lab 02/07/13 0400 02/08/13 0350 02/09/13 0336  AST 80* 48* 35  ALT 43 39 35  ALKPHOS 57 66 69  BILITOT 0.7 0.5 0.6  PROT 5.3* 5.5* 5.5*  ALBUMIN 2.9* 2.6* 2.5*   No results found for this basename: LIPASE, AMYLASE,  in the last 168 hours No results found for this basename: AMMONIA,  in the last 168 hours CBC:  Recent Labs Lab 02/08/13 0350  02/08/13 1645  02/09/13 0336 02/10/13 0400 02/11/13 0451  WBC 18.6* 16.2*  --  14.0* 11.1* 12.7*  HGB 7.8* 7.2*  < > 8.4* 9.3* 9.6*  HCT 23.2* 21.3*  < > 24.1* 26.9* 29.1*  MCV 90.3 88.8  --  87.3 89.4 92.4  PLT 117* 104*  --  119* 148* 187  < > = values in this interval not displayed. Cardiac Enzymes: No results found for this basename: CKTOTAL, CKMB, CKMBINDEX, TROPONINI,  in the last 168 hours CBG:  Recent Labs Lab 02/10/13 1603 02/10/13 2003 02/10/13 2353 02/11/13 0413 02/11/13 0810  GLUCAP 110* 107* 122* 118* 124*    Iron  Studies:  Recent Labs  02/08/13 1145  IRON 12*  TIBC 181*  FERRITIN 493*   Studies/Results: Dg Chest Port 1 View  02/11/2013   *RADIOLOGY REPORT*  Clinical Data: Follow up edema  PORTABLE CHEST - 1 VIEW  Comparison: 02/10/2013  Findings: There is a right IJ catheter sheath in place.  The Theone Murdoch catheter has been removed.  Median sternotomy and CABG procedure noted.  Bilateral interstitial and airspace opacities are again identified.  When compared with the previous exam there has been decreased aeration to the left upper lobe.  Improved aeration to the right base.  IMPRESSION:  1.  Removal of Swan-Ganz catheter. 2.  Increased opacification in the left upper lobe with improved aeration to the right base.   Original Report Authenticated By: Signa Kell, M.D.   Dg Chest Port 1 View  02/10/2013   *RADIOLOGY REPORT*  Clinical Data: Post mitral valve replacement and repair of patent foramen ovale, follow-up  PORTABLE CHEST - 1 VIEW  Comparison: Portable chest x-ray of 02/09/2013  Findings: The endotracheal tube is unchanged in position as is the Swan-Ganz catheter.  The left chest tube has been removed and no pneumothorax is seen.  There is still persistent congestive heart failure present with cardiomegaly and pulmonary vascular congestion and probable small effusions.  IMPRESSION: Left chest tube removed.  No pneumothorax.  Persistent CHF.   Original  Report Authenticated By: Dwyane Dee, M.D.   . aspirin EC  81 mg Oral Daily   Or  . aspirin  81 mg Per Tube Daily  . atorvastatin  80 mg Oral q1800  . bisacodyl  10 mg Oral Daily   Or  . bisacodyl  10 mg Rectal Daily  . cefTRIAXone (ROCEPHIN)  IV  2 g Intravenous Q24H  . docusate sodium  200 mg Oral Daily  . enoxaparin  30 mg Subcutaneous Q24H  . feeding supplement  1 Container Oral Q1500  . insulin aspart  0-15 Units Subcutaneous TID WC  . metoCLOPramide (REGLAN) injection  5 mg Intravenous Q6H  . metoprolol tartrate  12.5 mg Oral BID   Or  . metoprolol tartrate  12.5 mg Per Tube BID  . pantoprazole  40 mg Oral Q1200  . potassium chloride  10 mEq Intravenous Q1 Hr x 4  . sodium chloride  3 mL Intravenous Q12H  . Warfarin - Physician Dosing Inpatient   Does not apply q1800  . warfarin IV  2.5 mg Intravenous q1800    BMET    Component Value Date/Time   NA 140 02/11/2013 0451   K 3.8 02/11/2013 0451   CL 101 02/11/2013 0451   CO2 29 02/11/2013 0451   GLUCOSE 135* 02/11/2013 0451   BUN 48* 02/11/2013 0451   CREATININE 1.68* 02/11/2013 0451   CALCIUM 8.8 02/11/2013 0451   GFRNONAA 40* 02/11/2013 0451   GFRAA 47* 02/11/2013 0451   CBC    Component Value Date/Time   WBC 12.7* 02/11/2013 0451   RBC 3.15* 02/11/2013 0451   HGB 9.6* 02/11/2013 0451   HCT 29.1* 02/11/2013 0451   PLT 187 02/11/2013 0451   MCV 92.4 02/11/2013 0451   MCH 30.5 02/11/2013 0451   MCHC 33.0 02/11/2013 0451   RDW 14.8 02/11/2013 0451   LYMPHSABS 2.2 01/31/2013 0950   MONOABS 1.8* 01/31/2013 0950   EOSABS 0.1 01/31/2013 0950   BASOSABS 0.0 01/31/2013 0950     Assessment/Plan:  1. AKI/CKD non-oliguric. (CKD stage 3: baseline Scr ~1.3-1.49). Marked improvement of  Scr as well ask diuresis off of lasix gtt.  Now near baseline.   2. CAD s/p AMI- now s/p CABG/MVR/PFO repair- off IABP, milrinone and dopamine gtt.  3. SVT/wide-complex tachycardia yesterday evening. S/p cardioversion from SVT. Stable for now 4. CHF- improving volume status.  Good UOP off of lasix gtt. Cont to follow 5. VDRF- s/p extubation and doing well 6. ABLA- follow and transfuse as needed 7. Hypokalemia- repleted and follow. Will be better to control now that he is off lasix gtt 8. Hyponatremia- due to AKI/vol overload. REsolved.  9. Thrombocytopenia- improving.  10. Nothing further to add. Will sign off.  Please call with questions or concerns.   Hilliary Jock A

## 2013-02-11 NOTE — Progress Notes (Signed)
Physical Therapy Evaluation Patient Details Name: Lonnie [name] MRN: [id number] DOB: 04/04/1946 Today's Date: 02/11/2013 Time: 2725-3664 PT Time Calculation (min): 16 min  PT Assessment / Plan / Recommendation Clinical Impression  Pt s/p MI and CABG with decr mobility secondary to decr endurance and decr postural stability.  Will benefit from PT to address mobility issues.  Will need HHPT, RW and 3N1.      PT Assessment  Patient needs continued PT services    Follow Up Recommendations  Home health PT;Supervision/Assistance - 24 hour                Equipment Recommendations  Rolling walker with 5" wheels;Other (comment) (3N1)         Frequency Min 3X/week    Precautions / Restrictions Precautions Precautions: Fall;Sternal Restrictions Weight Bearing Restrictions: No   Pertinent Vitals/Pain VSS, No pain      Mobility  Bed Mobility Bed Mobility: Not assessed Transfers Transfers: Sit to Stand;Stand to Sit Sit to Stand: 1: +2 Total assist;Without upper extremity assist;From chair/3-in-1 Sit to Stand: Patient Percentage: 60% Stand to Sit: 1: +2 Total assist;Without upper extremity assist;To chair/3-in-1 Stand to Sit: Patient Percentage: 60% Details for Transfer Assistance: cues for precautions Ambulation/Gait Ambulation/Gait Assistance: 4: Min assist (+1 for lines and chair follow) Ambulation Distance (Feet): 100 Feet Assistive device: Other (Comment) (pushing wheelchair) Ambulation/Gait Assistance Details: Pt ambulated pushing wheelchair needing cues for step sequence and postural control.  Needed 6LO2 Gait Pattern: Step-through pattern;Decreased stride length;Wide base of support;Trunk flexed Gait velocity: decreased Stairs: No Wheelchair Mobility Wheelchair Mobility: No         PT Diagnosis: Generalized weakness  PT Problem List: Decreased activity tolerance;Decreased balance;Decreased mobility;Decreased knowledge of use of DME;Decreased safety  awareness;Decreased knowledge of precautions PT Treatment Interventions: DME instruction;Gait training;Functional mobility training;Therapeutic activities;Therapeutic exercise;Balance training;Patient/family education   PT Goals Acute Rehab PT Goals PT Goal Formulation: With patient Time For Goal Achievement: 02/18/13 Potential to Achieve Goals: Good Pt will go Supine/Side to Sit: Independently PT Goal: Supine/Side to Sit - Progress: Goal set today Pt will go Sit to Stand: Independently PT Goal: Sit to Stand - Progress: Goal set today Pt will Transfer Bed to Chair/Chair to Bed: Independently PT Transfer Goal: Bed to Chair/Chair to Bed - Progress: Goal set today Pt will Ambulate: >150 feet;with modified independence;with least restrictive assistive device PT Goal: Ambulate - Progress: Goal set today Pt will Go Up / Down Stairs: 1-2 stairs;with modified independence;with least restrictive assistive device PT Goal: Up/Down Stairs - Progress: Goal set today  Visit Information  Last PT Received On: 02/11/13 Assistance Needed: +2    Subjective Data  Subjective: "I feel bettter." Patient Stated Goal: To go home   Prior Functioning  Home Living Lives With: Spouse Available Help at Discharge: Family;Available 24 hours/day Type of Home: House Home Access: Stairs to enter [employer] of Steps: 2 Entrance Stairs-Rails: None Home Layout: Two level;Able to live on main level with bedroom/bathroom;Full bath on main level Bathroom Shower/Tub: Engineer, manufacturing systems: Standard Home Adaptive Equipment: None Prior Function Level of Independence: Independent Able to Take Stairs?: Yes Driving: Yes 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 Communication Communication: No difficulties    Cognition  Cognition Arousal/Alertness: Awake/alert Behavior During Therapy: WFL for tasks assessed/performed Overall Cognitive Status: Within Functional Limits for  tasks assessed    Extremity/Trunk Assessment Right Lower Extremity Assessment RLE ROM/Strength/Tone: [facility] for tasks assessed Left Lower Extremity Assessment LLE ROM/Strength/Tone: [facility] [place] for tasks assessed  Balance Static Standing Balance Static Standing - Balance Support: Bilateral upper extremity supported;During functional activity Static Standing - Level of Assistance: 4: Min assist Static Standing - Comment/# of Minutes:  3 min holding onto wheelchair  End of Session PT - End of Session Equipment Utilized During Treatment: Gait belt;Oxygen Activity Tolerance: Patient limited by fatigue Patient left: in chair;with call bell/phone within reach Nurse Communication: Mobility status       INGOLD,Donal Lynam 02/11/2013, 11:36 AM [facility] [phone] (pager)

## 2013-02-11 NOTE — Progress Notes (Addendum)
Occupational Therapy Evaluation Patient Details Name: Lonnie [name] MRN: [id number] DOB: 01-21-1946 Today's Date: 02/11/2013 Time: 7846-9629 OT Time Calculation (min): 33 min  OT Assessment / Plan / Recommendation Clinical Impression  Pt s/p MI and CABG. PTA, pt independent with ADL and mobility and worked full time. Pt will benefit from skilled OT services to facilitate D/C home with 24/7 S of wife due to below deficits. Educated pt on sternal precautions.    OT Assessment  Patient needs continued OT Services    Follow Up Recommendations  No OT follow up    Barriers to Discharge None    Equipment Recommendations  3 in 1 bedside comode    Recommendations for Other Services    Frequency  Min 2X/week    Precautions / Restrictions Precautions Precautions: Fall;Sternal Restrictions Weight Bearing Restrictions: No   Pertinent Vitals/Pain O2 desat to 88 when briefly taken off for mobility. HR 80s with mobility.  O2 at rest 96 4L    ADL  Grooming: Set up Where Assessed - Grooming: Unsupported sitting Upper Body Bathing: Minimal assistance Where Assessed - Upper Body Bathing: Unsupported sitting Lower Body Bathing: Maximal assistance Where Assessed - Lower Body Bathing: Supported sit to stand Upper Body Dressing: Moderate assistance Where Assessed - Upper Body Dressing: Unsupported sitting Lower Body Dressing: Maximal assistance Where Assessed - Lower Body Dressing: Supported sit to stand Toilet Transfer: Minimal assistance Toilet Transfer Method: Sit to Barista: Other (comment) (simulated) Toileting - Clothing Manipulation and Hygiene:  (foley) Equipment Used: Gait belt Transfers/Ambulation Related to ADLs: min A sit - stand ADL Comments: limited by general weakness and sternotomy precuations    OT Diagnosis: Generalized weakness;Acute pain  OT Problem List: Decreased strength;Decreased activity tolerance;Decreased knowledge of use of DME or  AE;Decreased knowledge of precautions;Cardiopulmonary status limiting activity;Pain OT Treatment Interventions: Self-care/ADL training;Therapeutic exercise;DME and/or AE instruction;Therapeutic activities;Patient/family education;Energy conservation   OT Goals Acute Rehab OT Goals OT Goal Formulation: With patient Time For Goal Achievement: 02/25/13 Potential to Achieve Goals: Good ADL Goals Pt Will Perform Upper Body Bathing: with supervision;with caregiver independent in assisting;with set-up;Sitting, chair ADL Goal: Upper Body Bathing - Progress: Goal set today Pt Will Perform Upper Body Dressing: with supervision;with set-up;with caregiver independent in assisting;Sitting, chair ADL Goal: Upper Body Dressing - Progress: Goal set today Pt Will Perform Lower Body Dressing: with set-up;with supervision;with caregiver independent in assisting;Sit to stand from chair ADL Goal: Lower Body Dressing - Progress: Goal set today Pt Will Transfer to Toilet: with set-up;with supervision;with caregiver independent in assisting;Ambulation;with DME;3-in-1 ADL Goal: Toilet Transfer - Progress: Goal set today Pt Will Perform Toileting - Clothing Manipulation: with modified independence;Standing;Sitting on 3-in-1 or toilet ADL Goal: Toileting - Clothing Manipulation - Progress: Goal set today Additional ADL Goal #1: Pt will verbalize sternal precautions for ADL and mobility ADL Goal: Additional Goal #1 - Progress: Goal set today  Visit Information  Last OT Received On: 02/11/13 Assistance Needed: +2    Subjective Data      Prior Functioning     Home Living Lives With: Spouse Available Help at Discharge: Family;Available 24 hours/day Type of Home: House Home Access: Stairs to enter [employer] of Steps: 2 Entrance Stairs-Rails: None Home Layout: Two level;Able to live on main level with bedroom/bathroom;Full bath on main level Bathroom Shower/Tub: Engineer, manufacturing systems:  Standard Bathroom Accessibility: Yes How Accessible: Accessible via walker Home Adaptive Equipment: None Prior Function Level of Independence: Independent Able to Take Stairs?: Yes Driving: Yes  Vocation: Full time employment Communication Communication: No difficulties         Vision/Perception     Cognition  Cognition Arousal/Alertness: Awake/alert Behavior During Therapy: WFL for tasks assessed/performed Overall Cognitive Status: Within Functional Limits for tasks assessed    Extremity/Trunk Assessment Right Upper Extremity Assessment RUE ROM/Strength/Tone: WFL for tasks assessed Left Upper Extremity Assessment LUE ROM/Strength/Tone: WFL for tasks assessed Right Lower Extremity Assessment RLE ROM/Strength/Tone: WFL for tasks assessed Left Lower Extremity Assessment LLE ROM/Strength/Tone: WFL for tasks assessed     Mobility Bed Mobility Bed Mobility: Rolling Left;Left Sidelying to Sit;Sitting - Scoot to Edge of Bed Rolling Left: 4: Min assist Left Sidelying to Sit: 3: Mod assist;HOB flat Sitting - Scoot to Edge of Bed: 5: Supervision Details for Bed Mobility Assistance: vc for sternal precautions. good follow through Transfers Transfers: Sit to Stand;Stand to Sit Sit to Stand: Without upper extremity assist;4: Min assist;From bed Stand to Sit: 4: Min assist Details for Transfer Assistance: cues for precautions. using bed to stabilize     Exercise     Balance Static Standing Balance Static Standing - Balance Support: Bilateral upper extremity supported;During functional activity Static Standing - Level of Assistance: 4: Min assist   End of Session OT - End of Session Activity Tolerance: Patient limited by fatigue Patient left: in bed;with call bell/phone within reach Nurse Communication: Mobility status  GO     Lonnie [name],Lonnie [name] 02/11/2013, 4:15 PM [facility], OTR/L  [phone] 02/11/2013

## 2013-02-11 NOTE — Progress Notes (Signed)
PULMONARY  / CRITICAL CARE MEDICINE  Name: Lonnie [name] MRN: [id number] DOB: 04-21-1946    ADMISSION DATE:  01/31/2013 CONSULTATION DATE:  02/08/2013  REFERRING MD :  Dorris Fetch PRIMARY SERVICE: CVTS  CHIEF COMPLAINT:  VDRF  BRIEF PATIENT DESCRIPTION: 67 year old male with CAD presents from [facility] with STEMI, CRI, had a cath then 3 vessel CABG mitral valve placement and closure of PFO.  Cr continued to rise post op up to 3.5 and has been on a lasix drip since on multiple pressors.  PCCM called bedside due to SVT with wide complex and inability to come off the vent.  SIGNIFICANT EVENTS / STUDIES:  5/29 CABG 5/30 evolving renal failure. 6/1 SVT with wide complex requiring cardioversion. 6/3 Extubation, borderline SpO2  LINES / TUBES: ET tube 5/29>>>6/3 L IJ PAC sleeve 5/29>>> R radial a-line 5/29>> 6/04  CULTURES: Blood 6/1>>>Neg Urine 6/1>>>neg Sputum 6/1>>> NOF  ANTIBIOTICS: Rocephin 6/2 (bronchitis) >>  SUBJECTIVE:  Tolerating extubation. Calm. Alert. No distress. No complaints   VITAL SIGNS: Temp:  [97.7 F (36.5 C)-98.4 F (36.9 C)] 98 F (36.7 C) (06/04 0809) Pulse Rate:  [61-104] 80 (06/04 0900) Resp:  [13-27] 21 (06/04 0900) BP: (96-131)/(66-87) 131/87 mmHg (06/04 0900) SpO2:  [91 %-97 %] 95 % (06/04 0900) Arterial Line BP: (104-153)/(44-61) 142/58 mmHg (06/04 0800) Weight:  [105.8 kg (233 lb 4 oz)] 105.8 kg (233 lb 4 oz) (06/04 0500) HEMODYNAMICS:    VENTILATOR SETTINGS:   On PSV 5/5>>good parameters  INTAKE / OUTPUT: Intake/Output     06/03 0701 - 06/04 0700 06/04 0701 - 06/05 0700   P.O. 420    I.V. (mL/kg) 612.8 (5.8)    IV Piggyback 50    Total Intake(mL/kg) 1082.8 (10.2)    Urine (mL/kg/hr) 2695 (1.1) 400 (1.1)   Total Output 2695 400   Net -1612.2 -400          PHYSICAL EXAMINATION: General:  NAD Neuro:  Awake and alert HEENT:  WNL Cardiovascular:  RRR s M Lungs: clear anteriorly Abdomen:  Soft, NT, ND and  +BS. Musculoskeletal:  2 + edema and -tenderness. Skin:  Intact, wound clean.  LABS:  Recent Labs Lab 02/05/13 1920  02/06/13 0127  02/07/13 0400 02/08/13 0350 02/08/13 0913 02/08/13 1645 02/08/13 1738 02/08/13 1743 02/09/13 0336 02/09/13 0500 02/09/13 1500 02/10/13 0126 02/10/13 0400 02/11/13 0451  HGB 8.9*  < >  --   < > 8.0* 7.8*  --  7.2*  --  7.1* 8.4*  --   --   --  9.3* 9.6*  WBC 25.3*  < >  --   < > 17.8* 18.6*  --  16.2*  --   --  14.0*  --   --   --  11.1* 12.7*  PLT 117*  < >  --   < > 105* 117*  --  104*  --   --  119*  --   --   --  148* 187  NA  --   < >  --   < > 135 132*  --   --   --  136 133*  --  137 136  --  140  K  --   < >  --   < > 5.0 4.5  --   --   --  3.5 3.4*  --  3.6 3.9  --  3.8  CL  --   --   --   < > 100 97  --   --   --  101 96  --  97 97  --  101  CO2  --   --   --   < > 24 24  --   --   --   --  25  --  27 27  --  29  GLUCOSE  --   < >  --   < > 162* 118*  --   --   --  113* 89  --  90 87  --  135*  BUN  --   --   --   < > 46* 61*  --   --   --  53* 62*  --  58* 54*  --  48*  CREATININE  --   --   --   < > 2.66* 3.48*  --  3.11*  --  2.60* 2.72*  --  2.44* 2.14*  --  1.68*  CALCIUM  --   --   --   < > 8.2* 8.0*  --   --   --   --  8.3*  --  8.6 8.7  --  8.8  MG  --   --   --   < >  --   --   --  2.8*  --   --  2.7*  --   --  2.3  --   --   PHOS  --   --   --   --   --   --   --   --   --   --  5.3*  --   --   --   --   --   AST  --   --   --   --  80* 48*  --   --   --   --  35  --   --   --   --   --   ALT  --   --   --   --  43 39  --   --   --   --  35  --   --   --   --   --   ALKPHOS  --   --   --   --  57 66  --   --   --   --  69  --   --   --   --   --   BILITOT  --   --   --   --  0.7 0.5  --   --   --   --  0.6  --   --   --   --   --   PROT  --   --   --   --  5.3* 5.5*  --   --   --   --  5.5*  --   --   --   --   --   ALBUMIN  --   --   --   --  2.9* 2.6*  --   --   --   --  2.5*  --   --   --   --   --   APTT 46*  --  48*  --    --   --   --   --   --   --   --   --   --   --   --   --   INR 2.01*  --  1.70*  --   --  1.34  --   --   --   --  1.32  --   --   --  1.32 1.41  PROBNP  --   --   --   --   --   --   --   --   --   --  16248.0*  --   --   --   --   --   PHART 7.314*  < >  --   < >  --   --  7.343*  --  7.372  --   --  7.455*  --   --   --   --   PCO2ART 48.1*  < >  --   < >  --   --  43.7  --  39.5  --   --  36.0  --   --   --   --   PO2ART 54.0*  < >  --   < >  --   --  65.0*  --  240.0*  --   --  152.0*  --   --   --   --   < > = values in this interval not displayed.  Recent Labs Lab 02/10/13 1603 02/10/13 2003 02/10/13 2353 02/11/13 0413 02/11/13 0810  GLUCAP 110* 107* 122* 118* 124*    CXR: L>R patchy AS dz  ASSESSMENT / PLAN:  PULMONARY A: VDRF due to pulmonary edema from cardiac disease and renal failure. Improved  P:   - tolerating extubation - Cont usual post op resp care and monitoring   CARDIOVASCULAR A: SVT on 6/1, poor EF, cardiogenic shock.  S/p MVR, CABG 5/29 EF 30-35% on echo 6/1  P:  - Mgmt per TCTS  RENAL A: acute on chronic renal failure, Cr better P:   - Cont to monitor - renal following   GASTROINTESTINAL A:  No active issues. P:   - advance diet as tolerated  HEMATOLOGIC A:  Post op anemia - no indication for PRBCs P:  - Monitor CBC.  INFECTIOUS A: Purulent bronchitis vs PNA P:   - cont Rocephin ordered per TCTS Day 1/X - f/u  cultures.  ENDOCRINE A:  Hyperglycemia.   P:   - Cont SSI  NEUROLOGIC A:  Alert and interactive. P:   - Minimize delirigenic meds.  PCCM will sign off. Please call if we can be of further assistance  Billy Fischer, MD ; [facility] [phone].  After 5:30 PM or weekends, call [phone]

## 2013-02-11 NOTE — Progress Notes (Signed)
Patient states he has having trouble sleeping, and wanted to know if he could have something to help him sleep.  Patient was repositioned, pain medication was given, and patient was provided emotional support, however patient still was not able to sleep.  Paged CCM (Dr. Delford Field), received orders for Benadryl 25mg  PO Once for sleep.  Will administer med and continue to provide support.

## 2013-02-11 NOTE — Progress Notes (Signed)
Reported off to oncoming shift RN. Safety maintained this shift. VSS. No acute distress noted.

## 2013-02-11 NOTE — Progress Notes (Signed)
Patient ID: Lonnie [name], male   DOB: [birth year], 67 y.o.   MRN: [id number]   Dopamine weaned off earlier today. He was making 100-250 cc urine per hour on dopamine but since dopamine off BP has been lower and he is oliguric 0-10 cc per hr.  Will resume dopamine for now. His creat went up postop but was down some this am.

## 2013-02-11 NOTE — Progress Notes (Signed)
6 Days Post-Op Procedure(s) (LRB): INTRAOPERATIVE TRANSESOPHAGEAL ECHOCARDIOGRAM (N/A) ENDOVEIN HARVEST OF GREATER SAPHENOUS VEIN (Right) PATENT FORAMEN OVALE CLOSURE (N/A) MITRAL VALVE REPLACEMENT (MVR)/CORONARY ARTERY BYPASS GRAFTING (CABG) (N/A) Subjective: C/o being tired, has not slept well  Denies pain, SOB, nausea  Required assistance getting to chair  Objective: Vital signs in last 24 hours: Temp:  [97.7 F (36.5 C)-98.8 F (37.1 C)] 97.9 F (36.6 C) (06/04 0300) Pulse Rate:  [61-104] 78 (06/04 0700) Cardiac Rhythm:  [-] Ventricular paced (06/03 1930) Resp:  [13-27] 13 (06/04 0700) BP: (96-128)/(56-77) 115/69 mmHg (06/04 0700) SpO2:  [91 %-97 %] 93 % (06/04 0700) Arterial Line BP: (104-153)/(44-61) 135/56 mmHg (06/04 0700) Weight:  [233 lb 4 oz (105.8 kg)] 233 lb 4 oz (105.8 kg) (06/04 0500)  Hemodynamic parameters for last 24 hours: PAP: (34-35)/(21-23) 34/21 mmHg  Intake/Output from previous day: 06/03 0701 - 06/04 0700 In: 1082.8 [P.O.:420; I.V.:612.8; IV Piggyback:50] Out: 2695 [Urine:2695] Intake/Output this shift:    General appearance: alert and no distress Neurologic: intact Heart: regular rate and rhythm Lungs: diminished breath sounds bibasilar Abdomen: distended, + BS, nontender  Lab Results:  Recent Labs  02/10/13 0400 02/11/13 0451  WBC 11.1* 12.7*  HGB 9.3* 9.6*  HCT 26.9* 29.1*  PLT 148* 187   BMET:  Recent Labs  02/10/13 0126 02/11/13 0451  NA 136 140  K 3.9 3.8  CL 97 101  CO2 27 29  GLUCOSE 87 135*  BUN 54* 48*  CREATININE 2.14* 1.68*  CALCIUM 8.7 8.8    PT/INR:  Recent Labs  02/11/13 0451  LABPROT 16.9*  INR 1.41   ABG    Component Value Date/Time   PHART 7.455* 02/09/2013 0500   HCO3 25.0* 02/09/2013 0500   TCO2 26.1 02/09/2013 0500   ACIDBASEDEF 2.0 02/08/2013 1738   O2SAT 99.2 02/09/2013 0500   CBG (last 3)   Recent Labs  02/10/13 2003 02/10/13 2353 02/11/13 0413  GLUCAP 107* 122* 118*     Assessment/Plan: S/P Procedure(s) (LRB): INTRAOPERATIVE TRANSESOPHAGEAL ECHOCARDIOGRAM (N/A) ENDOVEIN HARVEST OF GREATER SAPHENOUS VEIN (Right) PATENT FORAMEN OVALE CLOSURE (N/A) MITRAL VALVE REPLACEMENT (MVR)/CORONARY ARTERY BYPASS GRAFTING (CABG) (N/A) - CV- stable hemodynamics- SR with some PVC and PAC  Coumadin for MVR  RESP- still with pulmonary edema- will resume diuresis, IS, flutter  RENAL- creatinine continues to improve, good UO  Supplement K  ILEUS- abdomen distended, but nontender and has good BS  CBG well controlled- change to AC/HS  Ambulate   LOS: 11 days    Lonnie [name] C 02/11/2013

## 2013-02-11 NOTE — Progress Notes (Signed)
SUBJECTIVE:  Uncomfortable but improved.  Some SOB.    PHYSICAL EXAM Filed Vitals:   02/11/13 1500 02/11/13 1557 02/11/13 1600 02/11/13 1700  BP: 121/79  108/67 125/72  Pulse: 78  81 80  Temp:  98.1 F (36.7 C)    TempSrc:  Oral    Resp: 13  21 19   Height:      Weight:      SpO2: 93%  93% 96%   General:  No distress. Lungs:  Basilar crackles Heart:  RRR, no rub Abdomen:  Positive bowel sounds, no rebound no guarding Extremities:  Diffuse edema   LABS:  Results for orders placed during the hospital encounter of 01/31/13 (from the past 24 hour(s))  GLUCOSE, CAPILLARY     Status: Abnormal   Collection Time    02/10/13  8:03 PM      Result Value Range   Glucose-Capillary 107 (*) 70 - 99 mg/dL   Comment 1 Documented in Chart     Comment 2 Notify RN    GLUCOSE, CAPILLARY     Status: Abnormal   Collection Time    02/10/13 11:53 PM      Result Value Range   Glucose-Capillary 122 (*) 70 - 99 mg/dL  GLUCOSE, CAPILLARY     Status: Abnormal   Collection Time    02/11/13  4:13 AM      Result Value Range   Glucose-Capillary 118 (*) 70 - 99 mg/dL   Comment 1 Documented in Chart     Comment 2 Notify RN    PROTIME-INR     Status: Abnormal   Collection Time    02/11/13  4:51 AM      Result Value Range   Prothrombin Time 16.9 (*) 11.6 - 15.2 seconds   INR 1.41  0.00 - 1.49  BASIC METABOLIC PANEL     Status: Abnormal   Collection Time    02/11/13  4:51 AM      Result Value Range   Sodium 140  135 - 145 mEq/L   Potassium 3.8  3.5 - 5.1 mEq/L   Chloride 101  96 - 112 mEq/L   CO2 29  19 - 32 mEq/L   Glucose, Bld 135 (*) 70 - 99 mg/dL   BUN 48 (*) 6 - 23 mg/dL   Creatinine, Ser 1.61 (*) 0.50 - 1.35 mg/dL   Calcium 8.8  8.4 - 09.6 mg/dL   GFR calc non Af Amer 40 (*) >90 mL/min   GFR calc Af Amer 47 (*) >90 mL/min  CBC     Status: Abnormal   Collection Time    02/11/13  4:51 AM      Result Value Range   WBC 12.7 (*) 4.0 - 10.5 K/uL   RBC 3.15 (*) 4.22 - 5.81 MIL/uL   Hemoglobin 9.6 (*) 13.0 - 17.0 g/dL   HCT 04.5 (*) 40.9 - 81.1 %   MCV 92.4  78.0 - 100.0 fL   MCH 30.5  26.0 - 34.0 pg   MCHC 33.0  30.0 - 36.0 g/dL   RDW 91.4  78.2 - 95.6 %   Platelets 187  150 - 400 K/uL  GLUCOSE, CAPILLARY     Status: Abnormal   Collection Time    02/11/13  8:10 AM      Result Value Range   Glucose-Capillary 124 (*) 70 - 99 mg/dL  GLUCOSE, CAPILLARY     Status: Abnormal   Collection Time    02/11/13 11:51  AM      Result Value Range   Glucose-Capillary 117 (*) 70 - 99 mg/dL  GLUCOSE, CAPILLARY     Status: Abnormal   Collection Time    02/11/13  6:06 PM      Result Value Range   Glucose-Capillary 148 (*) 70 - 99 mg/dL    Intake/Output Summary (Last 24 hours) at 02/11/13 1817 Last data filed at 02/11/13 1700  Gross per 24 hour  Intake 1525.6 ml  Output   3315 ml  Net -1789.4 ml    ASSESSMENT AND PLAN:  VENTRICULAR TACHYCARDIA:  No further NSVT.  Continue IV amiodarone today and probably stop in the AM.  Now on low dose beta blocker.   CARDIOMYOPATHY:  Ischemic.  Good UO with Lasix given this AM.   CABG/MVR:  Extubated yesterday.  Off low dose dopamine.   CKD:  Creat continues to improve.     Rollene Rotunda 02/11/2013 6:17 PM

## 2013-02-12 ENCOUNTER — Inpatient Hospital Stay (HOSPITAL_COMMUNITY): Payer: Managed Care, Other (non HMO)

## 2013-02-12 LAB — POCT I-STAT 3, ART BLOOD GAS (G3+)
Acid-Base Excess: 6 mmol/L — ABNORMAL HIGH (ref 0.0–2.0)
pO2, Arterial: 70 mmHg — ABNORMAL LOW (ref 80.0–100.0)

## 2013-02-12 LAB — CBC
HCT: 28.7 % — ABNORMAL LOW (ref 39.0–52.0)
Hemoglobin: 9.3 g/dL — ABNORMAL LOW (ref 13.0–17.0)
MCH: 30.6 pg (ref 26.0–34.0)
MCHC: 32.4 g/dL (ref 30.0–36.0)
MCV: 94.4 fL (ref 78.0–100.0)
RBC: 3.04 MIL/uL — ABNORMAL LOW (ref 4.22–5.81)

## 2013-02-12 LAB — BASIC METABOLIC PANEL
BUN: 36 mg/dL — ABNORMAL HIGH (ref 6–23)
CO2: 33 mEq/L — ABNORMAL HIGH (ref 19–32)
Calcium: 8.9 mg/dL (ref 8.4–10.5)
Creatinine, Ser: 1.38 mg/dL — ABNORMAL HIGH (ref 0.50–1.35)
Glucose, Bld: 116 mg/dL — ABNORMAL HIGH (ref 70–99)

## 2013-02-12 LAB — GLUCOSE, CAPILLARY
Glucose-Capillary: 112 mg/dL — ABNORMAL HIGH (ref 70–99)
Glucose-Capillary: 119 mg/dL — ABNORMAL HIGH (ref 70–99)
Glucose-Capillary: 115 mg/dL — ABNORMAL HIGH (ref 70–99)

## 2013-02-12 MED ORDER — POTASSIUM CHLORIDE 10 MEQ/50ML IV SOLN
10.0000 meq | INTRAVENOUS | Status: AC
  Administered 2013-02-12 (×4): 10 meq via INTRAVENOUS
  Filled 2013-02-12: qty 200

## 2013-02-12 MED ORDER — BISACODYL 10 MG RE SUPP
10.0000 mg | Freq: Once | RECTAL | Status: AC
  Administered 2013-02-12: 10 mg via RECTAL
  Filled 2013-02-12: qty 1

## 2013-02-12 MED ORDER — WARFARIN SODIUM 2.5 MG PO TABS
2.5000 mg | ORAL_TABLET | Freq: Every day | ORAL | Status: DC
  Administered 2013-02-12: 2.5 mg via ORAL
  Filled 2013-02-12 (×2): qty 1

## 2013-02-12 MED ORDER — AMIODARONE HCL 200 MG PO TABS
400.0000 mg | ORAL_TABLET | Freq: Two times a day (BID) | ORAL | Status: DC
  Administered 2013-02-12 – 2013-02-15 (×7): 400 mg via ORAL
  Filled 2013-02-12 (×8): qty 2

## 2013-02-12 MED ORDER — FUROSEMIDE 10 MG/ML IJ SOLN
INTRAMUSCULAR | Status: AC
  Filled 2013-02-12: qty 4

## 2013-02-12 MED ORDER — METOCLOPRAMIDE HCL 5 MG/ML IJ SOLN
10.0000 mg | Freq: Four times a day (QID) | INTRAMUSCULAR | Status: AC
  Administered 2013-02-12 – 2013-02-13 (×4): 10 mg via INTRAVENOUS
  Filled 2013-02-12 (×4): qty 2

## 2013-02-12 MED ORDER — FUROSEMIDE 10 MG/ML IJ SOLN
40.0000 mg | Freq: Three times a day (TID) | INTRAMUSCULAR | Status: AC
  Administered 2013-02-12 – 2013-02-13 (×3): 40 mg via INTRAVENOUS
  Filled 2013-02-12 (×2): qty 4

## 2013-02-12 NOTE — Progress Notes (Deleted)
Patient stated that his foley was bothering him, foley was repositioned, it was flushed, and patient still said it was bothering him.  Sat patient up on the side of the bed and stood him up to see if that would help drain his catheter and relieve his pressure, patient states it helps. Upon getting patient back to bed his oxygen saturation sats dropped into the mid 80s, his oxygen was increased from 4L to 6L via nasal cannula.  His stats still remained in the 80s,  Patient was asked to take slow deep breaths, he did his incentive spirometer and flutter valve, he did some CDB, his sats were still in the 80s, asked RT to help, patient was placed on a Venturi Mask at 15L at 50%, his sats did not respond to that, therefore, non rebreather mask was applied, patients sats came up to upper 90s -100%.  During this period, patient was not in any distress, he was fully alert, neuro intact, and all other VS were stable.

## 2013-02-12 NOTE — Progress Notes (Signed)
Patient ID: Lonnie [name], male   DOB: 04-Jan-1946, 67 y.o.   MRN: [id number] EVENING ROUNDS NOTE :     [address]       [place] [id number]             [phone]                 7 Days Post-Op Procedure(s) (LRB): INTRAOPERATIVE TRANSESOPHAGEAL ECHOCARDIOGRAM (N/A) ENDOVEIN HARVEST OF GREATER SAPHENOUS VEIN (Right) PATENT FORAMEN OVALE CLOSURE (N/A) MITRAL VALVE REPLACEMENT (MVR)/CORONARY ARTERY BYPASS GRAFTING (CABG) (N/A)  Total Length of Stay:  LOS: 12 days  BP 105/69  Pulse 77  Temp(Src) 98.4 F (36.9 C) (Oral)  Resp 14  Ht 6\' 1"  (1.854 m)  Wt 233 lb 4 oz (105.8 kg)  BMI 30.78 kg/m2  SpO2 93%  .Intake/Output     06/05 0701 - 06/06 0700   P.O. 940   I.V. (mL/kg) 415.2 (3.9)   IV Piggyback 250   Total Intake(mL/kg) 1605.2 (15.2)   Urine (mL/kg/hr) 2200 (1.7)   Total Output 2200   Net -594.8         . sodium chloride 20 mL/hr (02/12/13 0900)  . sodium chloride Stopped (02/12/13 0900)  . sodium chloride 250 mL (02/08/13 2357)  . DOPamine Stopped (02/12/13 1300)  . lactated ringers Stopped (02/07/13 0000)     Lab Results  Component Value Date   WBC 10.4 02/12/2013   HGB 9.3* 02/12/2013   HCT 28.7* 02/12/2013   PLT 202 02/12/2013   GLUCOSE 116* 02/12/2013   CHOL 149 02/01/2013   TRIG 114 02/01/2013   HDL 31* 02/01/2013   LDLCALC 95 02/01/2013   ALT 35 02/09/2013   AST 35 02/09/2013   NA 140 02/12/2013   K 3.8 02/12/2013   CL 101 02/12/2013   CREATININE 1.38* 02/12/2013   BUN 36* 02/12/2013   CO2 33* 02/12/2013   TSH 2.711 02/01/2013   INR 1.76* 02/12/2013   HGBA1C 5.3 02/04/2013   Much improved from several days ago, walked in unit In afib Cr now 1.38   Delight Ovens MD  [facility] [phone] Office [phone] 02/12/2013 7:29 PM

## 2013-02-12 NOTE — Progress Notes (Signed)
Spoke with MD about patient UOP dropping to 15cc (2000), 10 (2100), and 0cc (2200).  MD gave verbal to restart dopamine at .  Will continue to assess.  Overall patient is stable, VSS, neuro intact

## 2013-02-12 NOTE — Progress Notes (Signed)
Attempted to take pt off NRB and transition to Venturi Mask, patients sats dropped on Venturi Mask, therefore NRB was reapplied, paged CCM MD, waiting for call back.

## 2013-02-12 NOTE — Progress Notes (Signed)
  Patient stated that his foley was bothering him, foley was repositioned, it was flushed, and patient still said it was bothering him. Sat patient up on the side of the bed and stood him up to see if that would help drain his catheter and relieve his pressure, patient states it helps. Upon getting patient back to bed his oxygen saturation sats dropped into the mid 80s, his oxygen was increased from 4L to 6L via nasal cannula. His stats still remained in the 80s, Patient was asked to take slow deep breaths, he did his incentive spirometer and flutter valve, he did some CDB, his sats were still in the 80s, asked RT to help, patient was placed on a Venturi Mask at 15L at 50%, his sats did not respond to that, therefore, non rebreather mask was applied, patients sats came up to upper 90s -100%. During this period, patient was not in any distress, he was fully alert, neuro intact, and all other VS were stable.

## 2013-02-12 NOTE — Progress Notes (Signed)
SUBJECTIVE:  Required increased O2 last night.  UO decreased.  No further NSVT.  Lest pain.     PHYSICAL EXAM Filed Vitals:   02/12/13 0630 02/12/13 0700 02/12/13 0730 02/12/13 0800  BP: 103/70 107/68 116/69 116/65  Pulse: 79 81 84 80  Temp:   97.6 F (36.4 C)   TempSrc:   Oral   Resp: 20 16 20 19   Height:      Weight:      SpO2: 100% 99% 98% 100%   General:  No distress. Lungs:  Basilar crackles Heart:  RRR, no rub Abdomen:  Positive bowel sounds, no rebound no guarding Extremities:  Diffuse edema   LABS:  Results for orders placed during the hospital encounter of 01/31/13 (from the past 24 hour(s))  GLUCOSE, CAPILLARY     Status: Abnormal   Collection Time    02/11/13 11:51 AM      Result Value Range   Glucose-Capillary 117 (*) 70 - 99 mg/dL  GLUCOSE, CAPILLARY     Status: Abnormal   Collection Time    02/11/13  6:06 PM      Result Value Range   Glucose-Capillary 148 (*) 70 - 99 mg/dL  GLUCOSE, CAPILLARY     Status: Abnormal   Collection Time    02/11/13 10:03 PM      Result Value Range   Glucose-Capillary 109 (*) 70 - 99 mg/dL   Comment 1 Documented in Chart     Comment 2 Notify RN    POCT I-STAT 3, BLOOD GAS (G3+)     Status: Abnormal   Collection Time    02/12/13  1:08 AM      Result Value Range   pH, Arterial 7.367  7.350 - 7.450   pCO2 arterial 57.1 (*) 35.0 - 45.0 mmHg   pO2, Arterial 70.0 (*) 80.0 - 100.0 mmHg   Bicarbonate 32.8 (*) 20.0 - 24.0 mEq/L   TCO2 34  0 - 100 mmol/L   O2 Saturation 93.0     Acid-Base Excess 6.0 (*) 0.0 - 2.0 mmol/L   Collection site RADIAL, ALLEN'S TEST ACCEPTABLE     Drawn by VENIPUNCTURE     Sample type ARTERIAL    PROTIME-INR     Status: Abnormal   Collection Time    02/12/13  4:10 AM      Result Value Range   Prothrombin Time 19.9 (*) 11.6 - 15.2 seconds   INR 1.76 (*) 0.00 - 1.49  CBC     Status: Abnormal   Collection Time    02/12/13  4:10 AM      Result Value Range   WBC 10.4  4.0 - 10.5 K/uL   RBC 3.04  (*) 4.22 - 5.81 MIL/uL   Hemoglobin 9.3 (*) 13.0 - 17.0 g/dL   HCT 16.1 (*) 09.6 - 04.5 %   MCV 94.4  78.0 - 100.0 fL   MCH 30.6  26.0 - 34.0 pg   MCHC 32.4  30.0 - 36.0 g/dL   RDW 40.9  81.1 - 91.4 %   Platelets 202  150 - 400 K/uL  BASIC METABOLIC PANEL     Status: Abnormal   Collection Time    02/12/13  4:10 AM      Result Value Range   Sodium 140  135 - 145 mEq/L   Potassium 3.8  3.5 - 5.1 mEq/L   Chloride 101  96 - 112 mEq/L   CO2 33 (*) 19 - 32 mEq/L  Glucose, Bld 116 (*) 70 - 99 mg/dL   BUN 36 (*) 6 - 23 mg/dL   Creatinine, Ser 4.09 (*) 0.50 - 1.35 mg/dL   Calcium 8.9  8.4 - 81.1 mg/dL   GFR calc non Af Amer 51 (*) >90 mL/min   GFR calc Af Amer 60 (*) >90 mL/min  GLUCOSE, CAPILLARY     Status: Abnormal   Collection Time    02/12/13  4:23 AM      Result Value Range   Glucose-Capillary 112 (*) 70 - 99 mg/dL   Comment 1 Documented in Chart     Comment 2 Notify RN    GLUCOSE, CAPILLARY     Status: Abnormal   Collection Time    02/12/13  8:02 AM      Result Value Range   Glucose-Capillary 119 (*) 70 - 99 mg/dL    Intake/Output Summary (Last 24 hours) at 02/12/13 0913 Last data filed at 02/12/13 0835  Gross per 24 hour  Intake 1956.53 ml  Output   2530 ml  Net -573.47 ml   CXR:  Vascular congestion and mild cardiomegaly; somewhat more diffuse  bilateral airspace opacification now seen, with small bilateral  pleural effusions. Findings are compatible with worsening  pulmonary edema. Given the prior appearance, underlying pneumonia  cannot be excluded.  02/12/2013   ASSESSMENT AND PLAN:  VENTRICULAR TACHYCARDIA:  No further NSVT.  Switched to oral amiodarone.  Continue low dose beta blocker.  Unable to begin med titration with ACE inhibitor yet but likely will be able to start this prior to discharge.   CARDIOMYOPATHY:  Ischemic. Agree with increased Lasix.    CABG/MVR:  Extubated yesterday.  Off low dose dopamine.   CKD:  Creat continues to improve.      Fayrene Fearing [facility] 02/12/2013 9:13 AM

## 2013-02-12 NOTE — Progress Notes (Signed)
eLink Physician-Brief Progress Note Patient Name: Lonnie [name] DOB: 15-Jul-1946 MRN: [id number]  Date of Service  02/12/2013   HPI/Events of Note  Call from nurse reporting that patient was out of bed doing fine.  Back to bed abruptly dropped sats - placed on ventimask with continued decrease in sats - now on 100% NRB with sats of 96%.  Lung fields are diminished but this is no different than prior assessment.  HD stable otherwise.  Camera check shows no resp distress.   eICU Interventions  Plan: PCXR ordered ABG Continue to monitor.   Intervention Category Intermediate Interventions: Respiratory distress - evaluation and management  Malita Ignasiak 02/12/2013, 12:56 AM

## 2013-02-12 NOTE — Progress Notes (Signed)
Per CCM, received orders for STAT ABG and STAT Chest XRAY.  Will obtain these, patient is still on NRB, sats in the upper 90s, patient is overall stable, not in distress, neuro intact

## 2013-02-12 NOTE — Progress Notes (Signed)
7 Days Post-Op Procedure(s) (LRB): INTRAOPERATIVE TRANSESOPHAGEAL ECHOCARDIOGRAM (N/A) ENDOVEIN HARVEST OF GREATER SAPHENOUS VEIN (Right) PATENT FORAMEN OVALE CLOSURE (N/A) MITRAL VALVE REPLACEMENT (MVR)/CORONARY ARTERY BYPASS GRAFTING (CABG) (N/A) Subjective:  Respiratory status worsened overnight Required BIPAP, now on NRB C/o discomfort from BIPAP  Objective: Vital signs in last 24 hours: Temp:  [97.6 F (36.4 C)-98.4 F (36.9 C)] 97.6 F (36.4 C) (06/05 0730) Pulse Rate:  [77-93] 84 (06/05 0730) Cardiac Rhythm:  [-]  Resp:  [9-22] 20 (06/05 0730) BP: (102-131)/(65-90) 116/69 mmHg (06/05 0730) SpO2:  [87 %-100 %] 98 % (06/05 0730) FiO2 (%):  [50 %-60 %] 60 % (06/05 0630)  Hemodynamic parameters for last 24 hours:    Intake/Output from previous day: 06/04 0701 - 06/05 0700 In: 2210.8 [P.O.:1080; I.V.:880.8; IV Piggyback:250] Out: 3005 [Urine:3005] Intake/Output this shift:    General appearance: alert and no distress Neurologic: intact Heart: regular rate and rhythm Lungs: diminished breath sounds bibasilar and rales bilaterally Abdomen: normal findings: +BS, nontender  Lab Results:  Recent Labs  02/11/13 0451 02/12/13 0410  WBC 12.7* 10.4  HGB 9.6* 9.3*  HCT 29.1* 28.7*  PLT 187 202   BMET:  Recent Labs  02/11/13 0451 02/12/13 0410  NA 140 140  K 3.8 3.8  CL 101 101  CO2 29 33*  GLUCOSE 135* 116*  BUN 48* 36*  CREATININE 1.68* 1.38*  CALCIUM 8.8 8.9    PT/INR:  Recent Labs  02/12/13 0410  LABPROT 19.9*  INR 1.76*   ABG    Component Value Date/Time   PHART 7.367 02/12/2013 0108   HCO3 32.8* 02/12/2013 0108   TCO2 34 02/12/2013 0108   ACIDBASEDEF 2.0 02/08/2013 1738   O2SAT 93.0 02/12/2013 0108   CBG (last 3)   Recent Labs  02/11/13 1806 02/11/13 2203 02/12/13 0423  GLUCAP 148* 109* 112*    Assessment/Plan: S/P Procedure(s) (LRB): INTRAOPERATIVE TRANSESOPHAGEAL ECHOCARDIOGRAM (N/A) ENDOVEIN HARVEST OF GREATER SAPHENOUS VEIN  (Right) PATENT FORAMEN OVALE CLOSURE (N/A) MITRAL VALVE REPLACEMENT (MVR)/CORONARY ARTERY BYPASS GRAFTING (CABG) (N/A) - CV- stable, started back on dopamine last PM for no clear reason- wean dopamine  Convert amiodarone to PO  Continue coumadin- change to PO  RESP- increasing O2 requirement- CXR shows pulmonary edema, needs additional diuresis  Wean O2 as tolerated  RENAL- ARF resolving- creatinine nearly all the way back to normal  Volume overloaded - diurese  Will give lasix 40 Q8 today  Supplement K  ENDO- CBG well controlled  GI- ileus- has BS but still distended, will give reglan, dulcolax suppository  Continue ambulation when respiratory status improves   LOS: 12 days    Bina Veenstra C 02/12/2013

## 2013-02-12 NOTE — Progress Notes (Signed)
CXR results and ABGs results were in, Paged Dr. Laneta Simmers.  Read him the results for the Chest XRay and ABG, received orders to start BIPAP as patient can tolerate it.  Will have RT start patient on BiPAP.    During this entire process, patient was kept informed, all questions were answered, patient was stable, VVS stable, neuro intact. Patient denied any distress, denied chest pain, denied SOB.  Will continue to monitor patient.

## 2013-02-13 ENCOUNTER — Inpatient Hospital Stay (HOSPITAL_COMMUNITY): Payer: Managed Care, Other (non HMO)

## 2013-02-13 LAB — BASIC METABOLIC PANEL
CO2: 37 mEq/L — ABNORMAL HIGH (ref 19–32)
Chloride: 98 mEq/L (ref 96–112)
Creatinine, Ser: 1.4 mg/dL — ABNORMAL HIGH (ref 0.50–1.35)
Potassium: 3.6 mEq/L (ref 3.5–5.1)
Sodium: 142 mEq/L (ref 135–145)

## 2013-02-13 LAB — PROTIME-INR: INR: 2.35 — ABNORMAL HIGH (ref 0.00–1.49)

## 2013-02-13 LAB — CBC
HCT: 28.4 % — ABNORMAL LOW (ref 39.0–52.0)
Hemoglobin: 9.1 g/dL — ABNORMAL LOW (ref 13.0–17.0)
MCV: 95.3 fL (ref 78.0–100.0)
RBC: 2.98 MIL/uL — ABNORMAL LOW (ref 4.22–5.81)
WBC: 11.5 10*3/uL — ABNORMAL HIGH (ref 4.0–10.5)

## 2013-02-13 MED ORDER — POTASSIUM CHLORIDE 10 MEQ/50ML IV SOLN
INTRAVENOUS | Status: AC
  Filled 2013-02-13: qty 100

## 2013-02-13 MED ORDER — GUAIFENESIN-DM 100-10 MG/5ML PO SYRP: 15.0000 mL | ORAL_SOLUTION | ORAL | Status: DC | PRN

## 2013-02-13 MED ORDER — FLEET ENEMA 7-19 GM/118ML RE ENEM
1.0000 | ENEMA | Freq: Every day | RECTAL | Status: DC | PRN
  Filled 2013-02-13: qty 1

## 2013-02-13 MED ORDER — CARVEDILOL 3.125 MG PO TABS
3.1250 mg | ORAL_TABLET | Freq: Two times a day (BID) | ORAL | Status: DC
  Administered 2013-02-13 – 2013-02-17 (×9): 3.125 mg via ORAL
  Filled 2013-02-13 (×12): qty 1

## 2013-02-13 MED ORDER — MOVING RIGHT ALONG BOOK
Freq: Once | Status: DC
  Filled 2013-02-13: qty 1

## 2013-02-13 MED ORDER — SODIUM CHLORIDE 0.9 % IJ SOLN: 3.0000 mL | INTRAMUSCULAR | Status: DC | PRN

## 2013-02-13 MED ORDER — SODIUM CHLORIDE 0.9 % IJ SOLN
3.0000 mL | Freq: Two times a day (BID) | INTRAMUSCULAR | Status: DC
  Administered 2013-02-13 – 2013-02-16 (×3): 3 mL via INTRAVENOUS

## 2013-02-13 MED ORDER — LACTULOSE 10 GM/15ML PO SOLN
30.0000 g | Freq: Once | ORAL | Status: AC
  Administered 2013-02-13: 30 g via ORAL
  Filled 2013-02-13: qty 45

## 2013-02-13 MED ORDER — MAGNESIUM HYDROXIDE 400 MG/5ML PO SUSP: 30.0000 mL | Freq: Every day | ORAL | Status: DC | PRN

## 2013-02-13 MED ORDER — ALUM & MAG HYDROXIDE-SIMETH 200-200-20 MG/5ML PO SUSP: 15.0000 mL | ORAL | Status: DC | PRN

## 2013-02-13 MED ORDER — POTASSIUM CHLORIDE 10 MEQ/50ML IV SOLN
10.0000 meq | INTRAVENOUS | Status: AC
  Administered 2013-02-13 (×3): 10 meq via INTRAVENOUS
  Filled 2013-02-13: qty 50

## 2013-02-13 MED ORDER — POTASSIUM CHLORIDE 10 MEQ/50ML IV SOLN: 10.0000 meq | INTRAVENOUS | Status: DC

## 2013-02-13 MED ORDER — SODIUM CHLORIDE 0.9 % IV SOLN: 250.0000 mL | INTRAVENOUS | Status: DC | PRN

## 2013-02-13 MED ORDER — FUROSEMIDE 40 MG PO TABS
40.0000 mg | ORAL_TABLET | Freq: Two times a day (BID) | ORAL | Status: DC
  Administered 2013-02-13 – 2013-02-16 (×7): 40 mg via ORAL
  Filled 2013-02-13 (×9): qty 1

## 2013-02-13 NOTE — Progress Notes (Signed)
Physical Therapy Treatment Patient Details Name: Lonnie [name] MRN: [id number] DOB: 05/09/1946 Today's Date: 02/13/2013 Time: 4098-1191 PT Time Calculation (min): 15 min  PT Assessment / Plan / Recommendation Comments on Treatment Session  Pt demonstrate significant progress towards PT goals at this time. ambulated with no physical assist required. Will continue to progress activity as tolerated.    Follow Up Recommendations  Home health PT;Supervision/Assistance - 24 hour     Does the patient have the potential to tolerate intense rehabilitation     Barriers to Discharge        Equipment Recommendations  Rolling walker with 5" wheels;Other (comment) (3N1)    Recommendations for Other Services    Frequency Min 3X/week   Plan Discharge plan remains appropriate    Precautions / Restrictions Precautions Precautions: Fall;Sternal Restrictions Weight Bearing Restrictions: No   Pertinent Vitals/Pain No pain    Mobility  Bed Mobility Bed Mobility: Not assessed Transfers Transfers: Sit to Stand;Stand to Sit Sit to Stand: 4: Min assist Stand to Sit: 4: Min guard Details for Transfer Assistance: cues for precautions Ambulation/Gait Ambulation/Gait Assistance: 5: Supervision (+1 for lines and chair follow) Ambulation Distance (Feet): 360 Feet Assistive device: Other (Comment) (pushing wheelchair) Ambulation/Gait Assistance Details: Patient ambulated on 4 liters with good control and stability. Gait Pattern: Step-through pattern;Decreased stride length;Wide base of support;Trunk flexed Gait velocity: decreased Stairs: No Wheelchair Mobility Wheelchair Mobility: No      PT Goals Acute Rehab PT Goals PT Goal Formulation: With patient Time For Goal Achievement: 02/18/13 Potential to Achieve Goals: Good Pt will go Supine/Side to Sit: Independently Pt will go Sit to Stand: Independently PT Goal: Sit to Stand - Progress: Progressing toward goal Pt will Transfer Bed to  Chair/Chair to Bed: Independently PT Transfer Goal: Bed to Chair/Chair to Bed - Progress: Progressing toward goal Pt will Ambulate: >150 feet;with modified independence;with least restrictive assistive device PT Goal: Ambulate - Progress: Progressing toward goal Pt will Go Up / Down Stairs: 1-2 stairs;with modified independence;with least restrictive assistive device  Visit Information  Last PT Received On: 02/13/13 Assistance Needed: +2 (for line management)    Subjective Data  Subjective: I'll go walk before I eat my lunch Patient Stated Goal: To go home   Cognition  Cognition Arousal/Alertness: Awake/alert Behavior During Therapy: WFL for tasks assessed/performed Overall Cognitive Status: Within Functional Limits for tasks assessed    Balance  Static Standing Balance Static Standing - Balance Support: Bilateral upper extremity supported;During functional activity Static Standing - Level of Assistance: 4: Min assist Static Standing - Comment/# of Minutes: 2 minutes rest break x 2  End of Session PT - End of Session Equipment Utilized During Treatment: Gait belt;Oxygen Activity Tolerance: Patient tolerated treatment well;Other (comment) (session limited as patient to eat lunch) Patient left: in chair;with call bell/phone within reach;with family/visitor present Nurse Communication: Mobility status   GP     Fabio Asa 02/13/2013, 3:42 PM Charlotte Crumb, PT DPT  [phone]

## 2013-02-13 NOTE — Progress Notes (Signed)
8 Days Post-Op Procedure(s) (LRB): INTRAOPERATIVE TRANSESOPHAGEAL ECHOCARDIOGRAM (N/A) ENDOVEIN HARVEST OF GREATER SAPHENOUS VEIN (Right) PATENT FORAMEN OVALE CLOSURE (N/A) MITRAL VALVE REPLACEMENT (MVR)/CORONARY ARTERY BYPASS GRAFTING (CABG) (N/A) Subjective: Feels much better today Still no BM  Objective: Vital signs in last 24 hours: Temp:  [98.3 F (36.8 C)-98.5 F (36.9 C)] 98.3 F (36.8 C) (06/06 0400) Pulse Rate:  [62-96] 82 (06/06 0700) Cardiac Rhythm:  [-] Normal sinus rhythm (06/06 0600) Resp:  [12-26] 21 (06/06 0700) BP: (93-130)/(58-107) 93/66 mmHg (06/06 0700) SpO2:  [91 %-100 %] 93 % (06/06 0700) Weight:  [223 lb (101.152 kg)] 223 lb (101.152 kg) (06/06 0500)  Hemodynamic parameters for last 24 hours:    Intake/Output from previous day: 06/05 0701 - 06/06 0700 In: 1965.2 [P.O.:1060; I.V.:655.2; IV Piggyback:250] Out: 3475 [Urine:3475] Intake/Output this shift:    General appearance: alert and no distress Neurologic: intact Heart: regular rate and rhythm Lungs: rales bibasilar Abdomen: soft, non-tender; bowel sounds normal; no masses,  no organomegaly and mildly distended Wound: clean and dry  Lab Results:  Recent Labs  02/12/13 0410 02/13/13 0420  WBC 10.4 11.5*  HGB 9.3* 9.1*  HCT 28.7* 28.4*  PLT 202 229   BMET:  Recent Labs  02/12/13 0410 02/13/13 0420  NA 140 142  K 3.8 3.6  CL 101 98  CO2 33* 37*  GLUCOSE 116* 115*  BUN 36* 29*  CREATININE 1.38* 1.40*  CALCIUM 8.9 8.9    PT/INR:  Recent Labs  02/13/13 0420  LABPROT 24.7*  INR 2.35*   ABG    Component Value Date/Time   PHART 7.367 02/12/2013 0108   HCO3 32.8* 02/12/2013 0108   TCO2 34 02/12/2013 0108   ACIDBASEDEF 2.0 02/08/2013 1738   O2SAT 93.0 02/12/2013 0108   CBG (last 3)   Recent Labs  02/12/13 1200 02/12/13 1730 02/12/13 2115  GLUCAP 129* 96 115*    Assessment/Plan: S/P Procedure(s) (LRB): INTRAOPERATIVE TRANSESOPHAGEAL ECHOCARDIOGRAM (N/A) ENDOVEIN HARVEST OF  GREATER SAPHENOUS VEIN (Right) PATENT FORAMEN OVALE CLOSURE (N/A) MITRAL VALVE REPLACEMENT (MVR)/CORONARY ARTERY BYPASS GRAFTING (CABG) (N/A) Plan for transfer to step-down: see transfer orders CV- s/p CABG MVR for 3 vessel CAD, post MI, MR due to ruptured papillary muscle  BP a little on low side after diuresis  CHF improved  INR up to 2.35- hold coumadin today  VT- on amiodarone, rhythm stable  RESP- pulmonary edema improved  RENAL- resolving ATN- creatinine stable, will change to PO lasix and dc foley  ENDO- CBG well controlled, not getting any insulin, dc CBG  GI- ileus persists- lactulose, fleet's, will try regular food today  Transfer to PTCU if bed available   LOS: 13 days    HENDRICKSON,STEVEN C 02/13/2013

## 2013-02-13 NOTE — Progress Notes (Signed)
OT Cancellation Note  Patient Details Name: Lonnie [name] MRN: [id number] DOB: 08/23/1946   Cancelled Treatment:    Reason Eval/Treat Not Completed: Other (comment) (nursing requested for pt to rest)  [facility], OTR/L  [phone] 02/13/2013 02/13/2013, 2:40 PM

## 2013-02-13 NOTE — Op Note (Signed)
NAMEMarland Kitchen  [name], [name] NO.:  [id number]  MEDICAL RECORD NO.:  [id number]  LOCATION:  2309                         FACILITY:  [facility]  PHYSICIAN:  Salvatore Decent. Dorris Fetch, M.D.DATE OF BIRTH:  [birth year]  DATE OF PROCEDURE:  02/05/2013 DATE OF DISCHARGE:                              OPERATIVE REPORT   PREOPERATIVE DIAGNOSIS:  Severe 2-vessel coronary disease status post myocardial infarction, severe mitral regurgitation.  POSTOPERATIVE DIAGNOSIS:  Severe 2-vessel coronary disease status post myocardial infarction, severe mitral regurgitation with mitral regurgitation secondary to ruptured papillary muscle.  PROCEDURE:  Median sternotomy, extracorporeal circulation, coronary artery bypass grafting x3 (left internal mammary artery to left anterior descending, saphenous vein graft to first diagonal, saphenous vein graft to posterior descending), attempted mitral valve repair with 32 mm Memo 3D annuloplasty ring and repair of papillary muscle.  Closure of patent foramen ovale, mitral valve replacement with [facility] pericardial valve 33 mm, model #7300 TFX, serial number [id number], endoscopic vein harvest right thigh.  SURGEON:  Salvatore Decent. Dorris Fetch, MD  ASSISTANT:  Coral Ceo, PA  ANESTHESIA:  General.  FINDINGS:  Transesophageal echocardiography prebypass showed inferior hypokinesis and severe mitral regurgitation with a flail segment likely posterior leaflet.  INTRAOPERATIVE FINDINGS:  Good targets and good conduits, flail anterior and posterior segments A3 and P3 due to rupture of the tip of the papillary muscle, repair failed due to sutures pulling through the papillary muscle, valve replaced.  Postbypass transesophageal echocardiography revealed good function of the prosthetic valve with no perivalvular leaks.  CLINICAL NOTE:  Lonnie [name] is a 67 year old gentleman, who presented after an out of hospital myocardial infarction.  He had 4-day history  of severe dyspnea, orthopnea, and paroxysmal nocturnal dyspnea.  He was found to be in congestive heart failure and had an elevated troponin on admission.  He also had pulmonary edema.  He was initially treated medically.  An echocardiogram showed mitral valve prolapse but only mild MR.  At catheterization, he had severe 3-vessel disease and had a large V-wave and severe MR.  A TEE showed that there was, in fact, severe mitral regurgitation and a flail portion of what was thought to be the posterior leaflet.  The patient was advised to undergo coronary bypass grafting and mitral valve repair and possible replacement.  The indications, risks, benefits, and alternatives were discussed in detail with the patient.  He understood and accepted the risks and agreed to proceed.  OPERATIVE NOTE:  Lonnie [name] was brought to the preoperative holding area on Feb 05, 2013.  There Anesthesia placed a Swan-Ganz catheter and an arterial blood pressure monitoring line.  Intravenous antibiotics were administered.  He was taken to the operating room, anesthetized, and intubated.  A Foley catheter was placed.  The chest, abdomen, and legs were prepped and draped in usual sterile fashion.  Transesophageal echocardiography was performed.  It showed severe mitral regurgitation. There was severe inferior hypokinesis.  A median sternotomy was performed and the left internal mammary artery was harvested using standard technique.  Simultaneously, an incision was made in the medial aspect of the right leg at the level of the knee and greater saphenous vein was harvested from the right  thigh endoscopically.  Heparin 2000 units was administered during the vessel harvest.  The remainder of the heparin was given prior to opening the pericardium.  After the conduits had been harvested, the pericardium was opened. There was marked cardiomegaly.  The aorta was normal.  After confirming adequate anticoagulation with ACT  measurement, the aorta was cannulated via concentric 2-0 Ethibond pledgeted pursestring sutures.  A single stage 28-French venous cannula was placed via pursestring suture in the right atrial appendage.  Cardiopulmonary bypass was commenced.  A 2nd pursestring suture was placed at the base of the right atrium above the inferior vena cava and a 36-French right-angle cannula was placed via this pursestring suture and directed into the inferior vena cava and connected to the bypass circuit.  Flows were maintained per protocol. The 28-French cannula in the right atrial appendage was redirected into the superior vena cava.  Caval tapes were placed but were only tightened during the open portion of the procedure.  Carbon dioxide was insufflated into the operative field.  The coronary arteries were inspected and anastomotic sites were chosen.  The conduits were inspected and cut to length.  A foam pad was placed in the pericardium to insulate the heart and protect left phrenic nerve.  A temperature probe was placed in myocardial septum.  A retrograde cardioplegic cannula was placed via pursestring suture in the right atrium and directed into the coronary sinus.  An antegrade cardioplegic cannula was placed in the ascending aorta.  The aorta was crossclamped.  The left ventricle was emptied via the aortic root vent.  Cardiac arrest then was achieved with combination of cold antegrade and retrograde blood cardioplegia and topical iced saline after achieving a diastolic arrest and adequate myocardial septal cooling, following distal anastomoses were performed.  First, a reversed saphenous vein graft was placed end-to-side to the posterior descending branch of the right coronary.  Both the vein and target were of good quality.  The anastomosis was performed with a running 7 Prolene suture.  All anastomoses were probed proximally and distally at their completion to ensure patency before tying  sutures. Cardioplegia was administered down each vein graft at the completion to assess flow and hemostasis.  Next, a reversed saphenous vein graft was placed end-to-side to the first diagonal branch to the LAD.  This was likewise a good quality target and again the vein was of good quality and again an end-to-side anastomosis was performed with a running 7 Prolene suture.  After administering additional cardioplegia, the left internal mammary artery was brought through a window in the pericardium.  The distal end was beveled.  It was then anastomosed end-to-side to the distal LAD.  Both mammary and LAD were good quality vessels.  This anastomosis was performed with a running 8-0 Prolene suture.  At the completion of the mammary to LAD anastomosis, the bulldog clamp was briefly removed to inspect for hemostasis.  Septal rewarming was noted.  The bulldog clamp was replaced.  The mammary pedicle was tacked to the epicardial surface of the heart with 6-0 Prolene sutures.  Additional cardioplegia was administered retrograde.  The caval tapes were tightened.  Right atriotomy was performed.  The patent foramen ovale was identified.  The interatrial septum was opened initially through the membranous septum and extended to allow exposure of the mitral valve.  With inspection of mitral valve, it was clear that there was a ruptured tip of the posterior medial papillary muscle with the posterior commissure both anterior and posterior A3  and P3 segments prolapsed.  The leaflets otherwise appeared relatively normal and it was felt that this could potentially be repaired by reattaching the papillary muscle.  The papillary muscle was entangled in the cords and this was disentangled as much as possible.  A 2-0 Ethibond sutures with pledgets were placed on the posterior medial papillary muscle.  These were then brought through the fibrous tendon of the papillary muscle that was attached to the  chordae.  Again pledget was placed.  Two of the sutures were placed.  The left ventricle was distended with iced saline and there was no residual regurgitation.  A Sorin memo 3D ring was selected to reinforce the repair.  The anterior leaflet sized for a 32 mm ring.  The annular sutures were placed.  These were 2-0 Ethibond horizontal mattress sutures.  They were placed circumferentially around the anulus.  They were then brought through the cuff of the annuloplasty ring, which was lowered into place and the sutures were then secured with the Cor-knot device.  Again the left ventricle was distended with iced saline and there was no residual mitral regurgitation.  Rewarming was begun.  It should be noted that additional cardioplegia was administered at 15-20 minute intervals throughout the open portion of the procedure.  The atrial septum was closed with a double layer of 4-0 Prolene running sutures.  The 1st was a running horizontal mattress suture followed by running simple suture.  De-airing was performed before tying the sutures.  The patent foramen ovale was closed as the atrial septum was repaired.  The right atriotomy was closed in a similar fashion. The caval tapes were released.  The proximal vein graft anastomoses then were performed to 4.5 mm punch aortotomies with running 6-0 Prolene sutures.  At completion of the proximal anastomosis before tying the final suture, the patient was placed in Trendelenburg position.  Lidocaine was administered.  The bulldog clamp was removed from the left mammary artery.  Septal rewarming was noted.  Extensive de-airing maneuvers were performed and the aortic crossclamp was removed.  The patient was started on dopamine infusion and also was loaded with milrinone and then milrinone infusion was initiated as well.  All proximal and distal anastomoses and the atriotomy were inspected for hemostasis.  When the patient reached a core temperature of 37  degrees Celsius, an attempt was made to wean from cardiopulmonary bypass.  Bypass flows were diminished and the arterial pressures increased, but before the patient had completely weaned from bypass, transesophageal echocardiography showed there was again a flail segment at the posterior commissure with the papillary muscle visible.  The patient was placed back on full flow.  A new antegrade cardioplegic cannula was placed.  The patient was once again cooled and the aorta was once again crossclamped.  Cardiac arrest was again achieved with antegrade and retrograde blood cardioplegia and topical iced saline.  A bulldog clamp was placed on the mammary artery. The right atrium was reopened as was the interatrial septum.  It was clear that the sutures had pulled through the papillary muscle.  Sutures and pledgets were still attached to the chordae and the attached fragment of papillary muscle.  Additional attempts were made to repair this valve again with resuspension of the chordae tendineae including an attempt to secure them to the anterior papillary muscle, however, all of these attempts failed with sutures pulling through on the cardiac side and decision was made to replace the valve.  It sized for a 33 mm  Edwards Magna pericardial tissue valve.  It should be noted that once again during the 2nd cross clamp in open portion of the procedure, the caval tapes were tightened and CO2 was insufflated into the field during this portion of the procedure as well.  Cardioplegia was also given at 15-20 minute intervals throughout this portion.  The annuloplasty ring was removed.  The anterior leaflet was excised. The posterior leaflet was preserved as were the anterolateral chordal attachments and what remained at the posteromedial chordal attachments. The 2-0 Ethibond horizontal mattress sutures with supraannular pledgets were placed circumferentially around the anulus.  These were then brought  through the sewing ring of the valve.  The valve was lowered into place.  The sutures were sequentially tied.  The valve seated well. There were no gaps noted with probing of the anulus.  Rewarming was once again done.  The interatrial septum and right atriotomy were closed in standard fashion as before again with de-airing at each step of the procedure.  The bulldog clamp was removed from the left mammary artery and the aortic crossclamp was removed.  Once again all suture lines were inspected for hemostasis and when the patient had rewarmed for a core temperature of 37 degrees Celsius, he was weaned from cardiopulmonary bypass.  He was DDD paced at 90 beats per minute and on dopamine at 5 mcg/kg per minute and milrinone at 0.3 mcg/kg per minute.  Postbypass transesophageal echocardiography showed no significant change in overall left ventricular wall motion.  There was still inferior hypokinesis.  There was good function of the prosthetic valve with no perivalvular leaks.  The patient remained hemodynamically stable throughout the postbypass period.  A test dose of protamine was administered and was well tolerated.  The atrial and aortic cannulae were removed.  The remainder of the protamine was administered without incident.  Chest was irrigated with warm saline.  Hemostasis was achieved.  The pericardium cannot be reapproximated.  The left pleural and 2 mediastinal chest tubes were placed to separate subcostal incisions.  The sternum was closed with interrupted heavy gauge stainless steel wires.  The pectoralis fascia, subcutaneous tissue, and skin were closed in standard fashion.  All sponge, needle, and instrument counts were correct at end of the procedure.  The patient was taken from the operating room to the surgical intensive care unit in good condition.     Salvatore Decent Dorris Fetch, M.D.     SCH/MEDQ  D:  02/12/2013  T:  02/13/2013  Job:  [id number]

## 2013-02-13 NOTE — Progress Notes (Signed)
NUTRITION FOLLOW UP  Intervention:    Editor, commissioning daily (250 kcals, 9 gm protein per 8 fl oz carton) RD to follow for nutrition care plan  Nutrition Dx:   Increased nutrient needs related to post-op healing as evidenced by estimated nutrition needs, ongoing  Goal:   Oral intake with meals & supplements to meet >/= 90% of estimated nutrition needs, progressing  Monitor:   PO & supplemental intake, weight, labs, I/O's  Assessment:   Patient with no prior cardiac history presented to [facility] with a 4-day history of dyspnea on exertion and orthopnea; underwent cardiac cath which revealed severe 2 vessel CAD.   Patient s/p procedures 5/29:  ENDOVEIN HARVEST OF GREATER SAPHENOUS VEIN  PATENT FORAMEN OVALE CLOSURE  MITRAL VALVE REPLACEMENT  CORONARY ARTERY BYPASS GRAFTING   Patient advanced to Heart Healthy diet 6/5.  Per patient's wife, ate well at breakfast this AM.  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  Height: Ht Readings from Last 1 Encounters:  01/31/13 6\' 1"  (1.854 m)    Weight Status:   Wt Readings from Last 1 Encounters:  02/13/13 223 lb (101.152 kg)    Re-estimated needs:  Kcal: 2200-2300 Protein: 120-130 gm Fluid: 2.2-2.3 L  Skin: sternum & leg surgical incisions   Diet Order: Cardiac   Intake/Output Summary (Last 24 hours) at 02/13/13 1159 Last data filed at 02/13/13 1100  Gross per 24 hour  Intake 2160.4 ml  Output   2635 ml  Net -474.6 ml    Labs:   Recent Labs Lab 02/08/13 1645 02/08/13 1743 02/09/13 0336  02/10/13 0126 02/11/13 0451 02/12/13 0410 02/13/13 0420  NA  --  136 133*  < > 136 140 140 142  K  --  3.5 3.4*  < > 3.9 3.8 3.8 3.6  CL  --  101 96  < > 97 101 101 98  CO2  --   --  25  < > 27 29 33* 37*  BUN  --  53* 62*  < > 54* 48* 36* 29*  CREATININE 3.11* 2.60* 2.72*  < > 2.14* 1.68* 1.38* 1.40*  CALCIUM  --   --  8.3*  < > 8.7 8.8 8.9 8.9  MG 2.8*  --  2.7*  --  2.3  --   --   --   PHOS  --    --  5.3*  --   --   --   --   --   GLUCOSE  --  113* 89  < > 87 135* 116* 115*  < > = values in this interval not displayed.  CBG (last 3)   Recent Labs  02/12/13 1200 02/12/13 1730 02/12/13 2115  GLUCAP 129* 96 115*    Scheduled Meds: . amiodarone  400 mg Oral BID  . aspirin EC  81 mg Oral Daily  . atorvastatin  80 mg Oral q1800  . bisacodyl  10 mg Oral Daily   Or  . bisacodyl  10 mg Rectal Daily  . carvedilol  3.125 mg Oral BID WC  . cefTRIAXone (ROCEPHIN)  IV  2 g Intravenous Q24H  . docusate sodium  200 mg Oral Daily  . feeding supplement  1 Container Oral Q1500  . furosemide  40 mg Oral BID  . pantoprazole  40 mg Oral Q1200  . sodium chloride  3 mL Intravenous Q12H  . Warfarin - Physician Dosing Inpatient   Does not apply q1800    Continuous  Infusions: . sodium chloride 20 mL/hr (02/12/13 0900)  . sodium chloride Stopped (02/12/13 0900)  . sodium chloride 250 mL (02/08/13 2357)  . DOPamine Stopped (02/12/13 1300)  . lactated ringers Stopped (02/07/13 0000)    Maureen Chatters, RD, LDN Pager #: [phone] After-Hours Pager #: [phone]

## 2013-02-14 ENCOUNTER — Inpatient Hospital Stay (HOSPITAL_COMMUNITY): Payer: Managed Care, Other (non HMO)

## 2013-02-14 LAB — BASIC METABOLIC PANEL
BUN: 31 mg/dL — ABNORMAL HIGH (ref 6–23)
CO2: 31 mEq/L (ref 19–32)
Chloride: 96 mEq/L (ref 96–112)
Glucose, Bld: 108 mg/dL — ABNORMAL HIGH (ref 70–99)
Potassium: 3.8 mEq/L (ref 3.5–5.1)
Sodium: 140 mEq/L (ref 135–145)

## 2013-02-14 LAB — CBC
HCT: 29.7 % — ABNORMAL LOW (ref 39.0–52.0)
Hemoglobin: 9.6 g/dL — ABNORMAL LOW (ref 13.0–17.0)
RBC: 3.17 MIL/uL — ABNORMAL LOW (ref 4.22–5.81)

## 2013-02-14 LAB — PROTIME-INR
INR: 1.81 — ABNORMAL HIGH (ref 0.00–1.49)
Prothrombin Time: 20.3 seconds — ABNORMAL HIGH (ref 11.6–15.2)

## 2013-02-14 MED ORDER — WARFARIN SODIUM 2 MG PO TABS
2.0000 mg | ORAL_TABLET | Freq: Once | ORAL | Status: AC
  Administered 2013-02-14: 2 mg via ORAL
  Filled 2013-02-14: qty 1

## 2013-02-14 NOTE — Progress Notes (Addendum)
Pt ambulated in hallway 350 ft with rolling walker and 3 liters of Oxygen. Pt tolerated activity well. Will continue to monitor.  Annie Sable

## 2013-02-14 NOTE — Progress Notes (Signed)
CARDIAC REHAB PHASE I   PRE:  Rate/Rhythm: 82  BP:  Supine:   Sitting: 106/64  Standing:    SaO2: 89 RA  MODE:  Ambulation: 500 ft   POST:  Rate/Rhythem: 95  BP:  Supine:   Sitting: 108/56  Standing:    SaO2: 95 12;46 1:15 PM  Patient ambulated with assist x 1 using rolling walker.  Oxygen saturation prior to walk was 89% on room air.  We did use portable oxygen for ambulation.  Pace good, gait steady with walker.  Rest breaks x 2.   Returned to room to bedside chair.  Encouraged to use ICS and to ambulate again later today.  Wife with patient.   Lonnie [name]

## 2013-02-14 NOTE — Progress Notes (Addendum)
Pt ambulated in hallway 550 ft on 3 liters of oxygen and tolerated activity well. Pt took 2 standing rests. Will continue to monitor.  Annie Sable

## 2013-02-14 NOTE — Progress Notes (Addendum)
[address]       [place] [address]             [phone]      9 Days Post-Op  Procedure(s) (LRB): INTRAOPERATIVE TRANSESOPHAGEAL ECHOCARDIOGRAM (N/A) ENDOVEIN HARVEST OF GREATER SAPHENOUS VEIN (Right) PATENT FORAMEN OVALE CLOSURE (N/A) MITRAL VALVE REPLACEMENT (MVR)/CORONARY ARTERY BYPASS GRAFTING (CABG) (N/A) Subjective Feels much stronger   Objective  Telemetry sinus rhythm   Temp:  [98 F (36.7 C)-99.3 F (37.4 C)] 99.1 F (37.3 C) (06/07 0508) Pulse Rate:  [77-96] 82 (06/07 0508) Resp:  [15-21] 19 (06/07 0508) BP: (87-127)/(59-76) 98/59 mmHg (06/07 0508) SpO2:  [93 %-97 %] 93 % (06/07 0508) Weight:  [226 lb (102.513 kg)] 226 lb (102.513 kg) (06/07 0508)   Intake/Output Summary (Last 24 hours) at 02/14/13 0803 Last data filed at 02/14/13 0500  Gross per 24 hour  Intake    920 ml  Output   1370 ml  Net   -450 ml       General appearance: alert, cooperative and no distress Heart: regular rate and rhythm Lungs: dim in lower fields Abdomen: + BS, soft, nontender, mod distension Extremities: + BLE edema Wound: incisions healing well  Lab Results:  Recent Labs  02/13/13 0420 02/14/13 0455  NA 142 140  K 3.6 3.8  CL 98 96  CO2 37* 31  GLUCOSE 115* 108*  BUN 29* 31*  CREATININE 1.40* 1.46*  CALCIUM 8.9 8.8   No results found for this basename: AST, ALT, ALKPHOS, BILITOT, PROT, ALBUMIN,  in the last 72 hours No results found for this basename: LIPASE, AMYLASE,  in the last 72 hours  Recent Labs  02/13/13 0420 02/14/13 0455  WBC 11.5* 11.8*  HGB 9.1* 9.6*  HCT 28.4* 29.7*  MCV 95.3 93.7  PLT 229 291   No results found for this basename: CKTOTAL, CKMB, TROPONINI,  in the last 72 hours No components found with this basename: POCBNP,  No results found for this basename: DDIMER,  in the last 72 hours No results found for this basename: HGBA1C,  in the last 72 hours No results found for this basename: CHOL, HDL,  LDLCALC, TRIG, CHOLHDL,  in the last 72 hours No results found for this basename: TSH, T4TOTAL, FREET3, T3FREE, THYROIDAB,  in the last 72 hours No results found for this basename: VITAMINB12, FOLATE, FERRITIN, TIBC, IRON, RETICCTPCT,  in the last 72 hours  Medications: Scheduled . amiodarone  400 mg Oral BID  . aspirin EC  81 mg Oral Daily  . atorvastatin  80 mg Oral q1800  . bisacodyl  10 mg Oral Daily   Or  . bisacodyl  10 mg Rectal Daily  . carvedilol  3.125 mg Oral BID WC  . cefTRIAXone (ROCEPHIN)  IV  2 g Intravenous Q24H  . docusate sodium  200 mg Oral Daily  . feeding supplement  1 Container Oral Q1500  . furosemide  40 mg Oral BID  . moving right along book   Does not apply Once  . pantoprazole  40 mg Oral Q1200  . sodium chloride  3 mL Intravenous Q12H  . Warfarin - Physician Dosing Inpatient   Does not apply q1800     Radiology/Studies:  Dg Chest Port 1 View  02/13/2013   *RADIOLOGY REPORT*  Clinical Data: Mitral valve replacement.  Coronary artery bypass.  PORTABLE CHEST - 1 VIEW  Comparison: 02/12/2013.  Findings: Right central line tip proximal superior vena  cava level.  Post valve replacement and CABG.  Cardiomegaly.  Minimal improvement in pulmonary edema.  Difficult to exclude patchy infiltrate.  No gross pneumothorax.  Minimally tortuous aorta.  IMPRESSION: Minimal improvement in degree of pulmonary edema.   Original Report Authenticated By: Lacy Duverney, M.D.    INR:1.81 Will add last result for INR, ABG once components are confirmed Will add last 4 CBG results once components are confirmed  Assessment/Plan: S/P Procedure(s) (LRB): INTRAOPERATIVE TRANSESOPHAGEAL ECHOCARDIOGRAM (N/A) ENDOVEIN HARVEST OF GREATER SAPHENOUS VEIN (Right) PATENT FORAMEN OVALE CLOSURE (N/A) MITRAL VALVE REPLACEMENT (MVR)/CORONARY ARTERY BYPASS GRAFTING (CABG) (N/A)  1 ileus improved- has had 2 BM's, + Flatus 2 CXR shows some improvement in vasc congestion- cont diuresis 3  BUN/Creat- cont to observe, slightly higher 4 H/H, WBC stable 5 INR 1.81- will give 2 mg coumadin 6 rhythm stable   LOS: 14 days    GOLD,WAYNE E 6/7/20148:03 AM  Looks great patient examined and medical record reviewed,agree with above note. VAN TRIGT III,PETER 02/14/2013

## 2013-02-15 LAB — CULTURE, BLOOD (ROUTINE X 2)
Culture: NO GROWTH
Culture: NO GROWTH

## 2013-02-15 LAB — PROTIME-INR: INR: 1.76 — ABNORMAL HIGH (ref 0.00–1.49)

## 2013-02-15 MED ORDER — POTASSIUM CHLORIDE CRYS ER 20 MEQ PO TBCR
20.0000 meq | EXTENDED_RELEASE_TABLET | Freq: Every day | ORAL | Status: DC
  Administered 2013-02-15 – 2013-02-17 (×3): 20 meq via ORAL
  Filled 2013-02-15: qty 1
  Filled 2013-02-15: qty 2
  Filled 2013-02-15: qty 1

## 2013-02-15 MED ORDER — WARFARIN SODIUM 2.5 MG PO TABS
2.5000 mg | ORAL_TABLET | Freq: Once | ORAL | Status: AC
  Administered 2013-02-15: 2.5 mg via ORAL
  Filled 2013-02-15: qty 1

## 2013-02-15 MED ORDER — WARFARIN SODIUM 4 MG PO TABS
4.0000 mg | ORAL_TABLET | Freq: Once | ORAL | Status: DC
  Filled 2013-02-15: qty 1

## 2013-02-15 NOTE — Progress Notes (Signed)
Pt ambulated in hallway 550 ft with rolling walker and wife on room air. Pt tolerated activity well. Will continue to monitor.

## 2013-02-15 NOTE — Progress Notes (Signed)
Pt ambulated in hallway 550 ft with rolling walker on Room Air. Oxygen sats were 93 % on room air. Pt tolerated activity well. Will continue to monitor and encourage.

## 2013-02-15 NOTE — Progress Notes (Addendum)
[address]       [place] [address]             [phone]      10 Days Post-Op  Procedure(s) (LRB): INTRAOPERATIVE TRANSESOPHAGEAL ECHOCARDIOGRAM (N/A) ENDOVEIN HARVEST OF GREATER SAPHENOUS VEIN (Right) PATENT FORAMEN OVALE CLOSURE (N/A) MITRAL VALVE REPLACEMENT (MVR)/CORONARY ARTERY BYPASS GRAFTING (CABG) (N/A) Subjective: Feels well , not SOB  Objective  Telemetry appears junctional  Temp:  [97.6 F (36.4 C)-98.6 F (37 C)] 98 F (36.7 C) (06/08 0419) Pulse Rate:  [81-93] 93 (06/08 0419) Resp:  [18-20] 20 (06/08 0419) BP: (110-126)/(71-84) 121/84 mmHg (06/08 0419) SpO2:  [90 %-98 %] 91 % (06/08 0732) Weight:  [227 lb 4.8 oz (103.103 kg)] 227 lb 4.8 oz (103.103 kg) (06/08 0419)   Intake/Output Summary (Last 24 hours) at 02/15/13 1041 Last data filed at 02/15/13 0800  Gross per 24 hour  Intake    600 ml  Output    900 ml  Net   -300 ml       General appearance: alert, cooperative and no distress Heart: regular rate and rhythm and no murmur Lungs: dim in bases Abdomen: soft, nontender Extremities: significant edema but improving Wound: incis healing well  Lab Results:  Recent Labs  02/13/13 0420 02/14/13 0455  NA 142 140  K 3.6 3.8  CL 98 96  CO2 37* 31  GLUCOSE 115* 108*  BUN 29* 31*  CREATININE 1.40* 1.46*  CALCIUM 8.9 8.8   No results found for this basename: AST, ALT, ALKPHOS, BILITOT, PROT, ALBUMIN,  in the last 72 hours No results found for this basename: LIPASE, AMYLASE,  in the last 72 hours  Recent Labs  02/13/13 0420 02/14/13 0455  WBC 11.5* 11.8*  HGB 9.1* 9.6*  HCT 28.4* 29.7*  MCV 95.3 93.7  PLT 229 291   No results found for this basename: CKTOTAL, CKMB, TROPONINI,  in the last 72 hours No components found with this basename: POCBNP,  No results found for this basename: DDIMER,  in the last 72 hours No results found for this basename: HGBA1C,  in the last 72 hours No results found for this  basename: CHOL, HDL, LDLCALC, TRIG, CHOLHDL,  in the last 72 hours No results found for this basename: TSH, T4TOTAL, FREET3, T3FREE, THYROIDAB,  in the last 72 hours No results found for this basename: VITAMINB12, FOLATE, FERRITIN, TIBC, IRON, RETICCTPCT,  in the last 72 hours  Medications: Scheduled . amiodarone  400 mg Oral BID  . aspirin EC  81 mg Oral Daily  . atorvastatin  80 mg Oral q1800  . bisacodyl  10 mg Oral Daily   Or  . bisacodyl  10 mg Rectal Daily  . carvedilol  3.125 mg Oral BID WC  . docusate sodium  200 mg Oral Daily  . feeding supplement  1 Container Oral Q1500  . furosemide  40 mg Oral BID  . moving right along book   Does not apply Once  . pantoprazole  40 mg Oral Q1200  . sodium chloride  3 mL Intravenous Q12H  . Warfarin - Physician Dosing Inpatient   Does not apply q1800     Radiology/Studies:  Dg Chest 2 View  02/14/2013   *RADIOLOGY REPORT*  Clinical Data: CABG.  Mitral valve replacement.  Follow-up.  CHEST - 2 VIEW  Comparison: 02/13/2013.  Findings: Allowing for differences in technique (PA versus portable chest), there is little interval change in the  chest.  Median sternotomy / CABG/mitral valve replacement.  Small bilateral pleural effusions and interstitial pulmonary edema remains present. Bilateral basilar atelectasis.  Tortuous thoracic aorta.  Mild cardiomegaly.  Unchanged deformity of the left chest wall compatible with old rib fractures.  Surgical clips in the upper abdomen. Monitoring leads are projected over the chest.  IMPRESSION: Allowing for differences in technique, no interval change. Persistent cardiomegaly, interstitial and faint airspace opacity and small bilateral pleural effusions.  The constellation of findings favors residual pulmonary edema.   Original Report Authenticated By: Andreas Newport, M.D.    INR:1.76 Will add last result for INR, ABG once components are confirmed Will add last 4 CBG results once components are  confirmed  Assessment/Plan: S/P Procedure(s) (LRB): INTRAOPERATIVE TRANSESOPHAGEAL ECHOCARDIOGRAM (N/A) ENDOVEIN HARVEST OF GREATER SAPHENOUS VEIN (Right) PATENT FORAMEN OVALE CLOSURE (N/A) MITRAL VALVE REPLACEMENT (MVR)/CORONARY ARTERY BYPASS GRAFTING (CABG) (N/A)  1 not clear what rhythm is, will get 12 lead. May need to decrease amio if long QTc 2 sign volume overload, but tolerating  clinically well, cont diuresis.check pbnp 3 cont AC RX, need to d/c epw's soon 4 push rehab/ pulm toilet  LOS: 15 days    GOLD,WAYNE E 6/8/201410:41 AM  Addendum : ekg show long QTc/658 with first degree block. Will d/c amiodarone for now  Patient maintaining sinus rhythm Still with edematous ankles continue Lasix 40 twice a day, 5 to  7 pounds above baseline patient examined and medical record reviewed,agree with above note. VAN TRIGT III,Regnald Bowens 02/15/2013

## 2013-02-16 LAB — PROTIME-INR
INR: 1.49 (ref 0.00–1.49)
Prothrombin Time: 17.6 seconds — ABNORMAL HIGH (ref 11.6–15.2)

## 2013-02-16 LAB — PRO B NATRIURETIC PEPTIDE: Pro B Natriuretic peptide (BNP): 8307 pg/mL — ABNORMAL HIGH (ref 0–125)

## 2013-02-16 MED ORDER — FUROSEMIDE 10 MG/ML IJ SOLN
40.0000 mg | Freq: Two times a day (BID) | INTRAMUSCULAR | Status: DC
  Administered 2013-02-17: 40 mg via INTRAVENOUS
  Filled 2013-02-16 (×4): qty 4

## 2013-02-16 MED ORDER — WARFARIN SODIUM 2.5 MG PO TABS
2.5000 mg | ORAL_TABLET | Freq: Every day | ORAL | Status: AC
  Administered 2013-02-16: 2.5 mg via ORAL
  Filled 2013-02-16: qty 1

## 2013-02-16 MED ORDER — FUROSEMIDE 10 MG/ML IJ SOLN
40.0000 mg | Freq: Once | INTRAMUSCULAR | Status: AC
  Administered 2013-02-16: 40 mg via INTRAVENOUS

## 2013-02-16 NOTE — Progress Notes (Addendum)
11 Days Post-Op Procedure(s) (LRB): INTRAOPERATIVE TRANSESOPHAGEAL ECHOCARDIOGRAM (N/A) ENDOVEIN HARVEST OF GREATER SAPHENOUS VEIN (Right) PATENT FORAMEN OVALE CLOSURE (N/A) MITRAL VALVE REPLACEMENT (MVR)/CORONARY ARTERY BYPASS GRAFTING (CABG) (N/A) Subjective:  [name] has no complaints this morning.  He is ready to go home.   Objective: Vital signs in last 24 hours: Temp:  [98.2 F (36.8 C)-98.8 F (37.1 C)] 98.6 F (37 C) (06/09 0516) Pulse Rate:  [68-78] 78 (06/09 0758) Cardiac Rhythm:  [-] Normal sinus rhythm (06/09 0758) Resp:  [18-20] 18 (06/09 0516) BP: (90-111)/(55-74) 111/69 mmHg (06/09 0758) SpO2:  [90 %-95 %] 90 % (06/09 0516) Weight:  [230 lb 1.6 oz (104.373 kg)] 230 lb 1.6 oz (104.373 kg) (06/09 0516)  Intake/Output from previous day: 06/08 0701 - 06/09 0700 In: 840 [P.O.:840] Out: 1375 [Urine:1375] Intake/Output this shift: Total I/O In: 240 [P.O.:240] Out: -   General appearance: alert, cooperative and no distress Heart: regular rate and rhythm Lungs: diminished breath sounds bibasilar Abdomen: soft, non-tender; bowel sounds normal; no masses,  no organomegaly Extremities: edema 3+ pitting Wound: clean and dry  Lab Results:  Recent Labs  02/14/13 0455  WBC 11.8*  HGB 9.6*  HCT 29.7*  PLT 291   BMET:  Recent Labs  02/14/13 0455  NA 140  K 3.8  CL 96  CO2 31  GLUCOSE 108*  BUN 31*  CREATININE 1.46*  CALCIUM 8.8    PT/INR:  Recent Labs  02/16/13 0440  LABPROT 17.6*  INR 1.49   ABG    Component Value Date/Time   PHART 7.367 02/12/2013 0108   HCO3 32.8* 02/12/2013 0108   TCO2 34 02/12/2013 0108   ACIDBASEDEF 2.0 02/08/2013 1738   O2SAT 93.0 02/12/2013 0108   CBG (last 3)  No results found for this basename: GLUCAP,  in the last 72 hours  Assessment/Plan: S/P Procedure(s) (LRB): INTRAOPERATIVE TRANSESOPHAGEAL ECHOCARDIOGRAM (N/A) ENDOVEIN HARVEST OF GREATER SAPHENOUS VEIN (Right) PATENT FORAMEN OVALE CLOSURE (N/A) MITRAL VALVE  REPLACEMENT (MVR)/CORONARY ARTERY BYPASS GRAFTING (CABG) (N/A)  1. CV- rate and pressure on Coreg 2. Pulm- no acute issues, off oxygen 3. Renal- BNP >8000, pitting edema of LE, weight is up 16 pounds since admission, will change Lasix to IV  4. D/C EPW, on Coumadin INR trending up 5. INR 1.49 today, will give 2.5 mg coumadin 6. Dispo- patient volume overloaded, will give IV Lasix, order TED hose, d/c EPW, continue to monitor   LOS: 16 days    BARRETT, ERIN 02/16/2013  Looks good BP relatively low and creatinine still a little high- will have to start ACE-I as outpatient Dc pacing wires while INR down Plan to dc home tomorrow

## 2013-02-16 NOTE — Progress Notes (Signed)
D/C EPW per order. Pt tolerated procedure well. VSS. Pt instructed of bedrest for 1 hour. Verbalized understanding. Will continue to monitor pt closely.

## 2013-02-16 NOTE — Progress Notes (Signed)
Physical Therapy Treatment Patient Details Name: Lonnie [name] MRN: [id number] DOB: [birth year] Today's Date: 02/16/2013 Time: 1308-6578 PT Time Calculation (min): 24 min  PT Assessment / Plan / Recommendation Comments on Treatment Session  Pt demonstrates significant progress towards PT goals at this time. Ambulating increased distances without assistive device with SpO2 >90% at all times. Patient completed stair negotation without difficulty. Spoke with patient and spouse at length regarding expectations for mobility, positioning for edema control, ther-ex and home management.  Discussed HHPT follow up.      Follow Up Recommendations  Home health PT;Supervision/Assistance - 24 hour     Does the patient have the potential to tolerate intense rehabilitation     Barriers to Discharge        Equipment Recommendations  Rolling walker with 5" wheels;Other (comment) (3N1)    Recommendations for Other Services    Frequency Min 3X/week   Plan Discharge plan remains appropriate    Precautions / Restrictions Precautions Precautions: Fall;Sternal Restrictions Weight Bearing Restrictions:  (sternal precautions)   Pertinent Vitals/Pain No pain at this time    Mobility  Bed Mobility Bed Mobility: Not assessed Details for Bed Mobility Assistance: did teachback on rolling and following precautions during mobility Transfers Transfers: Sit to Stand;Stand to Sit Sit to Stand: 6: Modified independent (Device/Increase time) Stand to Sit: 6: Modified independent (Device/Increase time) Details for Transfer Assistance: Pt required increased time but is able to perform without using hands, good compliance with sternal precautions no need for VCs Ambulation/Gait Ambulation/Gait Assistance: 5: Supervision Ambulation Distance (Feet): 400 Feet Assistive device: None Ambulation/Gait Assistance Details: patient steady with ambulation; SpO2 monitored with standing rest breaks throughout (4 in total)  O2 remained >90% on rm air.  Gait Pattern: Step-through pattern;Decreased stride length;Wide base of support;Trunk flexed Gait velocity: decreased Stairs: Yes Stairs Assistance: 4: Min guard Stair Management Technique: One rail Right;Step to pattern;Forwards Number of Stairs: 6      PT Goals Acute Rehab PT Goals PT Goal Formulation: With patient Time For Goal Achievement: 02/18/13 Potential to Achieve Goals: Good Pt will go Supine/Side to Sit: Independently Pt will go Sit to Stand: Independently PT Goal: Sit to Stand - Progress: Progressing toward goal Pt will Transfer Bed to Chair/Chair to Bed: Independently PT Transfer Goal: Bed to Chair/Chair to Bed - Progress: Progressing toward goal Pt will Ambulate: >150 feet;with modified independence;with least restrictive assistive device PT Goal: Ambulate - Progress: Progressing toward goal Pt will Go Up / Down Stairs: 1-2 stairs;with modified independence;with least restrictive assistive device PT Goal: Up/Down Stairs - Progress: Progressing toward goal  Visit Information  Last PT Received On: 02/16/13 Assistance Needed: +1    Subjective Data  Subjective: I haven't seen the doctor yet, but he said maybe today maybe tomorrow Patient Stated Goal: to go home   Cognition  Cognition Arousal/Alertness: Awake/alert Behavior During Therapy: WFL for tasks assessed/performed Overall Cognitive Status: Within Functional Limits for tasks assessed    Balance  Static Standing Balance Static Standing - Balance Support: No upper extremity supported Static Standing - Level of Assistance: 5: Stand by assistance Static Standing - Comment/# of Minutes: some noted lateral sway but not loss of balance, no assistive device used at this time, total of 4 standing rest breaks each approximately one minute in duration  End of Session PT - End of Session Equipment Utilized During Treatment: Gait belt Activity Tolerance: Patient tolerated treatment  well Patient left: in chair;with call bell/phone within reach;with family/visitor present Nurse Communication:  Mobility status   GP     Fabio Asa 02/16/2013, 11:21 AM Charlotte Crumb, PT DPT  [phone]

## 2013-02-16 NOTE — Progress Notes (Signed)
Pt has walked x2 today. Doing well, feels good. Sts he does not need RW for home. Ed completed with wife present. Requests his name be sent to [facility].  [id number] Ethelda Chick CES, ACSM 2:06 PM 02/16/2013

## 2013-02-17 LAB — LIPID PANEL
HDL: 37 mg/dL — ABNORMAL LOW (ref >39)
LDL Cholesterol: 28 mg/dL (ref 0–99)

## 2013-02-17 LAB — PROTIME-INR
INR: 1.51 — ABNORMAL HIGH (ref 0.00–1.49)
Prothrombin Time: 17.8 seconds — ABNORMAL HIGH (ref 11.6–15.2)

## 2013-02-17 MED ORDER — ASPIRIN 81 MG PO TBEC: 81.0000 mg | DELAYED_RELEASE_TABLET | Freq: Every day | ORAL | Status: DC

## 2013-02-17 MED ORDER — POTASSIUM CHLORIDE CRYS ER 20 MEQ PO TBCR: 20.0000 meq | EXTENDED_RELEASE_TABLET | Freq: Every day | ORAL | Status: DC

## 2013-02-17 MED ORDER — OXYCODONE HCL 5 MG PO TABS: 5.0000 mg | ORAL_TABLET | ORAL | Status: DC | PRN

## 2013-02-17 MED ORDER — FUROSEMIDE 40 MG PO TABS: 40.0000 mg | ORAL_TABLET | Freq: Every day | ORAL | Status: DC

## 2013-02-17 MED ORDER — CARVEDILOL 3.125 MG PO TABS: 3.1250 mg | ORAL_TABLET | Freq: Two times a day (BID) | ORAL | Status: DC

## 2013-02-17 MED ORDER — WARFARIN SODIUM 2 MG PO TABS: 4.0000 mg | ORAL_TABLET | Freq: Every day | ORAL | Status: DC

## 2013-02-17 NOTE — Progress Notes (Signed)
[address]  [place] [address]  [phone]  Hari L Gosney  09/08/1946 67 y.o.  [id number]  01/31/2013   Steven C Hendrickson, MD  NON ST MI  ACUTE HEART FAILURE  cp  a fib  CAD  MR  HPI: 67 y.o. male w/ PMHx significant for HTN, HL, FH of MI, who presented to [facility] on 01/31/2013 with complaints of SOB and chest pain. The patient notes a 4-day history of dyspnea on exertion when walking across a room (baseline >1 block), as well as orthopnea requiring him to sit in an upright position (baseline can lie flat), and PND. He notes no LE swelling. During this same time, the patient describes chest pressure, described as a bilateral, diffuse, anterior chest pressure sensation, 7/10 in severity, non-radiating, worse with activity, with no alleviating factors. Unchanged by deep breathing, food intake, or change in position. The patient notes a non-productive cough, but no fevers, chills, sore throat, rhinorrhea, or sick contacts.  The patient presented to [facility] with these complaints, and was found to have an elevated BNP of 1862, troponin of 2.8, and bilateral opacities on CXR. EKG showed ST elevation in III, III, aVF, and ST depression in V2-V5, I, and aVL. The patient was given aspirin 324, lasix 40 IV, and started on a heparin drip. Patient also given levaquin x1 dose. He was admitted for further evaluation and treatment.  Past Medical History   Diagnosis  Date   .  High cholesterol    .  Hypertension    .  Constipation    .  Reflux    .  Complication of anesthesia      "Hard time waking me up" after sedation after dental procedure   Surgical History:  Past Surgical History   Procedure  Laterality  Date   .  Stomach ulcer repair     Home Meds:  Prior to Admission medications   Medication  Sig  Start Date  End Date  Taking?  Authorizing Provider   Linaclotide (LINZESS) 145 MCG CAPS  Take 1 capsule (145 mcg total) by mouth daily.  01/27/13     Terri L Setzer, NP   lisinopril (PRINIVIL,ZESTRIL) 10 MG tablet  Take 10 mg by mouth daily.     Historical Provider, MD   peg 3350 powder (MOVIPREP) 100 G SOLR  Take 1 kit (100 g total) by mouth once.  01/08/13    Terri L Setzer, NP   ranitidine (ZANTAC) 150 MG tablet  Take 150 mg by mouth 2 (two) times daily.     Historical Provider, MD   simvastatin (ZOCOR) 10 MG tablet  Take 10 mg by mouth at bedtime.     Historical Provider, MD   Allergies: No Known Allergies  History    Social History   .  Marital Status:  Married     Spouse Name:  N/A     Number of Children:  N/A   .  Years of Education:  N/A    Occupational History   .  Not on file.    Social History Main Topics   .  Smoking status:  Former Smoker   .  Smokeless tobacco:  Not on file      Comment: 40 yrs ago   .  Alcohol Use:  Yes      Comment: week end - 1/5 of crown   .  Drug Use:  No   .  Sexually Active:  Not   on file    Other Topics  Concern   .  Not on file    Social History Narrative   .  No narrative on file    Family History   Problem  Relation  Age of Onset   .  Heart attack  Mother  55   Hospital Course:the patient was admitted with a late presentation STEMI fat was placed on aggressive medical management. Included in presentation was acute congestive heart failure and he was aggressively diuresis.early in the presentation he was noted to have acute kidney insufficiency versus chronic kidney disease with elevated creatinine to 1.49. He was noted initially to have a leukocytosis and was treated with Levaquin but this was discontinued early as he had no significant findings consistent with infection.He Also was seen by by critical care medicine who also agreed that he did not have findings of pulmonary infection. He was felt to require cardiac catheterization which was done on 02/03/2013 by Dr. Arida with the following results noted:  Cardiac Catheterization Procedure Note  Name: Jaiceon L Diffley  MRN: [id number]    DOB: 07/23/1946  Procedure: Right Heart Cath, Left Heart Cath, Selective Coronary Angiography, LV angiography  Indication: Inferior ST elevation myocardial infarction with late presentation and mitral regurgitation.  Procedural Details: The right groin was prepped, draped, and anesthetized with 1% lidocaine. Using the modified Seldinger technique a 5 French sheath was placed in the right femoral artery and a 7 French sheath was placed in the right femoral vein. A Swan-Ganz catheter was used for the right heart catheterization. Standard protocol was followed for recording of right heart pressures and sampling of oxygen saturations. Fick cardiac output was calculated. Standard Judkins catheters were used for selective coronary angiography and left ventriculography. A JL 5 catheter was used to go to engage the left main coronary artery. There were no immediate procedural complications. The patient was transferred to the post catheterization recovery area for further monitoring.  Procedural Findings:  Hemodynamics  RA 8 mmHg  RV 42/2 mmHg  PA 47/15/35 mmHg  PCWP 30 mmHg with giant V waves  LV 82/12 mmHg . LVEDP: 26 mmHg  AO 80/57 mmHg  Oxygen saturations:  PA 56%  AO 91%  Cardiac Output (Fick) 5.04  Cardiac Index (Fick) 2.27  Aortic Valve: Peak to Peak gradient: Not significant  Pulmonary vascular resistance (PVR): Less than 1 Woods units.  Coronary angiography:  Coronary dominance: Right  Left Main: Normal in size with no significant disease.  Left Anterior Descending (LAD): Normal in size with an eccentric 95% proximal stenosis at the origin of a large first diagonal. The rest of the vessel has minor irregularities.  1st diagonal (D1): Large in size with 50-60% ostial stenosis.  2nd diagonal (D2): Small in size with minor irregularities.  3rd diagonal (D3): Very small in size.  Circumflex (LCx): Normal in size and nondominant. There is 40% tubular mid stenosis without any other obstructive  disease.  1st obtuse marginal: Small in size with minor irregularities.  2nd obtuse marginal: Large in size with 30% ostial stenosis  3rd obtuse marginal: Normal in size with minor irregularities.  Ramus Intermedius: Small in size with minor irregularities.  Right Coronary Artery: Large in size and dominant. There is significant ectasia in the proximal segment. In the midsegment, there is a 70% tubular stenosis. The vessel is subtotally occluded distally with TIMI 1 flow. It fills via faint collaterals from the left coronary arteries Left ventriculography: Left ventricular systolic function is mildly ,   LVEF is estimated at 45-50 % with significant inferior wall hypokinesis, there is severe mitral regurgitation  Final Conclusions:  1. Moderately elevated filling pressures with giant V waves noted on PCW pressure tracing. No significant pulmonary hypertension. Low normal cardiac output.  2. Mildly reduced LV systolic function with evidence of severe mitral regurgitation on left ventricular angiography.  3. Severe 2 vessel coronary artery disease with an occluded distal RCA which is likely the culprit for inferior ST elevation MI. There is also severe eccentric stenosis in the proximal LAD  Recommendations:  Cardiothoracic surgery consult for mitral valve repair and CABG. A TEE will be ordered for tomorrow. continue IV Lasix  Muhammad Arida MD, FACC  02/03/2013, 2:01 PM  He was then seen in cardiothoracic surgical consultation by Dr. Steven Hendrickson who evaluated the patient and studies and agreed with plans to undergo surgical coronary revascularization as well as mitral valve repair versus replacement. He had a preoperative TEE by Dr. Nahser on 02/04/2013 which revealed severe mitral regurgitation. He was medically stabilized and on 02/05/2013 taken the operating room at which time he underwent the following procedure:  DATE OF PROCEDURE: 02/05/2013  DATE OF DISCHARGE:  OPERATIVE REPORT    PREOPERATIVE DIAGNOSIS: Severe 2-vessel coronary disease status post  myocardial infarction, severe mitral regurgitation.  POSTOPERATIVE DIAGNOSIS: Severe 2-vessel coronary disease status post  myocardial infarction, severe mitral regurgitation with mitral  regurgitation secondary to ruptured papillary muscle.  PROCEDURE: Median sternotomy, extracorporeal circulation, coronary  artery bypass grafting x3 (left internal mammary artery to left anterior  descending, saphenous vein graft to first diagonal, saphenous vein graft  to posterior descending), attempted mitral valve repair with 32 mm Memo  3D annuloplasty ring and repair of papillary muscle. Closure of patent  foramen ovale, mitral valve replacement with Edwards Magna pericardial  valve 33 mm, model #7300 TFX, serial number [id number], endoscopic vein  harvest right thigh.  SURGEON: Steven C. Hendrickson, MD  ASSISTANT: Gina Collins, PA  ANESTHESIA: General.  FINDINGS: Transesophageal echocardiography prebypass showed inferior  hypokinesis and severe mitral regurgitation with a flail segment likely  posterior leaflet.  INTRAOPERATIVE FINDINGS: Good targets and good conduits, flail anterior  and posterior segments A3 and P3 due to rupture of the tip of the  papillary muscle, repair failed due to sutures pulling through the  papillary muscle, valve replaced. Postbypass transesophageal  echocardiography revealed good function of the prosthetic valve with no  perivalvular leaks.  The patient was taken from the operating room to the  surgical intensive care unit in good condition.  Postoperative hospital course:  The patient did have a worsening of his acute kidney injury and was seen in consultation by nephrology.history of it diuretics and made steady improvement and did not require hemodialysis.  He required aggressive pressor support as well as ventilation in the early postoperative period but was able to be weaned over time. All  routine lines, monitors and drainage devices have been discontinued in the standard fashion over time. He did have an acute blood loss anemia requiring transfusion. He did have postoperative atrial fibrillation but was subsequently chemically cardioverted to sinus rhythm with a first-degree block with ventricular ectopy. Do to a prolonged QT interval his amiodarone and subsequently been stopped. His rhythm appears stable in this regard. Incisions are healing well without evidence of infection. He is tolerating gradually increasing activities using standard protocols. He remains volume overloaded and will require ongoing diuretic as an outpatient. He is on Coumadin and INR will be monitored   as an outpatient to adjust dosing for pericardial mitral valve. Overall his status is felt to be stable for discharge on today's date.  No results found for this basename: NA, K, CL, CO2, GLUCOSE, BUN, CRATININE, CALCIUM, in the last 72 hours  No results found for this basename: WBC, HGB, HCT, PLT, in the last 72 hours   Recent Labs   02/16/13 0440  02/17/13 0520   INR  1.49  1.51*    Discharge Instructions: The patient is discharged to home with extensive instructions on wound care and progressive ambulation. They are instructed not to drive or perform any heavy lifting until returning to see the physician in his office.  Discharge Diagnosis:  NON ST MI  ACUTE HEART FAILURE  cp  a fib  CAD  MR  Secondary Diagnosis:  Patient Active Problem List    Diagnosis  Date Noted   .  Acute respiratory failure  02/08/2013   .  SVT (supraventricular tachycardia)  02/08/2013   .  Ventricular tachycardia  02/08/2013   .  Acute pulmonary edema  02/01/2013   .  Pulmonary infiltrates on CXR  02/01/2013   .  Acute CHF  01/31/2013   .  AKI (acute kidney injury)  01/31/2013   .  Leukocytosis  01/31/2013   .  Acute MI, inferior wall, initial episode of care  01/31/2013   .  Essential hypertension, benign  01/08/2013   .   High cholesterol  01/08/2013   .  Unspecified constipation  01/08/2013    Past Medical History   Diagnosis  Date   .  High cholesterol    .  Hypertension    .  Constipation    .  Reflux    .  Complication of anesthesia      "Hard time waking me up" after sedation after dental procedure      Medication List     STOP taking these medications       lisinopril 10 MG tablet    Commonly known as: PRINIVIL,ZESTRIL     TAKE these medications       aspirin 81 MG EC tablet    Take 1 tablet (81 mg total) by mouth daily.    carvedilol 3.125 MG tablet    Commonly known as: COREG    Take 1 tablet (3.125 mg total) by mouth 2 (two) times daily with a meal.    Fish Oil 1200 MG Caps    Take 1,200 mg by mouth daily.    furosemide 40 MG tablet    Commonly known as: LASIX    Take 1 tablet (40 mg total) by mouth daily.    Linaclotide 145 MCG Caps    Commonly known as: LINZESS    Take 1 capsule (145 mcg total) by mouth daily.    oxyCODONE 5 MG immediate release tablet    Commonly known as: Oxy IR/ROXICODONE    Take 1-2 tablets (5-10 mg total) by mouth every 4 (four) hours as needed.    potassium chloride SA 20 MEQ tablet    Commonly known as: K-DUR,KLOR-CON    Take 1 tablet (20 mEq total) by mouth daily.    ranitidine 150 MG tablet    Commonly known as: ZANTAC    Take 150 mg by mouth 2 (two) times daily.    simvastatin 10 MG tablet    Commonly known as: ZOCOR    Take 10 mg by mouth at bedtime.    VITAMIN C   PO    Take 1 tablet by mouth daily.    VITAMIN E PO    Take 1 capsule by mouth daily.    warfarin 2 MG tablet    Commonly known as: COUMADIN    Take 2 tablets (4 mg total) by mouth daily.      Follow-up Information    Follow up with HENDRICKSON,STEVEN C, MD. (3 weeks - office will contact you)    Contact information:    [address]  [address]  [place] [address]  [phone]       Follow up with James Hochrein, MD. (The office will contact you to for appointment to  see Dr. Hochrein in the median office in 2 weeks)    Contact information:    [address]  [address], [address]  [place] [address]  [phone]       Follow up with [facility]. (6/10/ 14 at 2 pm. at [facility] [place] cardiology office)     The patient has been discharged on:  1.Beta Blocker: Yes [ x ]  No [ ]  If No, reason:  2.Ace Inhibitor/ARB: Yes [ ]  No [ x ]  If No, reason:creatinine elevated, we'll start as outpatient  3.Statin: Yes [ x ]  No [ ]  If No, reason:  4.Ecasa: Yes [x ]  No [ ]  If No, reason:  Disposition: discharged home  Patient's condition is Good  Wayne Gold, PA-C  02/17/2013  8:48 AM  

## 2013-02-17 NOTE — Progress Notes (Signed)
   SUBJECTIVE:  Anxious to go home.  Feels OK.     PHYSICAL EXAM Filed Vitals:   02/16/13 1830 02/16/13 2054 02/17/13 0536 02/17/13 0539  BP: 105/64 104/70 119/63   Pulse: 82 70 80   Temp:  98.8 F (37.1 C) 97.6 F (36.4 C)   TempSrc:  Oral Oral   Resp:  18 18   Height:      Weight:    224 lb 13.9 oz (102 kg)  SpO2: 94% 92% 92%    General:  No distress. Lungs:  Basilar crackles Heart:  RRR, no rub Abdomen:  Positive bowel sounds, no rebound no guarding Extremities:  Diffuse edema   LABS:  Results for orders placed during the hospital encounter of 01/31/13 (from the past 24 hour(s))  PROTIME-INR     Status: Abnormal   Collection Time    02/17/13  5:20 AM      Result Value Range   Prothrombin Time 17.8 (*) 11.6 - 15.2 seconds   INR 1.51 (*) 0.00 - 1.49  LIPID PANEL     Status: Abnormal   Collection Time    02/17/13  5:20 AM      Result Value Range   Cholesterol 81  0 - 200 mg/dL   Triglycerides 82  <960 mg/dL   HDL 37 (*) >45 mg/dL   Total CHOL/HDL Ratio 2.2     VLDL 16  0 - 40 mg/dL   LDL Cholesterol 28  0 - 99 mg/dL    Intake/Output Summary (Last 24 hours) at 02/17/13 1010 Last data filed at 02/17/13 0730  Gross per 24 hour  Intake    642 ml  Output   2705 ml  Net  -2063 ml   CXR:  Vascular congestion and mild cardiomegaly; somewhat more diffuse  bilateral airspace opacification now seen, with small bilateral  pleural effusions. Findings are compatible with worsening  pulmonary edema. Given the prior appearance, underlying pneumonia  cannot be excluded.  02/17/2013   ASSESSMENT AND PLAN:  VENTRICULAR TACHYCARDIA:  No further arrhythmia.  Off of oral amiodarone at discharge.  I agree.  CARDIOMYOPATHY:  Ischemic. He will go home on a low dose of beta blocker and low dose of diuretic.   CABG/MVR:  Reasonable recovery.    CKD:  Creat continues to improve.     Lonnie [name] 02/17/2013 10:10 AM

## 2013-02-17 NOTE — Progress Notes (Signed)
[address]       [place] [address]             [phone]      12 Days Post-Op  Procedure(s) (LRB): INTRAOPERATIVE TRANSESOPHAGEAL ECHOCARDIOGRAM (N/A) ENDOVEIN HARVEST OF GREATER SAPHENOUS VEIN (Right) PATENT FORAMEN OVALE CLOSURE (N/A) MITRAL VALVE REPLACEMENT (MVR)/CORONARY ARTERY BYPASS GRAFTING (CABG) (N/A) Subjective: Feels good anxious to go home  Objective  Telemetry sinus , 1degree avb, pvc's   Temp:  [97.6 F (36.4 C)-98.8 F (37.1 C)] 97.6 F (36.4 C) (06/10 0536) Pulse Rate:  [70-83] 80 (06/10 0536) Resp:  [18-20] 18 (06/10 0536) BP: (91-121)/(51-96) 119/63 mmHg (06/10 0536) SpO2:  [90 %-97 %] 92 % (06/10 0536) Weight:  [224 lb 13.9 oz (102 kg)] 224 lb 13.9 oz (102 kg) (06/10 0539)   Intake/Output Summary (Last 24 hours) at 02/17/13 0752 Last data filed at 02/17/13 0541  Gross per 24 hour  Intake    642 ml  Output   2155 ml  Net  -1513 ml       General appearance: alert, cooperative and no distress Heart: regular rate and rhythm and occas extrasystole Lungs: clear to auscultation bilaterally Abdomen: benign Extremities: edema improved  Wound: incisions healing well  Lab Results: No results found for this basename: NA, K, CL, CO2, GLUCOSE, BUN, CREATININE, CALCIUM, MG, PHOS,  in the last 72 hours No results found for this basename: AST, ALT, ALKPHOS, BILITOT, PROT, ALBUMIN,  in the last 72 hours No results found for this basename: LIPASE, AMYLASE,  in the last 72 hours No results found for this basename: WBC, NEUTROABS, HGB, HCT, MCV, PLT,  in the last 72 hours No results found for this basename: CKTOTAL, CKMB, TROPONINI,  in the last 72 hours No components found with this basename: POCBNP,  No results found for this basename: DDIMER,  in the last 72 hours No results found for this basename: HGBA1C,  in the last 72 hours  Recent Labs  02/17/13 0520  CHOL 81  HDL 37*  LDLCALC 28  TRIG 82  CHOLHDL 2.2   No  results found for this basename: TSH, T4TOTAL, FREET3, T3FREE, THYROIDAB,  in the last 72 hours No results found for this basename: VITAMINB12, FOLATE, FERRITIN, TIBC, IRON, RETICCTPCT,  in the last 72 hours  Medications: Scheduled . aspirin EC  81 mg Oral Daily  . atorvastatin  80 mg Oral q1800  . bisacodyl  10 mg Oral Daily   Or  . bisacodyl  10 mg Rectal Daily  . carvedilol  3.125 mg Oral BID WC  . docusate sodium  200 mg Oral Daily  . feeding supplement  1 Container Oral Q1500  . furosemide  40 mg Intravenous BID  . moving right along book   Does not apply Once  . pantoprazole  40 mg Oral Q1200  . potassium chloride  20 mEq Oral Daily  . sodium chloride  3 mL Intravenous Q12H  . Warfarin - Physician Dosing Inpatient   Does not apply q1800     Radiology/Studies:  No results found.  INR:1.51 Will add last result for INR, ABG once components are confirmed Will add last 4 CBG results once components are confirmed  Assessment/Plan: S/P Procedure(s) (LRB): INTRAOPERATIVE TRANSESOPHAGEAL ECHOCARDIOGRAM (N/A) ENDOVEIN HARVEST OF GREATER SAPHENOUS VEIN (Right) PATENT FORAMEN OVALE CLOSURE (N/A) MITRAL VALVE REPLACEMENT (MVR)/CORONARY ARTERY BYPASS GRAFTING (CABG) (N/A) Plan for discharge: see discharge orders   LOS: 17 days  Lonnie [name] E 6/10/20147:52 AM

## 2013-02-17 NOTE — Progress Notes (Signed)
                    [address]            [place] [address]          [phone]     12 Days Post-Op Procedure(s) (LRB): INTRAOPERATIVE TRANSESOPHAGEAL ECHOCARDIOGRAM (N/A) ENDOVEIN HARVEST OF GREATER SAPHENOUS VEIN (Right) PATENT FORAMEN OVALE CLOSURE (N/A) MITRAL VALVE REPLACEMENT (MVR)/CORONARY ARTERY BYPASS GRAFTING (CABG) (N/A)  Subjective: Feels well, ready to go home.   Objective: Vital signs in last 24 hours: Patient Vitals for the past 24 hrs:  BP Temp Temp src Pulse Resp SpO2 Weight  02/17/13 0539 - - - - - - 224 lb 13.9 oz (102 kg)  02/17/13 0536 119/63 mmHg 97.6 F (36.4 C) Oral 80 18 92 % -  02/16/13 2054 104/70 mmHg 98.8 F (37.1 C) Oral 70 18 92 % -  02/16/13 1830 105/64 mmHg - - 82 - 94 % -  02/16/13 1803 111/69 mmHg - - - - - -  02/16/13 1800 91/51 mmHg - - 83 - 90 % -  02/16/13 1745 111/69 mmHg - - 73 - 91 % -  02/16/13 1730 107/63 mmHg - - 70 - 90 % -  02/16/13 1715 105/68 mmHg - - 70 - 90 % -  02/16/13 1700 95/64 mmHg - - 75 - 97 % -  02/16/13 1651 121/96 mmHg - - 75 - 97 % -  02/16/13 1518 91/59 mmHg 98.4 F (36.9 C) Oral 75 20 93 % -   Current Weight  02/17/13 224 lb 13.9 oz (102 kg)  02/16/13          230 lb (104.3 kg)   Intake/Output from previous day: 06/09 0701 - 06/10 0700 In: 642 [P.O.:642] Out: 2155 [Urine:2155]    PHYSICAL EXAM:  Heart: RRR Lungs: clear, slightly decreased in bases Wound: Clean and dry Extremities: Improving LE edema    Lab Results: CBC:No results found for this basename: WBC, HGB, HCT, PLT,  in the last 72 hours BMET: No results found for this basename: NA, K, CL, CO2, GLUCOSE, BUN, CREATININE, CALCIUM,  in the last 72 hours   PT/INR:  Recent Labs  02/17/13 0520  LABPROT 17.8*  INR 1.51*      Assessment/Plan: S/P Procedure(s) (LRB): INTRAOPERATIVE TRANSESOPHAGEAL ECHOCARDIOGRAM (N/A) ENDOVEIN HARVEST OF GREATER SAPHENOUS VEIN (Right) PATENT FORAMEN OVALE CLOSURE  (N/A) MITRAL VALVE REPLACEMENT (MVR)/CORONARY ARTERY BYPASS GRAFTING (CABG) (N/A) CV- SR, BPs stable. Continue current meds. Vol overload- excellent diuresis with IV Lasix.  Will resume po. Plan d/c home today- instructions reviewed with patient and wife.   LOS: 17 days    Sunaina Ferrando H 02/17/2013

## 2013-02-20 ENCOUNTER — Ambulatory Visit (INDEPENDENT_AMBULATORY_CARE_PROVIDER_SITE_OTHER): Payer: Managed Care, Other (non HMO) | Admitting: *Deleted

## 2013-02-20 DIAGNOSIS — Z954 Presence of other heart-valve replacement: Secondary | ICD-10-CM

## 2013-02-20 DIAGNOSIS — Z952 Presence of prosthetic heart valve: Secondary | ICD-10-CM | POA: Insufficient documentation

## 2013-02-20 DIAGNOSIS — Z7901 Long term (current) use of anticoagulants: Secondary | ICD-10-CM | POA: Insufficient documentation

## 2013-02-24 ENCOUNTER — Ambulatory Visit (INDEPENDENT_AMBULATORY_CARE_PROVIDER_SITE_OTHER): Payer: Managed Care, Other (non HMO) | Admitting: *Deleted

## 2013-02-24 DIAGNOSIS — Z952 Presence of prosthetic heart valve: Secondary | ICD-10-CM

## 2013-02-24 DIAGNOSIS — Z954 Presence of other heart-valve replacement: Secondary | ICD-10-CM

## 2013-02-24 DIAGNOSIS — Z7901 Long term (current) use of anticoagulants: Secondary | ICD-10-CM

## 2013-02-24 LAB — POCT INR: INR: 2.6

## 2013-02-26 NOTE — Discharge Summary (Signed)
[address]  [place] [address]  [phone]  Lonnie [name]  08-05-1946 67 y.o.  [id number]  01/31/2013   Lonnie [name]  NON ST MI  ACUTE HEART FAILURE  cp  a fib  CAD  MR  HPI: 67 y.o. male w/ PMHx significant for HTN, HL, FH of MI, who presented to [facility] on 01/31/2013 with complaints of SOB and chest pain. The patient notes a 4-day history of dyspnea on exertion when walking across a room (baseline >1 block), as well as orthopnea requiring him to sit in an upright position (baseline can lie flat), and PND. He notes no LE swelling. During this same time, the patient describes chest pressure, described as a bilateral, diffuse, anterior chest pressure sensation, 7/10 in severity, non-radiating, worse with activity, with no alleviating factors. Unchanged by deep breathing, food intake, or change in position. The patient notes a non-productive cough, but no fevers, chills, sore throat, rhinorrhea, or sick contacts.  The patient presented to [facility] with these complaints, and was found to have an elevated BNP of 1862, troponin of 2.8, and bilateral opacities on CXR. EKG showed ST elevation in III, III, aVF, and ST depression in V2-V5, I, and aVL. The patient was given aspirin 324, lasix 40 IV, and started on a heparin drip. Patient also given levaquin x1 dose. He was admitted for further evaluation and treatment.  Past Medical History   Diagnosis  Date   .  High cholesterol    .  Hypertension    .  Constipation    .  Reflux    .  Complication of anesthesia      "Hard time waking me up" after sedation after dental procedure   Surgical History:  Past Surgical History   Procedure  Laterality  Date   .  Stomach ulcer repair     Home Meds:  Prior to Admission medications   Medication  Sig  Start Date  End Date  Taking?  Authorizing Provider   Linaclotide (LINZESS) 145 MCG CAPS  Take 1 capsule (145 mcg total) by mouth daily.  01/27/13     Lonnie [name]   lisinopril (PRINIVIL,ZESTRIL) 10 MG tablet  Take 10 mg by mouth daily.     Lonnie [name]   peg 3350 powder (MOVIPREP) 100 G SOLR  Take 1 kit (100 g total) by mouth once.  01/08/13    Lonnie [name]   ranitidine (ZANTAC) 150 MG tablet  Take 150 mg by mouth 2 (two) times daily.     Lonnie [name]   simvastatin (ZOCOR) 10 MG tablet  Take 10 mg by mouth at bedtime.     Lonnie [name]   Allergies: No Known Allergies  History    Social History   .  Marital Status:  Married     Spouse Name:  N/A     Number of Children:  N/A   .  Years of Education:  N/A    Occupational History   .  Not on file.    Social History Main Topics   .  Smoking status:  Former Games developer   .  Smokeless tobacco:  Not on file      Comment: 40 yrs ago   .  Alcohol Use:  Yes      Comment: week end - 1/5 of crown   .  Drug Use:  No   .  Sexually Active:  Not  on file    Other Topics  Concern   .  Not on file    Social History Narrative   .  No narrative on file    Family History   Problem  Relation  Age of Onset   .  Heart attack  Mother  70   Hospital Course:the patient was admitted with a late presentation STEMI fat was placed on aggressive medical management. Included in presentation was acute congestive heart failure and he was aggressively diuresis.early in the presentation he was noted to have acute kidney insufficiency versus chronic kidney disease with elevated creatinine to 1.49. He was noted initially to have a leukocytosis and was treated with Levaquin but this was discontinued early as he had no significant findings consistent with infection.He Also was seen by by critical care medicine who also agreed that he did not have findings of pulmonary infection. He was felt to require cardiac catheterization which was done on 02/03/2013 by Lonnie [name] with the following results noted:  Cardiac Catheterization Procedure Note  Name: Lonnie [name]  MRN: [id number]    DOB: [birth year]  Procedure: Right Heart Cath, Left Heart Cath, Selective Coronary Angiography, LV angiography  Indication: Inferior ST elevation myocardial infarction with late presentation and mitral regurgitation.  Procedural Details: The right groin was prepped, draped, and anesthetized with 1% lidocaine. Using the modified Seldinger technique a 5 French sheath was placed in the right femoral artery and a 7 French sheath was placed in the right femoral vein. A Swan-Ganz catheter was used for the right heart catheterization. Standard protocol was followed for recording of right heart pressures and sampling of oxygen saturations. Fick cardiac output was calculated. Standard Judkins catheters were used for selective coronary angiography and left ventriculography. A JL 5 catheter was used to go to engage the left main coronary artery. There were no immediate procedural complications. The patient was transferred to the post catheterization recovery area for further monitoring.  Procedural Findings:  Hemodynamics  RA 8 mmHg  RV 42/2 mmHg  PA 47/15/35 mmHg  PCWP 30 mmHg with giant V waves  LV 82/12 mmHg . LVEDP: 26 mmHg  AO 80/57 mmHg  Oxygen saturations:  PA 56%  AO 91%  Cardiac Output (Fick) 5.04  Cardiac Index (Fick) 2.27  Aortic Valve: Peak to Peak gradient: Not significant  Pulmonary vascular resistance (PVR): Less than 1 Woods units.  Coronary angiography:  Coronary dominance: Right  Left Main: Normal in size with no significant disease.  Left Anterior Descending (LAD): Normal in size with an eccentric 95% proximal stenosis at the origin of a large first diagonal. The rest of the vessel has minor irregularities.  1st diagonal (D1): Large in size with 50-60% ostial stenosis.  2nd diagonal (D2): Small in size with minor irregularities.  3rd diagonal (D3): Very small in size.  Circumflex (LCx): Normal in size and nondominant. There is 40% tubular mid stenosis without any other obstructive  disease.  1st obtuse marginal: Small in size with minor irregularities.  2nd obtuse marginal: Large in size with 30% ostial stenosis  3rd obtuse marginal: Normal in size with minor irregularities.  Ramus Intermedius: Small in size with minor irregularities.  Right Coronary Artery: Large in size and dominant. There is significant ectasia in the proximal segment. In the midsegment, there is a 70% tubular stenosis. The vessel is subtotally occluded distally with TIMI 1 flow. It fills via faint collaterals from the left coronary arteries Left ventriculography: Left ventricular systolic function is mildly ,  LVEF is estimated at 45-50 % with significant inferior wall hypokinesis, there is severe mitral regurgitation  Final Conclusions:  1. Moderately elevated filling pressures with giant V waves noted on PCW pressure tracing. No significant pulmonary hypertension. Low normal cardiac output.  2. Mildly reduced LV systolic function with evidence of severe mitral regurgitation on left ventricular angiography.  3. Severe 2 vessel coronary artery disease with an occluded distal RCA which is likely the culprit for inferior ST elevation MI. There is also severe eccentric stenosis in the proximal LAD  Recommendations:  Cardiothoracic surgery consult for mitral valve repair and CABG. A TEE will be ordered for tomorrow. continue IV Lasix  Lorine Bears [name], [facility]  02/03/2013, 2:01 PM  He was then seen in cardiothoracic surgical consultation by Dr. Charlett Lango who evaluated the patient and studies and agreed with plans to undergo surgical coronary revascularization as well as mitral valve repair versus replacement. He had a preoperative TEE by Dr. Elease Hashimoto on 02/04/2013 which revealed severe mitral regurgitation. He was medically stabilized and on 02/05/2013 taken the operating room at which time he underwent the following procedure:  DATE OF PROCEDURE: 02/05/2013  DATE OF DISCHARGE:  OPERATIVE REPORT    PREOPERATIVE DIAGNOSIS: Severe 2-vessel coronary disease status post  myocardial infarction, severe mitral regurgitation.  POSTOPERATIVE DIAGNOSIS: Severe 2-vessel coronary disease status post  myocardial infarction, severe mitral regurgitation with mitral  regurgitation secondary to ruptured papillary muscle.  PROCEDURE: Median sternotomy, extracorporeal circulation, coronary  artery bypass grafting x3 (left internal mammary artery to left anterior  descending, saphenous vein graft to first diagonal, saphenous vein graft  to posterior descending), attempted mitral valve repair with 32 mm Memo  3D annuloplasty ring and repair of papillary muscle. Closure of patent  foramen ovale, mitral valve replacement with [facility] pericardial  valve 33 mm, model #7300 TFX, serial number [id number], endoscopic vein  harvest right thigh.  SURGEON: Salvatore Decent. Dorris Fetch, [name]  ASSISTANT: Coral Ceo, PA  ANESTHESIA: General.  FINDINGS: Transesophageal echocardiography prebypass showed inferior  hypokinesis and severe mitral regurgitation with a flail segment likely  posterior leaflet.  INTRAOPERATIVE FINDINGS: Good targets and good conduits, flail anterior  and posterior segments A3 and P3 due to rupture of the tip of the  papillary muscle, repair failed due to sutures pulling through the  papillary muscle, valve replaced. Postbypass transesophageal  echocardiography revealed good function of the prosthetic valve with no  perivalvular leaks.  The patient was taken from the operating room to the  surgical intensive care unit in good condition.  Postoperative hospital course:  The patient did have a worsening of his acute kidney injury and was seen in consultation by nephrology.history of it diuretics and made steady improvement and did not require hemodialysis.  He required aggressive pressor support as well as ventilation in the early postoperative period but was able to be weaned over time. All  routine lines, monitors and drainage devices have been discontinued in the standard fashion over time. He did have an acute blood loss anemia requiring transfusion. He did have postoperative atrial fibrillation but was subsequently chemically cardioverted to sinus rhythm with a first-degree block with ventricular ectopy. Do to a prolonged QT interval his amiodarone and subsequently been stopped. His rhythm appears stable in this regard. Incisions are healing well without evidence of infection. He is tolerating gradually increasing activities using standard protocols. He remains volume overloaded and will require ongoing diuretic as an outpatient. He is on Coumadin and INR will be monitored  as an outpatient to adjust dosing for pericardial mitral valve. Overall his status is felt to be stable for discharge on today's date.  No results found for this basename: NA, K, CL, CO2, GLUCOSE, BUN, CRATININE, CALCIUM, in the last 72 hours  No results found for this basename: WBC, HGB, HCT, PLT, in the last 72 hours   Recent Labs   02/16/13 0440  02/17/13 0520   INR  1.49  1.51*    Discharge Instructions: The patient is discharged to home with extensive instructions on wound care and progressive ambulation. They are instructed not to drive or perform any heavy lifting until returning to see the physician in his office.  Discharge Diagnosis:  NON ST MI  ACUTE HEART FAILURE  cp  a fib  CAD  MR  Secondary Diagnosis:  Patient Active Problem List    Diagnosis  Date Noted   .  Acute respiratory failure  02/08/2013   .  SVT (supraventricular tachycardia)  02/08/2013   .  Ventricular tachycardia  02/08/2013   .  Acute pulmonary edema  02/01/2013   .  Pulmonary infiltrates on CXR  02/01/2013   .  Acute CHF  01/31/2013   .  AKI (acute kidney injury)  01/31/2013   .  Leukocytosis  01/31/2013   .  Acute MI, inferior wall, initial episode of care  01/31/2013   .  Essential hypertension, benign  01/08/2013   .   High cholesterol  01/08/2013   .  Unspecified constipation  01/08/2013    Past Medical History   Diagnosis  Date   .  High cholesterol    .  Hypertension    .  Constipation    .  Reflux    .  Complication of anesthesia      "Hard time waking me up" after sedation after dental procedure      Medication List     STOP taking these medications       lisinopril 10 MG tablet    Commonly known as: PRINIVIL,ZESTRIL     TAKE these medications       aspirin 81 MG EC tablet    Take 1 tablet (81 mg total) by mouth daily.    carvedilol 3.125 MG tablet    Commonly known as: COREG    Take 1 tablet (3.125 mg total) by mouth 2 (two) times daily with a meal.    Fish Oil 1200 MG Caps    Take 1,200 mg by mouth daily.    furosemide 40 MG tablet    Commonly known as: LASIX    Take 1 tablet (40 mg total) by mouth daily.    Linaclotide 145 MCG Caps    Commonly known as: LINZESS    Take 1 capsule (145 mcg total) by mouth daily.    oxyCODONE 5 MG immediate release tablet    Commonly known as: Oxy IR/ROXICODONE    Take 1-2 tablets (5-10 mg total) by mouth every 4 (four) hours as needed.    potassium chloride SA 20 MEQ tablet    Commonly known as: K-DUR,KLOR-CON    Take 1 tablet (20 mEq total) by mouth daily.    ranitidine 150 MG tablet    Commonly known as: ZANTAC    Take 150 mg by mouth 2 (two) times daily.    simvastatin 10 MG tablet    Commonly known as: ZOCOR    Take 10 mg by mouth at bedtime.    VITAMIN C  PO    Take 1 tablet by mouth daily.    VITAMIN E PO    Take 1 capsule by mouth daily.    warfarin 2 MG tablet    Commonly known as: COUMADIN    Take 2 tablets (4 mg total) by mouth daily.      Follow-up Information    Follow up with HENDRICKSON,STEVEN C, [name]. (3 weeks - office will contact you)    Contact information:    [address]  [address]  [place] [address]  [phone]       Follow up with Rollene Rotunda, [name]. (The office will contact you to for appointment to  see Dr. Antoine Poche in the median office in 2 weeks)    Contact information:    [address]  [address] [place]lyn [place]  [place] [address]  [phone]       Follow up with [facility]. (6/10/ 14 at 2 pm. at [facility] cardiology office)     The patient has been discharged on:  1.Beta Blocker: Yes [ x ]  No [ ]   If No, reason:  2.Ace Inhibitor/ARB: Yes [ ]   No [ x ]  If No, reason:creatinine elevated, we'll start as outpatient  3.Statin: Yes [ x ]  No [ ]   If No, reason:  4.Ecasa: Yes [x ]  No [ ]   If No, reason:  Disposition: discharged home  Patient's condition is Good  Gershon Crane, PA-C  02/17/2013  8:48 AM

## 2013-03-06 ENCOUNTER — Ambulatory Visit (INDEPENDENT_AMBULATORY_CARE_PROVIDER_SITE_OTHER): Payer: Managed Care, Other (non HMO) | Admitting: *Deleted

## 2013-03-06 DIAGNOSIS — Z954 Presence of other heart-valve replacement: Secondary | ICD-10-CM

## 2013-03-06 DIAGNOSIS — Z952 Presence of prosthetic heart valve: Secondary | ICD-10-CM

## 2013-03-06 DIAGNOSIS — Z7901 Long term (current) use of anticoagulants: Secondary | ICD-10-CM

## 2013-03-06 LAB — POCT INR: INR: 1.7

## 2013-03-09 ENCOUNTER — Ambulatory Visit (INDEPENDENT_AMBULATORY_CARE_PROVIDER_SITE_OTHER): Payer: Managed Care, Other (non HMO) | Admitting: Cardiology

## 2013-03-09 ENCOUNTER — Encounter: Payer: Self-pay | Admitting: Cardiology

## 2013-03-09 VITALS — BP 117/78 | HR 97 | Ht 73.0 in | Wt 215.0 lb

## 2013-03-09 DIAGNOSIS — I498 Other specified cardiac arrhythmias: Secondary | ICD-10-CM

## 2013-03-09 DIAGNOSIS — I509 Heart failure, unspecified: Secondary | ICD-10-CM

## 2013-03-09 MED ORDER — MEDICAL COMPRESSION SOCKS MISC: 1.0000 | Status: DC

## 2013-03-09 NOTE — Patient Instructions (Addendum)
Your physician recommends that you schedule a follow-up appointment in: 1 month. Your physician recommends that you continue on your current medications as directed. Please refer to the Current Medication list given to you today. You have been given a prescription today to wear compression hose.

## 2013-03-09 NOTE — Progress Notes (Signed)
HPI The patient presents for evaluation after CABG and mitral valve replacement. He presented late after myocardial infarction. 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 He ultimately required CABG with bioprosthetic mitral valve replacement. He had a complicated course with renal insufficiency but has been slowly recovering at home. He thinks he feels much better though he is having some sternal wound discomfort occasionally. This is only when he's lying on one side or the other. He's not having any of the chest discomfort that was his previous angina. He denies any cough fevers or chills. He's had no acute shortness of breath, PND or orthopnea. His lower extremities swelling is improved with about a 10 pound weight loss. He's doing some walking but isn't planning of this is abating in cardiac rehabilitation.  No Known Allergies  Current Outpatient Prescriptions  Medication Sig Dispense Refill  . Ascorbic Acid (VITAMIN C PO) Take 1 tablet by mouth daily.      Marland Kitchen aspirin EC 81 MG EC tablet Take 1 tablet (81 mg total) by mouth daily.      . carvedilol (COREG) 3.125 MG tablet Take 1 tablet (3.125 mg total) by mouth 2 (two) times daily with a meal.  60 tablet  1  . Elastic Bandages & Supports (MEDICAL COMPRESSION SOCKS) MISC 1 each by Does not apply route as directed.  1 each  0  . furosemide (LASIX) 40 MG tablet Take 1 tablet (40 mg total) by mouth daily.  30 tablet  0  . Linaclotide (LINZESS) 145 MCG CAPS Take 1 capsule (145 mcg total) by mouth daily.  30 capsule  4  . Omega-3 Fatty Acids (FISH OIL) 1200 MG CAPS Take 1,200 mg by mouth daily.      Marland Kitchen oxyCODONE (OXY IR/ROXICODONE) 5 MG immediate release tablet Take 1-2 tablets (5-10 mg total) by mouth every 4 (four) hours as needed.  50 tablet  0  . potassium chloride SA (K-DUR,KLOR-CON) 20 MEQ tablet Take 1 tablet (20 mEq total) by mouth daily.  30 tablet  0  . ranitidine (ZANTAC) 150 MG tablet Take 150 mg by mouth 2 (two)  times daily.      . simvastatin (ZOCOR) 10 MG tablet Take 10 mg by mouth at bedtime.      Marland Kitchen VITAMIN E PO Take 1 capsule by mouth daily.      Marland Kitchen warfarin (COUMADIN) 2 MG tablet Take 2 tablets (4 mg total) by mouth daily.  100 tablet  0  . MOVIPREP 100 G SOLR 1 kit once. Still hasn't schedule procedure       No current facility-administered medications for this visit.    Past Medical History  Diagnosis Date  . High cholesterol   . Hypertension   . Constipation   . Reflux   . Complication of anesthesia     "Hard time waking me up" after sedation after dental procedure  . CAD (coronary artery disease)     LAD 95% proximal stenosis, D1 50-60% stenosis, the circumflex 40% stenosis, RCA subtotal stenosis.  . Cardiomyopathy, ischemic     EF was 30-35% by echo but 45-50% by cath  . Mitral regurgitation     Secondary to papillary muscle rupture    Past Surgical History  Procedure Laterality Date  . Stomach ulcer repair    . Tee without cardioversion N/A 02/04/2013    Procedure: TRANSESOPHAGEAL ECHOCARDIOGRAM (TEE);  Surgeon: Vesta Mixer, MD;  Location: [facility] ENDOSCOPY;  Service: Cardiovascular;  Laterality: N/A;  .  Intraoperative transesophageal echocardiogram N/A 02/05/2013    Procedure: INTRAOPERATIVE TRANSESOPHAGEAL ECHOCARDIOGRAM;  Surgeon: [name], MD;  Location: [facility] OR;  Service: Open Heart Surgery;  Laterality: N/A;  . Endovein harvest of greater saphenous vein Right 02/05/2013    Procedure: ENDOVEIN HARVEST OF GREATER SAPHENOUS VEIN;  Surgeon: [name], MD;  Location: [facility] OR;  Service: Open Heart Surgery;  Laterality: Right;  . Patent foramen ovale closure N/A 02/05/2013    Procedure: PATENT FORAMEN OVALE CLOSURE;  Surgeon: [name], MD;  Location: [facility] OR;  Service: Open Heart Surgery;  Laterality: N/A;  . Mitral valve replacement (mvr)/coronary artery bypass grafting (cabg) N/A 02/05/2013    Procedure: MITRAL VALVE REPLACEMENT (MVR)/CORONARY ARTERY  BYPASS GRAFTING (CABG);  Surgeon: [name], MD;  Location: [facility] OR;  Service: Open Heart Surgery;  Laterality: N/A;  x3 using right greater saphenous vein and left internal mammary.     ROS:  As stated in the HPI and negative for all other systems.   PHYSICAL EXAM BP 117/78  Pulse 97  Ht 6\' 1"  (1.854 m)  Wt 215 lb (97.523 kg)  BMI 28.37 kg/m2 GENERAL:  Well appearing HEENT:  Pupils equal round and reactive, fundi not visualized, oral mucosa unremarkable NECK:  Positive jugular venous distention 10 cm at 45, waveform within normal limits, carotid upstroke brisk and symmetric, no bruits, no thyromegaly LYMPHATICS:  No cervical, inguinal adenopathy LUNGS:  Slightly diminished breath sounds at the bases BACK:  No CVA tenderness CHEST:  Well healed sternotomy scar. HEART:  PMI not displaced or sustained,S1 and S2 within normal limits, no S3, no S4, no clicks, no rubs, no murmurs ABD:  Flat, positive bowel sounds normal in frequency in pitch, no bruits, no rebound, no guarding, no midline pulsatile mass, no hepatomegaly, no splenomegaly EXT:  2 plus pulses throughout, moderate right greater than left lower extremity edema, no cyanosis no clubbing SKIN:  No rashes no nodules NEURO:  Cranial nerves II through XII grossly intact, motor grossly intact throughout PSYCH:  Cognitively intact, oriented to person place and time   EKG:  Sinus rhythm with first degree AV block with premature ectopic complexes, borderline interventricular conduction delay, inferior and lateral myocardial infarction, no acute ST-T wave changes.  03/09/2013  ASSESSMENT AND PLAN  CARDIOMYOPATHY:  He has an ischemic cardiomyopathy with an EF of 30-35%. For now I cannot titrate his medications but I hope to be able to. In the next several months I will repeat an echocardiogram and consider ICD if his EF remains below 35%.  MVR: I will keep him on anticoagulation for now. He will followup with Dr. Dorris Fetch soon  and will get chest x-ray. I will be following up with echocardiography in the future as above.  CAD:  The patient is having no symptoms. No further cardiovascular testing is suggested. He will continue with risk reduction.

## 2013-03-11 ENCOUNTER — Other Ambulatory Visit: Payer: Self-pay | Admitting: *Deleted

## 2013-03-11 DIAGNOSIS — I2510 Atherosclerotic heart disease of native coronary artery without angina pectoris: Secondary | ICD-10-CM

## 2013-03-12 ENCOUNTER — Ambulatory Visit (INDEPENDENT_AMBULATORY_CARE_PROVIDER_SITE_OTHER): Payer: Managed Care, Other (non HMO) | Admitting: *Deleted

## 2013-03-12 DIAGNOSIS — Z952 Presence of prosthetic heart valve: Secondary | ICD-10-CM

## 2013-03-12 DIAGNOSIS — Z954 Presence of other heart-valve replacement: Secondary | ICD-10-CM

## 2013-03-12 DIAGNOSIS — Z7901 Long term (current) use of anticoagulants: Secondary | ICD-10-CM

## 2013-03-12 LAB — POCT INR: INR: 1.7

## 2013-03-17 ENCOUNTER — Encounter: Payer: Managed Care, Other (non HMO) | Admitting: Thoracic Surgery (Cardiothoracic Vascular Surgery)

## 2013-03-17 ENCOUNTER — Other Ambulatory Visit: Payer: Self-pay | Admitting: *Deleted

## 2013-03-17 ENCOUNTER — Ambulatory Visit
Admission: RE | Admit: 2013-03-17 | Discharge: 2013-03-17 | Disposition: A | Discharge status: Home / Self Care | Payer: Managed Care, Other (non HMO) | Source: Ambulatory Visit | Attending: Thoracic Surgery (Cardiothoracic Vascular Surgery) | Admitting: Thoracic Surgery (Cardiothoracic Vascular Surgery)

## 2013-03-17 ENCOUNTER — Ambulatory Visit (INDEPENDENT_AMBULATORY_CARE_PROVIDER_SITE_OTHER): Payer: Self-pay | Admitting: Thoracic Surgery (Cardiothoracic Vascular Surgery)

## 2013-03-17 ENCOUNTER — Encounter: Payer: Self-pay | Admitting: Thoracic Surgery (Cardiothoracic Vascular Surgery)

## 2013-03-17 VITALS — BP 115/75 | HR 86 | Resp 16 | Ht 73.0 in | Wt 220.0 lb

## 2013-03-17 DIAGNOSIS — I059 Rheumatic mitral valve disease, unspecified: Secondary | ICD-10-CM

## 2013-03-17 DIAGNOSIS — Z09 Encounter for follow-up examination after completed treatment for conditions other than malignant neoplasm: Secondary | ICD-10-CM

## 2013-03-17 DIAGNOSIS — I2510 Atherosclerotic heart disease of native coronary artery without angina pectoris: Secondary | ICD-10-CM

## 2013-03-17 DIAGNOSIS — Z954 Presence of other heart-valve replacement: Secondary | ICD-10-CM

## 2013-03-17 DIAGNOSIS — Q211 Atrial septal defect: Secondary | ICD-10-CM

## 2013-03-17 DIAGNOSIS — Z951 Presence of aortocoronary bypass graft: Secondary | ICD-10-CM

## 2013-03-17 DIAGNOSIS — I340 Nonrheumatic mitral (valve) insufficiency: Secondary | ICD-10-CM

## 2013-03-17 DIAGNOSIS — Z952 Presence of prosthetic heart valve: Secondary | ICD-10-CM

## 2013-03-17 MED ORDER — FUROSEMIDE 40 MG PO TABS: 40.0000 mg | ORAL_TABLET | Freq: Every day | ORAL | Status: DC

## 2013-03-17 NOTE — Progress Notes (Signed)
HPI:  [name] is a 67 year old gentleman who presents today for a scheduled postoperative followup visit. He presented in may with an out of hospital MI. He had three-vessel disease and severe mitral regurgitation due to rupture of one head of his posterior papillary muscle. He underwent coronary bypass grafting and mitral valve replacement on 02/05/2013.  He says that he feels well. He has some mild incisional pain but is not taking narcotics. He is not having any shortness of breath or swelling in his legs. He did stop taking potassium because it was causing him to be very nauseated the mornings. He is anxious to resume full activities.  Past Medical History  Diagnosis Date  . High cholesterol   . Hypertension   . Constipation   . Reflux   . Complication of anesthesia     "Hard time waking me up" after sedation after dental procedure  . CAD (coronary artery disease)     LAD 95% proximal stenosis, D1 50-60% stenosis, the circumflex 40% stenosis, RCA subtotal stenosis.  . Cardiomyopathy, ischemic     EF was 30-35% by echo but 45-50% by cath  . Mitral regurgitation     Secondary to papillary muscle rupture      Current Outpatient Prescriptions  Medication Sig Dispense Refill  . Ascorbic Acid (VITAMIN C PO) Take 1 tablet by mouth daily.      Marland Kitchen aspirin EC 81 MG EC tablet Take 1 tablet (81 mg total) by mouth daily.      . carvedilol (COREG) 3.125 MG tablet Take 1 tablet (3.125 mg total) by mouth 2 (two) times daily with a meal.  60 tablet  1  . Elastic Bandages & Supports (MEDICAL COMPRESSION SOCKS) MISC 1 each by Does not apply route as directed.  1 each  0  . furosemide (LASIX) 40 MG tablet Take 1 tablet (40 mg total) by mouth daily.  30 tablet  3  . Linaclotide (LINZESS) 145 MCG CAPS Take 1 capsule (145 mcg total) by mouth daily.  30 capsule  4  . Omega-3 Fatty Acids (FISH OIL) 1200 MG CAPS Take 1,200 mg by mouth daily.      . ranitidine (ZANTAC) 150 MG tablet Take 150 mg by  mouth 2 (two) times daily.      . simvastatin (ZOCOR) 10 MG tablet Take 10 mg by mouth at bedtime.      Marland Kitchen VITAMIN E PO Take 1 capsule by mouth daily.      Marland Kitchen warfarin (COUMADIN) 2 MG tablet Take 2 tablets (4 mg total) by mouth daily.  100 tablet  0   No current facility-administered medications for this visit.    Physical Exam BP 115/75  Pulse 86  Resp 16  Ht 6\' 1"  (1.854 m)  Wt 220 lb (99.791 kg)  BMI 29.03 kg/m2  SpO79 75% 67 year old male in no acute distress Neurologic alert oriented x3 with no focal motor deficits, decreased sensation right lateral fourth finger and right fifth finger Cardiac regular rate and rhythm normal S1 and S2 no rubs murmurs or gallops Lungs clear with equal breath sounds bilaterally Sternum stable, incision clean dry and intact Leg incision healing well, no peripheral edema  Diagnostic Tests: Chest x-ray 03/17/2013 Clinical Data: Coronary artery disease. Heart surgery 1 month ago  CHEST - 2 VIEW  Comparison: 02/14/2013  Findings: CABG with mitral valve replacement. Mild cardiac  enlargement.  Improvement in vascular congestion and mild edema since the prior  study. Small effusions have resolved.  There is elevation of the  right hemidiaphragm with right lower lobe atelectasis which appears  chronic.  IMPRESSION:  Improvement in edema and pleural effusions. There is mild vascular  congestion.  Original Report Authenticated By: Janeece Riggers, M.D.   Impression: 67 year old gentleman status post hospital MI, treated by papillary muscle rupture and severe mitral regurgitation. He underwent coronary bypass grafting and mitral valve replacement with a tissue valve on may 29. He's really doing very well at this point in time. He is about 5 weeks out from surgery. He is anxious to resume full activities. I recommended that he not lift anything over 10 pounds for another week and nothing over 20 pounds for 3 more weeks. He had questions about swimming in a  lake, I recommended he wait until his been 8 weeks before he does that. Likewise with riding a wave runner. He may begin driving a car, appropriate precautions were discussed. He will avoid high speeds and heavy traffic for at least another month. Other than those precautions his activities are unrestricted.  His chest x-ray still shows some vascular congestion the lungs so I'm going to keep him on the Lasix for now. I gave him an additional 30 tablets and 2 refills. He sees Dr. Antoine Poche in August and may be able to stop the Lasix at that time. He's having a lot of nausea from potassium, so he will be bananas and tomatoes and not take potassium supplements.  I do think he probably should stay on Coumadin for another month just to be safe. Again that may be able to be discontinued when he sees Dr. Antoine Poche August.  Plan: I will be happy to see Lonnie [name] back any time if I can be of any further assistance with his care.

## 2013-03-19 ENCOUNTER — Other Ambulatory Visit: Payer: Self-pay | Admitting: Cardiology

## 2013-03-19 MED ORDER — WARFARIN SODIUM 2 MG PO TABS: 4.0000 mg | ORAL_TABLET | Freq: Every day | ORAL | Status: DC

## 2013-03-20 ENCOUNTER — Ambulatory Visit (INDEPENDENT_AMBULATORY_CARE_PROVIDER_SITE_OTHER): Payer: Managed Care, Other (non HMO) | Admitting: *Deleted

## 2013-03-20 DIAGNOSIS — Z952 Presence of prosthetic heart valve: Secondary | ICD-10-CM

## 2013-03-20 DIAGNOSIS — Z7901 Long term (current) use of anticoagulants: Secondary | ICD-10-CM

## 2013-03-20 DIAGNOSIS — Z954 Presence of other heart-valve replacement: Secondary | ICD-10-CM

## 2013-03-24 ENCOUNTER — Other Ambulatory Visit: Payer: Self-pay | Admitting: *Deleted

## 2013-03-24 MED ORDER — WARFARIN SODIUM 2 MG PO TABS: ORAL_TABLET | ORAL | Status: DC

## 2013-03-31 ENCOUNTER — Ambulatory Visit (INDEPENDENT_AMBULATORY_CARE_PROVIDER_SITE_OTHER): Payer: Managed Care, Other (non HMO) | Admitting: *Deleted

## 2013-03-31 DIAGNOSIS — Z952 Presence of prosthetic heart valve: Secondary | ICD-10-CM

## 2013-03-31 DIAGNOSIS — Z954 Presence of other heart-valve replacement: Secondary | ICD-10-CM

## 2013-03-31 DIAGNOSIS — Z7901 Long term (current) use of anticoagulants: Secondary | ICD-10-CM

## 2013-04-10 ENCOUNTER — Ambulatory Visit (INDEPENDENT_AMBULATORY_CARE_PROVIDER_SITE_OTHER): Payer: Managed Care, Other (non HMO) | Admitting: *Deleted

## 2013-04-10 DIAGNOSIS — Z952 Presence of prosthetic heart valve: Secondary | ICD-10-CM

## 2013-04-10 DIAGNOSIS — Z7901 Long term (current) use of anticoagulants: Secondary | ICD-10-CM

## 2013-04-10 DIAGNOSIS — Z954 Presence of other heart-valve replacement: Secondary | ICD-10-CM

## 2013-04-19 ENCOUNTER — Other Ambulatory Visit: Payer: Self-pay | Admitting: Surgical

## 2013-04-20 ENCOUNTER — Other Ambulatory Visit: Payer: Self-pay | Admitting: Cardiology

## 2013-04-20 ENCOUNTER — Telehealth: Payer: Self-pay

## 2013-04-20 MED ORDER — CARVEDILOL 3.125 MG PO TABS: 3.1250 mg | ORAL_TABLET | Freq: Two times a day (BID) | ORAL | Status: DC

## 2013-04-23 ENCOUNTER — Ambulatory Visit (INDEPENDENT_AMBULATORY_CARE_PROVIDER_SITE_OTHER): Payer: Managed Care, Other (non HMO) | Admitting: Cardiology

## 2013-04-23 ENCOUNTER — Encounter: Payer: Self-pay | Admitting: Cardiology

## 2013-04-23 VITALS — BP 122/82 | HR 60 | Ht 73.0 in | Wt 212.0 lb

## 2013-04-23 DIAGNOSIS — Z954 Presence of other heart-valve replacement: Secondary | ICD-10-CM

## 2013-04-23 MED ORDER — CARVEDILOL 6.25 MG PO TABS: 6.2500 mg | ORAL_TABLET | Freq: Two times a day (BID) | ORAL | Status: DC

## 2013-04-23 MED ORDER — FUROSEMIDE 20 MG PO TABS: 20.0000 mg | ORAL_TABLET | Freq: Every day | ORAL | Status: DC

## 2013-04-23 NOTE — Patient Instructions (Addendum)
Your physician recommends that you schedule a follow-up appointment in 1 month. Your physician has recommended you make the following change in your medication: STOP WARFARIN. DECREASE YOUR FUROSEMIDE TO 20 MG DAILY. You may break your 40 mg tablet in 1/2 daily until they are finished. INCREASED YOUR CARVEDILOL TO 6.25 MG TWICE DAILY. You may take (2) of your 3.125 mg carvedilol tablets twice daily until they are finished. Your new prescriptions have been sent to your pharmacy. All other medications will remain the same.

## 2013-04-23 NOTE — Progress Notes (Signed)
HPI The patient presents for evaluation after CABG and mitral valve replacement. He presented late after myocardial infarction. 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 He ultimately required CABG with bioprosthetic mitral valve replacement. He had a complicated course with renal insufficiency.  Since I last saw him he's been doing better. He is staying active and stacking wood. He has none of the sternal discomfort that he had.  His breathing is improved and less he is bending over. He denies any chest pressure, neck or arm discomfort.  She's not noticing any palpitations, presyncope or syncope. She's not having any PND or orthopnea. He's had no fevers or chills.   No Known Allergies  Current Outpatient Prescriptions  Medication Sig Dispense Refill  . aspirin EC 81 MG EC tablet Take 1 tablet (81 mg total) by mouth daily.      . carvedilol (COREG) 3.125 MG tablet Take 1 tablet (3.125 mg total) by mouth 2 (two) times daily with a meal.  60 tablet  0  . furosemide (LASIX) 40 MG tablet Take 1 tablet (40 mg total) by mouth daily.  30 tablet  3  . ranitidine (ZANTAC) 150 MG tablet Take 150 mg by mouth 2 (two) times daily.      . simvastatin (ZOCOR) 10 MG tablet Take 10 mg by mouth at bedtime.      Marland Kitchen warfarin (COUMADIN) 2 MG tablet Take as directed by coumadin clinic  90 tablet  3   No current facility-administered medications for this visit.    Past Medical History  Diagnosis Date  . High cholesterol   . Hypertension   . Constipation   . Reflux   . Complication of anesthesia     "Hard time waking me up" after sedation after dental procedure  . CAD (coronary artery disease)     LAD 95% proximal stenosis, D1 50-60% stenosis, the circumflex 40% stenosis, RCA subtotal stenosis.  . Cardiomyopathy, ischemic     EF was 30-35% by echo but 45-50% by cath  . Mitral regurgitation     Secondary to papillary muscle rupture    Past Surgical History  Procedure  Laterality Date  . Stomach ulcer repair    . Tee without cardioversion N/A 02/04/2013    Procedure: TRANSESOPHAGEAL ECHOCARDIOGRAM (TEE);  Surgeon: Vesta Mixer, MD;  [facility]: [facility] ENDOSCOPY;  Service: Cardiovascular;  Laterality: N/A;  . Intraoperative transesophageal echocardiogram N/A 02/05/2013    Procedure: INTRAOPERATIVE TRANSESOPHAGEAL ECHOCARDIOGRAM;  Surgeon: [name], MD;  Location: [facility] OR;  Service: Open Heart Surgery;  Laterality: N/A;  . Endovein harvest of greater saphenous vein Right 02/05/2013    Procedure: ENDOVEIN HARVEST OF GREATER SAPHENOUS VEIN;  Surgeon: [name], MD;  Location: [facility] OR;  Service: Open Heart Surgery;  Laterality: Right;  . Patent foramen ovale closure N/A 02/05/2013    Procedure: PATENT FORAMEN OVALE CLOSURE;  Surgeon: [name], MD;  Location: [facility] OR;  Service: Open Heart Surgery;  Laterality: N/A;  . Mitral valve replacement (mvr)/coronary artery bypass grafting (cabg) N/A 02/05/2013    Procedure: MITRAL VALVE REPLACEMENT (MVR)/CORONARY ARTERY BYPASS GRAFTING (CABG);  Surgeon: [name], MD;  Location: [facility] OR;  Service: Open Heart Surgery;  Laterality: N/A;  x3 using right greater saphenous vein and left internal mammary.     ROS:  As stated in the HPI and negative for all other systems.   PHYSICAL EXAM BP 122/82  Pulse 60  Ht 6\' 1"  (1.854 m)  Wt  212 lb (96.163 kg)  BMI 27.98 kg/m2 GENERAL:  Well appearing HEENT:  Pupils equal round and reactive, fundi not visualized, oral mucosa unremarkable NECK:  Positive jugular venous distention 7 cm at 45, waveform within normal limits, carotid upstroke brisk and symmetric, no bruits, no thyromegaly LYMPHATICS:  No cervical, inguinal adenopathy LUNGS:  Slightly diminished breath sounds at the bases BACK:  No CVA tenderness CHEST:  Well healed sternotomy scar. HEART:  PMI not displaced or sustained,S1 and S2 within normal limits, no S3, no S4, no clicks, no rubs, no  murmurs ABD:  Flat, positive bowel sounds normal in frequency in pitch, no bruits, no rebound, no guarding, no midline pulsatile mass, no hepatomegaly, no splenomegaly EXT:  2 plus pulses throughout, trace bilateral lower extremity lower extremity edema, no cyanosis no clubbing SKIN:  No rashes no nodules NEURO:  Cranial nerves II through XII grossly intact, motor grossly intact throughout PSYCH:  Cognitively intact, oriented to person place and time   ASSESSMENT AND PLAN  CARDIOMYOPATHY:  He has an ischemic cardiomyopathy with an EF of 30-35%.  In the next several months I will repeat an echocardiogram and consider ICD if his EF remains below 35%.  Today I will increase his carvedilol to 6.25 mg twice a day.   He will reduce his Lasix to 20 mg daily and he can stop his warfarin.  MVR: this will be followed up in the future with repeat echocardiography.  CAD:  The patient is having no symptoms. No further cardiovascular testing is suggested. He will continue with risk reduction.

## 2013-05-20 ENCOUNTER — Telehealth: Payer: Self-pay | Admitting: Cardiology

## 2013-05-20 NOTE — Telephone Encounter (Signed)
New Problem  Pt states he cannot see// is seing dots/// Vision is getting worse// believes that it is due to his heart condition.

## 2013-05-20 NOTE — Telephone Encounter (Signed)
Spoke with patient who complains of visual disturbances x 1 week.  Patient states he saw Dr. Charise Killian at [facility] in [place] yesterday and that eye exam was normal; states he was told to follow up as soon as possible with his cardiologist. Patient states he is seeing floaters and sometimes seeing "something like ceiling fans."  Patient denies chest discomfort, SOB or any other symptoms.  Patient states he is willing to come to [facility] Office to see Dr. Antoine Poche if possible.  I advised that Dr. Antoine Poche is out of the office this week and patient requested earliest appointment next week.  I scheduled patient for 9/16 at 1000 here at [facility] and called Dr. Loraine Leriche Cotter's office (eye doctor) in [place] and asked for visit note from 9/9 to be faxed.

## 2013-05-26 ENCOUNTER — Encounter: Payer: Self-pay | Admitting: *Deleted

## 2013-05-26 ENCOUNTER — Ambulatory Visit (INDEPENDENT_AMBULATORY_CARE_PROVIDER_SITE_OTHER): Payer: Managed Care, Other (non HMO) | Admitting: Cardiology

## 2013-05-26 ENCOUNTER — Encounter: Payer: Self-pay | Admitting: Cardiology

## 2013-05-26 VITALS — BP 142/90 | HR 89 | Ht 73.0 in | Wt 212.0 lb

## 2013-05-26 DIAGNOSIS — I471 Supraventricular tachycardia: Secondary | ICD-10-CM

## 2013-05-26 DIAGNOSIS — I472 Ventricular tachycardia: Secondary | ICD-10-CM

## 2013-05-26 DIAGNOSIS — I498 Other specified cardiac arrhythmias: Secondary | ICD-10-CM

## 2013-05-26 NOTE — Progress Notes (Signed)
HPI The patient presents for evaluation after CABG and mitral valve replacement. He presented late after myocardial infarction. 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 He ultimately required CABG with bioprosthetic mitral valve replacement. He had a complicated course with renal insufficiency. At the last visit I titrated his beta blocker slightly. He says he didn't have any presyncope or syncope. However, in retrospect he's had some visual disturbances with "speckles" that he seems to notice. His optometrist did not think this was a primary high problem but probably related to his cardiac issues. I did review his pre-CABG Dopplers and he had minimal plaque at that time. He is not describing orthostatic symptoms but he does report that the visual changes have been when he stands not when he is lying down. He doesn't notice any palpitations. He has had no chest pressure, neck or arm discomfort. He has remained quite active without any symptoms. He has had no swelling, PND or orthopnea.   No Known Allergies  Current Outpatient Prescriptions  Medication Sig Dispense Refill  . aspirin EC 81 MG EC tablet Take 1 tablet (81 mg total) by mouth daily.      . carvedilol (COREG) 6.25 MG tablet Take 1 tablet (6.25 mg total) by mouth 2 (two) times daily.  180 tablet  3  . ranitidine (ZANTAC) 150 MG tablet Take 150 mg by mouth 2 (two) times daily.      . simvastatin (ZOCOR) 10 MG tablet Take 10 mg by mouth at bedtime.       No current facility-administered medications for this visit.    Past Medical History  Diagnosis Date  . High cholesterol   . Hypertension   . Constipation   . Reflux   . Complication of anesthesia     "Hard time waking me up" after sedation after dental procedure  . CAD (coronary artery disease)     LAD 95% proximal stenosis, D1 50-60% stenosis, the circumflex 40% stenosis, RCA subtotal stenosis.  . Cardiomyopathy, ischemic     EF was 30-35% by echo  but 45-50% by cath  . Mitral regurgitation     Secondary to papillary muscle rupture    Past Surgical History  Procedure Laterality Date  . Stomach ulcer repair    . Tee without cardioversion N/A 02/04/2013    Procedure: TRANSESOPHAGEAL ECHOCARDIOGRAM (TEE);  Surgeon: Vesta Mixer, MD;  Location: [facility] ENDOSCOPY;  Service: Cardiovascular;  Laterality: N/A;  . Intraoperative transesophageal echocardiogram N/A 02/05/2013    Procedure: INTRAOPERATIVE TRANSESOPHAGEAL ECHOCARDIOGRAM;  Surgeon: [name], MD;  Location: [facility] OR;  Service: Open Heart Surgery;  Laterality: N/A;  . Endovein harvest of greater saphenous vein Right 02/05/2013    Procedure: ENDOVEIN HARVEST OF GREATER SAPHENOUS VEIN;  Surgeon: [name], MD;  Location: [facility] OR;  Service: Open Heart Surgery;  Laterality: Right;  . Patent foramen ovale closure N/A 02/05/2013    Procedure: PATENT FORAMEN OVALE CLOSURE;  Surgeon: [name], MD;  Location: [facility] OR;  Service: Open Heart Surgery;  Laterality: N/A;  . Mitral valve replacement (mvr)/coronary artery bypass grafting (cabg) N/A 02/05/2013    Procedure: MITRAL VALVE REPLACEMENT (MVR)/CORONARY ARTERY BYPASS GRAFTING (CABG);  Surgeon: [name], MD;  Location: [facility] OR;  Service: Open Heart Surgery;  Laterality: N/A;  x3 using right greater saphenous vein and left internal mammary.     ROS:  As stated in the HPI and negative for all other systems.   PHYSICAL EXAM BP 142/90  Pulse 89 GENERAL:  Well appearing HEENT:  Pupils equal round and reactive, fundi not visualized, oral mucosa unremarkable NECK:  No JVD, waveform within normal limits, carotid upstroke brisk and symmetric, no bruits, no thyromegaly LYMPHATICS:  No cervical, inguinal adenopathy LUNGS:  Slightly diminished breath sounds at the bases BACK:  No CVA tenderness CHEST:  Well healed sternotomy scar. HEART:  PMI not displaced or sustained,S1 and S2 within normal limits, no S3, no S4, no  clicks, no rubs, no murmurs ABD:  Flat, positive bowel sounds normal in frequency in pitch, no bruits, no rebound, no guarding, no midline pulsatile mass, no hepatomegaly, no splenomegaly EXT:  2 plus pulses throughout, no lower extremity edema, no cyanosis no clubbing SKIN:  No rashes no nodules NEURO:  Cranial nerves II through XII grossly intact, motor grossly intact throughout PSYCH:  Cognitively intact, oriented to person place and time   ASSESSMENT AND PLAN  CARDIOMYOPATHY:  With his slight visual disturbances I'm not going to titrate his medications. He thinks he might have started after the beta blocker was increased. I'm going to stop the Lasix. If his symptoms continue he might need to see a neurologist. If I did the opportunity I will titrate meds further. I will followup echocardiogram and if after maximum titration his EF is below 35% he will need to be considered for defibrillator.  MVR: This will be followed up in the future with repeat echocardiography probably after I see him in November.  CAD:  The patient is having no symptoms. No further cardiovascular testing is suggested. He will continue with risk reduction.

## 2013-05-26 NOTE — Patient Instructions (Addendum)
Stop Furosemide Continue all other medications as listed.  Follow up in 2 months with Dr Antoine Poche in [place].

## 2013-06-04 ENCOUNTER — Telehealth: Payer: Self-pay | Admitting: *Deleted

## 2013-06-04 NOTE — Telephone Encounter (Signed)
Spoke with patient and he informed nurse that he stopped the medication as suggested by MD at recent office visit to see if his symptoms of vision problems improved. Patient said that his vision problems have gotten worse since stopping the medication (furosemide). Patient said he saw an optometrist and was informed that his eyes looked good and that it was the medications he was on that is causing his problems. Nurse advised patient that he needed to call his PCP. Patient said he didn't have a family doctor at this time. Nurse reviewed medications he was taking at this time. See medication list for this information.

## 2013-06-04 NOTE — Telephone Encounter (Signed)
I doubt that this is related to the Coreg.  However, we can first reduce this dose to 3.125 mg BID.  I would suggest that he follow up with a nephrologist.  Call Mr. Jeansonne with the results

## 2013-06-04 NOTE — Telephone Encounter (Signed)
New Problem:  Pt states he was seen a couple weeks ago by Dr. Antoine Poche and the doctor stopped some of his meds. Pt also states the med change was suppose to help his eyes. Pt states his eye sight is still very bad. Pt says he cannot go back to work due to his poor vision. Pt would like to be advised.

## 2013-06-05 NOTE — Telephone Encounter (Signed)
Pt aware - he will decrease his medications as instructed and schedule to see his nephrologist.  He will call back if this doesn't help.

## 2013-06-11 NOTE — Telephone Encounter (Signed)
Pt states eye sight some better in the AM but in the evenings and at night he doesn't notice that much of a difference.  Instructed I will let Dr Antoine Poche know and if he wants to change any other medications I will call and let him know.  He is in agreement.

## 2013-06-19 NOTE — Telephone Encounter (Signed)
Reduce the Coreg to 3.125 mg bid.

## 2013-06-22 ENCOUNTER — Telehealth: Payer: Self-pay | Admitting: Cardiology

## 2013-06-22 MED ORDER — CARVEDILOL 6.25 MG PO TABS: 3.1250 mg | ORAL_TABLET | Freq: Two times a day (BID) | ORAL | Status: DC

## 2013-06-22 NOTE — Telephone Encounter (Signed)
Left message for patient to call the office regarding Dr. Jenene Slicker advice

## 2013-06-22 NOTE — Telephone Encounter (Signed)
Please see the previous telephone note

## 2013-06-22 NOTE — Telephone Encounter (Signed)
Pt and care giver aware to take 1/2 tablet or 3.125 mg twice a day.

## 2013-06-22 NOTE — Telephone Encounter (Signed)
New Message   Someone called him---Had an issue last week and it may have been a nurse following up with him

## 2013-07-01 ENCOUNTER — Ambulatory Visit: Payer: Managed Care, Other (non HMO) | Admitting: Cardiology

## 2013-07-22 ENCOUNTER — Ambulatory Visit: Payer: Managed Care, Other (non HMO) | Admitting: Cardiology

## 2013-07-22 ENCOUNTER — Encounter: Payer: Self-pay | Admitting: Cardiology

## 2013-07-22 ENCOUNTER — Encounter: Payer: Self-pay | Admitting: *Deleted

## 2013-07-22 ENCOUNTER — Ambulatory Visit (INDEPENDENT_AMBULATORY_CARE_PROVIDER_SITE_OTHER): Payer: Managed Care, Other (non HMO) | Admitting: Cardiology

## 2013-07-22 VITALS — BP 135/85 | HR 96 | Ht 73.0 in | Wt 221.0 lb

## 2013-07-22 DIAGNOSIS — I509 Heart failure, unspecified: Secondary | ICD-10-CM

## 2013-07-22 DIAGNOSIS — Z954 Presence of other heart-valve replacement: Secondary | ICD-10-CM

## 2013-07-22 DIAGNOSIS — I10 Essential (primary) hypertension: Secondary | ICD-10-CM

## 2013-07-22 MED ORDER — BISOPROLOL FUMARATE 5 MG PO TABS: 5.0000 mg | ORAL_TABLET | Freq: Every day | ORAL | Status: DC

## 2013-07-22 NOTE — Patient Instructions (Signed)
Please stop your Carvedilol. Start Bisoprolol 5 mg 1/2 tablet every afternoon. Continue all other medications as listed.  Your physician has requested that you have an echocardiogram. Echocardiography is a painless test that uses sound waves to create images of your heart. It provides your doctor with information about the size and shape of your heart and how well your heart's chambers and valves are working. This procedure takes approximately one hour. There are no restrictions for this procedure.  Follow up in 2 months with Dr Antoine Poche.

## 2013-07-22 NOTE — Progress Notes (Signed)
HPI The patient presents for evaluation after CABG and mitral valve replacement. He presented late after myocardial infarction. 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 He ultimately required CABG with bioprosthetic mitral valve replacement. He had a complicated course with renal insufficiency.  He was having some dizziness. We stopped his diuretic he does not see improvement in. He had some visual disturbances.  We also reduced his beta blocker. He says this is better. However, he still gets some right eye blurriness after he takes his morning carvedilol. Because he has to work he has only been taking this drug in the evening. He's not requiring any diuretics. He's not having any new shortness of breath, PND or orthopnea. She's not noticing any palpitations, presyncope or syncope. He's had no chest pressure.  No Known Allergies  Current Outpatient Prescriptions  Medication Sig Dispense Refill  . aspirin EC 81 MG EC tablet Take 1 tablet (81 mg total) by mouth daily.      . carvedilol (COREG) 6.25 MG tablet Take 0.5 tablets (3.125 mg total) by mouth 2 (two) times daily.  180 tablet  3  . ranitidine (ZANTAC) 150 MG tablet Take 150 mg by mouth 2 (two) times daily.       No current facility-administered medications for this visit.    Past Medical History  Diagnosis Date  . High cholesterol   . Hypertension   . Constipation   . Reflux   . Complication of anesthesia     "Hard time waking me up" after sedation after dental procedure  . CAD (coronary artery disease)     LAD 95% proximal stenosis, D1 50-60% stenosis, the circumflex 40% stenosis, RCA subtotal stenosis.  . Cardiomyopathy, ischemic     EF was 30-35% by echo but 45-50% by cath  . Mitral regurgitation     Secondary to papillary muscle rupture    Past Surgical History  Procedure Laterality Date  . Stomach ulcer repair    . Tee without cardioversion N/A 02/04/2013    Procedure: TRANSESOPHAGEAL  ECHOCARDIOGRAM (TEE);  Surgeon: Vesta Mixer, MD;  Location: [facility] ENDOSCOPY;  Service: Cardiovascular;  Laterality: N/A;  . Intraoperative transesophageal echocardiogram N/A 02/05/2013    Procedure: INTRAOPERATIVE TRANSESOPHAGEAL ECHOCARDIOGRAM;  Surgeon: [name], MD;  Location: [facility] OR;  Service: Open Heart Surgery;  Laterality: N/A;  . Endovein harvest of greater saphenous vein Right 02/05/2013    Procedure: ENDOVEIN HARVEST OF GREATER SAPHENOUS VEIN;  Surgeon: [name], MD;  Location: [facility] OR;  Service: Open Heart Surgery;  Laterality: Right;  . Patent foramen ovale closure N/A 02/05/2013    Procedure: PATENT FORAMEN OVALE CLOSURE;  Surgeon: [name], MD;  Location: [facility] OR;  Service: Open Heart Surgery;  Laterality: N/A;  . Mitral valve replacement (mvr)/coronary artery bypass grafting (cabg) N/A 02/05/2013    Procedure: MITRAL VALVE REPLACEMENT (MVR)/CORONARY ARTERY BYPASS GRAFTING (CABG);  Surgeon: [name], MD;  Location: [facility] OR;  Service: Open Heart Surgery;  Laterality: N/A;  x3 using right greater saphenous vein and left internal mammary.     ROS:  As stated in the HPI and negative for all other systems.   PHYSICAL EXAM BP 135/85  Pulse 96  Ht 6\' 1"  (1.854 m)  Wt 221 lb (100.245 kg)  BMI 29.16 kg/m2 GENERAL:  Well appearing NECK:  No JVD, waveform within normal limits, carotid upstroke brisk and symmetric, no bruits, no thyromegaly LUNGS:  Slightly diminished breath sounds at the bases BACK:  No CVA tenderness CHEST:  Well healed sternotomy scar. HEART:  PMI not displaced or sustained,S1 and S2 within normal limits, no S3, no S4, no clicks, no rubs, no murmurs ABD:  Flat, positive bowel sounds normal in frequency in pitch, no bruits, no rebound, no guarding, no midline pulsatile mass, no hepatomegaly, no splenomegaly EXT:  2 plus pulses throughout, no lower extremity edema, no cyanosis no clubbing   EKG:  Sinus rhythm, rate 94,  interventricular conduction delay, old inferior infarct, borderline first degree heart block, QTC prolonged, nonspecific T-wave changes. 07/22/2013  ASSESSMENT AND PLAN  CARDIOMYOPATHY:  I am going to try to switch him to bisoprolol in the afternoons a low dose to see if he does better with this without blurred vision. It is very clear that I am not going to titrate his medications much further beyond this.  I am going to order an echocardiogram to see if his ejection fraction has improved about 35%. If not we will need to be considered for defibrillator.  MVR: This will be followed up with the echo as above.  CAD:  The patient is having no symptoms. No further cardiovascular testing is suggested. He will continue with risk reduction.

## 2013-08-14 ENCOUNTER — Encounter: Payer: Self-pay | Admitting: Cardiovascular Disease

## 2013-08-14 ENCOUNTER — Ambulatory Visit (HOSPITAL_COMMUNITY): Payer: Managed Care, Other (non HMO) | Attending: Cardiology | Admitting: Cardiology

## 2013-08-14 DIAGNOSIS — I509 Heart failure, unspecified: Secondary | ICD-10-CM

## 2013-08-14 DIAGNOSIS — Z952 Presence of prosthetic heart valve: Secondary | ICD-10-CM | POA: Insufficient documentation

## 2013-08-14 DIAGNOSIS — I079 Rheumatic tricuspid valve disease, unspecified: Secondary | ICD-10-CM | POA: Insufficient documentation

## 2013-08-14 DIAGNOSIS — I2589 Other forms of chronic ischemic heart disease: Secondary | ICD-10-CM | POA: Insufficient documentation

## 2013-08-14 DIAGNOSIS — I10 Essential (primary) hypertension: Secondary | ICD-10-CM | POA: Insufficient documentation

## 2013-08-14 DIAGNOSIS — Z954 Presence of other heart-valve replacement: Secondary | ICD-10-CM

## 2013-08-14 NOTE — Progress Notes (Signed)
Echo performed. 

## 2013-08-17 ENCOUNTER — Encounter (HOSPITAL_COMMUNITY): Payer: Self-pay | Admitting: Cardiovascular Disease

## 2013-09-18 ENCOUNTER — Encounter (INDEPENDENT_AMBULATORY_CARE_PROVIDER_SITE_OTHER): Payer: Self-pay

## 2013-09-18 ENCOUNTER — Encounter: Payer: Self-pay | Admitting: Cardiology

## 2013-09-18 ENCOUNTER — Ambulatory Visit (INDEPENDENT_AMBULATORY_CARE_PROVIDER_SITE_OTHER): Payer: Managed Care, Other (non HMO) | Admitting: Cardiology

## 2013-09-18 VITALS — BP 130/73 | HR 77 | Ht 73.0 in | Wt 227.0 lb

## 2013-09-18 DIAGNOSIS — I4729 Other ventricular tachycardia: Secondary | ICD-10-CM

## 2013-09-18 DIAGNOSIS — I498 Other specified cardiac arrhythmias: Secondary | ICD-10-CM

## 2013-09-18 DIAGNOSIS — I10 Essential (primary) hypertension: Secondary | ICD-10-CM

## 2013-09-18 DIAGNOSIS — I472 Ventricular tachycardia, unspecified: Secondary | ICD-10-CM

## 2013-09-18 DIAGNOSIS — I2119 ST elevation (STEMI) myocardial infarction involving other coronary artery of inferior wall: Secondary | ICD-10-CM

## 2013-09-18 DIAGNOSIS — I471 Supraventricular tachycardia: Secondary | ICD-10-CM

## 2013-09-18 NOTE — Progress Notes (Signed)
HPI The patient presents for evaluation after CABG and mitral valve replacement. He presented late after myocardial infarction. 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 He ultimately required CABG with bioprosthetic mitral valve replacement. He had a complicated course with renal insufficiency.  He has had some postoperative visual disturbances that we thought might be made related. However, he has been told more recently that this was probably a complication of his heart attack not related to the medications. I don't know if this means it was thought he might have had a small stroke. I did check an echocardiogram recently and his ejection fraction remains around 30-35%.  Today he is not complaining of any shortness of breath, PND or orthopnea. He has no palpitations, presyncope or syncope. He has no weight gain or edema. He's walking stairs frequently.   No Known Allergies  Current Outpatient Prescriptions  Medication Sig Dispense Refill  . aspirin EC 81 MG EC tablet Take 1 tablet (81 mg total) by mouth daily.      . Aspirin-Acetaminophen-Caffeine (EXCEDRIN MIGRAINE PO) Take by mouth as needed.      . bisoprolol (ZEBETA) 5 MG tablet Take 1 tablet (5 mg total) by mouth daily.  30 tablet  6  . ranitidine (ZANTAC) 150 MG tablet Take 150 mg by mouth 2 (two) times daily.       No current facility-administered medications for this visit.    Past Medical History  Diagnosis Date  . High cholesterol   . Hypertension   . Constipation   . Reflux   . Complication of anesthesia     "Hard time waking me up" after sedation after dental procedure  . CAD (coronary artery disease)     LAD 95% proximal stenosis, D1 50-60% stenosis, the circumflex 40% stenosis, RCA subtotal stenosis.  . Cardiomyopathy, ischemic     EF was 30-35% by echo but 45-50% by cath  . Mitral regurgitation     Secondary to papillary muscle rupture    Past Surgical History  Procedure Laterality  Date  . Stomach ulcer repair    . Tee without cardioversion N/A 02/04/2013    Procedure: TRANSESOPHAGEAL ECHOCARDIOGRAM (TEE);  Surgeon: Vesta Mixer, MD;  Location: [facility] ENDOSCOPY;  Service: Cardiovascular;  Laterality: N/A;  . Intraoperative transesophageal echocardiogram N/A 02/05/2013    Procedure: INTRAOPERATIVE TRANSESOPHAGEAL ECHOCARDIOGRAM;  Surgeon: [name], MD;  Location: [facility] OR;  Service: Open Heart Surgery;  Laterality: N/A;  . Endovein harvest of greater saphenous vein Right 02/05/2013    Procedure: ENDOVEIN HARVEST OF GREATER SAPHENOUS VEIN;  Surgeon: [name], MD;  Location: [facility] OR;  Service: Open Heart Surgery;  Laterality: Right;  . Patent foramen ovale closure N/A 02/05/2013    Procedure: PATENT FORAMEN OVALE CLOSURE;  Surgeon: [name], MD;  Location: [facility] OR;  Service: Open Heart Surgery;  Laterality: N/A;  . Mitral valve replacement (mvr)/coronary artery bypass grafting (cabg) N/A 02/05/2013    Procedure: MITRAL VALVE REPLACEMENT (MVR)/CORONARY ARTERY BYPASS GRAFTING (CABG);  Surgeon: [name], MD;  Location: [facility] OR;  Service: Open Heart Surgery;  Laterality: N/A;  x3 using right greater saphenous vein and left internal mammary.     ROS:  As stated in the HPI and negative for all other systems.   PHYSICAL EXAM BP 130/73  Pulse 77  Ht 6\' 1"  (1.854 m)  Wt 227 lb (102.967 kg)  BMI 29.96 kg/m2 GENERAL:  Well appearing NECK:  No JVD, waveform within normal  limits, carotid upstroke brisk and symmetric, no bruits, no thyromegaly LUNGS:  Slightly diminished breath sounds at the bases BACK:  No CVA tenderness CHEST:  Well healed sternotomy scar. HEART:  PMI not displaced or sustained,S1 and S2 within normal limits, no S3, no S4, no clicks, no rubs, no murmurs ABD:  Flat, positive bowel sounds normal in frequency in pitch, no bruits, no rebound, no guarding, no midline pulsatile mass, no hepatomegaly, no splenomegaly EXT:  2 plus pulses  throughout, no lower extremity edema, no cyanosis no clubbing   ASSESSMENT AND PLAN  CARDIOMYOPATHY:  At this point we get to the LAD and ICD. We had a long conversation about this. He would prefer to try further med titration rather than getting an ICD at this point. I will increase his bisoprolol. If after further med titration in about 3 or 4 months his ejection fraction remains below 35% he probably and sent to an ICD.  MVR: He has stable valve prosthesis. No change in therapy is suggested.  CAD:  The patient is having no symptoms. No further cardiovascular testing is suggested. He will continue with risk reduction.

## 2013-09-18 NOTE — Patient Instructions (Signed)
Please take Bisoprolol 5 mg a day. Continue all other medications as listed.  Follow up in 1 month with Dr Antoine PocheHochrein.

## 2013-09-25 ENCOUNTER — Ambulatory Visit: Payer: Managed Care, Other (non HMO) | Admitting: Cardiology

## 2013-10-05 ENCOUNTER — Other Ambulatory Visit: Payer: Self-pay | Admitting: Cardiology

## 2013-10-23 ENCOUNTER — Encounter: Payer: Self-pay | Admitting: Cardiology

## 2013-10-23 ENCOUNTER — Ambulatory Visit (INDEPENDENT_AMBULATORY_CARE_PROVIDER_SITE_OTHER): Payer: Managed Care, Other (non HMO) | Admitting: Cardiology

## 2013-10-23 VITALS — BP 120/84 | HR 72 | Ht 73.0 in | Wt 233.0 lb

## 2013-10-23 DIAGNOSIS — I472 Ventricular tachycardia, unspecified: Secondary | ICD-10-CM

## 2013-10-23 DIAGNOSIS — I4729 Other ventricular tachycardia: Secondary | ICD-10-CM

## 2013-10-23 DIAGNOSIS — I471 Supraventricular tachycardia: Secondary | ICD-10-CM

## 2013-10-23 DIAGNOSIS — I498 Other specified cardiac arrhythmias: Secondary | ICD-10-CM

## 2013-10-23 MED ORDER — LISINOPRIL 2.5 MG PO TABS
2.5000 mg | ORAL_TABLET | Freq: Every day | ORAL | Status: DC
Start: 2013-10-23 — End: 2013-11-12

## 2013-10-23 NOTE — Patient Instructions (Signed)
Please start Lisinopril 2.5 mg a day Continue all other medications as listed.  Exercise 1/2 hour 5 days a week.  Follow up in 1 month.

## 2013-10-23 NOTE — Progress Notes (Signed)
HPI The patient presents for evaluation after CABG and mitral valve replacement. He presented late after myocardial infarction. 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 He ultimately required CABG with bioprosthetic mitral valve replacement. He had a complicated course with renal insufficiency.  He has had some postoperative visual disturbances that we thought might be made related. However, he has been told more recently that this was probably a complication of his heart attack not related to the medications. I don't know if this means it was thought he might have had a small stroke. I did check an echocardiogram recently and his ejection fraction remains around 30-35%.  Today he is not complaining of any shortness of breath or PND.   He has no palpitations, presyncope or syncope. He has no weight gain or edema. He's walking stairs frequently.  He has had to sleep with his head more propped up but he does not have symptoms of volume overload otherwise.     No Known Allergies  Current Outpatient Prescriptions  Medication Sig Dispense Refill  . aspirin EC 81 MG EC tablet Take 1 tablet (81 mg total) by mouth daily.      . Aspirin-Acetaminophen-Caffeine (EXCEDRIN MIGRAINE PO) Take by mouth as needed.      . bisoprolol (ZEBETA) 5 MG tablet Take 1 tablet (5 mg total) by mouth daily.  30 tablet  6  . ranitidine (ZANTAC) 150 MG tablet Take 150 mg by mouth 2 (two) times daily.      . simvastatin (ZOCOR) 10 MG tablet Take 1 tablet by mouth daily.       No current facility-administered medications for this visit.    Past Medical History  Diagnosis Date  . High cholesterol   . Hypertension   . Constipation   . Reflux   . Complication of anesthesia     "Hard time waking me up" after sedation after dental procedure  . CAD (coronary artery disease)     LAD 95% proximal stenosis, D1 50-60% stenosis, the circumflex 40% stenosis, RCA subtotal stenosis.  . Cardiomyopathy,  ischemic     EF was 30-35% by echo but 45-50% by cath  . Mitral regurgitation     Secondary to papillary muscle rupture    Past Surgical History  Procedure Laterality Date  . Stomach ulcer repair    . Tee without cardioversion N/A 02/04/2013    Procedure: TRANSESOPHAGEAL ECHOCARDIOGRAM (TEE);  Surgeon: Vesta Mixer, MD;  Location: [facility] ENDOSCOPY;  Service: Cardiovascular;  Laterality: N/A;  . Intraoperative transesophageal echocardiogram N/A 02/05/2013    Procedure: INTRAOPERATIVE TRANSESOPHAGEAL ECHOCARDIOGRAM;  Surgeon: [name], MD;  Location: [facility] OR;  Service: Open Heart Surgery;  Laterality: N/A;  . Endovein harvest of greater saphenous vein Right 02/05/2013    Procedure: ENDOVEIN HARVEST OF GREATER SAPHENOUS VEIN;  Surgeon: [name], MD;  Location: [facility] OR;  Service: Open Heart Surgery;  Laterality: Right;  . Patent foramen ovale closure N/A 02/05/2013    Procedure: PATENT FORAMEN OVALE CLOSURE;  Surgeon: [name], MD;  Location: [facility] OR;  Service: Open Heart Surgery;  Laterality: N/A;  . Mitral valve replacement (mvr)/coronary artery bypass grafting (cabg) N/A 02/05/2013    Procedure: MITRAL VALVE REPLACEMENT (MVR)/CORONARY ARTERY BYPASS GRAFTING (CABG);  Surgeon: [name], MD;  Location: [facility] OR;  Service: Open Heart Surgery;  Laterality: N/A;  x3 using right greater saphenous vein and left internal mammary.     ROS:  As stated in the HPI  and negative for all other systems.   PHYSICAL EXAM BP 120/84  Pulse 72  Ht 6\' 1"  (1.854 m)  Wt 233 lb (105.688 kg)  BMI 30.75 kg/m2 GENERAL:  Well appearing NECK:  No JVD, waveform within normal limits, carotid upstroke brisk and symmetric, no bruits, no thyromegaly LUNGS:  Slightly diminished breath sounds at the bases BACK:  No CVA tenderness CHEST:  Well healed sternotomy scar. HEART:  PMI not displaced or sustained,S1 and S2 within normal limits, no S3, no S4, no clicks, no rubs, no murmurs ABD:   Flat, positive bowel sounds normal in frequency in pitch, no bruits, no rebound, no guarding, no midline pulsatile mass, no hepatomegaly, no splenomegaly EXT:  2 plus pulses throughout, no lower extremity edema, no cyanosis no clubbing   ASSESSMENT AND PLAN  CARDIOMYOPATHY:  He would prefer to try further med titration rather than getting an ICD at this point. I will increase add lisinopril.  We are moving slowly because he has had blurred vision in the past. . If after further med titration in about 3 or 4 months his ejection fraction remains below 35% he probably and sent to an ICD.  MVR: He has stable valve prosthesis. No change in therapy is suggested.  CAD:  The patient is having no symptoms. No further cardiovascular testing is suggested. He will continue with risk reduction.

## 2013-11-01 ENCOUNTER — Inpatient Hospital Stay (HOSPITAL_COMMUNITY)
Admission: EM | Admit: 2013-11-01 | Discharge: 2013-11-13 | DRG: 286 | Disposition: A | Discharge status: Skilled Nursing Facility | Payer: Managed Care, Other (non HMO) | Attending: Cardiology | Admitting: Cardiology

## 2013-11-01 ENCOUNTER — Emergency Department (HOSPITAL_COMMUNITY): Payer: Managed Care, Other (non HMO)

## 2013-11-01 ENCOUNTER — Ambulatory Visit (HOSPITAL_COMMUNITY): Admit: 2013-11-01 | Payer: Managed Care, Other (non HMO) | Admitting: Cardiology

## 2013-11-01 ENCOUNTER — Encounter (HOSPITAL_COMMUNITY)
Admission: EM | Disposition: A | Discharge status: Skilled Nursing Facility | Payer: Self-pay | Source: Home / Self Care | Attending: Cardiology

## 2013-11-01 ENCOUNTER — Inpatient Hospital Stay (HOSPITAL_COMMUNITY): Payer: Managed Care, Other (non HMO)

## 2013-11-01 ENCOUNTER — Encounter (HOSPITAL_COMMUNITY): Payer: Self-pay | Admitting: *Deleted

## 2013-11-01 DIAGNOSIS — Z79899 Other long term (current) drug therapy: Secondary | ICD-10-CM

## 2013-11-01 DIAGNOSIS — R570 Cardiogenic shock: Secondary | ICD-10-CM

## 2013-11-01 DIAGNOSIS — I130 Hypertensive heart and chronic kidney disease with heart failure and stage 1 through stage 4 chronic kidney disease, or unspecified chronic kidney disease: Secondary | ICD-10-CM | POA: Diagnosis present

## 2013-11-01 DIAGNOSIS — I4901 Ventricular fibrillation: Secondary | ICD-10-CM

## 2013-11-01 DIAGNOSIS — N039 Chronic nephritic syndrome with unspecified morphologic changes: Secondary | ICD-10-CM

## 2013-11-01 DIAGNOSIS — N170 Acute kidney failure with tubular necrosis: Secondary | ICD-10-CM | POA: Diagnosis not present

## 2013-11-01 DIAGNOSIS — I469 Cardiac arrest, cause unspecified: Secondary | ICD-10-CM

## 2013-11-01 DIAGNOSIS — R197 Diarrhea, unspecified: Secondary | ICD-10-CM | POA: Diagnosis present

## 2013-11-01 DIAGNOSIS — K219 Gastro-esophageal reflux disease without esophagitis: Secondary | ICD-10-CM | POA: Diagnosis present

## 2013-11-01 DIAGNOSIS — Z7982 Long term (current) use of aspirin: Secondary | ICD-10-CM

## 2013-11-01 DIAGNOSIS — I10 Essential (primary) hypertension: Secondary | ICD-10-CM | POA: Diagnosis present

## 2013-11-01 DIAGNOSIS — Z6828 Body mass index (BMI) 28.0-28.9, adult: Secondary | ICD-10-CM

## 2013-11-01 DIAGNOSIS — Z8249 Family history of ischemic heart disease and other diseases of the circulatory system: Secondary | ICD-10-CM

## 2013-11-01 DIAGNOSIS — I509 Heart failure, unspecified: Secondary | ICD-10-CM

## 2013-11-01 DIAGNOSIS — E785 Hyperlipidemia, unspecified: Secondary | ICD-10-CM | POA: Diagnosis present

## 2013-11-01 DIAGNOSIS — I252 Old myocardial infarction: Secondary | ICD-10-CM

## 2013-11-01 DIAGNOSIS — E872 Acidosis, unspecified: Secondary | ICD-10-CM | POA: Diagnosis present

## 2013-11-01 DIAGNOSIS — Z820 Family history of epilepsy and other diseases of the nervous system: Secondary | ICD-10-CM

## 2013-11-01 DIAGNOSIS — I2589 Other forms of chronic ischemic heart disease: Secondary | ICD-10-CM | POA: Diagnosis present

## 2013-11-01 DIAGNOSIS — J690 Pneumonitis due to inhalation of food and vomit: Secondary | ICD-10-CM | POA: Diagnosis present

## 2013-11-01 DIAGNOSIS — J810 Acute pulmonary edema: Secondary | ICD-10-CM

## 2013-11-01 DIAGNOSIS — E7800 Pure hypercholesterolemia, unspecified: Secondary | ICD-10-CM | POA: Diagnosis present

## 2013-11-01 DIAGNOSIS — Z23 Encounter for immunization: Secondary | ICD-10-CM

## 2013-11-01 DIAGNOSIS — N189 Chronic kidney disease, unspecified: Secondary | ICD-10-CM | POA: Diagnosis present

## 2013-11-01 DIAGNOSIS — I2510 Atherosclerotic heart disease of native coronary artery without angina pectoris: Secondary | ICD-10-CM

## 2013-11-01 DIAGNOSIS — I472 Ventricular tachycardia, unspecified: Secondary | ICD-10-CM

## 2013-11-01 DIAGNOSIS — G931 Anoxic brain damage, not elsewhere classified: Secondary | ICD-10-CM | POA: Diagnosis present

## 2013-11-01 DIAGNOSIS — I5023 Acute on chronic systolic (congestive) heart failure: Secondary | ICD-10-CM

## 2013-11-01 DIAGNOSIS — Z951 Presence of aortocoronary bypass graft: Secondary | ICD-10-CM

## 2013-11-01 DIAGNOSIS — I5022 Chronic systolic (congestive) heart failure: Secondary | ICD-10-CM

## 2013-11-01 DIAGNOSIS — I2119 ST elevation (STEMI) myocardial infarction involving other coronary artery of inferior wall: Secondary | ICD-10-CM

## 2013-11-01 DIAGNOSIS — Z952 Presence of prosthetic heart valve: Secondary | ICD-10-CM

## 2013-11-01 DIAGNOSIS — D696 Thrombocytopenia, unspecified: Secondary | ICD-10-CM | POA: Diagnosis present

## 2013-11-01 DIAGNOSIS — Z954 Presence of other heart-valve replacement: Secondary | ICD-10-CM

## 2013-11-01 DIAGNOSIS — I4729 Other ventricular tachycardia: Secondary | ICD-10-CM | POA: Diagnosis not present

## 2013-11-01 DIAGNOSIS — N179 Acute kidney failure, unspecified: Secondary | ICD-10-CM

## 2013-11-01 DIAGNOSIS — E870 Hyperosmolality and hypernatremia: Secondary | ICD-10-CM | POA: Diagnosis not present

## 2013-11-01 DIAGNOSIS — E669 Obesity, unspecified: Secondary | ICD-10-CM | POA: Diagnosis present

## 2013-11-01 DIAGNOSIS — I498 Other specified cardiac arrhythmias: Secondary | ICD-10-CM | POA: Diagnosis not present

## 2013-11-01 DIAGNOSIS — IMO0002 Reserved for concepts with insufficient information to code with codable children: Secondary | ICD-10-CM | POA: Diagnosis not present

## 2013-11-01 DIAGNOSIS — J9600 Acute respiratory failure, unspecified whether with hypoxia or hypercapnia: Secondary | ICD-10-CM

## 2013-11-01 DIAGNOSIS — I255 Ischemic cardiomyopathy: Secondary | ICD-10-CM

## 2013-11-01 DIAGNOSIS — I2789 Other specified pulmonary heart diseases: Secondary | ICD-10-CM | POA: Diagnosis present

## 2013-11-01 HISTORY — PX: LEFT HEART CATHETERIZATION WITH CORONARY/GRAFT ANGIOGRAM: SHX5450

## 2013-11-01 LAB — BLOOD GAS, ARTERIAL
Acid-base deficit: 10.9 mmol/L — ABNORMAL HIGH (ref 0.0–2.0)
BICARBONATE: 15.2 meq/L — AB (ref 20.0–24.0)
Drawn by: 331761
FIO2: 1 %
MECHVT: 560 mL
O2 SAT: 99.2 %
PEEP: 5 cmH2O
PO2 ART: 261 mmHg — AB (ref 80.0–100.0)
Patient temperature: 91.4
RATE: 20 resp/min
TCO2: 16.4 mmol/L (ref 0–100)
pCO2 arterial: 31 mmHg — ABNORMAL LOW (ref 35.0–45.0)
pH, Arterial: 7.284 — ABNORMAL LOW (ref 7.350–7.450)

## 2013-11-01 LAB — BASIC METABOLIC PANEL
BUN: 20 mg/dL (ref 6–23)
BUN: 24 mg/dL — ABNORMAL HIGH (ref 6–23)
CO2: 13 mEq/L — ABNORMAL LOW (ref 19–32)
CO2: 16 meq/L — AB (ref 19–32)
Calcium: 8.4 mg/dL (ref 8.4–10.5)
Calcium: 7.4 mg/dL — ABNORMAL LOW (ref 8.4–10.5)
Chloride: 102 mEq/L (ref 96–112)
Chloride: 106 mEq/L (ref 96–112)
Creatinine, Ser: 1.46 mg/dL — ABNORMAL HIGH (ref 0.50–1.35)
Creatinine, Ser: 1.38 mg/dL — ABNORMAL HIGH (ref 0.50–1.35)
GFR calc Af Amer: 55 mL/min — ABNORMAL LOW (ref >90)
GFR calc non Af Amer: 51 mL/min — ABNORMAL LOW (ref >90)
GFR, EST AFRICAN AMERICAN: 59 mL/min — AB (ref >90)
GFR, EST NON AFRICAN AMERICAN: 48 mL/min — AB (ref >90)
GLUCOSE: 185 mg/dL — AB (ref 70–99)
Glucose, Bld: 216 mg/dL — ABNORMAL HIGH (ref 70–99)
POTASSIUM: 4.2 meq/L (ref 3.7–5.3)
Potassium: 3.4 mEq/L — ABNORMAL LOW (ref 3.7–5.3)
SODIUM: 142 meq/L (ref 137–147)
SODIUM: 139 meq/L (ref 137–147)

## 2013-11-01 LAB — PROTIME-INR
INR: 1.14 (ref 0.00–1.49)
PROTHROMBIN TIME: 14.4 s (ref 11.6–15.2)

## 2013-11-01 LAB — POCT I-STAT, CHEM 8
BUN: 26 mg/dL — ABNORMAL HIGH (ref 6–23)
Calcium, Ion: 1.11 mmol/L — ABNORMAL LOW (ref 1.13–1.30)
Chloride: 106 mEq/L (ref 96–112)
Creatinine, Ser: 1.4 mg/dL — ABNORMAL HIGH (ref 0.50–1.35)
Glucose, Bld: 217 mg/dL — ABNORMAL HIGH (ref 70–99)
HEMATOCRIT: 47 % (ref 39.0–52.0)
Hemoglobin: 16 g/dL (ref 13.0–17.0)
POTASSIUM: 3.7 meq/L (ref 3.7–5.3)
SODIUM: 142 meq/L (ref 137–147)
TCO2: 20 mmol/L (ref 0–100)

## 2013-11-01 LAB — TRIGLYCERIDES: TRIGLYCERIDES: 76 mg/dL (ref <150)

## 2013-11-01 LAB — CBC
HCT: 42.4 % (ref 39.0–52.0)
Hemoglobin: 14 g/dL (ref 13.0–17.0)
MCH: 30.9 pg (ref 26.0–34.0)
MCHC: 33 g/dL (ref 30.0–36.0)
MCV: 93.6 fL (ref 78.0–100.0)
PLATELETS: 166 10*3/uL (ref 150–400)
RBC: 4.53 MIL/uL (ref 4.22–5.81)
RDW: 13.8 % (ref 11.5–15.5)
WBC: 16.1 10*3/uL — AB (ref 4.0–10.5)

## 2013-11-01 LAB — CBG MONITORING, ED: Glucose-Capillary: 162 mg/dL — ABNORMAL HIGH (ref 70–99)

## 2013-11-01 LAB — LACTIC ACID, PLASMA: LACTIC ACID, VENOUS: 3.4 mmol/L — AB (ref 0.5–2.2)

## 2013-11-01 LAB — APTT: APTT: 25 s (ref 24–37)

## 2013-11-01 LAB — MRSA PCR SCREENING: MRSA BY PCR: NEGATIVE

## 2013-11-01 SURGERY — LEFT HEART CATHETERIZATION WITH CORONARY/GRAFT ANGIOGRAM
Anesthesia: LOCAL

## 2013-11-01 MED ORDER — FENTANYL CITRATE 0.05 MG/ML IJ SOLN
INTRAMUSCULAR | Status: AC
  Administered 2013-11-01: 50 ug via INTRAVENOUS
  Filled 2013-11-01: qty 2

## 2013-11-01 MED ORDER — AMIODARONE HCL IN DEXTROSE 360-4.14 MG/200ML-% IV SOLN
60.0000 mg/h | INTRAVENOUS | Status: DC
  Filled 2013-11-01: qty 200

## 2013-11-01 MED ORDER — LIDOCAINE HCL (PF) 1 % IJ SOLN
INTRAMUSCULAR | Status: AC
  Filled 2013-11-01: qty 30

## 2013-11-01 MED ORDER — AMIODARONE HCL IN DEXTROSE 360-4.14 MG/200ML-% IV SOLN
30.0000 mg/h | INTRAVENOUS | Status: DC
  Administered 2013-11-02 – 2013-11-05 (×8): 30 mg/h via INTRAVENOUS
  Filled 2013-11-01 (×18): qty 200

## 2013-11-01 MED ORDER — PANTOPRAZOLE SODIUM 40 MG IV SOLR
40.0000 mg | INTRAVENOUS | Status: DC
  Administered 2013-11-01 – 2013-11-05 (×5): 40 mg via INTRAVENOUS
  Filled 2013-11-01 (×6): qty 40

## 2013-11-01 MED ORDER — ARTIFICIAL TEARS OP OINT
TOPICAL_OINTMENT | Freq: Three times a day (TID) | OPHTHALMIC | Status: DC
  Administered 2013-11-01 – 2013-11-02 (×3): via OPHTHALMIC
  Administered 2013-11-02 – 2013-11-03 (×2): 1 via OPHTHALMIC
  Filled 2013-11-01: qty 3.5

## 2013-11-01 MED ORDER — SODIUM CHLORIDE 0.9 % IV SOLN
3.0000 g | Freq: Four times a day (QID) | INTRAVENOUS | Status: AC
  Administered 2013-11-01 – 2013-11-05 (×17): 3 g via INTRAVENOUS
  Filled 2013-11-01 (×17): qty 3

## 2013-11-01 MED ORDER — HEPARIN (PORCINE) IN NACL 2-0.9 UNIT/ML-% IJ SOLN
INTRAMUSCULAR | Status: AC
  Filled 2013-11-01: qty 1000

## 2013-11-01 MED ORDER — HEPARIN SODIUM (PORCINE) 5000 UNIT/ML IJ SOLN
5000.0000 [IU] | Freq: Three times a day (TID) | INTRAMUSCULAR | Status: DC
  Administered 2013-11-02 – 2013-11-13 (×34): 5000 [IU] via SUBCUTANEOUS
  Filled 2013-11-01 (×39): qty 1

## 2013-11-01 MED ORDER — SODIUM CHLORIDE 0.9 % IV SOLN
25.0000 ug/h | INTRAVENOUS | Status: DC
  Administered 2013-11-01: 100 ug/h via INTRAVENOUS
  Administered 2013-11-02 – 2013-11-03 (×2): 150 ug/h via INTRAVENOUS
  Filled 2013-11-01 (×3): qty 50

## 2013-11-01 MED ORDER — LIDOCAINE HCL (CARDIAC) 20 MG/ML IV SOLN
INTRAVENOUS | Status: AC
  Filled 2013-11-01: qty 5

## 2013-11-01 MED ORDER — SODIUM CHLORIDE 0.9 % IV SOLN
2000.0000 mL | Freq: Once | INTRAVENOUS | Status: AC
  Administered 2013-11-01: 2000 mL via INTRAVENOUS

## 2013-11-01 MED ORDER — AMIODARONE HCL IN DEXTROSE 360-4.14 MG/200ML-% IV SOLN
60.0000 mg/h | Freq: Once | INTRAVENOUS | Status: AC
  Administered 2013-11-01: 60 mg/h via INTRAVENOUS

## 2013-11-01 MED ORDER — CISATRACURIUM BOLUS VIA INFUSION
0.1000 mg/kg | Freq: Once | INTRAVENOUS | Status: DC
  Filled 2013-11-01: qty 11

## 2013-11-01 MED ORDER — CISATRACURIUM BOLUS VIA INFUSION
0.0500 mg/kg | INTRAVENOUS | Status: DC | PRN
  Filled 2013-11-01: qty 6

## 2013-11-01 MED ORDER — NOREPINEPHRINE BITARTRATE 1 MG/ML IJ SOLN
2.0000 ug/min | INTRAMUSCULAR | Status: DC
  Administered 2013-11-01: 2 ug/min via INTRAVENOUS
  Filled 2013-11-01: qty 4

## 2013-11-01 MED ORDER — SODIUM CHLORIDE 0.9 % IV SOLN
1.0000 ug/kg/min | INTRAVENOUS | Status: DC
  Administered 2013-11-01 – 2013-11-02 (×2): 1 ug/kg/min via INTRAVENOUS
  Filled 2013-11-01 (×2): qty 20

## 2013-11-01 MED ORDER — BIOTENE DRY MOUTH MT LIQD
15.0000 mL | Freq: Four times a day (QID) | OROMUCOSAL | Status: DC
  Administered 2013-11-02 – 2013-11-08 (×28): 15 mL via OROMUCOSAL

## 2013-11-01 MED ORDER — AMIODARONE LOAD VIA INFUSION
150.0000 mg | Freq: Once | INTRAVENOUS | Status: DC
Start: 2013-11-01 — End: 2013-11-01
  Filled 2013-11-01: qty 83.34

## 2013-11-01 MED ORDER — CHLORHEXIDINE GLUCONATE 0.12 % MT SOLN
15.0000 mL | Freq: Two times a day (BID) | OROMUCOSAL | Status: DC
  Administered 2013-11-01 – 2013-11-08 (×13): 15 mL via OROMUCOSAL
  Filled 2013-11-01 (×13): qty 15

## 2013-11-01 MED ORDER — ROCURONIUM BROMIDE 50 MG/5ML IV SOLN
INTRAVENOUS | Status: AC
  Filled 2013-11-01: qty 2

## 2013-11-01 MED ORDER — INSULIN ASPART 100 UNIT/ML ~~LOC~~ SOLN
0.0000 [IU] | SUBCUTANEOUS | Status: DC
  Administered 2013-11-01: 2 [IU] via SUBCUTANEOUS
  Administered 2013-11-02 (×2): 1 [IU] via SUBCUTANEOUS
  Administered 2013-11-02: 2 [IU] via SUBCUTANEOUS
  Administered 2013-11-02 – 2013-11-04 (×6): 1 [IU] via SUBCUTANEOUS
  Administered 2013-11-04: 2 [IU] via SUBCUTANEOUS
  Administered 2013-11-05: 1 [IU] via SUBCUTANEOUS
  Administered 2013-11-07: 13:00:00 via SUBCUTANEOUS

## 2013-11-01 MED ORDER — ATROPINE SULFATE 0.1 MG/ML IJ SOLN
INTRAMUSCULAR | Status: AC
  Filled 2013-11-01: qty 10

## 2013-11-01 MED ORDER — SUCCINYLCHOLINE CHLORIDE 20 MG/ML IJ SOLN
INTRAMUSCULAR | Status: AC
  Administered 2013-11-01: 130 mg
  Filled 2013-11-01: qty 1

## 2013-11-01 MED ORDER — AMIODARONE HCL IN DEXTROSE 360-4.14 MG/200ML-% IV SOLN
60.0000 mg/h | INTRAVENOUS | Status: AC
  Administered 2013-11-01: 60 mg/h via INTRAVENOUS
  Filled 2013-11-01: qty 200

## 2013-11-01 MED ORDER — NITROGLYCERIN 0.2 MG/ML ON CALL CATH LAB
INTRAVENOUS | Status: AC
  Filled 2013-11-01: qty 1

## 2013-11-01 MED ORDER — DEXTROSE 5 % IV SOLN
150.0000 mg | INTRAVENOUS | Status: DC | PRN
  Administered 2013-11-01: 150 mg via INTRAVENOUS

## 2013-11-01 MED ORDER — SODIUM CHLORIDE 0.9 % IV SOLN
INTRAVENOUS | Status: AC
  Administered 2013-11-01: 22:00:00 via INTRAVENOUS

## 2013-11-01 MED ORDER — AMIODARONE HCL IN DEXTROSE 360-4.14 MG/200ML-% IV SOLN: 30.0000 mg/h | INTRAVENOUS | Status: DC

## 2013-11-01 MED ORDER — SODIUM CHLORIDE 0.9 % IV SOLN
1.0000 mg/h | INTRAVENOUS | Status: DC
  Administered 2013-11-01: 1 mg/h via INTRAVENOUS
  Filled 2013-11-01: qty 10

## 2013-11-01 MED ORDER — NOREPINEPHRINE BITARTRATE 1 MG/ML IJ SOLN
INTRAMUSCULAR | Status: AC
  Filled 2013-11-01: qty 4

## 2013-11-01 MED ORDER — POTASSIUM CHLORIDE 10 MEQ/50ML IV SOLN
10.0000 meq | INTRAVENOUS | Status: AC
  Administered 2013-11-02 (×4): 10 meq via INTRAVENOUS
  Filled 2013-11-01 (×3): qty 50

## 2013-11-01 MED ORDER — HEPARIN SODIUM (PORCINE) 1000 UNIT/ML IJ SOLN
INTRAMUSCULAR | Status: AC
  Filled 2013-11-01: qty 1

## 2013-11-01 MED ORDER — PROPOFOL 10 MG/ML IV EMUL
0.0000 ug/kg/min | INTRAVENOUS | Status: DC
  Administered 2013-11-01 – 2013-11-02 (×3): 20 ug/kg/min via INTRAVENOUS
  Filled 2013-11-01 (×3): qty 100

## 2013-11-01 MED ORDER — ETOMIDATE 2 MG/ML IV SOLN
INTRAVENOUS | Status: AC
  Administered 2013-11-01: 30 mg
  Filled 2013-11-01: qty 20

## 2013-11-01 MED ORDER — FENTANYL CITRATE 0.05 MG/ML IJ SOLN
50.0000 ug | Freq: Once | INTRAMUSCULAR | Status: AC
  Administered 2013-11-01: 50 ug via INTRAVENOUS

## 2013-11-01 MED ORDER — MIDAZOLAM HCL 2 MG/2ML IJ SOLN
INTRAMUSCULAR | Status: AC
  Filled 2013-11-01: qty 2

## 2013-11-01 MED ORDER — ASPIRIN 300 MG RE SUPP
300.0000 mg | RECTAL | Status: AC
  Administered 2013-11-01: 300 mg via RECTAL
  Filled 2013-11-01: qty 1

## 2013-11-01 MED ORDER — SODIUM CHLORIDE 0.9 % IV SOLN: 2000.0000 mL | Freq: Once | INTRAVENOUS | Status: DC

## 2013-11-01 MED ORDER — SODIUM CHLORIDE 0.9 % IV SOLN: 1.0000 ug/kg/min | INTRAVENOUS | Status: DC

## 2013-11-01 MED ORDER — NOREPINEPHRINE BITARTRATE 1 MG/ML IJ SOLN: 0.5000 ug/min | INTRAMUSCULAR | Status: DC

## 2013-11-01 NOTE — ED Notes (Signed)
Dr Tyson AliasFeinstein to bedside at CT and wants all drips started

## 2013-11-01 NOTE — Procedures (Addendum)
Central Venous Catheter Insertion Procedure Note Lonnie BerkshireRobert L [name] [id number] February 20, 1946  Procedure: Insertion of Central Venous Catheter Indications: Assessment of intravascular volume, Drug and/or fluid administration and Frequent blood sampling  Procedure Details Consent: Risks of procedure as well as the alternatives and risks of each were explained to the (patient/caregiver).  Consent for procedure obtained. Time Out: Verified patient identification, verified procedure, site/side was marked, verified correct patient position, special equipment/implants available, medications/allergies/relevent history reviewed, required imaging and test results available.  Performed  Maximum sterile technique was used including antiseptics, cap, gloves, gown, hand hygiene, mask and sheet. Skin prep: Chlorhexidine; local anesthetic administered A antimicrobial bonded/coated triple lumen catheter was placed in the left internal jugular vein using the Seldinger technique.  Evaluation Blood flow good Complications: No apparent complications Patient did tolerate procedure well. Chest X-ray ordered to verify placement.  CXR: pending.  Lonnie [name]. 11/01/2013, 8:45 PM  I used ultrasound to locate and access the vein/artery.

## 2013-11-01 NOTE — Progress Notes (Signed)
Chaplain followed up with pt of family.  Family was distressed by the report from the catheterization.  Chaplain offered support through compassionate conversation empathic listening.     11/01/13 2100  Clinical Encounter Type  Visited With Family  Visit Type Follow-up    Rulon Abideavid B Sherrod, chaplain pager [phone]

## 2013-11-01 NOTE — ED Notes (Signed)
 30mg  etomidate given RAC

## 2013-11-01 NOTE — ED Notes (Signed)
Pt to cath lab; cath lab aware that pt has not had arctic sun pads or temp foley placed d/t tx for CT scan

## 2013-11-01 NOTE — Progress Notes (Signed)
Pt. Was transported from the CATH lab to 2H01 without any complications.

## 2013-11-01 NOTE — ED Notes (Signed)
Dr jordan at bedside  

## 2013-11-01 NOTE — Procedures (Signed)
Arterial Catheter Insertion Procedure Note Bethann BerkshireRobert L Madrid [id number] [birth year]  Procedure: Insertion of Arterial Catheter  Indications: Blood pressure monitoring and Frequent blood sampling  Procedure Details Consent: Unable to obtain consent because of emergent medical necessity. Pt. Intubated on ventilator.  Time Out: Verified patient identification, verified procedure, site/side was marked, verified correct patient position, special equipment/implants available, medications/allergies/relevent history reviewed, required imaging and test results available.  Performed  Maximum sterile technique was used including cap, gloves, gown, hand hygiene, mask and sheet. Skin prep: Chlorhexidine; local anesthetic administered 20 gauge catheter was inserted into right radial artery using the Seldinger technique.  Evaluation Blood flow good; BP tracing good. Complications: No apparent complications.  Assisted by Janece CanterburyFrancis Almonor, RRT  Carlynn Spryobb, Deyanna Mctier L 11/01/2013

## 2013-11-01 NOTE — ED Notes (Signed)
Pt to CT scan via stretcher on monitor and vent with EMT, RN, RT and Dr SwazilandJordan

## 2013-11-01 NOTE — ED Notes (Signed)
Per ems- pt was down for 5-7 min. Family started CPR- 2-3 minutes; shocked twice for vtach; placed king airway and shocked once for vtach- pulses back; synco-cardioversion; 2 epi given; CPR total 7 minutes

## 2013-11-01 NOTE — ED Notes (Signed)
X-ray at bedside

## 2013-11-01 NOTE — CV Procedure (Signed)
   Cardiac Catheterization Procedure Note  Name: Lonnie BerkshireRobert L [name] MRN: [id number] DOB: 06/29/1946  Procedure: Left Heart Cath, Selective Coronary Angiography, LIMA angiography, SVG angiography, LV angiography  Indication: 68 yo WM with history of CAD presents with out of hospital cardiac arrest with VT/VF. Post resuscitation Ecg showed increased ST elevation in the inferior leads with ST depression anteriorly. Of note Ecg 2 weeks prior showed Q waves inferiorly with residual mild ST elevation. The patient has a prior history of CABG with MV replacement for prior inferior MI with ruptured papillary muscle. Last EF was 30-35%. Arrival to the cath lab was delayed due to need to intubate the patient in the ED and obtain CT of the head. On arrival to the cath lab he was in cardiogenic shock with BP in the 60s systolic. I personally provided critical care from his arrival in the ED until after his cardiac cath- 2 hours.   Procedural details: On arrival to the cath lab we gave him a bolus of saline for his hypotension. IV levophed was initiated with stabilization of his blood pressure. He was mechanically ventilated. The right groin was prepped, draped, and anesthetized with 1% lidocaine. Using modified Seldinger technique, a 6 French sheath was introduced into the right femoral artery. Standard Judkins catheters were used for coronary and graft angiography and left ventriculography. Catheter exchanges were performed over a guidewire. There were no immediate procedural complications. The patient was transferred to the post catheterization recovery area for further monitoring.  Procedural Findings: Hemodynamics:  AO 112/68 mean of 86 mm Hg LV 112/25 mm Hg   Coronary angiography: Coronary dominance: right  Left mainstem: Normal.  Left anterior descending (LAD): The LAD has a high grade stenosis proximally and the vessel fills by the LIMA graft. The first diagonal is severely disease proximally and fills  by the SVG graft.  Left circumflex (LCx): The LCx gives rise to 2 large OM branches then terminates in a smaller OM 3. Prior to the second OM there is an eccentric 70-80% stenosis.  Right coronary artery (RCA): The RCA is a large ectatic vessel proximally. It is diffusely diseased at the crux and occluded distally.  SVG to the diagonal is patent. In the mid LAD after graft insertion there is a focal 70% stenosis.  SVG to the PDA is patent and fills a large PLOM branch. There is a focal 50% ostial stenosis of the SVG.  LIMA graft to the LAD is widely patent.  Left ventriculography: Left ventricular systolic function is markedly abnormal. There is severe enlargement of the LV with akinesis and aneurysm of the entire inferior wall. There is severe global hypokinesis. LVEF is estimated at 15-20%. There is no significant mitral regurgitation, a bioprosthetic mitral  valve is in place.  Final Conclusions:   1. 3 vessel obstructive CAD. 2. Patent grafts including LIMA to the LAD, SVG to the diagonal, and SVG to the PDA.  3. Severe LV dysfunction.   Recommendations: There is no culprit lesion to explain his cardiac arrest. There is moderate obstructive disease in the diagonal and second OM but I think his arrest was predominantly related to arrhythmia and pump failure. Will transport to ICU and continue supportive therapy with ventilator, pressors, and hypothermia protocol.  Theron Aristaeter [facility] Endoscopy LLCJordanMD,FACC 11/01/2013, 7:20 PM

## 2013-11-01 NOTE — ED Notes (Signed)
Pt to cath lab via stretcher on vent, monitor with Dr SwazilandJordan, RT, RN and EMT

## 2013-11-01 NOTE — ED Notes (Signed)
Dr Manus Gunningrancour updating family on poc

## 2013-11-01 NOTE — Progress Notes (Signed)
ANTIBIOTIC CONSULT NOTE - INITIAL  Pharmacy Consult for unasyn Indication: pneumonia  No Known Allergies  Patient Measurements: Height: 6\' 1"  (185.4 cm) Weight: 235 lb 0.2 oz (106.6 kg) IBW/kg (Calculated) : 79.9 Adjusted Body Weight:   Vital Signs: Temp: 95 F (35 C) (02/22 2030) Temp src: Core (Comment) (02/22 2030) BP: 176/103 mmHg (02/22 2030) Pulse Rate: 55 (02/22 2030) Intake/Output from previous day:   Intake/Output from this shift:    Labs:  Recent Labs  11/01/13 1737  WBC 16.1*  HGB 14.0  PLT 166  CREATININE 1.46*   Estimated Creatinine Clearance: 62.1 ml/min (by C-G formula based on Cr of 1.46). No results found for this basename: VANCOTROUGH, VANCOPEAK, VANCORANDOM, GENTTROUGH, GENTPEAK, GENTRANDOM, TOBRATROUGH, TOBRAPEAK, TOBRARND, AMIKACINPEAK, AMIKACINTROU, AMIKACIN,  in the last 72 hours   Microbiology: No results found for this or any previous visit (from the past 720 hour(s)).  Medical History: Past Medical History  Diagnosis Date  . High cholesterol   . Hypertension   . Constipation   . Reflux   . Complication of anesthesia     "Hard time waking me up" after sedation after dental procedure  . CAD (coronary artery disease)     LAD 95% proximal stenosis, D1 50-60% stenosis, the circumflex 40% stenosis, RCA subtotal stenosis.  . Cardiomyopathy, ischemic     EF was 30-35% by echo but 45-50% by cath  . Mitral regurgitation     Secondary to papillary muscle rupture    Medications:  Prescriptions prior to admission  Medication Sig Dispense Refill  . aspirin EC 81 MG EC tablet Take 1 tablet (81 mg total) by mouth daily.      . Aspirin-Acetaminophen-Caffeine (EXCEDRIN MIGRAINE PO) Take by mouth as needed.      . bisoprolol (ZEBETA) 5 MG tablet Take 1 tablet (5 mg total) by mouth daily.  30 tablet  6  . lisinopril (PRINIVIL,ZESTRIL) 2.5 MG tablet Take 1 tablet (2.5 mg total) by mouth daily.  90 tablet  3  . ranitidine (ZANTAC) 150 MG tablet Take  150 mg by mouth 2 (two) times daily.      . simvastatin (ZOCOR) 10 MG tablet Take 1 tablet by mouth daily.       Scheduled:  . [START ON 11/02/2013] antiseptic oral rinse  15 mL Mouth Rinse QID  . aspirin  300 mg Rectal NOW  . chlorhexidine  15 mL Mouth Rinse BID  . cisatracurium  0.1 mg/kg Intravenous Once  . [START ON 11/02/2013] heparin  5,000 Units Subcutaneous 3 times per day  . insulin aspart  0-9 Units Subcutaneous 6 times per day  . lidocaine (cardiac) 100 mg/625ml      . pantoprazole (PROTONIX) IV  40 mg Intravenous Q24H  . rocuronium       Assessment: 68 yo who was s/p arrest. He is now intubated. Unasyn is started for worries about aspiration.   Plan:   Unasyn 3g IV q6

## 2013-11-01 NOTE — ED Notes (Signed)
 130mg  succ given RAC

## 2013-11-01 NOTE — Consult Note (Signed)
Cardiology Consult Note  Admit date: 11/01/2013 Name: Lonnie [name] 68 y.o.  male DOB:  [birth year] MRN:  [id number]  Today's date:  11/01/2013  Referring Physician:   Dr. Tyson AliasFeinstein  IMPRESSIONS: 1. Out of hospital cardiac arrest with prolonged time for resuscitation likely appears to be a primary arrhythmic event 2. Coronary artery disease with previous inferior infarction and previous bypass grafting and mitral valve for placement 3. History of atrial valve or placement for ischemic mitral regurgitation 4. Anoxic encephalopathy 5. Cardiogenic shock 6. Hypertensive heart disease 7. Hyperlipidemia  RECOMMENDATION: 1. Continue hypothermia protocol with management of blood pressure per critical care medicine 2. Repeat echocardiogram as he had severely depressed LV function at catheterization 3. Further recommendations concerning AICD depend on whether he has recovery from his cardiac arrest neurologically. 4. Continue beta blockers per tube. If he develops further arrhythmias consider intravenous amiodarone.  HISTORY: This 68 year old male has a history of coronary artery disease with a late presentation of an inferior myocardial infarction in May of 2014. He was found to have a partially flail posterior leaflet of the mitral valve with severe mitral regurgitation. He was estimated to have fairly normal LV systolic function initially but postoperatively developed ventricular tachycardia requiring amiodarone. His ejection fraction was around 30-35% following surgery. He required amiodarone for some time in the hospital and was sent home on low-dose of beta blocker and low-dose diuretic and was not discharged on amiodarone. He had difficulty tolerating Corag and was later placed on bisoprolol. An echocardiogram done in December showed an ejection fraction 30-35% and when seen by his cardiologist a note was made that he would prefer to try further med titration was rather than getting an  AICD.  He had a cardiac arrest earlier today is found to be sitting in a chair unresponsive by his wife and found in ventricular fibrillation. EMS was called resuscitated him and he initially had a wide complex rhythm with a new right bundle branch block and was found to have new inferior ST elevation. He was taken to the catheterization laboratory by Dr. SwazilandJordan who did not find anything that could be revascularized. He was placed on the hypothermia protocol and is currently unresponsive.  Past Medical History  Diagnosis Date  . High cholesterol   . Hypertension   . Constipation   . Reflux   . Complication of anesthesia     "Hard time waking me up" after sedation after dental procedure  . CAD (coronary artery disease)     LAD 95% proximal stenosis, D1 50-60% stenosis, the circumflex 40% stenosis, RCA subtotal stenosis.  . Cardiomyopathy, ischemic     EF was 30-35% by echo but 45-50% by cath  . Mitral regurgitation     Secondary to papillary muscle rupture      Past Surgical History  Procedure Laterality Date  . Stomach ulcer repair    . Tee without cardioversion N/A 02/04/2013    Procedure: TRANSESOPHAGEAL ECHOCARDIOGRAM (TEE);  Surgeon: Vesta MixerPhilip J Nahser, MD;  [facility];  Service: Cardiovascular;  Laterality: N/A;  . Intraoperative transesophageal echocardiogram N/A 02/05/2013    Procedure: INTRAOPERATIVE TRANSESOPHAGEAL ECHOCARDIOGRAM;  Surgeon: Loreli SlotSteven C Hendrickson, MD;  [facility];  Service: Open Heart Surgery;  Laterality: N/A;  . Endovein harvest of greater saphenous vein Right 02/05/2013    Procedure: ENDOVEIN HARVEST OF GREATER SAPHENOUS VEIN;  Surgeon: Loreli SlotSteven C Hendrickson, MD;  [facility];  Service: Open Heart Surgery;  Laterality: Right;  . Patent foramen ovale closure  N/A 02/05/2013    Procedure: PATENT FORAMEN OVALE CLOSURE;  Surgeon: [name], MD;  Location: [facility] OR;  Service: Open Heart Surgery;  Laterality: N/A;  . Mitral valve replacement  (mvr)/coronary artery bypass grafting (cabg) N/A 02/05/2013    Procedure: MITRAL VALVE REPLACEMENT (MVR)/CORONARY ARTERY BYPASS GRAFTING (CABG);  Surgeon: [name], MD;  Location: [facility] OR;  Service: Open Heart Surgery;  Laterality: N/A;  x3 using right greater saphenous vein and left internal mammary.      Allergies:  has No Known Allergies.   Medications: Prior to Admission medications   Medication Sig Start Date End Date Taking? Authorizing Provider  aspirin EC 81 MG EC tablet Take 1 tablet (81 mg total) by mouth daily. 02/17/13   Wayne E Gold, PA-C  Aspirin-Acetaminophen-Caffeine (EXCEDRIN MIGRAINE PO) Take by mouth as needed.    Historical Provider, MD  bisoprolol (ZEBETA) 5 MG tablet Take 1 tablet (5 mg total) by mouth daily. 07/22/13   Rollene Rotunda, MD  lisinopril (PRINIVIL,ZESTRIL) 2.5 MG tablet Take 1 tablet (2.5 mg total) by mouth daily. 10/23/13   Rollene Rotunda, MD  ranitidine (ZANTAC) 150 MG tablet Take 150 mg by mouth 2 (two) times daily.    Historical Provider, MD  simvastatin (ZOCOR) 10 MG tablet Take 1 tablet by mouth daily. 09/23/13   Historical Provider, MD    Family History: Family Status  Relation Status Death Age  . Mother Deceased     MI  . Father Deceased     Alzheimer's  . Brother Alive     One deceased from MI. One in good health    Social History:   reports that he has never smoked. He does not have any smokeless tobacco history on file. He reports that he drinks alcohol. He reports that he does not use illicit drugs.   History   Social History Narrative  . No narrative on file    Review of Systems: Unable to be obtained  Physical Exam: BP 176/103  Pulse 55  Temp(Src) 95 F (35 C) (Core (Comment))  Resp 28  Ht 6\' 1"  (1.854 m)  Wt 106.6 kg (235 lb 0.2 oz)  BMI 31.01 kg/m2  SpO2 100%  General appearance: Obese white male intubated and not responsive Head: Normocephalic, without obvious abnormality, atraumatic Neck: no adenopathy,  no carotid bruit, no JVD, supple, symmetrical, trachea midline and Central catheter present in left neck Lungs: Reduced breath sounds bilaterally, mild rhonchi Heart: regular rate and rhythm, S1, S2 normal, no murmur, click, rub or gallop Abdomen: soft, non-tender; bowel sounds normal; no masses,  no organomegaly Rectal: deferred Extremities: extremities normal, atraumatic, no cyanosis or edema Pulses: 2+ and symmetric Neurologic: Not responsive, not spontaneously moving extremities  Labs: CBC  Recent Labs  11/01/13 1737  WBC 16.1*  RBC 4.53  HGB 14.0  HCT 42.4  PLT 166  MCV 93.6  MCH 30.9  MCHC 33.0  RDW 13.8   CMP   Recent Labs  11/01/13 1737  NA 142  K 4.2  CL 102  CO2 13*  GLUCOSE 185*  BUN 20  CREATININE 1.46*  CALCIUM 8.4  GFRNONAA 48*  GFRAA 55*   BNP (last 3 results)  Recent Labs  01/31/13 1428 02/09/13 0336 02/16/13 0440  PROBNP 11616.0* 16248.0* 8307.0*     Radiology: Endotracheal tube in place, edema present of the left lung  EKG: Undetermined tachycardia. Has a new right bundle branch block and a previous inferior infarction with worsened ST elevation  in the inferior leads  Signed:  W. Ashley Royalty MD [facility]   Cardiology Consultant  11/01/2013, 9:01 PM

## 2013-11-01 NOTE — H&P (Signed)
Name: Lonnie BerkshireRobert L [name] MRN: [id number] DOB: [birth year]    ADMISSION DATE:  11/01/2013 CONSULTATION DATE:  11/01/13  REFERRING MD :  Dr Harriett RushP Jordon, EDP PRIMARY SERVICE: s/p arrest  CHIEF COMPLAINT:  Cardiac arrest  BRIEF PATIENT DESCRIPTION: 67 yr known cad, mitral valve prolapse from papillary muscle rupture from MI  with MV replacement /bypass, found VF by wife.  SIGNIFICANT EVENTS / STUDIES:  2/22- VF arrest, cath lab  LINES / TUBES: 2/22 ETT>>>  CULTURES: 2/22 sputum>>>  ANTIBIOTICS: UNasyn 2/22>>>  HISTORY OF PRESENT ILLNESS:  68 yr old WM with recent history mitral valve surgery and cabg. FOund by wife unknown down time in chair at home. EMS reported VF, treated acls. ROSC reported 7-8 min. [facility] changed by EDP. Secretions foul, thick. To ct head, no acute, no ich. INterventional Cards to cath lab. Severe vent dyschrony noted in ER.  PAST MEDICAL HISTORY :  Past Medical History  Diagnosis Date  . High cholesterol   . Hypertension   . Constipation   . Reflux   . Complication of anesthesia     "Hard time waking me up" after sedation after dental procedure  . CAD (coronary artery disease)     LAD 95% proximal stenosis, D1 50-60% stenosis, the circumflex 40% stenosis, RCA subtotal stenosis.  . Cardiomyopathy, ischemic     EF was 30-35% by echo but 45-50% by cath  . Mitral regurgitation     Secondary to papillary muscle rupture   Past Surgical History  Procedure Laterality Date  . Stomach ulcer repair    . Tee without cardioversion N/A 02/04/2013    Procedure: TRANSESOPHAGEAL ECHOCARDIOGRAM (TEE);  Surgeon: Vesta MixerPhilip J Nahser, MD;  [facility];  Service: Cardiovascular;  Laterality: N/A;  . Intraoperative transesophageal echocardiogram N/A 02/05/2013    Procedure: INTRAOPERATIVE TRANSESOPHAGEAL ECHOCARDIOGRAM;  Surgeon: Loreli SlotSteven C Hendrickson, MD;  [facility];  Service: Open Heart Surgery;  Laterality: N/A;  . Endovein harvest of greater  saphenous vein Right 02/05/2013    Procedure: ENDOVEIN HARVEST OF GREATER SAPHENOUS VEIN;  Surgeon: Loreli SlotSteven C Hendrickson, MD;  [facility];  Service: Open Heart Surgery;  Laterality: Right;  . Patent foramen ovale closure N/A 02/05/2013    Procedure: PATENT FORAMEN OVALE CLOSURE;  Surgeon: Loreli SlotSteven C Hendrickson, MD;  [facility];  Service: Open Heart Surgery;  Laterality: N/A;  . Mitral valve replacement (mvr)/coronary artery bypass grafting (cabg) N/A 02/05/2013    Procedure: MITRAL VALVE REPLACEMENT (MVR)/CORONARY ARTERY BYPASS GRAFTING (CABG);  Surgeon: Loreli SlotSteven C Hendrickson, MD;  [facility];  Service: Open Heart Surgery;  Laterality: N/A;  x3 using right greater saphenous vein and left internal mammary.    Prior to Admission medications   Medication Sig Start Date End Date Taking? Authorizing Provider  aspirin EC 81 MG EC tablet Take 1 tablet (81 mg total) by mouth daily. 02/17/13   Wayne E Gold, PA-C  Aspirin-Acetaminophen-Caffeine (EXCEDRIN MIGRAINE PO) Take by mouth as needed.    Historical Provider, MD  bisoprolol (ZEBETA) 5 MG tablet Take 1 tablet (5 mg total) by mouth daily. 07/22/13   Rollene RotundaJames Hochrein, MD  lisinopril (PRINIVIL,ZESTRIL) 2.5 MG tablet Take 1 tablet (2.5 mg total) by mouth daily. 10/23/13   Rollene RotundaJames Hochrein, MD  ranitidine (ZANTAC) 150 MG tablet Take 150 mg by mouth 2 (two) times daily.    Historical Provider, MD  simvastatin (ZOCOR) 10 MG tablet Take 1 tablet by mouth daily. 09/23/13   Historical Provider,  MD   No Known Allergies  FAMILY HISTORY:  Family History  Problem Relation Age of Onset  . Heart attack Mother 46   SOCIAL HISTORY:  reports that he has never smoked. He does not have any smokeless tobacco history on file. He reports that he drinks alcohol. He reports that he does not use illicit drugs.  REVIEW OF SYSTEMS:  Unable to obtain, anoxic  SUBJECTIVE: in CT scanner  VITAL SIGNS: Pulse Rate:  [49-109] 49 (02/22 1955) Resp:  [18-24] 24 (02/22  1955) BP: (112-119)/(74-84) 112/74 mmHg (02/22 1745) SpO2:  [100 %] 100 % (02/22 1955) FiO2 (%):  [100 %] 100 % (02/22 1955) Weight:  [105.688 kg (233 lb)] 105.688 kg (233 lb) (02/22 1727) HEMODYNAMICS:   VENTILATOR SETTINGS: Vent Mode:  [-] PRVC FiO2 (%):  [100 %] 100 % Set Rate:  [16 bmp-20 bmp] 20 bmp Vt Set:  [560 mL-630 mL] 560 mL PEEP:  [5 cmH20] 5 cmH20 Plateau Pressure:  [26 cmH20] 26 cmH20 INTAKE / OUTPUT: Intake/Output   None     PHYSICAL EXAMINATION: General:  dyschrony in CT scanner Neuro:  Per 1mm, limited reaction, coughing, no other movement HEENT:  obese Cardiovascular:  s1 s2 RRT distant Lungs:  ronchi left greater rt Abdomen:  Obese, nt, nd, no r Musculoskeletal:  Edema plus 1 Skin:  No rash  LABS:  CBC  Recent Labs Lab 11/01/13 1737  WBC 16.1*  HGB 14.0  HCT 42.4  PLT 166   Coag's  Recent Labs Lab 11/01/13 1737  APTT 25  INR 1.14   BMET  Recent Labs Lab 11/01/13 1737  NA 142  K 4.2  CL 102  CO2 13*  BUN 20  CREATININE 1.46*  GLUCOSE 185*   Electrolytes  Recent Labs Lab 11/01/13 1737  CALCIUM 8.4   Sepsis Markers No results found for this basename: LATICACIDVEN, PROCALCITON, O2SATVEN,  in the last 168 hours ABG No results found for this basename: PHART, PCO2ART, PO2ART,  in the last 168 hours Liver Enzymes No results found for this basename: AST, ALT, ALKPHOS, BILITOT, ALBUMIN,  in the last 168 hours Cardiac Enzymes No results found for this basename: TROPONINI, PROBNP,  in the last 168 hours Glucose  Recent Labs Lab 11/01/13 1730  GLUCAP 162*    Imaging Ct Head Wo Contrast  11/01/2013   CLINICAL DATA:  68 year old male with altered mental status.  EXAM: CT HEAD WITHOUT CONTRAST  TECHNIQUE: Contiguous axial images were obtained from the base of the skull through the vertex without intravenous contrast.  COMPARISON:  03/14/2011  FINDINGS: A left occipital infarct appears subacute to remote.  No acute intracranial  abnormalities are identified, including mass lesion or mass effect, hydrocephalus, extra-axial fluid collection, midline shift, hemorrhage, or acute infarction. The visualized bony calvarium is unremarkable.  IMPRESSION: No evidence of acute intracranial abnormality.  Left occipital infarct which appears subacute-remote.   Electronically Signed   By: Laveda Abbe M.D.   On: 11/01/2013 18:26   Dg Chest Portable 1 View  11/01/2013   CLINICAL DATA:  ET tube placement  EXAM: PORTABLE CHEST - 1 VIEW  COMPARISON:  03/17/2013  FINDINGS: The endotracheal tube tip is in satisfactory position above the carina. The patient is status post median sternotomy and CABG procedure. Heart size appears enlarged. There may be mild edema within the left lung.  IMPRESSION: Endotracheal tube tip is above the carina.  Suspect a left lung edema.   Electronically Signed   By: Ladona Ridgel  Bradly Chris M.D.   On: 11/01/2013 17:56     CXR: ett wnl, diffus eleft infiltrates interstial, poor technique  ASSESSMENT / PLAN:  PULMONARY A:Acute respiratory failure, concern aspiration P:   For paralysis, rate to 20 STAT abg on 100% peep 5, TV 8 cc/kg With cooling, will produce 30% less CO2, too reduce MV after abg assessment pcxr in am  Even to pos balance goals  CARDIOVASCULAR A: VF arrest, cad, s/p MV replacement P:  Per cards To cath lab Consider asa May need primary arrythmia control agent if cath no culrpit = per cards Place line, assess cvp  RENAL A:  High risk ATN, for cold diuresis with hypothermia P:   bmet q4h No free water , at risk brain edema Mag, phos assessment  GASTROINTESTINAL A:  Abdominal distention, secondary to bagging, king airway P:   lft ppi Npo kub  HEMATOLOGIC A:  Leukocytosis, DVT prevention P:  Sub q heparin if not for systemic anticoagulation Cbc in am  scd  INFECTIOUS A:  Aspiration likely, number needed to harm hypothermia 14 PNA P:   unasyn Sputum BC  ENDOCRINE A:  At risk  hyperglycemia P:   ssi  NEUROLOGIC A:  S/p Arrest, high risk Anoxia P:   Down time is not clear, however, his rhythm is good prognostically and his neuro exam is encouraging Would perform hypothermia, cold saline, pads, to goal 32-34 Add versed, fent, nimbex Assess lactic acid level Goal MAP 85  TODAY'S SUMMARY: s/p VF arrest, to cath lab, hypothermia  I have personally obtained a history, examined the patient, evaluated laboratory and imaging results, formulated the assessment and plan and placed orders. CRITICAL CARE: The patient is critically ill with multiple organ systems failure and requires high complexity decision making for assessment and support, frequent evaluation and titration of therapies, application of advanced monitoring technologies and extensive interpretation of multiple databases. Critical Care Time devoted to patient care services described in this note is 35 minutes.   Mcarthur Rossetti. Tyson Alias, MD, FACP Pgr: [phone] [facility]  Pulmonary and Critical Care Medicine [facility] Pager: [phone]  11/01/2013, 8:26 PM

## 2013-11-01 NOTE — ED Provider Notes (Signed)
CSN: [id number]     Arrival date & time 11/01/13  1719 History   First MD Initiated Contact with Patient 11/01/13 1735     Chief Complaint  Patient presents with  . Post CPR      HPI  68 yr old WM with recent history mitral valve surgery and cabg. who was found unresponsive by his family. Seen well ~ 10 minutes earlier. Family started chest compressions. When EMS arrived they noted Vtach and subsequently defibrillated patient. He was shocked twice. EMS placed a king airway on a scnene. EMS had ROSC after 7 minutes.  On arrival to ED patient has palpable pulses he is not responding to pain. Taking spontaneous breaths over the king airway.   Past Medical History  Diagnosis Date  . High cholesterol   . Hypertension   . Constipation   . Reflux   . Complication of anesthesia     "Hard time waking me up" after sedation after dental procedure  . CAD (coronary artery disease)     LAD 95% proximal stenosis, D1 50-60% stenosis, the circumflex 40% stenosis, RCA subtotal stenosis.  . Cardiomyopathy, ischemic     EF was 30-35% by echo but 45-50% by cath  . Mitral regurgitation     Secondary to papillary muscle rupture   Past Surgical History  Procedure Laterality Date  . Stomach ulcer repair    . Tee without cardioversion N/A 02/04/2013    Procedure: TRANSESOPHAGEAL ECHOCARDIOGRAM (TEE);  Surgeon: Vesta Mixer, MD;  Location: [facility] ENDOSCOPY;  Service: Cardiovascular;  Laterality: N/A;  . Intraoperative transesophageal echocardiogram N/A 02/05/2013    Procedure: INTRAOPERATIVE TRANSESOPHAGEAL ECHOCARDIOGRAM;  Surgeon: [name], MD;  Location: [facility] OR;  Service: Open Heart Surgery;  Laterality: N/A;  . Endovein harvest of greater saphenous vein Right 02/05/2013    Procedure: ENDOVEIN HARVEST OF GREATER SAPHENOUS VEIN;  Surgeon: [name], MD;  Location: [facility] OR;  Service: Open Heart Surgery;  Laterality: Right;  . Patent foramen ovale closure N/A 02/05/2013    Procedure: PATENT  FORAMEN OVALE CLOSURE;  Surgeon: [name], MD;  Location: [facility] OR;  Service: Open Heart Surgery;  Laterality: N/A;  . Mitral valve replacement (mvr)/coronary artery bypass grafting (cabg) N/A 02/05/2013    Procedure: MITRAL VALVE REPLACEMENT (MVR)/CORONARY ARTERY BYPASS GRAFTING (CABG);  Surgeon: [name], MD;  Location: [facility] OR;  Service: Open Heart Surgery;  Laterality: N/A;  x3 using right greater saphenous vein and left internal mammary.   . Coronary artery bypass graft     Family History  Problem Relation Age of Onset  . Heart attack Mother 75   History  Substance Use Topics  . Smoking status: Never Smoker   . Smokeless tobacco: Not on file     Comment: 40 yrs ago  . Alcohol Use: Yes     Comment: week end - 1/5 of crown    Review of Systems  Unable to perform ROS: Patient nonverbal      Allergies  Review of patient's allergies indicates no known allergies.  Home Medications   No current outpatient prescriptions on file. BP 113/71  Pulse 42  Temp(Src) 90.3 F (32.4 C) (Core (Comment))  Resp 13  Ht 6\' 1"  (1.854 m)  Wt 235 lb 0.2 oz (106.6 kg)  BMI 31.01 kg/m2  SpO2 100% Physical Exam  Constitutional: No distress.  Obesity   HENT:  Head: Normocephalic.  Nose: Nose normal.  Swollen and bloody tongue   Eyes: Conjunctivae are  normal. Pupils are equal, round, and reactive to light.  Neck: Neck supple. No tracheal deviation present.  Cardiovascular: Intact distal pulses.   Murmur heard. Tachycardic   Pulmonary/Chest: Breath sounds normal. He is in respiratory distress.  Abdominal: Soft. He exhibits distension.  Genitourinary: Penis normal.  Musculoskeletal: Normal range of motion. He exhibits no edema.  Neurological:  Non responsive to painful stimuli.  Non verbal.   Skin: Skin is warm and dry.    ED Course  INTUBATION Date/Time: 11/01/2013 5:51 PM Performed by: Nadara MustardARRIETA, Adeliz Tonkinson Authorized by: Nadara MustardARRIETA, Sayana Salley Consent: The procedure was  performed in an emergent situation. Patient identity confirmed: arm band Indications: respiratory distress and  airway protection Intubation method: video-assisted Patient status: paralyzed (RSI) Sedatives: etomidate Paralytic: succinylcholine Tube size: 7.5 mm Tube type: cuffed Number of attempts: 1 Cricoid pressure: no Cords visualized: yes Breath sounds: equal Cuff inflated: yes ETT to lip: 25 cm Tube secured with: ETT holder Chest x-ray interpreted by me. Chest x-ray findings: endotracheal tube in appropriate position Patient tolerance: Patient tolerated the procedure well with no immediate complications.   (including critical care time) Labs Review Labs Reviewed  CBC - Abnormal; Notable for the following:    WBC 16.1 (*)    All other components within normal limits  BASIC METABOLIC PANEL - Abnormal; Notable for the following:    CO2 13 (*)    Glucose, Bld 185 (*)    Creatinine, Ser 1.46 (*)    GFR calc non Af Amer 48 (*)    GFR calc Af Amer 55 (*)    All other components within normal limits  LACTIC ACID, PLASMA - Abnormal; Notable for the following:    Lactic Acid, Venous 3.4 (*)    All other components within normal limits  BASIC METABOLIC PANEL - Abnormal; Notable for the following:    Potassium 3.4 (*)    CO2 16 (*)    Glucose, Bld 216 (*)    BUN 24 (*)    Creatinine, Ser 1.38 (*)    Calcium 7.4 (*)    GFR calc non Af Amer 51 (*)    GFR calc Af Amer 59 (*)    All other components within normal limits  BLOOD GAS, ARTERIAL - Abnormal; Notable for the following:    pH, Arterial 7.284 (*)    pCO2 arterial 31.0 (*)    pO2, Arterial 261.0 (*)    Bicarbonate 15.2 (*)    Acid-base deficit 10.9 (*)    All other components within normal limits  CBG MONITORING, ED - Abnormal; Notable for the following:    Glucose-Capillary 162 (*)    All other components within normal limits  POCT I-STAT, CHEM 8 - Abnormal; Notable for the following:    BUN 26 (*)     Creatinine, Ser 1.40 (*)    Glucose, Bld 217 (*)    Calcium, Ion 1.11 (*)    All other components within normal limits  MRSA PCR SCREENING  CULTURE, BLOOD (ROUTINE X 2)  CULTURE, BLOOD (ROUTINE X 2)  CULTURE, RESPIRATORY (NON-EXPECTORATED)  APTT  PROTIME-INR  TRIGLYCERIDES  TSH  BASIC METABOLIC PANEL  BASIC METABOLIC PANEL  BASIC METABOLIC PANEL  BASIC METABOLIC PANEL  BASIC METABOLIC PANEL  BASIC METABOLIC PANEL  APTT  PROTIME-INR  CBG MONITORING, ED   Imaging Review Ct Head Wo Contrast  11/01/2013   CLINICAL DATA:  68 year old male with altered mental status.  EXAM: CT HEAD WITHOUT CONTRAST  TECHNIQUE: Contiguous axial images were obtained  from the base of the skull through the vertex without intravenous contrast.  COMPARISON:  03/14/2011  FINDINGS: A left occipital infarct appears subacute to remote.  No acute intracranial abnormalities are identified, including mass lesion or mass effect, hydrocephalus, extra-axial fluid collection, midline shift, hemorrhage, or acute infarction. The visualized bony calvarium is unremarkable.  IMPRESSION: No evidence of acute intracranial abnormality.  Left occipital infarct which appears subacute-remote.   Electronically Signed   By: Laveda Abbe M.D.   On: 11/01/2013 18:26   Dg Chest Portable 1 View  11/01/2013   CLINICAL DATA:  ET tube placement  EXAM: PORTABLE CHEST - 1 VIEW  COMPARISON:  03/17/2013  FINDINGS: The endotracheal tube tip is in satisfactory position above the carina. The patient is status post median sternotomy and CABG procedure. Heart size appears enlarged. There may be mild edema within the left lung.  IMPRESSION: Endotracheal tube tip is above the carina.  Suspect a left lung edema.   Electronically Signed   By: Signa Kell M.D.   On: 11/01/2013 17:56      MDM   Final diagnoses:  Cardiac arrest  Acute CHF  Acute pulmonary edema  Acute respiratory failure    68 yo M cardiac arrest. King airway switched with ETT.   Palpable pulses. Cooling protocol initiated. Cardiology consulted on arrival , see their note for further details, in short patient taken emergently to cath lab. Amiodarone 450 mg given and gtt started in ED. Patient's family up dated multiple times in ED. Case discussed with my attending Dr. Manus Gunning. Patient transferred to cath lab emergently in critical but stable condition.     Nadara Mustard, MD 11/02/13 [phone]

## 2013-11-02 DIAGNOSIS — I369 Nonrheumatic tricuspid valve disorder, unspecified: Secondary | ICD-10-CM

## 2013-11-02 DIAGNOSIS — I2119 ST elevation (STEMI) myocardial infarction involving other coronary artery of inferior wall: Secondary | ICD-10-CM

## 2013-11-02 LAB — POCT I-STAT, CHEM 8
BUN: 24 mg/dL — ABNORMAL HIGH (ref 6–23)
CALCIUM ION: 1.12 mmol/L — AB (ref 1.13–1.30)
CHLORIDE: 109 meq/L (ref 96–112)
Creatinine, Ser: 1.4 mg/dL — ABNORMAL HIGH (ref 0.50–1.35)
GLUCOSE: 210 mg/dL — AB (ref 70–99)
HCT: 46 % (ref 39.0–52.0)
HEMOGLOBIN: 15.6 g/dL (ref 13.0–17.0)
Potassium: 3.9 mEq/L (ref 3.7–5.3)
Sodium: 141 mEq/L (ref 137–147)
TCO2: 17 mmol/L (ref 0–100)

## 2013-11-02 LAB — TSH: TSH: 4.022 u[IU]/mL (ref 0.350–4.500)

## 2013-11-02 LAB — GLUCOSE, CAPILLARY
GLUCOSE-CAPILLARY: 108 mg/dL — AB (ref 70–99)
GLUCOSE-CAPILLARY: 129 mg/dL — AB (ref 70–99)
GLUCOSE-CAPILLARY: 137 mg/dL — AB (ref 70–99)
GLUCOSE-CAPILLARY: 137 mg/dL — AB (ref 70–99)
GLUCOSE-CAPILLARY: 139 mg/dL — AB (ref 70–99)
GLUCOSE-CAPILLARY: 154 mg/dL — AB (ref 70–99)
GLUCOSE-CAPILLARY: 187 mg/dL — AB (ref 70–99)
Glucose-Capillary: 171 mg/dL — ABNORMAL HIGH (ref 70–99)
Glucose-Capillary: 205 mg/dL — ABNORMAL HIGH (ref 70–99)
Glucose-Capillary: 203 mg/dL — ABNORMAL HIGH (ref 70–99)
Glucose-Capillary: 186 mg/dL — ABNORMAL HIGH (ref 70–99)
Glucose-Capillary: 152 mg/dL — ABNORMAL HIGH (ref 70–99)
Glucose-Capillary: 148 mg/dL — ABNORMAL HIGH (ref 70–99)
Glucose-Capillary: 124 mg/dL — ABNORMAL HIGH (ref 70–99)
Glucose-Capillary: 126 mg/dL — ABNORMAL HIGH (ref 70–99)
Glucose-Capillary: 138 mg/dL — ABNORMAL HIGH (ref 70–99)

## 2013-11-02 LAB — BASIC METABOLIC PANEL
BUN: 26 mg/dL — ABNORMAL HIGH (ref 6–23)
BUN: 27 mg/dL — AB (ref 6–23)
BUN: 28 mg/dL — AB (ref 6–23)
BUN: 28 mg/dL — AB (ref 6–23)
BUN: 28 mg/dL — AB (ref 6–23)
BUN: 27 mg/dL — AB (ref 6–23)
CALCIUM: 7.8 mg/dL — AB (ref 8.4–10.5)
CALCIUM: 7.8 mg/dL — AB (ref 8.4–10.5)
CALCIUM: 8.1 mg/dL — AB (ref 8.4–10.5)
CHLORIDE: 107 meq/L (ref 96–112)
CO2: 14 meq/L — AB (ref 19–32)
CO2: 15 mEq/L — ABNORMAL LOW (ref 19–32)
CO2: 16 meq/L — AB (ref 19–32)
CO2: 15 mEq/L — ABNORMAL LOW (ref 19–32)
CO2: 15 meq/L — AB (ref 19–32)
CO2: 16 mEq/L — ABNORMAL LOW (ref 19–32)
CREATININE: 1.37 mg/dL — AB (ref 0.50–1.35)
CREATININE: 1.41 mg/dL — AB (ref 0.50–1.35)
CREATININE: 1.3 mg/dL (ref 0.50–1.35)
CREATININE: 1.31 mg/dL (ref 0.50–1.35)
Calcium: 7.4 mg/dL — ABNORMAL LOW (ref 8.4–10.5)
Calcium: 7.4 mg/dL — ABNORMAL LOW (ref 8.4–10.5)
Calcium: 7.7 mg/dL — ABNORMAL LOW (ref 8.4–10.5)
Chloride: 109 mEq/L (ref 96–112)
Chloride: 110 mEq/L (ref 96–112)
Chloride: 110 mEq/L (ref 96–112)
Chloride: 109 mEq/L (ref 96–112)
Chloride: 107 mEq/L (ref 96–112)
Creatinine, Ser: 1.38 mg/dL — ABNORMAL HIGH (ref 0.50–1.35)
Creatinine, Ser: 1.4 mg/dL — ABNORMAL HIGH (ref 0.50–1.35)
GFR calc Af Amer: 60 mL/min — ABNORMAL LOW (ref >90)
GFR calc Af Amer: 59 mL/min — ABNORMAL LOW (ref >90)
GFR calc Af Amer: 58 mL/min — ABNORMAL LOW (ref >90)
GFR calc Af Amer: 58 mL/min — ABNORMAL LOW (ref >90)
GFR calc Af Amer: 64 mL/min — ABNORMAL LOW (ref >90)
GFR calc Af Amer: 63 mL/min — ABNORMAL LOW (ref >90)
GFR calc non Af Amer: 51 mL/min — ABNORMAL LOW (ref >90)
GFR calc non Af Amer: 51 mL/min — ABNORMAL LOW (ref >90)
GFR calc non Af Amer: 50 mL/min — ABNORMAL LOW (ref >90)
GFR calc non Af Amer: 55 mL/min — ABNORMAL LOW (ref >90)
GFR calc non Af Amer: 54 mL/min — ABNORMAL LOW (ref >90)
GFR, EST NON AFRICAN AMERICAN: 50 mL/min — AB (ref >90)
GLUCOSE: 186 mg/dL — AB (ref 70–99)
GLUCOSE: 147 mg/dL — AB (ref 70–99)
GLUCOSE: 128 mg/dL — AB (ref 70–99)
Glucose, Bld: 138 mg/dL — ABNORMAL HIGH (ref 70–99)
Glucose, Bld: 143 mg/dL — ABNORMAL HIGH (ref 70–99)
Glucose, Bld: 141 mg/dL — ABNORMAL HIGH (ref 70–99)
POTASSIUM: 4.1 meq/L (ref 3.7–5.3)
Potassium: 4.1 mEq/L (ref 3.7–5.3)
Potassium: 4 mEq/L (ref 3.7–5.3)
Potassium: 3.9 mEq/L (ref 3.7–5.3)
Potassium: 3.6 mEq/L — ABNORMAL LOW (ref 3.7–5.3)
Potassium: 3.7 mEq/L (ref 3.7–5.3)
SODIUM: 138 meq/L (ref 137–147)
SODIUM: 142 meq/L (ref 137–147)
SODIUM: 140 meq/L (ref 137–147)
Sodium: 139 mEq/L (ref 137–147)
Sodium: 142 mEq/L (ref 137–147)
Sodium: 142 mEq/L (ref 137–147)

## 2013-11-02 LAB — POCT I-STAT 3, ART BLOOD GAS (G3+)
Acid-base deficit: 10 mmol/L — ABNORMAL HIGH (ref 0.0–2.0)
Bicarbonate: 15.1 mEq/L — ABNORMAL LOW (ref 20.0–24.0)
O2 Saturation: 95 %
PCO2 ART: 26.2 mmHg — AB (ref 35.0–45.0)
PH ART: 7.349 — AB (ref 7.350–7.450)
Patient temperature: 33
TCO2: 16 mmol/L (ref 0–100)
pO2, Arterial: 66 mmHg — ABNORMAL LOW (ref 80.0–100.0)

## 2013-11-02 LAB — APTT: aPTT: 25 seconds (ref 24–37)

## 2013-11-02 LAB — PROTIME-INR
INR: 1.1 (ref 0.00–1.49)
Prothrombin Time: 14 seconds (ref 11.6–15.2)

## 2013-11-02 LAB — CLOSTRIDIUM DIFFICILE BY PCR: Toxigenic C. Difficile by PCR: NEGATIVE

## 2013-11-02 LAB — MAGNESIUM: Magnesium: 1.7 mg/dL (ref 1.5–2.5)

## 2013-11-02 LAB — LACTATE DEHYDROGENASE: LDH: 514 U/L — ABNORMAL HIGH (ref 94–250)

## 2013-11-02 LAB — POCT ACTIVATED CLOTTING TIME: ACTIVATED CLOTTING TIME: 99 s

## 2013-11-02 MED ORDER — POTASSIUM CHLORIDE 10 MEQ/50ML IV SOLN
INTRAVENOUS | Status: AC
  Administered 2013-11-02: 10 meq via INTRAVENOUS
  Filled 2013-11-02: qty 50

## 2013-11-02 MED ORDER — SODIUM CHLORIDE 0.9 % IV SOLN
2.0000 mg/h | INTRAVENOUS | Status: DC
  Administered 2013-11-02 – 2013-11-03 (×2): 2 mg/h via INTRAVENOUS
  Filled 2013-11-02 (×2): qty 10

## 2013-11-02 MED ORDER — NOREPINEPHRINE BITARTRATE 1 MG/ML IJ SOLN
2.0000 ug/min | INTRAVENOUS | Status: DC
  Administered 2013-11-02: 20 ug/min via INTRAVENOUS
  Administered 2013-11-02: 5 ug/min via INTRAVENOUS
  Filled 2013-11-02 (×2): qty 16

## 2013-11-02 MED ORDER — DOPAMINE-DEXTROSE 3.2-5 MG/ML-% IV SOLN
5.0000 ug/kg/min | INTRAVENOUS | Status: DC
  Administered 2013-11-02: 5 ug/kg/min via INTRAVENOUS
  Filled 2013-11-02: qty 250

## 2013-11-02 MED ORDER — SODIUM CHLORIDE 0.9 % IV SOLN
INTRAVENOUS | Status: DC | PRN
Start: 2013-11-02 — End: 2013-11-13
  Administered 2013-11-02 – 2013-11-07 (×4): via INTRAVENOUS

## 2013-11-02 MED ORDER — INFLUENZA VAC SPLIT QUAD 0.5 ML IM SUSP
0.5000 mL | INTRAMUSCULAR | Status: AC
  Administered 2013-11-03: 0.5 mL via INTRAMUSCULAR
  Filled 2013-11-02: qty 0.5

## 2013-11-02 MED ORDER — PERFLUTREN LIPID MICROSPHERE
INTRAVENOUS | Status: AC
  Administered 2013-11-02: 1.5 mL
  Filled 2013-11-02: qty 10

## 2013-11-02 MED ORDER — MAGNESIUM SULFATE 50 % IJ SOLN
4.0000 g | Freq: Once | INTRAVENOUS | Status: DC
  Filled 2013-11-02: qty 8

## 2013-11-02 MED ORDER — MAGNESIUM SULFATE 4000MG/100ML IJ SOLN
4.0000 g | Freq: Once | INTRAMUSCULAR | Status: AC
Start: 2013-11-02 — End: 2013-11-02
  Administered 2013-11-02: 4 g via INTRAVENOUS
  Filled 2013-11-02: qty 100

## 2013-11-02 NOTE — Progress Notes (Signed)
Subjective: [name] is a 68yo M pmh CABG (LIMA to LAD, SVG to diagnoal and PDA) with MV replacement 2/2 inferior MI complicated by papillary muscle rupture 01/2013 p/w out of hospital VF/VT arrest.   LHC 11/01/13: EF 15-20%, severe global hypokinesis, LV akinesis and aneurysm of inferior wall, patent grafts. Hypothermia protocol began.  Levo: 5320mcg>>25  Tele overnight: brady cardia, ?junctional  Pt sedated, cooled, and intubated this AM.   . ampicillin-sulbactam (UNASYN) IV  3 g Intravenous Q6H  . antiseptic oral rinse  15 mL Mouth Rinse QID  . artificial tears   Both Eyes 3 times per day  . chlorhexidine  15 mL Mouth Rinse BID  . cisatracurium  0.1 mg/kg Intravenous Once  . heparin  5,000 Units Subcutaneous 3 times per day  . [START ON 11/03/2013] influenza vac split quadrivalent PF  0.5 mL Intramuscular Tomorrow-1000  . insulin aspart  0-9 Units Subcutaneous 6 times per day  . pantoprazole (PROTONIX) IV  40 mg Intravenous Q24H   . sodium chloride 20 mL/hr at 11/02/13 0523  . amiodarone (NEXTERONE PREMIX) 360 mg/200 mL dextrose 30 mg/hr (11/02/13 0800)  . cisatracurium (NIMBEX) infusion 1 mcg/kg/min (11/02/13 0800)  . fentaNYL infusion INTRAVENOUS 150 mcg/hr (11/02/13 0800)  . norepinephrine (LEVOPHED) Adult infusion 25 mcg/min (11/02/13 0800)  . propofol 20 mcg/kg/min (11/02/13 0800)   Objective: Vital signs in last 24 hours: Filed Vitals:   11/02/13 0400 11/02/13 0500 11/02/13 0600 11/02/13 0700  BP:  103/68 103/72 110/77  Pulse: 41 40 81 42  Temp: 91 F (32.8 C) 90.7 F (32.6 C) 91.2 F (32.9 C) 91.9 F (33.3 C)  TempSrc: Core (Comment)     Resp: 21 20 21  0  Height:      Weight:      SpO2: 100% 100% 100% 100%   Weight change:   Intake/Output Summary (Last 24 hours) at 11/02/13 0717 Last data filed at 11/02/13 0600  Gross per 24 hour  Intake 1607.85 ml  Output    580 ml  Net 1027.85 ml   General: intubated, sedated HEENT:  no scleral icterus Cardiac:  brady, regular rhythm, no rubs, murmurs or gallops Pulm: vented, some rhochrus sounds Abd: soft, nontender, nondistended, BS present Ext: warm and well perfused, no pedal edema Neuro: alert and oriented X3, cranial nerves II-XII grossly intact  Lab Results: Basic Metabolic Panel:  Recent Labs Lab 11/02/13 0230 11/02/13 0605  NA 138 139  K 4.1 4.1  CL 107 109  CO2 14* 15*  GLUCOSE 186* 147*  BUN 26* 27*  CREATININE 1.37* 1.38*  CALCIUM 7.8* 7.4*   CBC:  Recent Labs Lab 11/01/13 1737 11/01/13 2145 11/02/13 0031  WBC 16.1*  --   --   HGB 14.0 16.0 15.6  HCT 42.4 47.0 46.0  MCV 93.6  --   --   PLT 166  --   --    CBG:  Recent Labs Lab 11/01/13 2207 11/01/13 2312 11/02/13 0027 11/02/13 0206 11/02/13 0302 11/02/13 0358  GLUCAP 203* 186* 187* 154* 152* 148*   Fasting Lipid Panel:  Recent Labs Lab 11/01/13 2226  TRIG 76   Coagulation:  Recent Labs Lab 11/01/13 1737 11/02/13 0135  LABPROT 14.4 14.0  INR 1.14 1.10   Cardiac Studies: LHC 11/01/13: Left mainstem: Normal.  Left anterior descending (LAD): The LAD has a high grade stenosis proximally and the vessel fills by the LIMA graft. The first diagonal is severely disease proximally and fills by the  SVG graft.  Left circumflex (LCx): The LCx gives rise to 2 large OM branches then terminates in a smaller OM 3. Prior to the second OM there is an eccentric 70-80% stenosis.  Right coronary artery (RCA): The RCA is a large ectatic vessel proximally. It is diffusely diseased at the crux and occluded distally.  SVG to the diagonal is patent. In the mid LAD after graft insertion there is a focal 70% stenosis.  SVG to the PDA is patent and fills a large PLOM branch. There is a focal 50% ostial stenosis of the SVG.  LIMA graft to the LAD is widely patent.  Left ventriculography: Left ventricular systolic function is markedly abnormal. There is severe enlargement of the LV with akinesis and aneurysm of the entire  inferior wall. There is severe global hypokinesis. LVEF is estimated at 15-20%. There is no significant mitral regurgitation, a bioprosthetic mitral valve is in place.  Echo 12/14: Ef 30-35%, global hypokinesis with basal inferior and inferoseptal akinesis, mitral bioprosthesis present and no regurge/leak  Studies/Results: Ct Head Wo Contrast  11/01/2013   CLINICAL DATA:  68 year old male with altered mental status.  EXAM: CT HEAD WITHOUT CONTRAST  TECHNIQUE: Contiguous axial images were obtained from the base of the skull through the vertex without intravenous contrast.  COMPARISON:  03/14/2011  FINDINGS: A left occipital infarct appears subacute to remote.  No acute intracranial abnormalities are identified, including mass lesion or mass effect, hydrocephalus, extra-axial fluid collection, midline shift, hemorrhage, or acute infarction. The visualized bony calvarium is unremarkable.  IMPRESSION: No evidence of acute intracranial abnormality.  Left occipital infarct which appears subacute-remote.   Electronically Signed   By: Laveda Abbe M.D.   On: 11/01/2013 18:26   Dg Chest Port 1 View  11/02/2013   CLINICAL DATA:  Central line placement.  EXAM: PORTABLE CHEST - 1 VIEW  COMPARISON:  11/01/2013  FINDINGS: A left IJ central venous catheter is noted with tip overlying the mid/azygos region.  Cardiomegaly pulmonary vascular congestion noted is low volume film.  Evidence of previous CABG and cardiac valve replacement noted.  An endotracheal tube is identified with tip 4.2 cm above the carina.  An NG tube is identified extending off the field of view.  There is no evidence of pneumothorax.  IMPRESSION: Left central venous catheter placement with tip overlying the mid-upper SVC/azygos region. No evidence of pneumothorax.  Other support apparatus as described.  No other significant change.   Electronically Signed   By: Laveda Abbe M.D.   On: 11/02/2013 00:59   Dg Chest Portable 1 View  11/01/2013   CLINICAL DATA:   ET tube placement  EXAM: PORTABLE CHEST - 1 VIEW  COMPARISON:  03/17/2013  FINDINGS: The endotracheal tube tip is in satisfactory position above the carina. The patient is status post median sternotomy and CABG procedure. Heart size appears enlarged. There may be mild edema within the left lung.  IMPRESSION: Endotracheal tube tip is above the carina.  Suspect a left lung edema.   Electronically Signed   By: Signa Kell M.D.   On: 11/01/2013 17:56   Medications: I have reviewed the patient's current medications. Scheduled Meds: . ampicillin-sulbactam (UNASYN) IV  3 g Intravenous Q6H  . antiseptic oral rinse  15 mL Mouth Rinse QID  . artificial tears   Both Eyes 3 times per day  . chlorhexidine  15 mL Mouth Rinse BID  . cisatracurium  0.1 mg/kg Intravenous Once  . heparin  5,000 Units Subcutaneous  3 times per day  . [START ON 11/03/2013] influenza vac split quadrivalent PF  0.5 mL Intramuscular Tomorrow-1000  . insulin aspart  0-9 Units Subcutaneous 6 times per day  . pantoprazole (PROTONIX) IV  40 mg Intravenous Q24H   Continuous Infusions: . sodium chloride 20 mL/hr at 11/02/13 0523  . amiodarone (NEXTERONE PREMIX) 360 mg/200 mL dextrose 30 mg/hr (11/02/13 0032)  . cisatracurium (NIMBEX) infusion 1 mcg/kg/min (11/01/13 1830)  . fentaNYL infusion INTRAVENOUS 150 mcg/hr (11/01/13 2030)  . norepinephrine (LEVOPHED) Adult infusion 25 mcg/min (11/02/13 0630)  . propofol 20 mcg/kg/min (11/02/13 0302)   PRN Meds:.sodium chloride, cisatracurium  Assessment/Plan:  #VF/VT arrest: pt was down estimated 7 min and home CPR performed with 3 shocks and 2 epi for ROSC. LHC 11/01/13 no clear lesion to explain arrhtmia.  -hypothemia protocol -cont amio gtt -add BB  #cardiogenic shock: cont levo #VDRF: per pccm  #?c.diff: management per PCCM   LOS: 1 day   Christen Bame, MD 11/02/2013, 7:17 AM   The patient was seen, examined and discussed with Christen Bame, IM resident and I agree with the above.    In summary, Mr Lonnie [name] is a 68 year old male with h/o CAD, S/P CABG (LIMA to LAD, SVG to diagnoal and PDA) + MVR (papilalry muscle rupture), LVEF 30-35% with global hypokinesis and basal inferior and inferoseptal akinesis in 08/2013, refused ICD, that was admitted with  out of hospital VF/VT arrest with prolonged CPR.  Cath showed no culprit lesion, severe LV dysfunction (LVEF 15-20%) . The patient is undergoing hypothermia protocol, on inotrope support with Levophed.  ECG shows new LBBB and alternating SR with 1.AVB and ventricular escape rhythm with HR 40 BPM. Agree with lowering the dose of amiodarone load to half. There is possible aspiration pneumonia, on iv antibiotics. Cooling protocol till 11 pm tonight, we will start weaning off sedatives/paralytics tomorrow and be able to start assessing the brain damage. The case was discussed with family, the neurological status will be the main factor determining future therapy. Neurology consulted.   Tobias Alexander, Rexene Edison 11/02/2013

## 2013-11-02 NOTE — Progress Notes (Signed)
Echocardiogram 2D Echocardiogram with Definity has been performed.  Rena Sweeden 11/02/2013, 2:44 PM

## 2013-11-02 NOTE — Progress Notes (Signed)
eLink Physician-Brief Progress Note Patient Name: Lonnie BerkshireRobert L [name] DOB: 03-18-1946 MRN: [id number]  Date of Service  11/02/2013   HPI/Events of Note  Multiple loose stools since admission.  WBC was elevated on admission.   eICU Interventions  Plan: Stool for C Diff Rectal tube   Intervention Category Minor Interventions: Clinical assessment - ordering diagnostic tests  DETERDING,ELIZABETH 11/02/2013, 3:10 AM

## 2013-11-02 NOTE — Progress Notes (Signed)
Patient has been in SB with 1st degree block since admitting, his Qtc interval noted to be .48s at 2000. This morning the Qtc interval is increased at .62s, patient is asymptomatic at this time.  Dr. Donnie Ahoilley notified,  no new orders received, day team to address.  Jorge NyHayes, Kendre Sires [place]

## 2013-11-02 NOTE — ED Provider Notes (Signed)
I saw and evaluated the patient, reviewed the resident's note and I agree with the findings and plan. If applicable, I agree with the resident's interpretation of the EKG.  If applicable, I was present for critical portions of any procedures performed.  Cardiac arrest, with short down time, VT in field x 2 shocks.  King airway placed by EMS. Palpable pulses in ED with sinus tachycardia, somewhat wide QRS EKG reviewed with Dr. [place] and shows worsening ST elevation inferiorly King airway changed to ETT in ED. Amiodarone gtt started. Hypothermia protocol initiated. CT head before cath lab with Dr. [place].  CRITICAL CARE Performed by: Glynn OctaveANCOUR, Nilaya Bouie Total critical care time: 30 Critical care time was exclusive of separately billable procedures and treating other patients. Critical care was necessary to treat or prevent imminent or life-threatening deterioration. Critical care was time spent personally by me on the following activities: development of treatment plan with patient and/or surrogate as well as nursing, discussions with consultants, evaluation of patient's response to treatment, examination of patient, obtaining history from patient or surrogate, ordering and performing treatments and interventions, ordering and review of laboratory studies, ordering and review of radiographic studies, pulse oximetry and re-evaluation of patient's condition.      Glynn OctaveStephen Chanae Gemma, MD 11/02/13 [phone]

## 2013-11-02 NOTE — H&P (Signed)
Name: Lonnie [name] MRN: [id number] DOB: 1946-06-09    ADMISSION DATE:  11/01/2013 CONSULTATION DATE:  11/01/13  REFERRING MD :  Dr Harriett Rush, EDP PRIMARY SERVICE: s/p arrest  CHIEF COMPLAINT:  Cardiac arrest  BRIEF PATIENT DESCRIPTION: 68 yr known cad, mitral valve prolapse from papillary muscle rupture from MI  with MV replacement /bypass, found VF by wife.  SIGNIFICANT EVENTS / STUDIES:  2/22- VF arrest, cath lab  LINES / TUBES: 2/22 ETT>>> 2/22 left IJ>>>  CULTURES: 2/22 sputum>>> 2/22 BC>>> 2/23 cdiff>>>  ANTIBIOTICS: UNasyn 2/22>>>  SUBJECTIVE: remains at goal tempt  VITAL SIGNS: Temp:  [90.3 F (32.4 C)-95 F (35 C)] 90.9 F (32.7 C) (02/23 0900) Pulse Rate:  [34-109] 40 (02/23 0900) Resp:  [13-28] 20 (02/23 0900) BP: (75-193)/(57-115) 139/84 mmHg (02/23 0900) SpO2:  [99 %-100 %] 100 % (02/23 0900) Arterial Line BP: (89-131)/(60-80) 131/74 mmHg (02/23 0900) FiO2 (%):  [40 %-100 %] 40 % (02/23 0900) Weight:  [105.688 kg (233 lb)-106.6 kg (235 lb 0.2 oz)] 106.6 kg (235 lb 0.2 oz) (02/22 2000) HEMODYNAMICS: CVP:  [16 mmHg] 16 mmHg VENTILATOR SETTINGS: Vent Mode:  [-] PRVC FiO2 (%):  [40 %-100 %] 40 % Set Rate:  [16 bmp-20 bmp] 20 bmp Vt Set:  [560 mL-630 mL] 560 mL PEEP:  [5 cmH20] 5 cmH20 Plateau Pressure:  [22 cmH20-26 cmH20] 22 cmH20 INTAKE / OUTPUT: Intake/Output     02/22 0701 - 02/23 0700 02/23 0701 - 02/24 0700   I.V. (mL/kg) 1299.9 (12.2) 178.4 (1.7)   IV Piggyback 400 100   Total Intake(mL/kg) 1699.9 (15.9) 278.4 (2.6)   Urine (mL/kg/hr) 580 125 (0.4)   Total Output 580 125   Net +1119.9 +153.4        Stool Occurrence 4 x      PHYSICAL EXAMINATION: General:  rass deep, paralyded Neuro:  Per 2 mm, paralyzed HEENT:  obese Cardiovascular:  s1 s2 RRT distant Lungs:  ronchi Abdomen:  Obese, nt, nd, no r Musculoskeletal:  Edema plus 1 Skin:  No rash  LABS:  CBC  Recent Labs Lab 11/01/13 1737 11/01/13 2145 11/02/13 0031   WBC 16.1*  --   --   HGB 14.0 16.0 15.6  HCT 42.4 47.0 46.0  PLT 166  --   --    Coag's  Recent Labs Lab 11/01/13 1737 11/02/13 0135  APTT 25 25  INR 1.14 1.10   BMET  Recent Labs Lab 11/01/13 2226 11/02/13 0031 11/02/13 0230 11/02/13 0605  NA 139 141 138 139  K 3.4* 3.9 4.1 4.1  CL 106 109 107 109  CO2 16*  --  14* 15*  BUN 24* 24* 26* 27*  CREATININE 1.38* 1.40* 1.37* 1.38*  GLUCOSE 216* 210* 186* 147*   Electrolytes  Recent Labs Lab 11/01/13 2226 11/02/13 0230 11/02/13 0605 11/02/13 0803  CALCIUM 7.4* 7.8* 7.4*  --   MG  --   --   --  1.7   Sepsis Markers  Recent Labs Lab 11/01/13 2226  LATICACIDVEN 3.4*   ABG  Recent Labs Lab 11/01/13 2243  PHART 7.284*  PCO2ART 31.0*  PO2ART 261.0*   Liver Enzymes No results found for this basename: AST, ALT, ALKPHOS, BILITOT, ALBUMIN,  in the last 168 hours Cardiac Enzymes No results found for this basename: TROPONINI, PROBNP,  in the last 168 hours Glucose  Recent Labs Lab 11/02/13 0027 11/02/13 0206 11/02/13 0302 11/02/13 0358 11/02/13 0610 11/02/13 0801  GLUCAP 187* 154* 152*  148* 139* 129*    Imaging Ct Head Wo Contrast  11/01/2013   CLINICAL DATA:  68 year old male with altered mental status.  EXAM: CT HEAD WITHOUT CONTRAST  TECHNIQUE: Contiguous axial images were obtained from the base of the skull through the vertex without intravenous contrast.  COMPARISON:  03/14/2011  FINDINGS: A left occipital infarct appears subacute to remote.  No acute intracranial abnormalities are identified, including mass lesion or mass effect, hydrocephalus, extra-axial fluid collection, midline shift, hemorrhage, or acute infarction. The visualized bony calvarium is unremarkable.  IMPRESSION: No evidence of acute intracranial abnormality.  Left occipital infarct which appears subacute-remote.   Electronically Signed   By: Laveda AbbeJeff  Hu M.D.   On: 11/01/2013 18:26   Dg Chest Port 1 View  11/02/2013   CLINICAL DATA:   Central line placement.  EXAM: PORTABLE CHEST - 1 VIEW  COMPARISON:  11/01/2013  FINDINGS: A left IJ central venous catheter is noted with tip overlying the mid/azygos region.  Cardiomegaly pulmonary vascular congestion noted is low volume film.  Evidence of previous CABG and cardiac valve replacement noted.  An endotracheal tube is identified with tip 4.2 cm above the carina.  An NG tube is identified extending off the field of view.  There is no evidence of pneumothorax.  IMPRESSION: Left central venous catheter placement with tip overlying the mid-upper SVC/azygos region. No evidence of pneumothorax.  Other support apparatus as described.  No other significant change.   Electronically Signed   By: Laveda AbbeJeff  Hu M.D.   On: 11/02/2013 00:59   Dg Chest Portable 1 View  11/01/2013   CLINICAL DATA:  ET tube placement  EXAM: PORTABLE CHEST - 1 VIEW  COMPARISON:  03/17/2013  FINDINGS: The endotracheal tube tip is in satisfactory position above the carina. The patient is status post median sternotomy and CABG procedure. Heart size appears enlarged. There may be mild edema within the left lung.  IMPRESSION: Endotracheal tube tip is above the carina.  Suspect a left lung edema.   Electronically Signed   By: Signa Kellaylor  Stroud M.D.   On: 11/01/2013 17:56     CXR: ett wn, line nwl, bilateral int infiltrates  ASSESSMENT / PLAN:  PULMONARY A:Acute respiratory failure, concern aspiration P:   abg reviewed, increase MV, repeat abg pcxr in am  pos balance goals  CARDIOVASCULAR A: VF arrest, cad, s/p MV replacement P:  Per cards amio replace mag cvp assessment 16, kvo Dc propofol, use versed as on levophed Levophed to MAP 85 while on hypothermia  RENAL A:  High risk ATN, for cold diuresis with hypothermia, hypomag P:   bmet q4h No free water , at risk brain edema Mag supp  GASTROINTESTINAL A:  Abdominal distention, secondary to bagging, king airway, at risk bowel ischemia P:   lft ppi Npo maintain  this kub ldh  HEMATOLOGIC A:  Leukocytosis, DVT prevention P:  Sub q heparin Cbc in am  scd  INFECTIOUS A:  Aspiration likely, lose stools r/o cdiff P:   unasyn cdiff sent, no known abx exposure  ENDOCRINE A:  At risk hyperglycemia P:   ssi  NEUROLOGIC A:  S/p Arrest, high risk Anoxia P:   Dc propofo use versed and continue  fent, nimbex Assess lactic acid level - re assuring as less 6 Goal MAP 85  TODAY'S SUMMARY: maintain goal 32-33, rewarm ay 11 pm, mag supp, cdiff sent, vent changes  I have personally obtained a history, examined the patient, evaluated laboratory and imaging results,  formulated the assessment and plan and placed orders. CRITICAL CARE: The patient is critically ill with multiple organ systems failure and requires high complexity decision making for assessment and support, frequent evaluation and titration of therapies, application of advanced monitoring technologies and extensive interpretation of multiple databases. Critical Care Time devoted to patient care services described in this note is 35 minutes.   Mcarthur Rossetti. Tyson Alias, MD, FACP Pgr: [phone] [facility]  Pulmonary and Critical Care Medicine [facility] Pager: [phone]  11/02/2013, 10:07 AM

## 2013-11-03 ENCOUNTER — Inpatient Hospital Stay (HOSPITAL_COMMUNITY): Payer: Managed Care, Other (non HMO)

## 2013-11-03 DIAGNOSIS — N179 Acute kidney failure, unspecified: Secondary | ICD-10-CM

## 2013-11-03 LAB — CBC WITH DIFFERENTIAL/PLATELET
BASOS ABS: 0 10*3/uL (ref 0.0–0.1)
Basophils Relative: 0 % (ref 0–1)
EOS ABS: 0.1 10*3/uL (ref 0.0–0.7)
Eosinophils Relative: 1 % (ref 0–5)
HCT: 42.2 % (ref 39.0–52.0)
Hemoglobin: 15.1 g/dL (ref 13.0–17.0)
Lymphocytes Relative: 6 % — ABNORMAL LOW (ref 12–46)
Lymphs Abs: 0.8 10*3/uL (ref 0.7–4.0)
MCH: 31.3 pg (ref 26.0–34.0)
MCHC: 35.8 g/dL (ref 30.0–36.0)
MCV: 87.6 fL (ref 78.0–100.0)
Monocytes Absolute: 1.1 10*3/uL — ABNORMAL HIGH (ref 0.1–1.0)
Monocytes Relative: 7 % (ref 3–12)
Neutro Abs: 12.4 10*3/uL — ABNORMAL HIGH (ref 1.7–7.7)
Neutrophils Relative %: 86 % — ABNORMAL HIGH (ref 43–77)
PLATELETS: 106 10*3/uL — AB (ref 150–400)
RBC: 4.82 MIL/uL (ref 4.22–5.81)
RDW: 13.6 % (ref 11.5–15.5)
WBC MORPHOLOGY: INCREASED
WBC: 14.3 10*3/uL — ABNORMAL HIGH (ref 4.0–10.5)

## 2013-11-03 LAB — BLOOD GAS, ARTERIAL
Acid-base deficit: 6.5 mmol/L — ABNORMAL HIGH (ref 0.0–2.0)
Bicarbonate: 17.8 mEq/L — ABNORMAL LOW (ref 20.0–24.0)
DRAWN BY: 331761
FIO2: 0.4 %
LHR: 26 {breaths}/min
O2 Saturation: 95.8 %
PEEP: 5 cmH2O
PH ART: 7.397 (ref 7.350–7.450)
PO2 ART: 73.3 mmHg — AB (ref 80.0–100.0)
Patient temperature: 95
TCO2: 18.8 mmol/L (ref 0–100)
VT: 560 mL
pCO2 arterial: 28.8 mmHg — ABNORMAL LOW (ref 35.0–45.0)

## 2013-11-03 LAB — GLUCOSE, CAPILLARY
GLUCOSE-CAPILLARY: 85 mg/dL (ref 70–99)
GLUCOSE-CAPILLARY: 137 mg/dL — AB (ref 70–99)
Glucose-Capillary: 105 mg/dL — ABNORMAL HIGH (ref 70–99)
Glucose-Capillary: 105 mg/dL — ABNORMAL HIGH (ref 70–99)
Glucose-Capillary: 146 mg/dL — ABNORMAL HIGH (ref 70–99)
Glucose-Capillary: 97 mg/dL (ref 70–99)

## 2013-11-03 LAB — BASIC METABOLIC PANEL
BUN: 28 mg/dL — ABNORMAL HIGH (ref 6–23)
BUN: 27 mg/dL — AB (ref 6–23)
CHLORIDE: 107 meq/L (ref 96–112)
CO2: 17 mEq/L — ABNORMAL LOW (ref 19–32)
CO2: 17 mEq/L — ABNORMAL LOW (ref 19–32)
CREATININE: 1.32 mg/dL (ref 0.50–1.35)
Calcium: 8 mg/dL — ABNORMAL LOW (ref 8.4–10.5)
Calcium: 8 mg/dL — ABNORMAL LOW (ref 8.4–10.5)
Chloride: 107 mEq/L (ref 96–112)
Creatinine, Ser: 1.41 mg/dL — ABNORMAL HIGH (ref 0.50–1.35)
GFR calc Af Amer: 58 mL/min — ABNORMAL LOW (ref >90)
GFR calc non Af Amer: 54 mL/min — ABNORMAL LOW (ref >90)
GFR calc non Af Amer: 50 mL/min — ABNORMAL LOW (ref >90)
GFR, EST AFRICAN AMERICAN: 62 mL/min — AB (ref >90)
GLUCOSE: 120 mg/dL — AB (ref 70–99)
Glucose, Bld: 129 mg/dL — ABNORMAL HIGH (ref 70–99)
Potassium: 3.9 mEq/L (ref 3.7–5.3)
Potassium: 4.6 mEq/L (ref 3.7–5.3)
Sodium: 141 mEq/L (ref 137–147)
Sodium: 142 mEq/L (ref 137–147)

## 2013-11-03 LAB — LACTIC ACID, PLASMA: Lactic Acid, Venous: 1.6 mmol/L (ref 0.5–2.2)

## 2013-11-03 LAB — COMPREHENSIVE METABOLIC PANEL
ALBUMIN: 2.8 g/dL — AB (ref 3.5–5.2)
ALK PHOS: 86 U/L (ref 39–117)
ALT: 64 U/L — AB (ref 0–53)
AST: 67 U/L — ABNORMAL HIGH (ref 0–37)
BILIRUBIN TOTAL: 1 mg/dL (ref 0.3–1.2)
BUN: 27 mg/dL — AB (ref 6–23)
CHLORIDE: 107 meq/L (ref 96–112)
CO2: 17 mEq/L — ABNORMAL LOW (ref 19–32)
Calcium: 8 mg/dL — ABNORMAL LOW (ref 8.4–10.5)
Creatinine, Ser: 1.34 mg/dL (ref 0.50–1.35)
GFR calc Af Amer: 61 mL/min — ABNORMAL LOW (ref >90)
GFR calc non Af Amer: 53 mL/min — ABNORMAL LOW (ref >90)
GLUCOSE: 121 mg/dL — AB (ref 70–99)
Potassium: 3.9 mEq/L (ref 3.7–5.3)
SODIUM: 141 meq/L (ref 137–147)
Total Protein: 6.1 g/dL (ref 6.0–8.3)

## 2013-11-03 LAB — LACTATE DEHYDROGENASE: LDH: 467 U/L — AB (ref 94–250)

## 2013-11-03 MED ORDER — FUROSEMIDE 10 MG/ML IJ SOLN
20.0000 mg | Freq: Once | INTRAMUSCULAR | Status: AC
  Administered 2013-11-03: 20 mg via INTRAVENOUS

## 2013-11-03 MED ORDER — FUROSEMIDE 10 MG/ML IJ SOLN
INTRAMUSCULAR | Status: AC
  Administered 2013-11-03: 20 mg via INTRAVENOUS
  Filled 2013-11-03: qty 4

## 2013-11-03 MED ORDER — VITAL HIGH PROTEIN PO LIQD
1000.0000 mL | ORAL | Status: DC
  Administered 2013-11-03 (×2): 1000 mL
  Filled 2013-11-03 (×4): qty 1000

## 2013-11-03 MED ORDER — VITAL HIGH PROTEIN PO LIQD
1000.0000 mL | ORAL | Status: DC
  Filled 2013-11-03 (×2): qty 1000

## 2013-11-03 MED ORDER — PRO-STAT SUGAR FREE PO LIQD
30.0000 mL | Freq: Four times a day (QID) | ORAL | Status: DC
  Administered 2013-11-03 (×3): 30 mL
  Filled 2013-11-03 (×11): qty 30

## 2013-11-03 MED ORDER — FENTANYL CITRATE 0.05 MG/ML IJ SOLN
50.0000 ug | INTRAMUSCULAR | Status: DC | PRN
  Administered 2013-11-04 (×4): 50 ug via INTRAVENOUS
  Administered 2013-11-05: 25 ug via INTRAVENOUS
  Administered 2013-11-06: 50 ug via INTRAVENOUS
  Filled 2013-11-03 (×6): qty 2

## 2013-11-03 NOTE — Progress Notes (Signed)
Fentanyl and Versed drips D/C'd post hypothermia protocol.  200cc Fentanyl and 40cc Versed wasted in sink with 2 RNs.

## 2013-11-03 NOTE — Progress Notes (Signed)
Agree with recommendations and documentation provided by dietetic intern.  Note pt may extubate today after rewarming. Will monitor for diet advancement once appropriate for POs. Orders for TF have been placed in the event pt remains intubated today.    Loyce DysKacie Maliha Outten, MS RD LDN Clinical Inpatient Dietitian Pager: [phone] Weekend/After hours pager: [phone]

## 2013-11-03 NOTE — Progress Notes (Signed)
eLink Physician-Brief Progress Note Patient Name: Lonnie BerkshireRobert L [name] DOB: 06-May-1946 MRN: [id number]  Date of Service  11/03/2013   HPI/Events of Note   Pt completed hypothermia protocol.  Has some improvement in mental status.  Appears uncomfortable on vent.   eICU Interventions   Will start PAD 1 protocol >> prn fentanyl.   Intervention Category Minor Interventions: Agitation / anxiety - evaluation and management  Fendi Meinhardt 11/03/2013, 9:19 PM

## 2013-11-03 NOTE — Progress Notes (Signed)
Name: Lonnie [name] MRN: [id number] DOB: Jan 13, 1946    ADMISSION DATE:  11/01/2013 CONSULTATION DATE:  11/01/13  REFERRING MD :  Dr Harriett RushP Jordon, EDP PRIMARY SERVICE: s/p arrest  CHIEF COMPLAINT:  Cardiac arrest  BRIEF PATIENT DESCRIPTION: 68 yr known cad, mitral valve prolapse from papillary muscle rupture from MI  with MV replacement /bypass, found VF by wife.  SIGNIFICANT EVENTS / STUDIES:  2/22- VF arrest, cath lab 2/24- rewarming  LINES / TUBES: 2/22 ETT>>> 2/22 left IJ>>>  CULTURES: 2/22 sputum>>> 2/22 BC>>> 2/23 cdiff>>>neg  ANTIBIOTICS: UNasyn 2/22>>>  SUBJECTIVE: rewarming slowly  VITAL SIGNS: Temp:  [90.7 F (32.6 C)-93.6 F (34.2 C)] 93.6 F (34.2 C) (02/24 0300) Pulse Rate:  [39-81] 65 (02/24 0300) Resp:  [20-26] 26 (02/24 0300) BP: (103-144)/(68-86) 124/79 mmHg (02/24 0200) SpO2:  [98 %-100 %] 100 % (02/24 0300) Arterial Line BP: (109-156)/(65-77) 135/72 mmHg (02/24 0300) FiO2 (%):  [40 %] 40 % (02/24 0000) HEMODYNAMICS: CVP:  [6 mmHg-16 mmHg] 10 mmHg VENTILATOR SETTINGS: Vent Mode:  [-] PRVC FiO2 (%):  [40 %] 40 % Set Rate:  [20 bmp-26 bmp] 26 bmp Vt Set:  [560 mL] 560 mL PEEP:  [5 cmH20] 5 cmH20 Plateau Pressure:  [20 cmH20-22 cmH20] 20 cmH20 INTAKE / OUTPUT: Intake/Output     02/23 0701 - 02/24 0700   I.V. (mL/kg) 1548.7 (14.5)   NG/GT 30   IV Piggyback 400   Total Intake(mL/kg) 1978.7 (18.6)   Urine (mL/kg/hr) 2300 (0.9)   Stool 250 (0.1)   Total Output 2550   Net -571.4         PHYSICAL EXAMINATION: General:  rass deep, paralyzed Neuro:  Per 2 mm, paralyzed HEENT:  obese Cardiovascular:  s1 s2 RRT distant Lungs:  ronchi diffuse unchanged Abdomen:  Obese, nt, nd, no r Musculoskeletal:  Edema plus 1 Skin:  No rash  LABS:  CBC  Recent Labs Lab 11/01/13 1737 11/01/13 2145 11/02/13 0031  WBC 16.1*  --   --   HGB 14.0 16.0 15.6  HCT 42.4 47.0 46.0  PLT 166  --   --    Coag's  Recent Labs Lab 11/01/13 1737  11/02/13 0135  APTT 25 25  INR 1.14 1.10   BMET  Recent Labs Lab 11/02/13 1755 11/02/13 2228 11/03/13 0241  NA 142 140 141  K 3.6* 3.7 3.9  CL 109 107 107  CO2 15* 16* 17*  BUN 28* 27* 28*  CREATININE 1.30 1.31 1.32  GLUCOSE 141* 128* 129*   Electrolytes  Recent Labs Lab 11/02/13 0803  11/02/13 1755 11/02/13 2228 11/03/13 0241  CALCIUM  --   < > 7.8* 8.1* 8.0*  MG 1.7  --   --   --   --   < > = values in this interval not displayed. Sepsis Markers  Recent Labs Lab 11/01/13 2226  LATICACIDVEN 3.4*   ABG  Recent Labs Lab 11/01/13 2243 11/02/13 1127  PHART 7.284* 7.349*  PCO2ART 31.0* 26.2*  PO2ART 261.0* 66.0*   Liver Enzymes No results found for this basename: AST, ALT, ALKPHOS, BILITOT, ALBUMIN,  in the last 168 hours Cardiac Enzymes No results found for this basename: TROPONINI, PROBNP,  in the last 168 hours Glucose  Recent Labs Lab 11/02/13 1121 11/02/13 1440 11/02/13 1609 11/02/13 1747 11/02/13 1927 11/03/13 0004  GLUCAP 108* 137* 137* 126* 138* 105*    Imaging Ct Head Wo Contrast  11/01/2013   CLINICAL DATA:  68 year old male with altered  mental status.  EXAM: CT HEAD WITHOUT CONTRAST  TECHNIQUE: Contiguous axial images were obtained from the base of the skull through the vertex without intravenous contrast.  COMPARISON:  03/14/2011  FINDINGS: A left occipital infarct appears subacute to remote.  No acute intracranial abnormalities are identified, including mass lesion or mass effect, hydrocephalus, extra-axial fluid collection, midline shift, hemorrhage, or acute infarction. The visualized bony calvarium is unremarkable.  IMPRESSION: No evidence of acute intracranial abnormality.  Left occipital infarct which appears subacute-remote.   Electronically Signed   By: Laveda Abbe M.D.   On: 11/01/2013 18:26   Dg Chest Port 1 View  11/02/2013   CLINICAL DATA:  Central line placement.  EXAM: PORTABLE CHEST - 1 VIEW  COMPARISON:  11/01/2013  FINDINGS:  A left IJ central venous catheter is noted with tip overlying the mid/azygos region.  Cardiomegaly pulmonary vascular congestion noted is low volume film.  Evidence of previous CABG and cardiac valve replacement noted.  An endotracheal tube is identified with tip 4.2 cm above the carina.  An NG tube is identified extending off the field of view.  There is no evidence of pneumothorax.  IMPRESSION: Left central venous catheter placement with tip overlying the mid-upper SVC/azygos region. No evidence of pneumothorax.  Other support apparatus as described.  No other significant change.   Electronically Signed   By: Laveda Abbe M.D.   On: 11/02/2013 00:59   Dg Chest Portable 1 View  11/01/2013   CLINICAL DATA:  ET tube placement  EXAM: PORTABLE CHEST - 1 VIEW  COMPARISON:  03/17/2013  FINDINGS: The endotracheal tube tip is in satisfactory position above the carina. The patient is status post median sternotomy and CABG procedure. Heart size appears enlarged. There may be mild edema within the left lung.  IMPRESSION: Endotracheal tube tip is above the carina.  Suspect a left lung edema.   Electronically Signed   By: Signa Kell M.D.   On: 11/01/2013 17:56     CXR: ett wn, line nwl, bilateral infiltrates mid lung fields  ASSESSMENT / PLAN:  PULMONARY A:Acute respiratory failure, concern aspiration P:   Full vent support, abg reviewed, keep same MV pcxr in am  pos balance goals, would like to even today, some fluid fissure rt In late afternoon may be able to wean sbt, pending status  CARDIOVASCULAR A: VF arrest, cad, s/p MV replacement. Cardiogenic shock  P:  Per cards amio continued Levophed to MAP 60 with rewarming, may need bolus with rewarm if BP drops  RENAL A:  High risk ATN      Metabolic acidosis +AG P:   bmet q4h to am No free water , at risk brain edema Recheck LA   GASTROINTESTINAL A:  Abdominal distention, secondary to bagging, king airway, at risk bowel ischemia - stools  resolved 2/24 P:   ppi Start TF, vital Follow bicarb further Repeat lft Repeat LDH   HEMATOLOGIC A:  Leukocytosis, DVT prevention P:  Sub q heparin Cbc in am  scd  INFECTIOUS A:  Aspiration likely, lose stools (Cdfiff neg) P:   Unasyn, f/u culture data Stool leuks  ENDOCRINE A:  At risk hyperglycemia P:   ssi  NEUROLOGIC A:  S/p Arrest, high risk Anoxia. Still on NMB P:   versed and continue  fent, nimbex, anticipate d/c NMB after re-warming complete  Avoid fevers Goal MAP 60  TODAY'S SUMMARY:  Rewarming, stools better, start TF , sbt in pm if awakens well, continued unsayn  I have personally obtained a history, examined the patient, evaluated laboratory and imaging results, formulated the assessment and plan and placed orders. CRITICAL CARE: The patient is critically ill with multiple organ systems failure and requires high complexity decision making for assessment and support, frequent evaluation and titration of therapies, application of advanced monitoring technologies and extensive interpretation of multiple databases. Critical Care Time devoted to patient care services described in this note is 30 minutes.   11/03/2013, 3:51 AM  Mcarthur Rossetti. Tyson Alias, MD, FACP Pgr: [phone] [facility]

## 2013-11-03 NOTE — Progress Notes (Addendum)
[facility]-Cardiology Note   Subjective: Intubated, opens eys to verbal stimuli  RN: dopa 5, Levo 2, Fentanyl 150, Versed 2 will try to turn both off at 11.  Nimbex off, Amiodaron 1/2 dose.  Severe 1st degree heart blood with bundle, CVP 13. HR 38 yesterday.    Objective: Vital signs in last 24 hours: Filed Vitals:   11/03/13 0700 11/03/13 0738 11/03/13 0800 11/03/13 0900  BP:  116/64    Pulse: 69 70 71 72  Temp: 95.7 F (35.4 C)  96.3 F (35.7 C) 96.8 F (36 C)  TempSrc: Core (Comment)  Core (Comment)   Resp: 26 26 26 26   Height:      Weight: 234 lb 9.1 oz (106.4 kg)     SpO2: 100% 99% 100% 97%   Weight change: 1 lb 9.1 oz (0.712 kg)  Intake/Output Summary (Last 24 hours) at 11/03/13 0929 Last data filed at 11/03/13 0906  Gross per 24 hour  Intake 2413.87 ml  Output   2820 ml  Net -406.13 ml   Vitals reviewed. General: resting in bed, NAD, intubated/sedated HEENT: [place]/at, no scleral icterus Cardiac: RRR, no rubs, murmurs or gallops Pulm: clear to auscultation bilaterally, no wheezes, rales, or rhonchi Abd: soft, nontender, nondistended, BS present Ext: warm and well perfused, 1+ pedal edema Neuro: intubated/sedated  Lab Results: Basic Metabolic Panel:  Recent Labs Lab 11/02/13 0803  11/03/13 0241 11/03/13 0615  NA  --   < > 141 141  K  --   < > 3.9 3.9  CL  --   < > 107 107  CO2  --   < > 17* 17*  GLUCOSE  --   < > 129* 121*  BUN  --   < > 28* 27*  CREATININE  --   < > 1.32 1.34  CALCIUM  --   < > 8.0* 8.0*  MG 1.7  --   --   --   < > = values in this interval not displayed. Liver Function Tests:  Recent Labs Lab 11/03/13 0615  AST 67*  ALT 64*  ALKPHOS 86  BILITOT 1.0  PROT 6.1  ALBUMIN 2.8*   CBC:  Recent Labs Lab 11/01/13 1737  11/02/13 0031 11/03/13 0615  WBC 16.1*  --   --  14.3*  NEUTROABS  --   --   --  12.4*  HGB 14.0  < > 15.6 15.1  HCT 42.4  < > 46.0 42.2  MCV 93.6  --   --  87.6  PLT 166  --   --  106*  < > = values in this  interval not displayed.  CBG:  Recent Labs Lab 11/02/13 1609 11/02/13 1747 11/02/13 1927 11/03/13 0004 11/03/13 0427 11/03/13 0758  GLUCAP 137* 126* 138* 105* 105* 85   Fasting Lipid Panel:  Recent Labs Lab 11/01/13 2226  TRIG 76   Thyroid Function Tests:  Recent Labs Lab 11/01/13 2226  TSH 4.022   Coagulation:  Recent Labs Lab 11/01/13 1737 11/02/13 0135  LABPROT 14.4 14.0  INR 1.14 1.10    Misc. Labs: Fecal lactoferrin  Micro Results: Recent Results (from the past 240 hour(s))  MRSA PCR SCREENING     Status: None   Collection Time    11/01/13  8:14 PM      Result Value Ref Range Status   MRSA by PCR NEGATIVE  NEGATIVE Final   Comment:            The  GeneXpert MRSA Assay (FDA     approved for NASAL specimens     only), is one component of a     comprehensive MRSA colonization     surveillance program. It is not     intended to diagnose MRSA     infection nor to guide or     monitor treatment for     MRSA infections.  CULTURE, BLOOD (ROUTINE X 2)     Status: None   Collection Time    11/01/13 10:15 PM      Result Value Ref Range Status   Specimen Description BLOOD LEFT HAND   Final   Special Requests BOTTLES DRAWN AEROBIC ONLY 5CC   Final   Culture  Setup Time     Final   Value: 11/02/2013 00:44     Performed at [facility]   Culture     Final   Value:        BLOOD CULTURE RECEIVED NO GROWTH TO DATE CULTURE WILL BE HELD FOR 5 DAYS BEFORE ISSUING A FINAL NEGATIVE REPORT     Performed at [facility]   Report Status PENDING   Incomplete  CULTURE, BLOOD (ROUTINE X 2)     Status: None   Collection Time    11/01/13 10:20 PM      Result Value Ref Range Status   Specimen Description BLOOD RIGHT HAND   Final   Special Requests BOTTLES DRAWN AEROBIC ONLY 5CC   Final   Culture  Setup Time     Final   Value: 11/02/2013 00:44     Performed at [facility]   Culture     Final   Value:        BLOOD CULTURE RECEIVED NO GROWTH  TO DATE CULTURE WILL BE HELD FOR 5 DAYS BEFORE ISSUING A FINAL NEGATIVE REPORT     Performed at [facility]   Report Status PENDING   Incomplete  CULTURE, RESPIRATORY (NON-EXPECTORATED)     Status: None   Collection Time    11/01/13 11:11 PM      Result Value Ref Range Status   Specimen Description TRACHEAL ASPIRATE   Final   Special Requests NONE   Final   Gram Stain     Final   Value: RARE WBC PRESENT,BOTH PMN AND MONONUCLEAR     NO SQUAMOUS EPITHELIAL CELLS SEEN     MODERATE GRAM POSITIVE COCCI     IN PAIRS IN CLUSTERS FEW GRAM NEGATIVE COCCI     RARE GRAM NEGATIVE RODS   Culture     Final   Value: Non-Pathogenic Oropharyngeal-type Flora Isolated.     Performed at [facility]   Report Status PENDING   Incomplete  CLOSTRIDIUM DIFFICILE BY PCR     Status: None   Collection Time    11/02/13  3:15 AM      Result Value Ref Range Status   C difficile by pcr NEGATIVE  NEGATIVE Final   Studies/Results: Ct Head Wo Contrast  11/01/2013   CLINICAL DATA:  69 year old male with altered mental status.  EXAM: CT HEAD WITHOUT CONTRAST  TECHNIQUE: Contiguous axial images were obtained from the base of the skull through the vertex without intravenous contrast.  COMPARISON:  03/14/2011  FINDINGS: A left occipital infarct appears subacute to remote.  No acute intracranial abnormalities are identified, including mass lesion or mass effect, hydrocephalus, extra-axial fluid collection, midline shift, hemorrhage, or acute infarction. The visualized bony calvarium is  unremarkable.  IMPRESSION: No evidence of acute intracranial abnormality.  Left occipital infarct which appears subacute-remote.   Electronically Signed   By: Laveda Abbe M.D.   On: 11/01/2013 18:26   Dg Chest Port 1 View  11/03/2013   CLINICAL DATA:  Follow-up pneumonia, shortness of breath.  EXAM: PORTABLE CHEST - 1 VIEW  COMPARISON:  Chest radiograph November 01, 1998 and the  FINDINGS: Endotracheal tube tip projects 3.1 cm  above the carina. Nasogastric tube at the gastroesophageal junction the distal tip is not imaged. Catheter projects over the lower chest. Left internal jugular central venous catheter with distal tip projecting in proximal superior vena cava. Pacer pad over the heart.  The cardiac silhouette appears at least mildly enlarged, status post sternotomy for cardiac valve replacement. Similar central pulmonary vasculature fullness and interstitial prominence, no definite pleural effusions the the left costophrenic angle is incompletely imaged. No pneumothorax.  Multiple EKG lines overlie the patient and may obscure subtle underlying pathology.  IMPRESSION: No apparent change in position of life-support lines.  Stable cardiomegaly and mild to moderate pulmonary edema.   Electronically Signed   By: Awilda Metro   On: 11/03/2013 05:29   Dg Chest Port 1 View  11/02/2013   CLINICAL DATA:  Central line placement.  EXAM: PORTABLE CHEST - 1 VIEW  COMPARISON:  11/01/2013  FINDINGS: A left IJ central venous catheter is noted with tip overlying the mid/azygos region.  Cardiomegaly pulmonary vascular congestion noted is low volume film.  Evidence of previous CABG and cardiac valve replacement noted.  An endotracheal tube is identified with tip 4.2 cm above the carina.  An NG tube is identified extending off the field of view.  There is no evidence of pneumothorax.  IMPRESSION: Left central venous catheter placement with tip overlying the mid-upper SVC/azygos region. No evidence of pneumothorax.  Other support apparatus as described.  No other significant change.   Electronically Signed   By: Laveda Abbe M.D.   On: 11/02/2013 00:59   Dg Chest Portable 1 View  11/01/2013   CLINICAL DATA:  ET tube placement  EXAM: PORTABLE CHEST - 1 VIEW  COMPARISON:  03/17/2013  FINDINGS: The endotracheal tube tip is in satisfactory position above the carina. The patient is status post median sternotomy and CABG procedure. Heart size appears  enlarged. There may be mild edema within the left lung.  IMPRESSION: Endotracheal tube tip is above the carina.  Suspect a left lung edema.   Electronically Signed   By: Signa Kell M.D.   On: 11/01/2013 17:56   Medications: Prescriptions prior to admission  Medication Sig Dispense Refill  . aspirin EC 81 MG EC tablet Take 1 tablet (81 mg total) by mouth daily.      . Aspirin-Acetaminophen-Caffeine (EXCEDRIN MIGRAINE PO) Take by mouth as needed.      . bisoprolol (ZEBETA) 5 MG tablet Take 1 tablet (5 mg total) by mouth daily.  30 tablet  6  . lisinopril (PRINIVIL,ZESTRIL) 2.5 MG tablet Take 1 tablet (2.5 mg total) by mouth daily.  90 tablet  3  . ranitidine (ZANTAC) 150 MG tablet Take 150 mg by mouth 2 (two) times daily.      . simvastatin (ZOCOR) 10 MG tablet Take 1 tablet by mouth daily.       Scheduled Meds: . ampicillin-sulbactam (UNASYN) IV  3 g Intravenous Q6H  . antiseptic oral rinse  15 mL Mouth Rinse QID  . artificial tears   Both Eyes 3  times per day  . chlorhexidine  15 mL Mouth Rinse BID  . feeding supplement (VITAL HIGH PROTEIN)  1,000 mL Per Tube Q24H  . heparin  5,000 Units Subcutaneous 3 times per day  . insulin aspart  0-9 Units Subcutaneous 6 times per day  . pantoprazole (PROTONIX) IV  40 mg Intravenous Q24H   Continuous Infusions: . sodium chloride 10 mL/hr at 11/02/13 1125  . amiodarone (NEXTERONE PREMIX) 360 mg/200 mL dextrose 30 mg/hr (11/03/13 0800)  . DOPamine 5 mcg/kg/min (11/03/13 0800)  . fentaNYL infusion INTRAVENOUS 150 mcg/hr (11/03/13 0800)  . midazolam (VERSED) infusion 2 mg/hr (11/03/13 0800)  . norepinephrine (LEVOPHED) Adult infusion 2 mcg/min (11/03/13 0800)   PRN Meds:.sodium chloride 2/23 echo  Study Conclusions  - Left ventricle: The cavity size was moderately dilated. Wall thickness was normal. The estimated ejection fraction was 15%. Diffuse hypokinesis with basal inferior aneurysm. No LV thrombus noted. Features are consistent with  a pseudonormal left ventricular filling pattern, with concomitant abnormal relaxation and increased filling pressure (grade 2 diastolic dysfunction). - Aortic valve: There was no stenosis. Trivial regurgitation. - Mitral valve: There was a bioprosthetic mitral valve. There was no evidence for stenosis. No significant regurgitation. Mean gradient: 3mm Hg (D). - Left atrium: The atrium was moderately dilated. - Right ventricle: The cavity size was mildly dilated. Systolic function was moderately reduced. - Right atrium: The atrium was mildly dilated. - Tricuspid valve: Peak RV-RA gradient: 26mm Hg (S). - Pulmonary arteries: PA peak pressure: 41mm Hg (S). - Systemic veins: IVC measured 2.6 cm with < 50% respirophasic variation, suggesting RA pressure 15 mmHg. Impressions:  - Moderately dilated LV with severe systolic dysfunction, EF 15%. Global hypokinesis with basal inferior aneurysm. Mildly dilated RV with moderately decreased systolic function. Bioprosthetic mitral valve appears to function normally. Mild pulmonary hypertension  11/01/13 cath  Procedural Findings:  Hemodynamics:  AO 112/68 mean of 86 mm Hg  LV 112/25 mm Hg  Coronary angiography:  Coronary dominance: right  Left mainstem: Normal.  Left anterior descending (LAD): The LAD has a high grade stenosis proximally and the vessel fills by the LIMA graft. The first diagonal is severely disease proximally and fills by the SVG graft.  Left circumflex (LCx): The LCx gives rise to 2 large OM branches then terminates in a smaller OM 3. Prior to the second OM there is an eccentric 70-80% stenosis.  Right coronary artery (RCA): The RCA is a large ectatic vessel proximally. It is diffusely diseased at the crux and occluded distally.  SVG to the diagonal is patent. In the mid LAD after graft insertion there is a focal 70% stenosis.  SVG to the PDA is patent and fills a large PLOM branch. There is a focal 50% ostial stenosis of the  SVG.  LIMA graft to the LAD is widely patent.  Left ventriculography: Left ventricular systolic function is markedly abnormal. There is severe enlargement of the LV with akinesis and aneurysm of the entire inferior wall. There is severe global hypokinesis. LVEF is estimated at 15-20%. There is no significant mitral regurgitation, a bioprosthetic mitral valve is in place.  Final Conclusions:  1. 3 vessel obstructive CAD.  2. Patent grafts including LIMA to the LAD, SVG to the diagonal, and SVG to the PDA.  3. Severe LV dysfunction.  Recommendations: There is no culprit lesion to explain his cardiac arrest. There is moderate obstructive disease in the diagonal and second OM but I think his arrest was predominantly related to  arrhythmia and pump failure. Will transport to ICU and continue supportive therapy with ventilator, pressors, and hypothermia protocol.    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  Associated with bradycardia, severe 1st degree HB with bundle on EKG. PVCs on tele -With h/o CAD, ischemic cardiomyopathy -EF 15% down from 30-35% 08/2013.   -s/p LHC 2/22 -Amio, Dopamine gtt -pending ekg   #Hypotension -levo, Dopamine. BP improved  -monitor  -try to wean pressors today (levo)  #VDRF -CCM following   #Diarrhea -c diff negative   #Concern for aspiration pna  -Unasyn. Noted leukocytosis 14.3  -pending cxs (resp, blood)  #thrombocytopenia -could be 2/2 crtical illness  -trend CBC  #acute encephalopathy -CCM following, intubated  -Versed, Fentanyl, off Nimbex   #elevated AG -17 -trend BMET. Lactic acid wnl this am   #elevated lfts  -AST 67, ALT 64  -trend CMET  #F/E/N -NSL -trend lytes  -TF with SSI-S  #GI/DVT px   -Protonix, scds, heparin     LOS: 2 days   Annett Gula, MD [phone] 11/03/2013, 9:29 AM  The patient was seen, examined and discussed with Annett Gula, MD, IM resident and I agree with the above.   In summary, Mr Cogswell is a 68 year old male with h/o CAD, S/P CABG (LIMA to LAD, SVG to diagnoal and PDA) + MVR (papilalry muscle rupture), LVEF 30-35% with global hypokinesis and basal inferior and inferoseptal akinesis in 08/2013, refused ICD, that was admitted with out of hospital VF/VT arrest with prolonged CPR. Cath showed no culprit lesion, severe LV dysfunction (LVEF 15-20%) . The patient underwent hypothermia protocol, on inotrope support with Levophed and with Dopamin.  ECG shows new LBBB and alternating SR with 1.AVB and ventricular escape rhythm with HR 40 BPM, that resolved and now HR 80-90.   The patient started to open eyes this morning, the goal would be to wean sedation and possibly pressors.  I personally reviewed his echocardiogram and his LVEF is severely globally decreased with inferior aneurysm, overall EL 15%. If the patient recovers mentally he will need to be evaluated by CHF services for advanced thearpies such as LVAD or heart transplant.   Tobias Alexander, H 11/03/2013  .

## 2013-11-03 NOTE — Progress Notes (Signed)
INITIAL NUTRITION ASSESSMENT  DOCUMENTATION CODES Per approved criteria  -Obesity Unspecified   INTERVENTION: Initiate Vital High Protein via OGT tube @ 20 ml/hr and increase by 10 ml every 4 hours to goal rate of 50 ml/hr with 30 ml Prostat 4 times a day to provide 1600 kcals, 165 grams of protein, and 1003 ml of free water.  NUTRITION DIAGNOSIS: Inadequate oral intake related to inability to eat as evidenced by NPO.   Goal: Enteral nutrition to provide 60-70% of estimated calorie needs (22-25 kcals/kg ideal body weight) and 100% of estimated protein needs, based on ASPEN guidelines for permissive underfeeding in critically ill obese individuals.  Monitor:  Vent status, weight trends, labs, TF rate and tolerance  Reason for Assessment: MD consult for TF adjustment and management.  68 y.o. male  Admitting Dx: Cardiac Arrest  ASSESSMENT: 68 yr old M with recent history mitral valve surgery and cabg. Pt with PMH of high cholesterol, HTN, CAD, cardiomyopathy, and mitral regurgitation. Pt with cardiac arrest at home and was found to be sitting in a chair unresponsive by his wife in ventricular fibrillation. Pt on hypothermia protocol.  Pt is currently intubated on ventilator support: MV: 14.9 L/Min Temp (24hrs), Avg:92.9 F (33.8 C), Min:91 F (32.8 C), Max:97.7 F (36.5 C)  Unable to obtain pt nutrition hx due to current state. Per RN, pt might be extubated today.   Nutrition Focused Physical Exam:  Subcutaneous Fat:  Orbital Region: N/A Upper Arm Region: WNL Thoracic and Lumbar Region: N/A  Muscle:  Temple Region: WNL Clavicle Bone Region: WNL Clavicle and Acromion Bone Region: WNL Scapular Bone Region: N/A Dorsal Hand: WNL Patellar Region: WNL Anterior Thigh Region: WNL Posterior Calf Region: WNL  Edema: +1 generalized edema   Height: Ht Readings from Last 1 Encounters:  11/01/13 6\' 1"  (1.854 m)    Weight: Wt Readings from Last 1 Encounters:  11/03/13 234  lb 9.1 oz (106.4 kg)    Ideal Body Weight: 184 lb  % Ideal Body Weight: 127%  Wt Readings from Last 10 Encounters:  11/03/13 234 lb 9.1 oz (106.4 kg)  11/03/13 234 lb 9.1 oz (106.4 kg)  10/23/13 233 lb (105.688 kg)  09/18/13 227 lb (102.967 kg)  07/22/13 221 lb (100.245 kg)  05/26/13 212 lb (96.163 kg)  04/23/13 212 lb (96.163 kg)  03/17/13 220 lb (99.791 kg)  03/09/13 215 lb (97.523 kg)  02/17/13 224 lb 13.9 oz (102 kg)    Usual Body Weight: 220 lb  % Usual Body Weight: 106%  BMI:  Body mass index is 30.95 kg/(m^2). Class I Obesity  Estimated Nutritional Needs: Kcal: 2312 (underfeeding goal: 1610-9604) Protein: 165-175 grams Fluid: > 1.6 L/day  Skin: +1 generalized edema  Diet Order:  NPO  EDUCATION NEEDS: -Education not appropriate at this time   Intake/Output Summary (Last 24 hours) at 11/03/13 1015 Last data filed at 11/03/13 1000  Gross per 24 hour  Intake 2283.37 ml  Output   2785 ml  Net -501.63 ml    Last BM: 2/24-loose and small-Rectal tube   Labs:   Recent Labs Lab 11/02/13 0803  11/02/13 2228 11/03/13 0241 11/03/13 0615  NA  --   < > 140 141 141  K  --   < > 3.7 3.9 3.9  CL  --   < > 107 107 107  CO2  --   < > 16* 17* 17*  BUN  --   < > 27* 28* 27*  CREATININE  --   < >  1.31 1.32 1.34  CALCIUM  --   < > 8.1* 8.0* 8.0*  MG 1.7  --   --   --   --   GLUCOSE  --   < > 128* 129* 121*  < > = values in this interval not displayed.  CBG (last 3)   Recent Labs  11/03/13 0004 11/03/13 0427 11/03/13 0758  GLUCAP 105* 105* 85    Scheduled Meds: . ampicillin-sulbactam (UNASYN) IV  3 g Intravenous Q6H  . antiseptic oral rinse  15 mL Mouth Rinse QID  . artificial tears   Both Eyes 3 times per day  . chlorhexidine  15 mL Mouth Rinse BID  . feeding supplement (VITAL HIGH PROTEIN)  1,000 mL Per Tube Q24H  . heparin  5,000 Units Subcutaneous 3 times per day  . insulin aspart  0-9 Units Subcutaneous 6 times per day  . pantoprazole  (PROTONIX) IV  40 mg Intravenous Q24H    Continuous Infusions: . sodium chloride 10 mL/hr at 11/02/13 1125  . amiodarone (NEXTERONE PREMIX) 360 mg/200 mL dextrose 30 mg/hr (11/03/13 0800)  . DOPamine 5 mcg/kg/min (11/03/13 0800)  . fentaNYL infusion INTRAVENOUS 150 mcg/hr (11/03/13 0800)  . midazolam (VERSED) infusion 2 mg/hr (11/03/13 0800)  . norepinephrine (LEVOPHED) Adult infusion Stopped (11/03/13 1000)    Past Medical History  Diagnosis Date  . High cholesterol   . Hypertension   . Constipation   . Reflux   . Complication of anesthesia     "Hard time waking me up" after sedation after dental procedure  . CAD (coronary artery disease)     LAD 95% proximal stenosis, D1 50-60% stenosis, the circumflex 40% stenosis, RCA subtotal stenosis.  . Cardiomyopathy, ischemic     EF was 30-35% by echo but 45-50% by cath  . Mitral regurgitation     Secondary to papillary muscle rupture    Past Surgical History  Procedure Laterality Date  . Stomach ulcer repair    . Tee without cardioversion N/A 02/04/2013    Procedure: TRANSESOPHAGEAL ECHOCARDIOGRAM (TEE);  Surgeon: Vesta MixerPhilip J Nahser, MD;  [facility];  Service: Cardiovascular;  Laterality: N/A;  . Intraoperative transesophageal echocardiogram N/A 02/05/2013    Procedure: INTRAOPERATIVE TRANSESOPHAGEAL ECHOCARDIOGRAM;  Surgeon: Loreli SlotSteven C Hendrickson, MD;  [facility];  Service: Open Heart Surgery;  Laterality: N/A;  . Endovein harvest of greater saphenous vein Right 02/05/2013    Procedure: ENDOVEIN HARVEST OF GREATER SAPHENOUS VEIN;  Surgeon: Loreli SlotSteven C Hendrickson, MD;  [facility];  Service: Open Heart Surgery;  Laterality: Right;  . Patent foramen ovale closure N/A 02/05/2013    Procedure: PATENT FORAMEN OVALE CLOSURE;  Surgeon: Loreli SlotSteven C Hendrickson, MD;  [facility];  Service: Open Heart Surgery;  Laterality: N/A;  . Mitral valve replacement (mvr)/coronary artery bypass grafting (cabg) N/A 02/05/2013    Procedure:  MITRAL VALVE REPLACEMENT (MVR)/CORONARY ARTERY BYPASS GRAFTING (CABG);  Surgeon: Loreli SlotSteven C Hendrickson, MD;  [facility];  Service: Open Heart Surgery;  Laterality: N/A;  x3 using right greater saphenous vein and left internal mammary.   . Coronary artery bypass graft      Marijean NiemannStephanie Styles Fambro Dietetic Intern Pager: [phone]

## 2013-11-04 ENCOUNTER — Inpatient Hospital Stay (HOSPITAL_COMMUNITY): Payer: Managed Care, Other (non HMO)

## 2013-11-04 DIAGNOSIS — I4729 Other ventricular tachycardia: Secondary | ICD-10-CM

## 2013-11-04 DIAGNOSIS — Z954 Presence of other heart-valve replacement: Secondary | ICD-10-CM

## 2013-11-04 DIAGNOSIS — I472 Ventricular tachycardia: Secondary | ICD-10-CM

## 2013-11-04 LAB — CARBOXYHEMOGLOBIN
Carboxyhemoglobin: 0.3 % — ABNORMAL LOW (ref 0.5–1.5)
Carboxyhemoglobin: 1.2 % (ref 0.5–1.5)
METHEMOGLOBIN: 1.1 % (ref 0.0–1.5)
Methemoglobin: 1.2 % (ref 0.0–1.5)
O2 Saturation: 82.9 %
O2 Saturation: 82.4 %
TOTAL HEMOGLOBIN: 11 g/dL — AB (ref 13.5–18.0)
Total hemoglobin: 13.3 g/dL — ABNORMAL LOW (ref 13.5–18.0)

## 2013-11-04 LAB — FECAL LACTOFERRIN, QUANT: Fecal Lactoferrin: POSITIVE

## 2013-11-04 LAB — HEPATIC FUNCTION PANEL
ALT: 53 U/L (ref 0–53)
AST: 39 U/L — ABNORMAL HIGH (ref 0–37)
Albumin: 2.6 g/dL — ABNORMAL LOW (ref 3.5–5.2)
Alkaline Phosphatase: 81 U/L (ref 39–117)
Bilirubin, Direct: <0.2 mg/dL (ref 0.0–0.3)
TOTAL PROTEIN: 6 g/dL (ref 6.0–8.3)
Total Bilirubin: 0.9 mg/dL (ref 0.3–1.2)

## 2013-11-04 LAB — CBC
HCT: 36.8 % — ABNORMAL LOW (ref 39.0–52.0)
Hemoglobin: 12.6 g/dL — ABNORMAL LOW (ref 13.0–17.0)
MCH: 30.7 pg (ref 26.0–34.0)
MCHC: 34.2 g/dL (ref 30.0–36.0)
MCV: 89.8 fL (ref 78.0–100.0)
Platelets: 106 10*3/uL — ABNORMAL LOW (ref 150–400)
RBC: 4.1 MIL/uL — ABNORMAL LOW (ref 4.22–5.81)
RDW: 14.6 % (ref 11.5–15.5)
WBC: 11.1 10*3/uL — ABNORMAL HIGH (ref 4.0–10.5)

## 2013-11-04 LAB — CULTURE, RESPIRATORY W GRAM STAIN

## 2013-11-04 LAB — GLUCOSE, CAPILLARY
GLUCOSE-CAPILLARY: 137 mg/dL — AB (ref 70–99)
GLUCOSE-CAPILLARY: 109 mg/dL — AB (ref 70–99)
GLUCOSE-CAPILLARY: 118 mg/dL — AB (ref 70–99)
GLUCOSE-CAPILLARY: 121 mg/dL — AB (ref 70–99)
Glucose-Capillary: 119 mg/dL — ABNORMAL HIGH (ref 70–99)
Glucose-Capillary: 138 mg/dL — ABNORMAL HIGH (ref 70–99)
Glucose-Capillary: 168 mg/dL — ABNORMAL HIGH (ref 70–99)

## 2013-11-04 LAB — BASIC METABOLIC PANEL
BUN: 38 mg/dL — ABNORMAL HIGH (ref 6–23)
CALCIUM: 8.1 mg/dL — AB (ref 8.4–10.5)
CHLORIDE: 106 meq/L (ref 96–112)
CO2: 18 meq/L — AB (ref 19–32)
Creatinine, Ser: 1.96 mg/dL — ABNORMAL HIGH (ref 0.50–1.35)
GFR calc Af Amer: 39 mL/min — ABNORMAL LOW (ref >90)
GFR calc non Af Amer: 33 mL/min — ABNORMAL LOW (ref >90)
Glucose, Bld: 158 mg/dL — ABNORMAL HIGH (ref 70–99)
Potassium: 4.2 mEq/L (ref 3.7–5.3)
SODIUM: 142 meq/L (ref 137–147)

## 2013-11-04 LAB — CULTURE, RESPIRATORY

## 2013-11-04 LAB — MAGNESIUM: MAGNESIUM: 2.2 mg/dL (ref 1.5–2.5)

## 2013-11-04 MED ORDER — BISOPROLOL FUMARATE 5 MG PO TABS
2.5000 mg | ORAL_TABLET | Freq: Every day | ORAL | Status: DC
  Administered 2013-11-04 – 2013-11-05 (×2): 2.5 mg via ORAL
  Filled 2013-11-04 (×3): qty 0.5

## 2013-11-04 NOTE — Progress Notes (Signed)
ANTIBIOTIC CONSULT NOTE - FOLLOW UP  Pharmacy Consult for Unasyn Indication: pneumonia  No Known Allergies  Patient Measurements: Height: 6\' 1"  (185.4 cm) Weight: 241 lb 13.5 oz (109.7 kg) IBW/kg (Calculated) : 79.9  Vital Signs: Temp: 99.3 F (37.4 C) (02/25 0751) Temp src: Core (Comment) (02/25 0751) BP: 149/73 mmHg (02/25 0903) Pulse Rate: 74 (02/25 0900) Intake/Output from previous day: 02/24 0701 - 02/25 0700 In: 2011.5 [I.V.:798.8; NG/GT:792.7; IV Piggyback:400] Out: 1835 [Urine:1635; Emesis/NG output:200] Intake/Output from this shift: Total I/O In: 26.7 [I.V.:26.7] Out: 175 [Urine:175]  Labs:  Recent Labs  11/01/13 1737  11/02/13 0031  11/03/13 0615 11/03/13 1013 11/04/13 0350  WBC 16.1*  --   --   --  14.3*  --  11.1*  HGB 14.0  < > 15.6  --  15.1  --  12.6*  PLT 166  --   --   --  106*  --  106*  CREATININE 1.46*  < > 1.40*  < > 1.34 1.41* 1.96*  < > = values in this interval not displayed. Estimated Creatinine Clearance: 46.8 ml/min (by C-G formula based on Cr of 1.96). No results found for this basename: VANCOTROUGH, Leodis Binet, VANCORANDOM, GENTTROUGH, GENTPEAK, GENTRANDOM, TOBRATROUGH, TOBRAPEAK, TOBRARND, AMIKACINPEAK, AMIKACINTROU, AMIKACIN,  in the last 72 hours   Microbiology: Recent Results (from the past 720 hour(s))  MRSA PCR SCREENING     Status: None   Collection Time    11/01/13  8:14 PM      Result Value Ref Range Status   MRSA by PCR NEGATIVE  NEGATIVE Final   Comment:            The GeneXpert MRSA Assay (FDA     approved for NASAL specimens     only), is one component of a     comprehensive MRSA colonization     surveillance program. It is not     intended to diagnose MRSA     infection nor to guide or     monitor treatment for     MRSA infections.  CULTURE, BLOOD (ROUTINE X 2)     Status: None   Collection Time    11/01/13 10:15 PM      Result Value Ref Range Status   Specimen Description BLOOD LEFT HAND   Final   Special  Requests BOTTLES DRAWN AEROBIC ONLY 5CC   Final   Culture  Setup Time     Final   Value: 11/02/2013 00:44     Performed at [facility]   Culture     Final   Value:        BLOOD CULTURE RECEIVED NO GROWTH TO DATE CULTURE WILL BE HELD FOR 5 DAYS BEFORE ISSUING A FINAL NEGATIVE REPORT     Performed at [facility]   Report Status PENDING   Incomplete  CULTURE, BLOOD (ROUTINE X 2)     Status: None   Collection Time    11/01/13 10:20 PM      Result Value Ref Range Status   Specimen Description BLOOD RIGHT HAND   Final   Special Requests BOTTLES DRAWN AEROBIC ONLY 5CC   Final   Culture  Setup Time     Final   Value: 11/02/2013 00:44     Performed at [facility]   Culture     Final   Value:        BLOOD CULTURE RECEIVED NO GROWTH TO DATE CULTURE WILL BE HELD FOR 5 DAYS  BEFORE ISSUING A FINAL NEGATIVE REPORT     [facility]   Report Status PENDING   Incomplete  CULTURE, RESPIRATORY (NON-EXPECTORATED)     Status: None   Collection Time    11/01/13 11:11 PM      Result Value Ref Range Status   Specimen Description TRACHEAL ASPIRATE   Final   Special Requests NONE   Final   Gram Stain     Final   Value: RARE WBC PRESENT,BOTH PMN AND MONONUCLEAR     NO SQUAMOUS EPITHELIAL CELLS SEEN     MODERATE GRAM POSITIVE COCCI     IN PAIRS IN CLUSTERS FEW GRAM NEGATIVE COCCI     RARE GRAM NEGATIVE RODS   Culture     Final   Value: Non-Pathogenic Oropharyngeal-type Flora Isolated.     [facility]   Report Status 11/04/2013 FINAL   Final  CLOSTRIDIUM DIFFICILE BY PCR     Status: None   Collection Time    11/02/13  3:15 AM      Result Value Ref Range Status   C difficile by pcr NEGATIVE  NEGATIVE Final    Anti-infectives   Start     Dose/Rate Route Frequency Ordered Stop   11/01/13 2200  Ampicillin-Sulbactam (UNASYN) 3 g in sodium chloride 0.9 % 100 mL IVPB     3 g 100 mL/hr over 60 Minutes Intravenous Every 6 hours 11/01/13  2048 11/05/13 2359      Assessment: 68 yo M s/p arrest now intubated. Unasyn was started 2/22 for concern of aspiration pneumonia. Patient is now afebrile and WBC has trended down 16.1>>14.3>>11.1. Scr has trended up 1.34>>1.96 (CrCl ~46 ml/min). Tomorrow is the last day of abx per CCM.   Plan:  - Continue Unasyn 3g IV q6h today   Rayetta Humphreyhristina K Adler, PharmD Candidate [facility] [employer]  11/04/2013,9:12 AM   Agree with plan above. Will continue to monitor renal function, temp, WBC, clinical improvement.  Margie BilletErika K. von Vajna, PharmD Clinical Pharmacist - Resident Pager: [phone] Pharmacy: [phone] 11/04/2013 10:10 AM

## 2013-11-04 NOTE — Progress Notes (Addendum)
Subjective: Pt with some vomiting of bile overnight. SBT this AM tolerating very well but gets agitated with HTN during wean. Pt off all pressors.   Tracks and opens eyes to command on vent.   Tele: NSR  Objective: Vital signs in last 24 hours: Filed Vitals:   11/04/13 0344 11/04/13 0400 11/04/13 0500 11/04/13 0600  BP:      Pulse:  75 72 72  Temp:   99.3 F (37.4 C) 99.5 F (37.5 C)  TempSrc:   Core (Comment) Core (Comment)  Resp:  26 26 26   Height:      Weight: 241 lb 13.5 oz (109.7 kg)     SpO2:  100% 100% 100%   Weight change: 7 lb 4.4 oz (3.3 kg)  Intake/Output Summary (Last 24 hours) at 11/04/13 0721 Last data filed at 11/04/13 0600  Gross per 24 hour  Intake 1974.81 ml  Output   1835 ml  Net 139.81 ml   Vitals reviewed. General: resting in bed, NAD, intubated/sedated HEENT: [place]/at, no scleral icterus Cardiac: RRR, no rubs, murmurs or gallops Pulm: clear to auscultation bilaterally, no wheezes, rales, or rhonchi Abd: soft, nontender, nondistended, BS present Ext: warm and well perfused, 1+ pedal edema Neuro: intubated/sedated  Lab Results: Basic Metabolic Panel:  Recent Labs Lab 11/02/13 0803  11/03/13 1013 11/04/13 0350  NA  --   < > 142 142  K  --   < > 4.6 4.2  CL  --   < > 107 106  CO2  --   < > 17* 18*  GLUCOSE  --   < > 120* 158*  BUN  --   < > 27* 38*  CREATININE  --   < > 1.41* 1.96*  CALCIUM  --   < > 8.0* 8.1*  MG 1.7  --   --  2.2  < > = values in this interval not displayed. Liver Function Tests:  Recent Labs Lab 11/03/13 0615 11/04/13 0350  AST 67* 39*  ALT 64* 53  ALKPHOS 86 81  BILITOT 1.0 0.9  PROT 6.1 6.0  ALBUMIN 2.8* 2.6*   CBC:  Recent Labs Lab 11/03/13 0615 11/04/13 0350  WBC 14.3* 11.1*  NEUTROABS 12.4*  --   HGB 15.1 12.6*  HCT 42.2 36.8*  MCV 87.6 89.8  PLT 106* 106*    CBG:  Recent Labs Lab 11/03/13 0758 11/03/13 1114 11/03/13 1533 11/03/13 2050 11/04/13 0004 11/04/13 0336  GLUCAP 85 97 146*  137* 168* 138*   Fasting Lipid Panel:  Recent Labs Lab 11/01/13 2226  TRIG 76   Thyroid Function Tests:  Recent Labs Lab 11/01/13 2226  TSH 4.022   Coagulation:  Recent Labs Lab 11/01/13 1737 11/02/13 0135  LABPROT 14.4 14.0  INR 1.14 1.10    Misc. Labs: Fecal lactoferrin  Micro Results: Recent Results (from the past 240 hour(s))  MRSA PCR SCREENING     Status: None   Collection Time    11/01/13  8:14 PM      Result Value Ref Range Status   MRSA by PCR NEGATIVE  NEGATIVE Final   Comment:            The GeneXpert MRSA Assay (FDA     approved for NASAL specimens     only), is one component of a     comprehensive MRSA colonization     surveillance program. It is not     intended to diagnose MRSA     infection  nor to guide or     monitor treatment for     MRSA infections.  CULTURE, BLOOD (ROUTINE X 2)     Status: None   Collection Time    11/01/13 10:15 PM      Result Value Ref Range Status   Specimen Description BLOOD LEFT HAND   Final   Special Requests BOTTLES DRAWN AEROBIC ONLY 5CC   Final   Culture  Setup Time     Final   Value: 11/02/2013 00:44     Performed at [facility]   Culture     Final   Value:        BLOOD CULTURE RECEIVED NO GROWTH TO DATE CULTURE WILL BE HELD FOR 5 DAYS BEFORE ISSUING A FINAL NEGATIVE REPORT     Performed at [facility]   Report Status PENDING   Incomplete  CULTURE, BLOOD (ROUTINE X 2)     Status: None   Collection Time    11/01/13 10:20 PM      Result Value Ref Range Status   Specimen Description BLOOD RIGHT HAND   Final   Special Requests BOTTLES DRAWN AEROBIC ONLY 5CC   Final   Culture  Setup Time     Final   Value: 11/02/2013 00:44     Performed at [facility]   Culture     Final   Value:        BLOOD CULTURE RECEIVED NO GROWTH TO DATE CULTURE WILL BE HELD FOR 5 DAYS BEFORE ISSUING A FINAL NEGATIVE REPORT     Performed at [facility]   Report Status PENDING   Incomplete   CULTURE, RESPIRATORY (NON-EXPECTORATED)     Status: None   Collection Time    11/01/13 11:11 PM      Result Value Ref Range Status   Specimen Description TRACHEAL ASPIRATE   Final   Special Requests NONE   Final   Gram Stain     Final   Value: RARE WBC PRESENT,BOTH PMN AND MONONUCLEAR     NO SQUAMOUS EPITHELIAL CELLS SEEN     MODERATE GRAM POSITIVE COCCI     IN PAIRS IN CLUSTERS FEW GRAM NEGATIVE COCCI     RARE GRAM NEGATIVE RODS   Culture     Final   Value: Non-Pathogenic Oropharyngeal-type Flora Isolated.     Performed at [facility]   Report Status PENDING   Incomplete  CLOSTRIDIUM DIFFICILE BY PCR     Status: None   Collection Time    11/02/13  3:15 AM      Result Value Ref Range Status   C difficile by pcr NEGATIVE  NEGATIVE Final   Studies/Results: Dg Chest Port 1 View  11/03/2013   CLINICAL DATA:  Follow-up pneumonia, shortness of breath.  EXAM: PORTABLE CHEST - 1 VIEW  COMPARISON:  Chest radiograph November 01, 1998 and the  FINDINGS: Endotracheal tube tip projects 3.1 cm above the carina. Nasogastric tube at the gastroesophageal junction the distal tip is not imaged. Catheter projects over the lower chest. Left internal jugular central venous catheter with distal tip projecting in proximal superior vena cava. Pacer pad over the heart.  The cardiac silhouette appears at least mildly enlarged, status post sternotomy for cardiac valve replacement. Similar central pulmonary vasculature fullness and interstitial prominence, no definite pleural effusions the the left costophrenic angle is incompletely imaged. No pneumothorax.  Multiple EKG lines overlie the patient and may obscure subtle underlying pathology.  IMPRESSION: No apparent change in position of life-support lines.  Stable cardiomegaly and mild to moderate pulmonary edema.   Electronically Signed   By: Awilda Metro   On: 11/03/2013 05:29   Medications: Prescriptions prior to admission  Medication Sig Dispense  Refill  . aspirin EC 81 MG EC tablet Take 1 tablet (81 mg total) by mouth daily.      . Aspirin-Acetaminophen-Caffeine (EXCEDRIN MIGRAINE PO) Take by mouth as needed.      . bisoprolol (ZEBETA) 5 MG tablet Take 1 tablet (5 mg total) by mouth daily.  30 tablet  6  . lisinopril (PRINIVIL,ZESTRIL) 2.5 MG tablet Take 1 tablet (2.5 mg total) by mouth daily.  90 tablet  3  . ranitidine (ZANTAC) 150 MG tablet Take 150 mg by mouth 2 (two) times daily.      . simvastatin (ZOCOR) 10 MG tablet Take 1 tablet by mouth daily.       . sodium chloride 10 mL/hr at 11/03/13 1126  . amiodarone (NEXTERONE PREMIX) 360 mg/200 mL dextrose 30 mg/hr (11/04/13 1115)   PRN Meds:.sodium chloride, fentaNYL 2/23 echo  Study Conclusions  - Left ventricle: The cavity size was moderately dilated. Wall thickness was normal. The estimated ejection fraction was 15%. Diffuse hypokinesis with basal inferior aneurysm. No LV thrombus noted. Features are consistent with a pseudonormal left ventricular filling pattern, with concomitant abnormal relaxation and increased filling pressure (grade 2 diastolic dysfunction). - Aortic valve: There was no stenosis. Trivial regurgitation. - Mitral valve: There was a bioprosthetic mitral valve. There was no evidence for stenosis. No significant regurgitation. Mean gradient: 3mm Hg (D). - Left atrium: The atrium was moderately dilated. - Right ventricle: The cavity size was mildly dilated. Systolic function was moderately reduced. - Right atrium: The atrium was mildly dilated. - Tricuspid valve: Peak RV-RA gradient: 26mm Hg (S). - Pulmonary arteries: PA peak pressure: 41mm Hg (S). - Systemic veins: IVC measured 2.6 cm with < 50% respirophasic variation, suggesting RA pressure 15 mmHg. Impressions:  - Moderately dilated LV with severe systolic dysfunction, EF 15%. Global hypokinesis with basal inferior aneurysm. Mildly dilated RV with moderately decreased systolic function.  Bioprosthetic mitral valve appears to function normally. Mild pulmonary hypertension  11/01/13 cath  Procedural Findings:  Hemodynamics:  AO 112/68 mean of 86 mm Hg  LV 112/25 mm Hg  Coronary angiography:  Coronary dominance: right  Left mainstem: Normal.  Left anterior descending (LAD): The LAD has a high grade stenosis proximally and the vessel fills by the LIMA graft. The first diagonal is severely disease proximally and fills by the SVG graft.  Left circumflex (LCx): The LCx gives rise to 2 large OM branches then terminates in a smaller OM 3. Prior to the second OM there is an eccentric 70-80% stenosis.  Right coronary artery (RCA): The RCA is a large ectatic vessel proximally. It is diffusely diseased at the crux and occluded distally.  SVG to the diagonal is patent. In the mid LAD after graft insertion there is a focal 70% stenosis.  SVG to the PDA is patent and fills a large PLOM branch. There is a focal 50% ostial stenosis of the SVG.  LIMA graft to the LAD is widely patent.  Left ventriculography: Left ventricular systolic function is markedly abnormal. There is severe enlargement of the LV with akinesis and aneurysm of the entire inferior wall. There is severe global hypokinesis. LVEF is estimated at 15-20%. There is no significant mitral regurgitation, a bioprosthetic mitral valve is  in place.  Final Conclusions:  1. 3 vessel obstructive CAD.  2. Patent grafts including LIMA to the LAD, SVG to the diagonal, and SVG to the PDA.  3. Severe LV dysfunction.  Recommendations: There is no culprit lesion to explain his cardiac arrest. There is moderate obstructive disease in the diagonal and second OM but I think his arrest was predominantly related to arrhythmia and pump failure. Will transport to ICU and continue supportive therapy with ventilator, pressors, and hypothermia protocol.    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  Associated with bradycardia, severe 1st degree HB with bundle on EKG. PVCs on tele -With h/o CAD, ischemic cardiomyopathy -EF 15% down from 30-35% 08/2013.   -s/p LHC 2/22  #VDRF -CCM following possible extubation on 11/04/13   #HTN: pt with some elevated BP 2/2 agitation on SBT. Was only on lisinopril and bisporol outpt. But having some elevated Cr. Will re-eval after extubated.   #Concern for aspiration pna  -Unasyn. Noted leukocytosis 14.3  -pending cxs (resp, blood)  #thrombocytopenia improving  -could be 2/2 crtical illness  -trend CBC   LOS: 3 days   Christen Bame, MD  11/04/2013, 7:21 AM  The patient was seen, examined and discussed with Christen Bame, MD, IM resident and I agree with the above.   In summary, Mr Lemen is a 68 year old male with h/o CAD, S/P CABG in 01/2013 (LIMA to LAD, SVG to diagonal and PDA) + MVR (papillary muscle rupture), LVEF 30-35% with global hypokinesis and basal inferior and inferoseptal akinesis in 08/2013, refused ICD, and was admitted with out of hospital VF/VT arrest with prolonged CPR on 11/01/2013. Cath showed no culprit lesion, severe LV dysfunction (LVEF 15-20% with severe LV dilatation and inferior aneurysm) and moderate RV dysfunction . The patient underwent hypothermia that was completed yesterda, off Levophed and Dopamine were discontinued last night. He is waking up and seems to respond appropriately, he follows commands.  He is fluid overloaded, however his Crea increased after a small dose of Lasix 20 mg iv yesterday (1.4 --> 1.9), we will hold, CVP 15 this am. We will start low dose bisoprolol 2.5 mg po daily (he was using 5 mg at home).  Overall prognosis from cardiac standpoint is poor, we will consult CHF services for further therapies, possible initiation of  Milrinone and consideration for LVAD/Transplant.  We will also consult EP for ICD implantation.   Tobias Alexander, Rexene Edison  11/03/2013

## 2013-11-04 NOTE — Progress Notes (Signed)
Name: Lonnie [name] MRN: [id number] DOB: [birth year]    ADMISSION DATE:  11/01/2013 CONSULTATION DATE:  11/01/13  REFERRING MD :  Dr Harriett Rush, EDP PRIMARY SERVICE: s/p arrest  CHIEF COMPLAINT:  Cardiac arrest  BRIEF PATIENT DESCRIPTION: 67 yr known cad, mitral valve prolapse from papillary muscle rupture from MI  with MV replacement /bypass, found VF by wife.  SIGNIFICANT EVENTS / STUDIES:  2/22- VF arrest, cath lab 2/24- rewarming 2/25- followed commands  LINES / TUBES: 2/22 ETT>>> 2/22 left IJ>>> 2/22 a line>>>  CULTURES: 2/22 sputum>>> 2/22 BC>>> 2/23 cdiff>>>neg  ANTIBIOTICS: UNasyn 2/22>>>  SUBJECTIVE: agitation now, but followed commands  VITAL SIGNS: Temp:  [96.8 F (36 C)-100.6 F (38.1 C)] 99.3 F (37.4 C) (02/25 0751) Pulse Rate:  [72-100] 82 (02/25 0751) Resp:  [13-26] 26 (02/25 0600) BP: (99-215)/(50-101) 215/101 mmHg (02/25 0823) SpO2:  [96 %-100 %] 100 % (02/25 0600) Arterial Line BP: (93-186)/(48-88) 124/55 mmHg (02/25 0600) FiO2 (%):  [40 %] 40 % (02/25 0823) Weight:  [109.7 kg (241 lb 13.5 oz)] 109.7 kg (241 lb 13.5 oz) (02/25 0344) HEMODYNAMICS: CVP:  [7 mmHg-15 mmHg] 15 mmHg VENTILATOR SETTINGS: Vent Mode:  [-] PRVC FiO2 (%):  [40 %] 40 % Set Rate:  [26 bmp] 26 bmp Vt Set:  [560 mL] 560 mL PEEP:  [5 cmH20] 5 cmH20 Pressure Support:  [5 cmH20-10 cmH20] 5 cmH20 Plateau Pressure:  [18 cmH20-21 cmH20] 21 cmH20 INTAKE / OUTPUT: Intake/Output     02/24 0701 - 02/25 0700 02/25 0701 - 02/26 0700   I.V. (mL/kg) 798.8 (7.3) 26.7 (0.2)   Other 20    NG/GT 792.7    IV Piggyback 400    Total Intake(mL/kg) 2011.5 (18.3) 26.7 (0.2)   Urine (mL/kg/hr) 1635 (0.6) 175 (0.9)   Emesis/NG output 200 (0.1)    Stool     Total Output 1835 175   Net +176.5 -148.3        Emesis Occurrence 1 x      PHYSICAL EXAMINATION: General:  rass deep Neuro:  perrl 3 mm, rass -1 HEENT:  ett Cardiovascular:  s1 s2 RRT distant, no longer bardy Lungs:   ronchi resolved, slight coarse Abdomen:  Obese, nt, nd, no r Musculoskeletal:  Edema plus 1 Skin:  No rash  LABS:  CBC  Recent Labs Lab 11/01/13 1737  11/02/13 0031 11/03/13 0615 11/04/13 0350  WBC 16.1*  --   --  14.3* 11.1*  HGB 14.0  < > 15.6 15.1 12.6*  HCT 42.4  < > 46.0 42.2 36.8*  PLT 166  --   --  106* 106*  < > = values in this interval not displayed. Coag's  Recent Labs Lab 11/01/13 1737 11/02/13 0135  APTT 25 25  INR 1.14 1.10   BMET  Recent Labs Lab 11/03/13 0615 11/03/13 1013 11/04/13 0350  NA 141 142 142  K 3.9 4.6 4.2  CL 107 107 106  CO2 17* 17* 18*  BUN 27* 27* 38*  CREATININE 1.34 1.41* 1.96*  GLUCOSE 121* 120* 158*   Electrolytes  Recent Labs Lab 11/02/13 0803  11/03/13 0615 11/03/13 1013 11/04/13 0350  CALCIUM  --   < > 8.0* 8.0* 8.1*  MG 1.7  --   --   --  2.2  < > = values in this interval not displayed. Sepsis Markers  Recent Labs Lab 11/01/13 2226 11/03/13 0615  LATICACIDVEN 3.4* 1.6   ABG  Recent Labs Lab 11/01/13 2243  11/02/13 1127 11/03/13 0430  PHART 7.284* 7.349* 7.397  PCO2ART 31.0* 26.2* 28.8*  PO2ART 261.0* 66.0* 73.3*   Liver Enzymes  Recent Labs Lab 11/03/13 0615 11/04/13 0350  AST 67* 39*  ALT 64* 53  ALKPHOS 86 81  BILITOT 1.0 0.9  ALBUMIN 2.8* 2.6*   Cardiac Enzymes No results found for this basename: TROPONINI, PROBNP,  in the last 168 hours Glucose  Recent Labs Lab 11/03/13 1114 11/03/13 1533 11/03/13 2050 11/04/13 0004 11/04/13 0336 11/04/13 0758  GLUCAP 97 146* 137* 168* 138* 137*    Imaging Dg Chest Port 1 View  11/04/2013   CLINICAL DATA:  Ventilator dependence.  EXAM: PORTABLE CHEST - 1 VIEW  COMPARISON:  Multiple recent previous exams.  FINDINGS: 0719 hrs. Endotracheal tube tip is 3.1 cm above the base of the carina. The left IJ central venous catheter tip overlies the expected location of the innominate vein confluence. The tip may be directed against the lateral wall  of the vessel. The NG tube passes into the stomach although the distal tip position is not included on the film.  The cardio pericardial silhouette is enlarged. The patient is status post CABG and cardiac valve replacement. Pulmonary vascular congestion persists. Right parahilar atelectasis noted.  IMPRESSION: No substantial interval change in exam. Cardiomegaly with vascular congestion and probable interstitial edema.  Right parahilar atelectasis.   Electronically Signed   By: Kennith CenterEric  Mansell M.D.   On: 11/04/2013 07:36   Dg Chest Port 1 View  11/03/2013   CLINICAL DATA:  Follow-up pneumonia, shortness of breath.  EXAM: PORTABLE CHEST - 1 VIEW  COMPARISON:  Chest radiograph November 01, 1998 and the  FINDINGS: Endotracheal tube tip projects 3.1 cm above the carina. Nasogastric tube at the gastroesophageal junction the distal tip is not imaged. Catheter projects over the lower chest. Left internal jugular central venous catheter with distal tip projecting in proximal superior vena cava. Pacer pad over the heart.  The cardiac silhouette appears at least mildly enlarged, status post sternotomy for cardiac valve replacement. Similar central pulmonary vasculature fullness and interstitial prominence, no definite pleural effusions the the left costophrenic angle is incompletely imaged. No pneumothorax.  Multiple EKG lines overlie the patient and may obscure subtle underlying pathology.  IMPRESSION: No apparent change in position of life-support lines.  Stable cardiomegaly and mild to moderate pulmonary edema.   Electronically Signed   By: Awilda Metroourtnay  Bloomer   On: 11/03/2013 05:29     CXR: improved infiltrates, ett wnl, line wnl  ASSESSMENT / PLAN:  PULMONARY A:Acute respiratory failure, concern aspiration P:   Weaning this am , cpap 5 ps 5, goal 1 hr, assess rsbi pcxr improved, re eval in am Hold lasix further  CARDIOVASCULAR A: VF arrest, cad, s/p MV replacement. Cardiogenic shock resolved P:  Per  cards amio continued Levophed off Map goal 60, successful on own  RENAL A:  High risk ATN, ARF, fro lasix?      Metabolic acidosis +AG, residual uremia now?  P:   bmet in am  Dc lasix Allow pos balance Recheck LA - re assuring  GASTROINTESTINAL A:  Abdominal distention, secondary to bagging, king airway, at risk bowel ischemia - stools resolved 2/24 P:   ppi Start TF, vital, now off, weaning  HEMATOLOGIC A:  Leukocytosis, DVT prevention P:  Sub q heparin Cbc in am for plat count scd   INFECTIOUS A:  Aspiration likely, lose stools (Cdfiff neg) resolved P:   Unasyn, x 5 days  then dc  ENDOCRINE A:  At risk hyperglycemia P:   ssi  NEUROLOGIC A:  S/p Arrest,followed commands P:   WUA  Avoid fevers Goal MAP 60  TODAY'S SUMMARY:  Weaning , stop date unasyn, pcxr improved, rise crt, dc lasix  I have personally obtained a history, examined the patient, evaluated laboratory and imaging results, formulated the assessment and plan and placed orders. CRITICAL CARE: The patient is critically ill with multiple organ systems failure and requires high complexity decision making for assessment and support, frequent evaluation and titration of therapies, application of advanced monitoring technologies and extensive interpretation of multiple databases. Critical Care Time devoted to patient care services described in this note is 30 minutes.   11/04/2013, 8:43 AM  Mcarthur Rossetti. Tyson Alias, MD, FACP Pgr: [phone] [facility]

## 2013-11-04 NOTE — Progress Notes (Signed)
Pt with strong cough, pt vomited green bile. Dr. Henrene PastorMcquiad notified. Tube feeding stopped at this time. OG tube placed to low wall suction for risk of aspiration. Pt medicated with Fentanyl 50 mcg. Will continue to monitor.

## 2013-11-05 ENCOUNTER — Inpatient Hospital Stay (HOSPITAL_COMMUNITY): Payer: Managed Care, Other (non HMO)

## 2013-11-05 DIAGNOSIS — I5023 Acute on chronic systolic (congestive) heart failure: Secondary | ICD-10-CM

## 2013-11-05 DIAGNOSIS — I2589 Other forms of chronic ischemic heart disease: Secondary | ICD-10-CM

## 2013-11-05 LAB — CBC WITH DIFFERENTIAL/PLATELET
BASOS PCT: 0 % (ref 0–1)
Basophils Absolute: 0 10*3/uL (ref 0.0–0.1)
EOS PCT: 0 % (ref 0–5)
Eosinophils Absolute: 0 10*3/uL (ref 0.0–0.7)
HCT: 34.5 % — ABNORMAL LOW (ref 39.0–52.0)
HEMOGLOBIN: 11.8 g/dL — AB (ref 13.0–17.0)
LYMPHS ABS: 1 10*3/uL (ref 0.7–4.0)
Lymphocytes Relative: 9 % — ABNORMAL LOW (ref 12–46)
MCH: 31.1 pg (ref 26.0–34.0)
MCHC: 34.2 g/dL (ref 30.0–36.0)
MCV: 91 fL (ref 78.0–100.0)
MONOS PCT: 9 % (ref 3–12)
Monocytes Absolute: 1.1 10*3/uL — ABNORMAL HIGH (ref 0.1–1.0)
NEUTROS PCT: 82 % — AB (ref 43–77)
Neutro Abs: 9.5 10*3/uL — ABNORMAL HIGH (ref 1.7–7.7)
PLATELETS: 108 10*3/uL — AB (ref 150–400)
RBC: 3.79 MIL/uL — AB (ref 4.22–5.81)
RDW: 14.9 % (ref 11.5–15.5)
WBC: 11.6 10*3/uL — ABNORMAL HIGH (ref 4.0–10.5)

## 2013-11-05 LAB — COMPREHENSIVE METABOLIC PANEL
ALBUMIN: 2.7 g/dL — AB (ref 3.5–5.2)
ALT: 44 U/L (ref 0–53)
AST: 31 U/L (ref 0–37)
Alkaline Phosphatase: 71 U/L (ref 39–117)
BILIRUBIN TOTAL: 1 mg/dL (ref 0.3–1.2)
BUN: 35 mg/dL — ABNORMAL HIGH (ref 6–23)
CHLORIDE: 111 meq/L (ref 96–112)
CO2: 23 meq/L (ref 19–32)
Calcium: 8.5 mg/dL (ref 8.4–10.5)
Creatinine, Ser: 1.7 mg/dL — ABNORMAL HIGH (ref 0.50–1.35)
GFR calc Af Amer: 46 mL/min — ABNORMAL LOW (ref >90)
GFR, EST NON AFRICAN AMERICAN: 40 mL/min — AB (ref >90)
Glucose, Bld: 112 mg/dL — ABNORMAL HIGH (ref 70–99)
Potassium: 3.9 mEq/L (ref 3.7–5.3)
SODIUM: 146 meq/L (ref 137–147)
Total Protein: 6 g/dL (ref 6.0–8.3)

## 2013-11-05 LAB — CARBOXYHEMOGLOBIN
Carboxyhemoglobin: 0.4 % — ABNORMAL LOW (ref 0.5–1.5)
Methemoglobin: 1.2 % (ref 0.0–1.5)
O2 SAT: 84.4 %
TOTAL HEMOGLOBIN: 12.3 g/dL — AB (ref 13.5–18.0)

## 2013-11-05 LAB — POCT I-STAT 3, ART BLOOD GAS (G3+)
ACID-BASE DEFICIT: 5 mmol/L — AB (ref 0.0–2.0)
Acid-base deficit: 5 mmol/L — ABNORMAL HIGH (ref 0.0–2.0)
Bicarbonate: 20.8 mEq/L (ref 20.0–24.0)
Bicarbonate: 20 mEq/L (ref 20.0–24.0)
O2 SAT: 98 %
O2 Saturation: 100 %
PCO2 ART: 36 mmHg (ref 35.0–45.0)
PH ART: 7.354 (ref 7.350–7.450)
PO2 ART: 289 mmHg — AB (ref 80.0–100.0)
Patient temperature: 98.7
TCO2: 22 mmol/L (ref 0–100)
TCO2: 21 mmol/L (ref 0–100)
pCO2 arterial: 42.7 mmHg (ref 35.0–45.0)
pH, Arterial: 7.296 — ABNORMAL LOW (ref 7.350–7.450)
pO2, Arterial: 114 mmHg — ABNORMAL HIGH (ref 80.0–100.0)

## 2013-11-05 LAB — GLUCOSE, CAPILLARY
GLUCOSE-CAPILLARY: 109 mg/dL — AB (ref 70–99)
GLUCOSE-CAPILLARY: 118 mg/dL — AB (ref 70–99)
GLUCOSE-CAPILLARY: 88 mg/dL (ref 70–99)
Glucose-Capillary: 116 mg/dL — ABNORMAL HIGH (ref 70–99)
Glucose-Capillary: 92 mg/dL (ref 70–99)
Glucose-Capillary: 93 mg/dL (ref 70–99)
Glucose-Capillary: 98 mg/dL (ref 70–99)

## 2013-11-05 MED ORDER — ASPIRIN 81 MG PO CHEW
81.0000 mg | CHEWABLE_TABLET | Freq: Every day | ORAL | Status: DC
  Administered 2013-11-05 – 2013-11-13 (×9): 81 mg
  Filled 2013-11-05 (×8): qty 1

## 2013-11-05 MED ORDER — ATORVASTATIN CALCIUM 80 MG PO TABS
80.0000 mg | ORAL_TABLET | Freq: Every day | ORAL | Status: DC
  Administered 2013-11-05 – 2013-11-12 (×8): 80 mg
  Filled 2013-11-05 (×10): qty 1

## 2013-11-05 MED ORDER — HYDRALAZINE HCL 20 MG/ML IJ SOLN
10.0000 mg | Freq: Four times a day (QID) | INTRAMUSCULAR | Status: DC | PRN
  Administered 2013-11-05 – 2013-11-06 (×5): 10 mg via INTRAVENOUS
  Filled 2013-11-05 (×5): qty 1

## 2013-11-05 MED ORDER — ISOSORBIDE MONONITRATE ER 30 MG PO TB24
30.0000 mg | ORAL_TABLET | Freq: Every day | ORAL | Status: DC
  Administered 2013-11-05 – 2013-11-13 (×9): 30 mg via ORAL
  Filled 2013-11-05 (×9): qty 1

## 2013-11-05 MED ORDER — HYDRALAZINE HCL 25 MG PO TABS
25.0000 mg | ORAL_TABLET | Freq: Three times a day (TID) | ORAL | Status: DC
  Administered 2013-11-05 – 2013-11-06 (×3): 25 mg via ORAL
  Filled 2013-11-05 (×7): qty 1

## 2013-11-05 MED ORDER — POTASSIUM CHLORIDE 20 MEQ/15ML (10%) PO LIQD
ORAL | Status: AC
  Administered 2013-11-05: 40 meq
  Filled 2013-11-05: qty 30

## 2013-11-05 MED ORDER — POTASSIUM CHLORIDE 20 MEQ/15ML (10%) PO LIQD
40.0000 meq | Freq: Once | ORAL | Status: DC
  Filled 2013-11-05: qty 30

## 2013-11-05 MED ORDER — FUROSEMIDE 10 MG/ML IJ SOLN
40.0000 mg | Freq: Two times a day (BID) | INTRAMUSCULAR | Status: DC
  Administered 2013-11-05 – 2013-11-07 (×6): 40 mg via INTRAVENOUS
  Filled 2013-11-05 (×9): qty 4

## 2013-11-05 NOTE — Progress Notes (Addendum)
Name: Lonnie [name] MRN: [id number] DOB: Jun 08, 1946    ADMISSION DATE:  11/01/2013 CONSULTATION DATE:  11/01/13  REFERRING MD :  Dr Harriett Rush, EDP PRIMARY SERVICE: s/p arrest  CHIEF COMPLAINT:  Cardiac arrest  BRIEF PATIENT DESCRIPTION: 67 yr known cad and LVEF 30%, mitral valve prolapse from papillary muscle rupture from MI with MV replacement /bypass, found VF by wife.  SIGNIFICANT EVENTS / STUDIES:  2/22- VF arrest, cath lab 2/24- rewarming 2/25- followed commands  LINES / TUBES: 2/22 ETT>>> 2/22 left IJ>>> 2/22 a line>>>  CULTURES: 2/22 sputum>>> 2/22 BC>>> 2/23 cdiff>>>neg  ANTIBIOTICS: Unasyn 2/22>>> 2/26  SUBJECTIVE:  Tolerating PSV Following commands  VITAL SIGNS: Temp:  [98.4 F (36.9 C)-99.3 F (37.4 C)] 98.6 F (37 C) (02/26 0800) Pulse Rate:  [59-88] 73 (02/26 1000) Resp:  [11-24] 18 (02/26 1000) BP: (113-167)/(53-93) 163/71 mmHg (02/26 0825) SpO2:  [97 %-100 %] 99 % (02/26 1000) Arterial Line BP: (122-211)/(52-92) 158/66 mmHg (02/26 1000) FiO2 (%):  [30 %-40 %] 30 % (02/26 0825) Weight:  [107.8 kg (237 lb 10.5 oz)] 107.8 kg (237 lb 10.5 oz) (02/26 0500) HEMODYNAMICS: CVP:  [8 mmHg-15 mmHg] 12 mmHg VENTILATOR SETTINGS: Vent Mode:  [-] CPAP;PSV FiO2 (%):  [30 %-40 %] 30 % Set Rate:  [20 bmp] 20 bmp Vt Set:  [560 mL] 560 mL PEEP:  [5 cmH20] 5 cmH20 Pressure Support:  [5 cmH20-8 cmH20] 5 cmH20 Plateau Pressure:  [15 cmH20-18 cmH20] 18 cmH20 INTAKE / OUTPUT: Intake/Output     02/25 0701 - 02/26 0700 02/26 0701 - 02/27 0700   I.V. (mL/kg) 664.1 (6.2) 80.1 (0.7)   Other     NG/GT 30 100   IV Piggyback 400 100   Total Intake(mL/kg) 1094.1 (10.1) 280.1 (2.6)   Urine (mL/kg/hr) 1535 (0.6) 625 (1.6)   Emesis/NG output 25 (0)    Total Output 1560 625   Net -465.9 -344.9          PHYSICAL EXAMINATION: General: intubated, ill appearing Neuro:  perrl 3 mm, rass -1 HEENT:  ett Cardiovascular:  s1 s2 RRT distant, no longer bardy Lungs:   ronchi resolved, slight coarse Abdomen:  Obese, nt, nd, no r Musculoskeletal:  Edema plus 1 Skin:  No rash  LABS:  CBC  Recent Labs Lab 11/03/13 0615 11/04/13 0350 11/05/13 0425  WBC 14.3* 11.1* 11.6*  HGB 15.1 12.6* 11.8*  HCT 42.2 36.8* 34.5*  PLT 106* 106* 108*   Coag's  Recent Labs Lab 11/01/13 1737 11/02/13 0135  APTT 25 25  INR 1.14 1.10   BMET  Recent Labs Lab 11/03/13 1013 11/04/13 0350 11/05/13 0425  NA 142 142 146  K 4.6 4.2 3.9  CL 107 106 111  CO2 17* 18* 23  BUN 27* 38* 35*  CREATININE 1.41* 1.96* 1.70*  GLUCOSE 120* 158* 112*   Electrolytes  Recent Labs Lab 11/02/13 0803  11/03/13 1013 11/04/13 0350 11/05/13 0425  CALCIUM  --   < > 8.0* 8.1* 8.5  MG 1.7  --   --  2.2  --   < > = values in this interval not displayed. Sepsis Markers  Recent Labs Lab 11/01/13 2226 11/03/13 0615  LATICACIDVEN 3.4* 1.6   ABG  Recent Labs Lab 11/03/13 0430 11/04/13 0934 11/05/13 0629  PHART 7.397 7.296* 7.354  PCO2ART 28.8* 42.7 36.0  PO2ART 73.3* 114.0* 289.0*   Liver Enzymes  Recent Labs Lab 11/03/13 0615 11/04/13 0350 11/05/13 0425  AST 67* 39* 31  ALT 64* 53 44  ALKPHOS 86 81 71  BILITOT 1.0 0.9 1.0  ALBUMIN 2.8* 2.6* 2.7*   Cardiac Enzymes No results found for this basename: TROPONINI, PROBNP,  in the last 168 hours Glucose  Recent Labs Lab 11/04/13 1216 11/04/13 1602 11/04/13 1946 11/04/13 2327 11/05/13 0332 11/05/13 0741  GLUCAP 109* 118* 119* 121* 116* 109*    Imaging Dg Chest Port 1 View  11/05/2013   CLINICAL DATA:  Evaluate endotracheal tube. Evaluate pulmonary edema.  EXAM: PORTABLE CHEST - 1 VIEW  COMPARISON:  DG CHEST 1V PORT dated 11/04/2013  FINDINGS: Endotracheal tube is appropriately positioned, terminating 3.7 cm above carina. Mitral valve repair. Nasogastric tube is poorly visualized distally, but likely extends beyond the inferior aspect of the film. Left internal jugular line terminates at the mid SVC.   Cardiomegaly accentuated by AP portable technique. Right hemidiaphragm elevation. No pleural effusion or pneumothorax. Inferior right upper lobe airspace disease is unchanged. Worsened left base aeration with developing consolidation. Mild interstitial edema is similar.  IMPRESSION: Worsened left base aeration, suspicious for atelectasis or infection.  Mild interstitial edema with similar right upper lobe airspace disease, favored to represent atelectasis.   Electronically Signed   By: Jeronimo Greaves M.D.   On: 11/05/2013 07:40   Dg Chest Port 1 View  11/04/2013   CLINICAL DATA:  Ventilator dependence.  EXAM: PORTABLE CHEST - 1 VIEW  COMPARISON:  Multiple recent previous exams.  FINDINGS: 0719 hrs. Endotracheal tube tip is 3.1 cm above the base of the carina. The left IJ central venous catheter tip overlies the expected location of the innominate vein confluence. The tip may be directed against the lateral wall of the vessel. The NG tube passes into the stomach although the distal tip position is not included on the film.  The cardio pericardial silhouette is enlarged. The patient is status post CABG and cardiac valve replacement. Pulmonary vascular congestion persists. Right parahilar atelectasis noted.  IMPRESSION: No substantial interval change in exam. Cardiomegaly with vascular congestion and probable interstitial edema.  Right parahilar atelectasis.   Electronically Signed   By: Kennith Center M.D.   On: 11/04/2013 07:36     CXR: some increased LLL atx  ASSESSMENT / PLAN:  PULMONARY A: Acute respiratory failure, concern aspiration P:   Attempt extubation 2/26 if he tolerates T-piece trial   CARDIOVASCULAR A: VF arrest,  cad,  s/p MV replacement.  Cardiogenic shock resolved P:  Per cards will likely need AICD (had refused before) B-blocker, ACE-I, lasix restarted amiodarone  RENAL A:  High risk ATN, ARF, improving      Metabolic acidosis +AG,  P:   Follow BMP Dc lasix Allow  slight pos balance  GASTROINTESTINAL A:  Abdominal distention, secondary to bagging, king airway, at risk bowel ischemia - resolved 2/24 P:   ppi Hold TF's in prep for extubation  HEMATOLOGIC A:  Leukocytosis, DVT prevention P:  Sub q heparin scd  Follow CBC   INFECTIOUS A:  Possible Aspiration Diarrhea, C diff negative, improved P:   Unasyn, x 5 days then dc > stopped 2/26  ENDOCRINE A:  At risk hyperglycemia P:   ssi  NEUROLOGIC A:  S/p Arrest, followed commands P:   WUA  Goal MAP 60  TODAY'S SUMMARY:  VT arrest, cardiogenic shock and VDRF. Goal extubation 2/26  I have personally obtained a history, examined the patient, evaluated laboratory and imaging results, formulated the assessment and plan and placed orders.  CRITICAL CARE: The  patient is critically ill with multiple organ systems failure and requires high complexity decision making for assessment and support, frequent evaluation and titration of therapies, application of advanced monitoring technologies and extensive interpretation of multiple databases. Critical Care Time devoted to patient care services described in this note is 40 minutes.   Levy Pupaobert Dandrea Medders, MD, PhD 11/05/2013, 10:47 AM [facility] [phone] or if no answer [phone]

## 2013-11-05 NOTE — Progress Notes (Signed)
Pt tolerated ice chips and apple sauce without coughing/choking. PO meds given with apple sauce without problem

## 2013-11-05 NOTE — Procedures (Signed)
Extubation Procedure Note  Patient Details:   Name: Bethann BerkshireRobert L Vossler DOB: 08-16-1946 MRN: [id number]   Airway Documentation:     Evaluation  O2 sats: stable throughout Complications: No apparent complications Patient did tolerate procedure well. Bilateral Breath Sounds: Diminished Suctioning: Airway Yes  Patient extubated to 4 LNC at this time. Positive cuff leak. Patient able to vocalize and clear secretions.  RT will continue to monitor.  Lurlean LeydenDick, Alysha Doolan Bailey 11/05/2013, 11:05 AM

## 2013-11-05 NOTE — Progress Notes (Addendum)
Subjective: Tracks and opens eyes to command on vent.   Tele: NSR  Objective: Vital signs in last 24 hours: Filed Vitals:   11/05/13 0300 11/05/13 0316 11/05/13 0400 11/05/13 0500  BP: 116/59 116/59 121/60   Pulse: 63 68 61   Temp:  98.5 F (36.9 C)    TempSrc:  Axillary    Resp: 20 18 20    Height:      Weight:    237 lb 10.5 oz (107.8 kg)  SpO2: 100% 100% 100%    Weight change: -4 lb 3 oz (-1.9 kg)  Intake/Output Summary (Last 24 hours) at 11/05/13 0734 Last data filed at 11/05/13 0500  Gross per 24 hour  Intake 1040.7 ml  Output   1560 ml  Net -519.3 ml   Vitals reviewed. General: resting in bed, NAD, intubated/sedated HEENT: Virgil/at, no scleral icterus] Neck: JVP 12 cm Cardiac: RRR, no rubs, murmurs or gallops Pulm: clear to auscultation bilaterally, no wheezes, rales, or rhonchi Abd: soft, nontender, nondistended, BS present Ext: warm and well perfused, 1+ pedal edema Neuro: intubated/sedated  Lab Results: Basic Metabolic Panel:  Recent Labs Lab 11/02/13 0803  11/04/13 0350 11/05/13 0425  NA  --   < > 142 146  K  --   < > 4.2 3.9  CL  --   < > 106 111  CO2  --   < > 18* 23  GLUCOSE  --   < > 158* 112*  BUN  --   < > 38* 35*  CREATININE  --   < > 1.96* 1.70*  CALCIUM  --   < > 8.1* 8.5  MG 1.7  --  2.2  --   < > = values in this interval not displayed. Liver Function Tests:  Recent Labs Lab 11/04/13 0350 11/05/13 0425  AST 39* 31  ALT 53 44  ALKPHOS 81 71  BILITOT 0.9 1.0  PROT 6.0 6.0  ALBUMIN 2.6* 2.7*   CBC:  Recent Labs Lab 11/03/13 0615 11/04/13 0350 11/05/13 0425  WBC 14.3* 11.1* 11.6*  NEUTROABS 12.4*  --  9.5*  HGB 15.1 12.6* 11.8*  HCT 42.2 36.8* 34.5*  MCV 87.6 89.8 91.0  PLT 106* 106* 108*   CBG:  Recent Labs Lab 11/04/13 0758 11/04/13 1216 11/04/13 1602 11/04/13 1946 11/04/13 2327 11/05/13 0332  GLUCAP 137* 109* 118* 119* 121* 116*   Fasting Lipid Panel:  Recent Labs Lab 11/01/13 2226  TRIG 76    Thyroid Function Tests:  Recent Labs Lab 11/01/13 2226  TSH 4.022   Coagulation:  Recent Labs Lab 11/01/13 1737 11/02/13 0135  LABPROT 14.4 14.0  INR 1.14 1.10    Misc. Labs: Fecal lactoferrin  Micro Results: Recent Results (from the past 240 hour(s))  MRSA PCR SCREENING     Status: None   Collection Time    11/01/13  8:14 PM      Result Value Ref Range Status   MRSA by PCR NEGATIVE  NEGATIVE Final   Comment:            The GeneXpert MRSA Assay (FDA     approved for NASAL specimens     only), is one component of a     comprehensive MRSA colonization     surveillance program. It is not     intended to diagnose MRSA     infection nor to guide or     monitor treatment for     MRSA infections.  CULTURE, BLOOD (  ROUTINE X 2)     Status: None   Collection Time    11/01/13 10:15 PM      Result Value Ref Range Status   Specimen Description BLOOD LEFT HAND   Final   Special Requests BOTTLES DRAWN AEROBIC ONLY 5CC   Final   Culture  Setup Time     Final   Value: 11/02/2013 00:44     Performed at [facility]   Culture     Final   Value:        BLOOD CULTURE RECEIVED NO GROWTH TO DATE CULTURE WILL BE HELD FOR 5 DAYS BEFORE ISSUING A FINAL NEGATIVE REPORT     Performed at [facility]   Report Status PENDING   Incomplete  CULTURE, BLOOD (ROUTINE X 2)     Status: None   Collection Time    11/01/13 10:20 PM      Result Value Ref Range Status   Specimen Description BLOOD RIGHT HAND   Final   Special Requests BOTTLES DRAWN AEROBIC ONLY 5CC   Final   Culture  Setup Time     Final   Value: 11/02/2013 00:44     Performed at [facility]   Culture     Final   Value:        BLOOD CULTURE RECEIVED NO GROWTH TO DATE CULTURE WILL BE HELD FOR 5 DAYS BEFORE ISSUING A FINAL NEGATIVE REPORT     Performed at [facility]   Report Status PENDING   Incomplete  CULTURE, RESPIRATORY (NON-EXPECTORATED)     Status: None   Collection Time    11/01/13  11:11 PM      Result Value Ref Range Status   Specimen Description TRACHEAL ASPIRATE   Final   Special Requests NONE   Final   Gram Stain     Final   Value: RARE WBC PRESENT,BOTH PMN AND MONONUCLEAR     NO SQUAMOUS EPITHELIAL CELLS SEEN     MODERATE GRAM POSITIVE COCCI     IN PAIRS IN CLUSTERS FEW GRAM NEGATIVE COCCI     RARE GRAM NEGATIVE RODS   Culture     Final   Value: Non-Pathogenic Oropharyngeal-type Flora Isolated.     Performed at [facility]   Report Status 11/04/2013 FINAL   Final  CLOSTRIDIUM DIFFICILE BY PCR     Status: None   Collection Time    11/02/13  3:15 AM      Result Value Ref Range Status   C difficile by pcr NEGATIVE  NEGATIVE Final   Studies/Results: Dg Chest Port 1 View  11/04/2013   CLINICAL DATA:  Ventilator dependence.  EXAM: PORTABLE CHEST - 1 VIEW  COMPARISON:  Multiple recent previous exams.  FINDINGS: 0719 hrs. Endotracheal tube tip is 3.1 cm above the base of the carina. The left IJ central venous catheter tip overlies the expected location of the innominate vein confluence. The tip may be directed against the lateral wall of the vessel. The NG tube passes into the stomach although the distal tip position is not included on the film.  The cardio pericardial silhouette is enlarged. The patient is status post CABG and cardiac valve replacement. Pulmonary vascular congestion persists. Right parahilar atelectasis noted.  IMPRESSION: No substantial interval change in exam. Cardiomegaly with vascular congestion and probable interstitial edema.  Right parahilar atelectasis.   Electronically Signed   By: Kennith Center M.D.   On: 11/04/2013 07:36   Medications:  Prescriptions prior to admission  Medication Sig Dispense Refill  . aspirin EC 81 MG EC tablet Take 1 tablet (81 mg total) by mouth daily.      . Aspirin-Acetaminophen-Caffeine (EXCEDRIN MIGRAINE PO) Take by mouth as needed.      . bisoprolol (ZEBETA) 5 MG tablet Take 1 tablet (5 mg total) by mouth  daily.  30 tablet  6  . lisinopril (PRINIVIL,ZESTRIL) 2.5 MG tablet Take 1 tablet (2.5 mg total) by mouth daily.  90 tablet  3  . ranitidine (ZANTAC) 150 MG tablet Take 150 mg by mouth 2 (two) times daily.      . simvastatin (ZOCOR) 10 MG tablet Take 1 tablet by mouth daily.       . sodium chloride 10 mL/hr at 11/03/13 1126  . amiodarone (NEXTERONE PREMIX) 360 mg/200 mL dextrose 30 mg/hr (11/04/13 1115)   PRN Meds:.sodium chloride, fentaNYL 2/23 echo  Study Conclusions  - Left ventricle: The cavity size was moderately dilated. Wall thickness was normal. The estimated ejection fraction was 15%. Diffuse hypokinesis with basal inferior aneurysm. No LV thrombus noted. Features are consistent with a pseudonormal left ventricular filling pattern, with concomitant abnormal relaxation and increased filling pressure (grade 2 diastolic dysfunction). - Aortic valve: There was no stenosis. Trivial regurgitation. - Mitral valve: There was a bioprosthetic mitral valve. There was no evidence for stenosis. No significant regurgitation. Mean gradient: 3mm Hg (D). - Left atrium: The atrium was moderately dilated. - Right ventricle: The cavity size was mildly dilated. Systolic function was moderately reduced. - Right atrium: The atrium was mildly dilated. - Tricuspid valve: Peak RV-RA gradient: 26mm Hg (S). - Pulmonary arteries: PA peak pressure: 41mm Hg (S). - Systemic veins: IVC measured 2.6 cm with < 50% respirophasic variation, suggesting RA pressure 15 mmHg. Impressions:  - Moderately dilated LV with severe systolic dysfunction, EF 15%. Global hypokinesis with basal inferior aneurysm. Mildly dilated RV with moderately decreased systolic function. Bioprosthetic mitral valve appears to function normally. Mild pulmonary hypertension  11/01/13 cath  Procedural Findings:  Hemodynamics:  AO 112/68 mean of 86 mm Hg  LV 112/25 mm Hg  Coronary angiography:  Coronary dominance: right  Left  mainstem: Normal.  Left anterior descending (LAD): The LAD has a high grade stenosis proximally and the vessel fills by the LIMA graft. The first diagonal is severely disease proximally and fills by the SVG graft.  Left circumflex (LCx): The LCx gives rise to 2 large OM branches then terminates in a smaller OM 3. Prior to the second OM there is an eccentric 70-80% stenosis.  Right coronary artery (RCA): The RCA is a large ectatic vessel proximally. It is diffusely diseased at the crux and occluded distally.  SVG to the diagonal is patent. In the mid LAD after graft insertion there is a focal 70% stenosis.  SVG to the PDA is patent and fills a large PLOM branch. There is a focal 50% ostial stenosis of the SVG.  LIMA graft to the LAD is widely patent.  Left ventriculography: Left ventricular systolic function is markedly abnormal. There is severe enlargement of the LV with akinesis and aneurysm of the entire inferior wall. There is severe global hypokinesis. LVEF is estimated at 15-20%. There is no significant mitral regurgitation, a bioprosthetic mitral valve is in place.  Final Conclusions:  1. 3 vessel obstructive CAD.  2. Patent grafts including LIMA to the LAD, SVG to the diagonal, and SVG to the PDA.  3. Severe LV dysfunction.  Recommendations:  There is no culprit lesion to explain his cardiac arrest. There is moderate obstructive disease in the diagonal and second OM but I think his arrest was predominantly related to arrhythmia and pump failure. Will transport to ICU and continue supportive therapy with ventilator, pressors, and hypothermia protocol.    Assessment/Plan: 68 y.o PMH HLD, HTN, CAD, ischemic cardiomyopathy, s/p CABG (LIMA to LAD, SVG to diagonal and PDA), MVP from rupture of papillary muscle from MI s/p MV repair, MR presented 2/22 for cardiac arrest  #Cardiac arrest s/p LHC 2/22 -with hypothermia on protocol and warmed on 11/04/13. Pt following commands. EF 15% down from 30-35%  08/2013.   -will need ICD evaluation -CHF to consult and give recs given extremely poor function  #VDRF -CCM following possible extubation on 11/05/13   #HTN: started bisoprolol 2.5mg  (1/2 home dose) and lisinopril 2.5mg  (1/2 dose)   #Concern for aspiration pna  -Unasyn. Completed on 11/05/13 -pending cxs (resp, blood)   LOS: 4 days   Christen Bame, MD  11/05/2013, 7:34 AM  Patient seen with resident.  He is stable this morning.  Still intubated but awake and following commands.  1. Cardiac arrest: Suspect ventricular fibrillation, scar-related.  Status post hypothermia, still intubated.  He will need ICD => EP to see (not CRT candidate with narrow QRS).  He is currently on amiodarone.  Likely will try to get him off this and on a higher dose of beta blocker eventually.  2. CAD: s/p CABG.  Grafts patent on LHC this admission, suspect cardiac arrest was scar-related rather than ischemia related.  Restart ASA 81 and statin.  3. Acute on chronic systolic CHF: EF 40% with RV dysfunction.  Co-ox was good yesterday.  CVP 12 this morning with JVD on exam.  BP stable. Creatinine coming down.  - At this point, do not think he needs inotrope. Would diurese with Lasix 40 mg IV bid today and follow UOP and creatinine.  - Repeat co-ox today.  - Add hydralazine/nitrates for afterload reduction (hold off on ACEI with AKI).  - Continue current bisoprolol.  Eventually would like to titrate this up.  4. Respiratory failure: Vent wean today, hopefully will be extubated.  5. AKI on CKD: Creatinine better today.  Suspect hemodynamically-mediated, related to cardiac arrest/hypotension.   Marca Ancona 11/05/2013 8:39 AM

## 2013-11-05 NOTE — Consult Note (Signed)
ELECTROPHYSIOLOGY CONSULT NOTE    Referring Physician:  Dr Shirlee LatchMcLean Primary Cardiologist:  Dr Jacqulynn CadetHochrein  Lonnie [name] is a 68 y.o. male with a h/o an ischemic CM (EF 30%), NYHA Class III CHF, and prior mitral valve replacement now admitted with ventricular fibrillation arrest.  He has been followed by Dr Antoine PocheHochrein for a long time with optimization of medicines for CHF performed.  He was revascularized and underwent mitral valve replacement 5/14.  Despite optimal medical therapy, his EF remains chronically depressed. He is now admitted after being found unresponsive in a chair by his spouse.  EMS was called and upon their arrival (per report), he was in ventricular tachycardia and required defibrillation in the field.  He had ROSC after 7 minutes of CPR by EMS.  His total down time is not known.  He underwent emergent cath by Dr SwazilandJordan which did not reveal a culprit lesion to explain his event.  He was intubated/ cooled and subsequently extubated earlier this am. Presently, the patient remains confused/ disoriented and is unable to provide history.  Past Medical History  Diagnosis Date  . High cholesterol   . Hypertension   . Constipation   . Reflux   . Complication of anesthesia     "Hard time waking me up" after sedation after dental procedure  . CAD (coronary artery disease)     LAD 95% proximal stenosis, D1 50-60% stenosis, the circumflex 40% stenosis, RCA subtotal stenosis.  . Cardiomyopathy, ischemic     EF was 30-35% by echo but 45-50% by cath  . Mitral regurgitation     Secondary to papillary muscle rupture   Past Surgical History  Procedure Laterality Date  . Stomach ulcer repair    . Tee without cardioversion N/A 02/04/2013    Procedure: TRANSESOPHAGEAL ECHOCARDIOGRAM (TEE);  Surgeon: Vesta MixerPhilip J Nahser, MD;  [facility];  Service: Cardiovascular;  Laterality: N/A;  . Intraoperative transesophageal echocardiogram N/A 02/05/2013    Procedure: INTRAOPERATIVE TRANSESOPHAGEAL  ECHOCARDIOGRAM;  Surgeon: Loreli SlotSteven C Hendrickson, MD;  [facility];  Service: Open Heart Surgery;  Laterality: N/A;  . Endovein harvest of greater saphenous vein Right 02/05/2013    Procedure: ENDOVEIN HARVEST OF GREATER SAPHENOUS VEIN;  Surgeon: Loreli SlotSteven C Hendrickson, MD;  [facility];  Service: Open Heart Surgery;  Laterality: Right;  . Patent foramen ovale closure N/A 02/05/2013    Procedure: PATENT FORAMEN OVALE CLOSURE;  Surgeon: Loreli SlotSteven C Hendrickson, MD;  [facility];  Service: Open Heart Surgery;  Laterality: N/A;  . Mitral valve replacement (mvr)/coronary artery bypass grafting (cabg) N/A 02/05/2013    Procedure: MITRAL VALVE REPLACEMENT (MVR)/CORONARY ARTERY BYPASS GRAFTING (CABG);  Surgeon: Loreli SlotSteven C Hendrickson, MD;  [facility];  Service: Open Heart Surgery;  Laterality: N/A;  x3 using right greater saphenous vein and left internal mammary.   . Coronary artery bypass graft      . ampicillin-sulbactam (UNASYN) IV  3 g Intravenous Q6H  . antiseptic oral rinse  15 mL Mouth Rinse QID  . aspirin  81 mg Per Tube Daily  . atorvastatin  80 mg Per Tube q1800  . bisoprolol  2.5 mg Oral Daily  . chlorhexidine  15 mL Mouth Rinse BID  . furosemide  40 mg Intravenous BID  . heparin  5,000 Units Subcutaneous 3 times per day  . hydrALAZINE  25 mg Oral 3 times per day  . insulin aspart  0-9 Units Subcutaneous 6 times per day  . isosorbide mononitrate  30 mg  Oral Daily  . pantoprazole (PROTONIX) IV  40 mg Intravenous Q24H  . potassium chloride  40 mEq Oral Once   . sodium chloride 10 mL/hr at 11/03/13 1126  . amiodarone (NEXTERONE PREMIX) 360 mg/200 mL dextrose 30 mg/hr (11/05/13 1056)    No Known Allergies  History   Social History  . Marital Status: Married    Spouse Name: N/A    Number of Children: N/A  . Years of Education: N/A   Occupational History  . Not on file.   Social History Main Topics  . Smoking status: Never Smoker   . Smokeless tobacco: Not on file      Comment: 40 yrs ago  . Alcohol Use: Yes     Comment: week end - 1/5 of crown  . Drug Use: No  . Sexual Activity: Not on file   Other Topics Concern  . Not on file   Social History Narrative  . No narrative on file    Family History  Problem Relation Age of Onset  . Heart attack Mother 59    ROS- pt is confused and unable to provide  Physical Exam: Telemetry:  Sinus rhythm with occasional PVCs Filed Vitals:   11/05/13 1100 11/05/13 1104 11/05/13 1200 11/05/13 1305  BP:  154/65    Pulse: 66  63 64  Temp:   98.5 F (36.9 C)   TempSrc:   Oral   Resp: 22  18 18   Height:      Weight:      SpO2: 95%  98% 98%    GEN- The patient is ill appearing, lethargic post extubation, confused and unable to provide history Head- normocephalic, atraumatic Eyes-  Sclera clear, conjunctiva pink Ears- hearing intact Oropharynx- clear Neck- supple  Lungs- Clear to ausculation bilaterally, normal work of breathing Heart- Regular rate and rhythm  GI- soft, NT, ND, + BS Extremities- no clubbing, cyanosis, + dependant edema MS- no significant deformity or atrophy Skin- no rash or lesion Psych- confused Neuro- moves all extremities  EKG 11/03/13- sinus rhythm 80 bpm, PR 326 msec, IVCD with QRS , inferior infarction Echo  11/02/13 is reviewed, EF 15%, bioprosthetic mitral valve is without stenosis or regurg, diffuse hypokinesis with basal inferior aneurysm, moderate LA dilatation,  Cath note from Dr Swaziland is reviewed Epic records are reviewed including ER admit note, Dr Tawana Scale consult note, Dr Chestine Spore notes, Dr Mclean/Nelsons notes, etc.  Labs:   Lab Results  Component Value Date   WBC 11.6* 11/05/2013   HGB 11.8* 11/05/2013   HCT 34.5* 11/05/2013   MCV 91.0 11/05/2013   PLT 108* 11/05/2013    Recent Labs Lab 11/05/13 0425  NA 146  K 3.9  CL 111  CO2 23  BUN 35*  CREATININE 1.70*  CALCIUM 8.5  PROT 6.0  BILITOT 1.0  ALKPHOS 71  ALT 44  AST 31  GLUCOSE 112*    Lab Results  Component Value Date   CKTOTAL 111 01/31/2013   CKMB 3.7 01/31/2013   TROPONINI 1.17* 01/31/2013    Lab Results  Component Value Date   CHOL 81 02/17/2013   CHOL 149 02/01/2013   Lab Results  Component Value Date   HDL 37* 02/17/2013   HDL 31* 02/01/2013   Lab Results  Component Value Date   LDLCALC 28 02/17/2013   LDLCALC 95 02/01/2013   Lab Results  Component Value Date   TRIG 76 11/01/2013   TRIG 82 02/17/2013   TRIG 114 02/01/2013  Lab Results  Component Value Date   CHOLHDL 2.2 02/17/2013   CHOLHDL 4.8 02/01/2013   No results found for this basename: LDLDIRECT    ASSESSMENT AND PLAN:   The patient has an ischemic CM (EF most recently 15%), NYHA Class III CHF, and CAD.  He has been admitted with VT/VF arrest for which he required defibrillation in the field.  He has been extubated today and appears to be making meaningful recovery.  He is still quite ill and confused and has a ways to go with recovery. I do think that he would be a good candidate for ICD implantation for secondary treatment of VT/VF.  He will not be ready for this for several days (probably mid next week). In the interim, it would be reasonable to stop amiodarone unless he develops recurrent ventricular arrhythmias.  I would like to have all central lines/ foley/ etc removed for several days prior to ICD implantation to reduce our infectious risks. I suspect that he will have significant improvements in his anoxic encephalopathy.  If he does not, then he may require a lifevest, though I would prefer ICD implant prior to discharge if his recovery allows.  EP to follow along with general cardiology. Please call with questions.  Hillis Range, MD 11/05/2013  1:54 PM

## 2013-11-06 ENCOUNTER — Inpatient Hospital Stay (HOSPITAL_COMMUNITY): Payer: Managed Care, Other (non HMO)

## 2013-11-06 LAB — BASIC METABOLIC PANEL
BUN: 32 mg/dL — ABNORMAL HIGH (ref 6–23)
BUN: 33 mg/dL — AB (ref 6–23)
CHLORIDE: 104 meq/L (ref 96–112)
CO2: 26 mEq/L (ref 19–32)
CO2: 27 mEq/L (ref 19–32)
Calcium: 9 mg/dL (ref 8.4–10.5)
Calcium: 9.4 mg/dL (ref 8.4–10.5)
Chloride: 108 mEq/L (ref 96–112)
Creatinine, Ser: 1.76 mg/dL — ABNORMAL HIGH (ref 0.50–1.35)
Creatinine, Ser: 1.72 mg/dL — ABNORMAL HIGH (ref 0.50–1.35)
GFR calc non Af Amer: 39 mL/min — ABNORMAL LOW (ref >90)
GFR, EST AFRICAN AMERICAN: 44 mL/min — AB (ref >90)
GFR, EST AFRICAN AMERICAN: 45 mL/min — AB (ref >90)
GFR, EST NON AFRICAN AMERICAN: 38 mL/min — AB (ref >90)
Glucose, Bld: 107 mg/dL — ABNORMAL HIGH (ref 70–99)
Glucose, Bld: 135 mg/dL — ABNORMAL HIGH (ref 70–99)
POTASSIUM: 3.3 meq/L — AB (ref 3.7–5.3)
POTASSIUM: 3.4 meq/L — AB (ref 3.7–5.3)
SODIUM: 147 meq/L (ref 137–147)
Sodium: 148 mEq/L — ABNORMAL HIGH (ref 137–147)

## 2013-11-06 LAB — CBC
HCT: 36.6 % — ABNORMAL LOW (ref 39.0–52.0)
HEMOGLOBIN: 12.2 g/dL — AB (ref 13.0–17.0)
MCH: 30.4 pg (ref 26.0–34.0)
MCHC: 33.3 g/dL (ref 30.0–36.0)
MCV: 91.3 fL (ref 78.0–100.0)
Platelets: 137 10*3/uL — ABNORMAL LOW (ref 150–400)
RBC: 4.01 MIL/uL — ABNORMAL LOW (ref 4.22–5.81)
RDW: 14.7 % (ref 11.5–15.5)
WBC: 13.7 10*3/uL — ABNORMAL HIGH (ref 4.0–10.5)

## 2013-11-06 LAB — CARBOXYHEMOGLOBIN
CARBOXYHEMOGLOBIN: 1.4 % (ref 0.5–1.5)
Methemoglobin: 1.2 % (ref 0.0–1.5)
O2 SAT: 87.9 %
TOTAL HEMOGLOBIN: 12.1 g/dL — AB (ref 13.5–18.0)

## 2013-11-06 LAB — MAGNESIUM: MAGNESIUM: 1.9 mg/dL (ref 1.5–2.5)

## 2013-11-06 LAB — GLUCOSE, CAPILLARY
GLUCOSE-CAPILLARY: 106 mg/dL — AB (ref 70–99)
Glucose-Capillary: 104 mg/dL — ABNORMAL HIGH (ref 70–99)
Glucose-Capillary: 109 mg/dL — ABNORMAL HIGH (ref 70–99)
Glucose-Capillary: 92 mg/dL (ref 70–99)

## 2013-11-06 MED ORDER — BISOPROLOL FUMARATE 5 MG PO TABS
5.0000 mg | ORAL_TABLET | Freq: Two times a day (BID) | ORAL | Status: DC
  Administered 2013-11-06 (×2): 5 mg via ORAL
  Filled 2013-11-06 (×5): qty 1

## 2013-11-06 MED ORDER — AMIODARONE HCL IN DEXTROSE 360-4.14 MG/200ML-% IV SOLN
60.0000 mg/h | INTRAVENOUS | Status: AC
  Administered 2013-11-06: 60 mg/h via INTRAVENOUS

## 2013-11-06 MED ORDER — POTASSIUM CHLORIDE 10 MEQ/50ML IV SOLN
10.0000 meq | INTRAVENOUS | Status: AC
  Administered 2013-11-06 – 2013-11-07 (×4): 10 meq via INTRAVENOUS
  Filled 2013-11-06 (×4): qty 50

## 2013-11-06 MED ORDER — AMIODARONE HCL IN DEXTROSE 360-4.14 MG/200ML-% IV SOLN
30.0000 mg/h | INTRAVENOUS | Status: DC
  Administered 2013-11-07 – 2013-11-09 (×5): 30 mg/h via INTRAVENOUS
  Filled 2013-11-06 (×11): qty 200

## 2013-11-06 MED ORDER — AMIODARONE LOAD VIA INFUSION
150.0000 mg | Freq: Once | INTRAVENOUS | Status: DC
  Filled 2013-11-06: qty 83.34

## 2013-11-06 MED ORDER — MIDAZOLAM HCL 2 MG/2ML IJ SOLN
INTRAMUSCULAR | Status: AC
  Administered 2013-11-06: 1 mg
  Filled 2013-11-06: qty 2

## 2013-11-06 MED ORDER — POTASSIUM CHLORIDE CRYS ER 20 MEQ PO TBCR
20.0000 meq | EXTENDED_RELEASE_TABLET | ORAL | Status: AC
  Administered 2013-11-06: 20 meq via ORAL
  Filled 2013-11-06 (×2): qty 1

## 2013-11-06 MED ORDER — MAGNESIUM SULFATE 40 MG/ML IJ SOLN
2.0000 g | Freq: Once | INTRAMUSCULAR | Status: AC
  Administered 2013-11-06: 2 g via INTRAVENOUS
  Filled 2013-11-06: qty 50

## 2013-11-06 MED ORDER — MIDAZOLAM HCL 2 MG/2ML IJ SOLN: 1.0000 mg | Freq: Once | INTRAMUSCULAR | Status: AC

## 2013-11-06 MED ORDER — AMIODARONE HCL IN DEXTROSE 360-4.14 MG/200ML-% IV SOLN
INTRAVENOUS | Status: AC
  Administered 2013-11-06: 23:00:00
  Filled 2013-11-06: qty 200

## 2013-11-06 MED ORDER — PANTOPRAZOLE SODIUM 40 MG PO TBEC
40.0000 mg | DELAYED_RELEASE_TABLET | Freq: Every day | ORAL | Status: DC
  Administered 2013-11-06 – 2013-11-13 (×8): 40 mg via ORAL
  Filled 2013-11-06 (×7): qty 1

## 2013-11-06 MED ORDER — AMIODARONE HCL IN DEXTROSE 360-4.14 MG/200ML-% IV SOLN
INTRAVENOUS | Status: AC
  Administered 2013-11-06: 60 mg/h
  Filled 2013-11-06: qty 200

## 2013-11-06 MED ORDER — POTASSIUM CHLORIDE CRYS ER 20 MEQ PO TBCR
40.0000 meq | EXTENDED_RELEASE_TABLET | Freq: Once | ORAL | Status: AC
  Administered 2013-11-06: 20 meq via ORAL

## 2013-11-06 MED ORDER — METOPROLOL TARTRATE 1 MG/ML IV SOLN
2.5000 mg | Freq: Once | INTRAVENOUS | Status: AC
  Administered 2013-11-06: 2.5 mg via INTRAVENOUS

## 2013-11-06 MED ORDER — HYDRALAZINE HCL 50 MG PO TABS
50.0000 mg | ORAL_TABLET | Freq: Three times a day (TID) | ORAL | Status: DC
  Administered 2013-11-06 – 2013-11-10 (×11): 50 mg via ORAL
  Filled 2013-11-06 (×20): qty 1

## 2013-11-06 MED ORDER — METOPROLOL TARTRATE 1 MG/ML IV SOLN
INTRAVENOUS | Status: AC
  Filled 2013-11-06: qty 5

## 2013-11-06 NOTE — Progress Notes (Signed)
Rehab Admissions Coordinator Note:  Patient was screened by Adreanna Fickel L for appropriateness for an Inpatient Acute Rehab Consult.  At this time, we are recommending Inpatient Rehab consult. Noted that OT consult has been placed as well.   Naif Alabi, PT 11/06/2013, 3:20 PM  I can be reached at [phone].

## 2013-11-06 NOTE — Progress Notes (Signed)
Patient ID: Lonnie BerkshireRobert L Hotz, male   DOB: 1946/05/07, 68 y.o.   MRN: [id number]   SUBJECTIVE: Extubated yesterday.  Awake this morning, slowed but oriented to place and following commands.  Denies dyspnea or chest pain.  CVP 10.   Scheduled Meds: . antiseptic oral rinse  15 mL Mouth Rinse QID  . aspirin  81 mg Per Tube Daily  . atorvastatin  80 mg Per Tube q1800  . bisoprolol  5 mg Oral BID  . chlorhexidine  15 mL Mouth Rinse BID  . furosemide  40 mg Intravenous BID  . heparin  5,000 Units Subcutaneous 3 times per day  . hydrALAZINE  50 mg Oral 3 times per day  . insulin aspart  0-9 Units Subcutaneous 6 times per day  . isosorbide mononitrate  30 mg Oral Daily  . pantoprazole  40 mg Oral Daily  . potassium chloride  20 mEq Oral Q4H  . potassium chloride  40 mEq Oral Once   Continuous Infusions: . sodium chloride 10 mL/hr at 11/03/13 1126   PRN Meds:.sodium chloride, fentaNYL, hydrALAZINE    Filed Vitals:   11/06/13 0400 11/06/13 0500 11/06/13 0600 11/06/13 0700  BP:      Pulse: 81 81 80 81  Temp: 98.4 F (36.9 C)     TempSrc: Oral     Resp: 21 23 24 25   Height:      Weight:      SpO2: 98% 93% 98% 95%    Intake/Output Summary (Last 24 hours) at 11/06/13 0756 Last data filed at 11/06/13 0700  Gross per 24 hour  Intake 1150.8 ml  Output   5855 ml  Net -4704.2 ml    LABS: Basic Metabolic Panel:  Recent Labs  84/13/2402/25/15 0350 11/05/13 0425 11/06/13 0405  NA 142 146 148*  K 4.2 3.9 3.3*  CL 106 111 108  CO2 18* 23 26  GLUCOSE 158* 112* 107*  BUN 38* 35* 32*  CREATININE 1.96* 1.70* 1.76*  CALCIUM 8.1* 8.5 9.0  MG 2.2  --   --    Liver Function Tests:  Recent Labs  11/04/13 0350 11/05/13 0425  AST 39* 31  ALT 53 44  ALKPHOS 81 71  BILITOT 0.9 1.0  PROT 6.0 6.0  ALBUMIN 2.6* 2.7*   No results found for this basename: LIPASE, AMYLASE,  in the last 72 hours CBC:  Recent Labs  11/05/13 0425 11/06/13 0405  WBC 11.6* 13.7*  NEUTROABS 9.5*  --   HGB  11.8* 12.2*  HCT 34.5* 36.6*  MCV 91.0 91.3  PLT 108* 137*   Cardiac Enzymes: No results found for this basename: CKTOTAL, CKMB, CKMBINDEX, TROPONINI,  in the last 72 hours BNP: No components found with this basename: POCBNP,  D-Dimer: No results found for this basename: DDIMER,  in the last 72 hours Hemoglobin A1C: No results found for this basename: HGBA1C,  in the last 72 hours Fasting Lipid Panel: No results found for this basename: CHOL, HDL, LDLCALC, TRIG, CHOLHDL, LDLDIRECT,  in the last 72 hours Thyroid Function Tests: No results found for this basename: TSH, T4TOTAL, FREET3, T3FREE, THYROIDAB,  in the last 72 hours Anemia Panel: No results found for this basename: VITAMINB12, FOLATE, FERRITIN, TIBC, IRON, RETICCTPCT,  in the last 72 hours  RADIOLOGY: Ct Head Wo Contrast  11/01/2013   CLINICAL DATA:  68 year old male with altered mental status.  EXAM: CT HEAD WITHOUT CONTRAST  TECHNIQUE: Contiguous axial images were obtained from the base of the  skull through the vertex without intravenous contrast.  COMPARISON:  03/14/2011  FINDINGS: A left occipital infarct appears subacute to remote.  No acute intracranial abnormalities are identified, including mass lesion or mass effect, hydrocephalus, extra-axial fluid collection, midline shift, hemorrhage, or acute infarction. The visualized bony calvarium is unremarkable.  IMPRESSION: No evidence of acute intracranial abnormality.  Left occipital infarct which appears subacute-remote.   Electronically Signed   By: Laveda Abbe M.D.   On: 11/01/2013 18:26   Dg Chest Port 1 View  11/06/2013   CLINICAL DATA:  Bilateral infiltrates, left lower lobe atelectasis.  EXAM: PORTABLE CHEST - 1 VIEW  COMPARISON:  DG CHEST 1V PORT dated 11/05/2013; DG CHEST 1V PORT dated 11/01/2013; DG CHEST 2 VIEW dated 03/17/2013; DG CHEST 1V PORT dated 02/12/2013  FINDINGS: Grossly unchanged enlarged cardiac silhouette and mediastinal contours post median sternotomy, CABG and  aortic valve replacement. Interval extubation and removal of enteric tube. Otherwise, stable positioning of left jugular approach central venous catheter tip projects over the superior SVC. No pneumothorax. Minimally improved aeration along. Persistent heterogeneous opacities about the left hilum. Minimally improved aeration left lower lung with persistent retrocardiac opacities. No new focal airspace opacities. No definite pleural effusion. Surgical clips overlie the caudal aspect of the right thoracic inlet as well as expected location of the gastroesophageal junction. Grossly unchanged bones.  IMPRESSION: 1. Interval extubation and removal of enteric tube. Otherwise, stable position of remaining support apparatus. No pneumothorax. 2. Overall improved aeration along suggests resolving edema and atelectasis. 3. Similar-appearing left perihilar and medial basilar opacities, with differential considerations including residual asymmetric pulmonary edema, atelectasis versus infiltrate. Continued attention on follow-up is recommended.   Electronically Signed   By: Simonne Come M.D.   On: 11/06/2013 07:32   Dg Chest Port 1 View  11/05/2013   CLINICAL DATA:  Evaluate endotracheal tube. Evaluate pulmonary edema.  EXAM: PORTABLE CHEST - 1 VIEW  COMPARISON:  DG CHEST 1V PORT dated 11/04/2013  FINDINGS: Endotracheal tube is appropriately positioned, terminating 3.7 cm above carina. Mitral valve repair. Nasogastric tube is poorly visualized distally, but likely extends beyond the inferior aspect of the film. Left internal jugular line terminates at the mid SVC.  Cardiomegaly accentuated by AP portable technique. Right hemidiaphragm elevation. No pleural effusion or pneumothorax. Inferior right upper lobe airspace disease is unchanged. Worsened left base aeration with developing consolidation. Mild interstitial edema is similar.  IMPRESSION: Worsened left base aeration, suspicious for atelectasis or infection.  Mild  interstitial edema with similar right upper lobe airspace disease, favored to represent atelectasis.   Electronically Signed   By: Jeronimo Greaves M.D.   On: 11/05/2013 07:40   Dg Chest Port 1 View  11/04/2013   CLINICAL DATA:  Ventilator dependence.  EXAM: PORTABLE CHEST - 1 VIEW  COMPARISON:  Multiple recent previous exams.  FINDINGS: 0719 hrs. Endotracheal tube tip is 3.1 cm above the base of the carina. The left IJ central venous catheter tip overlies the expected location of the innominate vein confluence. The tip may be directed against the lateral wall of the vessel. The NG tube passes into the stomach although the distal tip position is not included on the film.  The cardio pericardial silhouette is enlarged. The patient is status post CABG and cardiac valve replacement. Pulmonary vascular congestion persists. Right parahilar atelectasis noted.  IMPRESSION: No substantial interval change in exam. Cardiomegaly with vascular congestion and probable interstitial edema.  Right parahilar atelectasis.   Electronically Signed   By:  Kennith Center M.D.   On: 11/04/2013 07:36   Dg Chest Port 1 View  11/03/2013   CLINICAL DATA:  Follow-up pneumonia, shortness of breath.  EXAM: PORTABLE CHEST - 1 VIEW  COMPARISON:  Chest radiograph November 01, 1998 and the  FINDINGS: Endotracheal tube tip projects 3.1 cm above the carina. Nasogastric tube at the gastroesophageal junction the distal tip is not imaged. Catheter projects over the lower chest. Left internal jugular central venous catheter with distal tip projecting in proximal superior vena cava. Pacer pad over the heart.  The cardiac silhouette appears at least mildly enlarged, status post sternotomy for cardiac valve replacement. Similar central pulmonary vasculature fullness and interstitial prominence, no definite pleural effusions the the left costophrenic angle is incompletely imaged. No pneumothorax.  Multiple EKG lines overlie the patient and may obscure subtle  underlying pathology.  IMPRESSION: No apparent change in position of life-support lines.  Stable cardiomegaly and mild to moderate pulmonary edema.   Electronically Signed   By: Awilda Metro   On: 11/03/2013 05:29   Dg Chest Port 1 View  11/02/2013   CLINICAL DATA:  Central line placement.  EXAM: PORTABLE CHEST - 1 VIEW  COMPARISON:  11/01/2013  FINDINGS: A left IJ central venous catheter is noted with tip overlying the mid/azygos region.  Cardiomegaly pulmonary vascular congestion noted is low volume film.  Evidence of previous CABG and cardiac valve replacement noted.  An endotracheal tube is identified with tip 4.2 cm above the carina.  An NG tube is identified extending off the field of view.  There is no evidence of pneumothorax.  IMPRESSION: Left central venous catheter placement with tip overlying the mid-upper SVC/azygos region. No evidence of pneumothorax.  Other support apparatus as described.  No other significant change.   Electronically Signed   By: Laveda Abbe M.D.   On: 11/02/2013 00:59   Dg Chest Portable 1 View  11/01/2013   CLINICAL DATA:  ET tube placement  EXAM: PORTABLE CHEST - 1 VIEW  COMPARISON:  03/17/2013  FINDINGS: The endotracheal tube tip is in satisfactory position above the carina. The patient is status post median sternotomy and CABG procedure. Heart size appears enlarged. There may be mild edema within the left lung.  IMPRESSION: Endotracheal tube tip is above the carina.  Suspect a left lung edema.   Electronically Signed   By: Signa Kell M.D.   On: 11/01/2013 17:56    PHYSICAL EXAM General: NAD Neck: JVP 10-12 cm, no thyromegaly or thyroid nodule.  Lungs: Crackles at bases bilaterally.  CV: Nondisplaced PMI.  Heart regular S1/S2, +S3, no murmur.  Trace ankle edema.  No carotid bruit.  Abdomen: Soft, nontender, no hepatosplenomegaly, no distention.  Neurologic: Alert and oriented x 3.  Psych: Normal affect. Extremities: No clubbing or cyanosis.   TELEMETRY:  Reviewed telemetry pt in NSR  ASSESSMENT AND PLAN: 68 yo with history of CAD s/p CABG, ischemic CMP, CKD, and bioprosthetic MVR now s/p cardiac arrest. 1. Cardiac arrest: Suspect ventricular fibrillation, scar-related. Status post hypothermia, now extubated.  This morning he is a bit slowed but is oriented to place and follows commands. He will need ICD => EP has seen (not CRT candidate with narrow QRS). He is currently on amiodarone. No further arrhythmias.  Will stop this and increase bisoprolol to 5 mg bid.   2. CAD: s/p CABG. Grafts patent on LHC this admission, suspect cardiac arrest was scar-related rather than ischemia related. On ASA 81 and statin.  3. Acute on chronic systolic CHF: EF 40% with RV dysfunction. Good co-ox. CVP 10 this morning with JVD on exam. BP stable. Creatinine elevated but stable.  He diuresed well yesterday.  - At this point, do not think he needs inotrope. Would continue to diurese with Lasix 40 mg IV bid today and follow UOP and creatinine.  - Increase hydralazine/nitrates for afterload reduction (hold off on ACEI with AKI).  - As above, bisoprolol to 5 mg bid with discontinuation of amiodarone.   4. AKI on CKD: Creatinine stable today. Suspect initial rise was hemodynamically-mediated, related to cardiac arrest/hypotension.  5. PT/OT.   Marca Ancona 11/06/2013 8:02 AM

## 2013-11-06 NOTE — Progress Notes (Signed)
Agree with interventions and documentation as specified by dietetic intern.  Alvilda Mckenna, MS RD LDN Clinical Inpatient Dietitian Pager: [phone] Weekend/After hours pager: [phone] 

## 2013-11-06 NOTE — Progress Notes (Signed)
NUTRITION FOLLOW UP  Intervention:   Diet recommendations per SLP.  Nutrition Dx:   Inadequate oral intake related to inability to eat as evidenced by NPO; Ongoing  Goal:   Pt to meet >/=90% of their estimated nutrition needs.  Monitor:   Weight trends, labs, I/O's, diet recommendations per SLP.  Assessment:   68 yr old M with recent history mitral valve surgery and cabg. Pt with PMH of high cholesterol, HTN, CAD, cardiomyopathy, and mitral regurgitation. Pt with cardiac arrest at home and was found to be sitting in a chair unresponsive by his wife in ventricular fibrillation. Pt completed hypthermia protocol 2/24.  2/25-  Pt vomited green bile, tube feeding stopped. OG tube placed to low wall suction.  2/27-Pt extubated 2/26, currently on nasal cannula. Pt is currently NPO. RD to monitor SLP swallow evaluation. Once diet advanced per team and SLP recommendations, will monitor pt PO intake. If PO intake poor, will recommend oral supplement.  Labs: Low potassium (potassium being repleted with potassium chloride) Hight Sodium, BUN, glucose, and creatinine  Height: Ht Readings from Last 1 Encounters:  11/01/13 6\' 1"  (1.854 m)    Weight Status:   Wt Readings from Last 1 Encounters:  11/06/13 218 lb 11.1 oz (99.2 kg)  11/05/13 237 lb 11/04/13 241 lb 11/03/13 234 lb Admit wt 2/22 235 lb  Body mass index is 28.86 kg/(m^2).  Re-estimated needs:  Kcal: 2000-2200 Protein: 120-130 grams Fluid: 2 L-2.2 L/day  Skin: +1 generalized edema  Diet Order: NPO   Intake/Output Summary (Last 24 hours) at 11/06/13 0922 Last data filed at 11/06/13 0700  Gross per 24 hour  Intake  997.4 ml  Output   5630 ml  Net -4632.6 ml    Last BM: 2/25-loose small amount-rectal tube   Labs:   Recent Labs Lab 11/02/13 0803  11/04/13 0350 11/05/13 0425 11/06/13 0405  NA  --   < > 142 146 148*  K  --   < > 4.2 3.9 3.3*  CL  --   < > 106 111 108  CO2  --   < > 18* 23 26  BUN  --   < >  38* 35* 32*  CREATININE  --   < > 1.96* 1.70* 1.76*  CALCIUM  --   < > 8.1* 8.5 9.0  MG 1.7  --  2.2  --   --   GLUCOSE  --   < > 158* 112* 107*  < > = values in this interval not displayed.  CBG (last 3)   Recent Labs  11/05/13 2336 11/06/13 0357 11/06/13 0756  GLUCAP 93 104* 109*    Scheduled Meds: . antiseptic oral rinse  15 mL Mouth Rinse QID  . aspirin  81 mg Per Tube Daily  . atorvastatin  80 mg Per Tube q1800  . bisoprolol  5 mg Oral BID  . chlorhexidine  15 mL Mouth Rinse BID  . furosemide  40 mg Intravenous BID  . heparin  5,000 Units Subcutaneous 3 times per day  . hydrALAZINE  50 mg Oral 3 times per day  . insulin aspart  0-9 Units Subcutaneous 6 times per day  . isosorbide mononitrate  30 mg Oral Daily  . pantoprazole  40 mg Oral Daily  . [COMPLETED] potassium chloride  20 mEq Oral Q4H    Continuous Infusions: . sodium chloride 10 mL/hr at 11/03/13 1126    [facility]phanie Mayela Bullard Dietetic Intern Pager: [phone]

## 2013-11-06 NOTE — Evaluation (Signed)
Clinical/Bedside Swallow Evaluation Patient Details  Name: Lonnie [name] MRN: [id number] Date of Birth: [birth year]  Today's Date: 11/06/2013 Time: 7829-5621 SLP Time Calculation (min): 25 min  Past Medical History:  Past Medical History  Diagnosis Date  . High cholesterol   . Hypertension   . Constipation   . Reflux   . Complication of anesthesia     "Hard time waking me up" after sedation after dental procedure  . CAD (coronary artery disease)     LAD 95% proximal stenosis, D1 50-60% stenosis, the circumflex 40% stenosis, RCA subtotal stenosis.  . Cardiomyopathy, ischemic     EF was 30-35% by echo but 45-50% by cath  . Mitral regurgitation     Secondary to papillary muscle rupture   Past Surgical History:  Past Surgical History  Procedure Laterality Date  . Stomach ulcer repair    . Tee without cardioversion N/A 02/04/2013    Procedure: TRANSESOPHAGEAL ECHOCARDIOGRAM (TEE);  Surgeon: Vesta Mixer, MD;  Location: [facility] ENDOSCOPY;  Service: Cardiovascular;  Laterality: N/A;  . Intraoperative transesophageal echocardiogram N/A 02/05/2013    Procedure: INTRAOPERATIVE TRANSESOPHAGEAL ECHOCARDIOGRAM;  Surgeon: [name], MD;  Location: [facility] OR;  Service: Open Heart Surgery;  Laterality: N/A;  . Endovein harvest of greater saphenous vein Right 02/05/2013    Procedure: ENDOVEIN HARVEST OF GREATER SAPHENOUS VEIN;  Surgeon: [name], MD;  Location: [facility] OR;  Service: Open Heart Surgery;  Laterality: Right;  . Patent foramen ovale closure N/A 02/05/2013    Procedure: PATENT FORAMEN OVALE CLOSURE;  Surgeon: [name], MD;  Location: [facility] OR;  Service: Open Heart Surgery;  Laterality: N/A;  . Mitral valve replacement (mvr)/coronary artery bypass grafting (cabg) N/A 02/05/2013    Procedure: MITRAL VALVE REPLACEMENT (MVR)/CORONARY ARTERY BYPASS GRAFTING (CABG);  Surgeon: [name], MD;  Location: [facility] OR;  Service: Open Heart Surgery;  Laterality: N/A;  x3  using right greater saphenous vein and left internal mammary.   . Coronary artery bypass graft     HPI:  67 yr known cad and LVEF 30%, mitral valve prolapse from papillary muscle rupture from MI with MV replacement /bypass, found VF by wife. Intubated from 2/22 to 2/26.    Assessment / Plan / Recommendation Clinical Impression  Pt demonstrates adequate airway protection with thin liquids; no evidence of aspiration or concern for silent aspiration. Pt does struggle masticating solids, requireing cues to sustain attention to bolus and transit. Recommend Dys 2 (finely chopped), thin. Will f/u for tolerance and advancement. Wife in agreement.     Aspiration Risk  Mild    Diet Recommendation Dysphagia 2 (Fine chop);Thin liquid   Liquid Administration via: Cup;Straw Medication Administration: Whole meds with liquid Supervision: Staff to assist with self feeding Compensations: Slow rate;Small sips/bites Postural Changes and/or Swallow Maneuvers: Seated upright 90 degrees    Other  Recommendations Oral Care Recommendations: Oral care BID   Follow Up Recommendations  Other (comment);24 hour supervision/assistance (may need f/u for cognition)    Frequency and Duration min 2x/week  1 week   Pertinent Vitals/Pain NA    SLP Swallow Goals     Swallow Study Prior Functional Status  Type of Home: House Available Help at Discharge: Family;Available 24 hours/day    General HPI: 67 yr known cad and LVEF 30%, mitral valve prolapse from papillary muscle rupture from MI with MV replacement /bypass, found VF by wife. Intubated from 2/22 to 2/26.  Type of Study: Bedside swallow evaluation Previous Swallow Assessment:  none in chart Diet Prior to this Study: NPO Temperature Spikes Noted: No Respiratory Status: Nasal cannula Behavior/Cognition: Alert;Confused Oral Cavity - Dentition: Adequate natural dentition Self-Feeding Abilities: Able to feed self;Needs assist Patient Positioning: Upright  in chair Baseline Vocal Quality: Clear (family reports mildly hoarse, but adequate to me) Volitional Cough: Strong Volitional Swallow: Able to elicit    Oral/Motor/Sensory Function Overall Oral Motor/Sensory Function: Appears within functional limits for tasks assessed   Ice Chips Ice chips: Within functional limits   Thin Liquid Thin Liquid: Within functional limits Presentation: Cup;Straw    Nectar Thick Nectar Thick Liquid: Not tested   Honey Thick Honey Thick Liquid: Not tested   Puree Puree: Within functional limits   Solid   GO    Solid: Impaired Presentation: Self Fed Oral Phase Impairments: Reduced lingual movement/coordination;Impaired anterior to posterior transit;Impaired mastication Oral Phase Functional Implications: Oral residue       Shanielle Correll, Riley NearingBonnie Caroline 11/06/2013,1:12 PM

## 2013-11-06 NOTE — Progress Notes (Signed)
[place] ICU Electrolyte Replacement Protocol  Patient Name: Lonnie [name] DOB: August 03, 1946 MRN: [id number]  Date of Service  11/06/2013   HPI/Events of Note    Recent Labs Lab 11/02/13 0803  11/03/13 0615 11/03/13 1013 11/04/13 0350 11/05/13 0425 11/06/13 0405  NA  --   < > 141 142 142 146 148*  K  --   < > 3.9 4.6 4.2 3.9 3.3*  CL  --   < > 107 107 106 111 108  CO2  --   < > 17* 17* 18* 23 26  GLUCOSE  --   < > 121* 120* 158* 112* 107*  BUN  --   < > 27* 27* 38* 35* 32*  CREATININE  --   < > 1.34 1.41* 1.96* 1.70* 1.76*  CALCIUM  --   < > 8.0* 8.0* 8.1* 8.5 9.0  MG 1.7  --   --   --  2.2  --   --   < > = values in this interval not displayed.  Estimated Creatinine Clearance: 49.8 ml/min (by C-G formula based on Cr of 1.76).  Intake/Output     02/26 0701 - 02/27 0700   I.V. (mL/kg) 677.4 (6.8)   NG/GT 100   IV Piggyback 300   Total Intake(mL/kg) 1077.4 (10.9)   Urine (mL/kg/hr) 5725 (2.4)   Stool 30 (0)   Total Output 5755   Net -4677.6        - I/O DETAILED x24h    Total I/O In: 457 [I.V.:357; IV Piggyback:100] Out: 2830 [Urine:2800; Stool:30] - I/O THIS SHIFT    ASSESSMENT   eICURN Interventions  K+ 3.3 ICU Electrolyte Replacement Protocol criteria met.Labs Replaced per protocol.MD notified    ASSESSMENT: MAJOR ELECTROLYTE    Lorene Dy 11/06/2013, 5:22 AM

## 2013-11-06 NOTE — Evaluation (Signed)
Physical Therapy Evaluation Patient Details Name: Lonnie BerkshireRobert L [name] MRN: [id number] DOB: 12-02-1945 Today's Date: 11/06/2013 Time: [id number] PT Time Calculation (min): 14 min  PT Assessment / Plan / Recommendation History of Present Illness  Pt admit with cardiac arrest.  Anoxic encephalopathy and VDRF.    Clinical Impression  Pt admitted with above. Pt currently with functional limitations due to the deficits listed below (see PT Problem List).  Pt will benefit from skilled PT to increase their independence and safety with mobility to allow discharge to the venue listed below.     PT Assessment  Patient needs continued PT services    Follow Up Recommendations  CIR;Supervision/Assistance - 24 hour    Does the patient have the potential to tolerate intense rehabilitation      Barriers to Discharge        Equipment Recommendations  None recommended by PT    Recommendations for Other Services Rehab consult   Frequency Min 3X/week    Precautions / Restrictions Precautions Precautions: Fall Restrictions Weight Bearing Restrictions: No   Pertinent Vitals/Pain VSS, no pain      Mobility  Bed Mobility Overal bed mobility: Needs Assistance;+2 for physical assistance Bed Mobility: Supine to Sit Supine to sit: Max assist;+2 for physical assistance General bed mobility comments: Pt needed verbal cues to start moving his LEs to side of bed.  Pt also needed assist for elevation of trunk.  Pt had difficulty trying to sit up and leaning to left.   Transfers Overall transfer level: Needs assistance Equipment used: Rolling walker (2 wheeled) Transfers: Sit to/from UGI CorporationStand;Stand Pivot Transfers Sit to Stand: Mod assist;+2 physical assistance;From elevated surface Stand pivot transfers: Min assist;+2 physical assistance General transfer comment: Pt needed cues for hand placement and sequencing.  Pt needing steadying assist once to standing position.    Once up pt able to take pivotal  steps to chair with asssit for weight shifting.      Exercises General Exercises - Lower Extremity Ankle Circles/Pumps: AROM;Both;5 reps;Seated Long Arc Quad: AROM;Both;10 reps;Seated Hip Flexion/Marching: AROM;Both;10 reps;Seated   PT Diagnosis: Generalized weakness  PT Problem List: Decreased activity tolerance;Decreased balance;Decreased mobility;Decreased knowledge of use of DME;Decreased safety awareness;Decreased strength;Decreased knowledge of precautions PT Treatment Interventions: DME instruction;Gait training;Functional mobility training;Therapeutic activities;Therapeutic exercise;Balance training;Patient/family education     PT Goals(Current goals can be found in the care plan section) Acute Rehab PT Goals Patient Stated Goal: to get better PT Goal Formulation: With patient Time For Goal Achievement: 11/13/13 Potential to Achieve Goals: Good  Visit Information  Last PT Received On: 11/06/13 Assistance Needed: +2 History of Present Illness: Pt admit with cardiac arrest.  Anoxic encephalopathy and VDRF.         Prior Functioning  Home Living Family/patient expects to be discharged to:: Private residence Living Arrangements: Spouse/significant other Available Help at Discharge: Family;Available 24 hours/day Type of Home: House Home Access: Stairs to enter Entergy CorporationEntrance Stairs-Number of Steps: 2 Entrance Stairs-Rails: None Home Layout: Two level;Able to live on main level with bedroom/bathroom;Full bath on main level Home Equipment: Walker - 2 wheels Prior Function Level of Independence: Independent Comments: Location managermachine operator Communication Communication: No difficulties    Cognition  Cognition Arousal/Alertness: Lethargic Behavior During Therapy: Flat affect Overall Cognitive Status: Impaired/Different from baseline Area of Impairment: Orientation;Attention;Following commands;Safety/judgement;Awareness;Problem solving Orientation Level: Disoriented to;Time Current  Attention Level: Focused Following Commands: Follows one step commands inconsistently;Follows one step commands with increased time Safety/Judgement: Decreased awareness of safety;Decreased awareness of deficits Awareness: Intellectual Problem Solving:  Slow processing;Decreased initiation;Difficulty sequencing;Requires verbal cues;Requires tactile cues    Extremity/Trunk Assessment Upper Extremity Assessment Upper Extremity Assessment: Defer to OT evaluation Lower Extremity Assessment Lower Extremity Assessment: RLE deficits/detail;LLE deficits/detail RLE Deficits / Details: grossly 2+/5 LLE Deficits / Details: grossly 2+/5   Balance Balance Overall balance assessment: Needs assistance;History of Falls Sitting-balance support: Bilateral upper extremity supported;Feet supported Sitting balance-Leahy Scale: Poor Sitting balance - Comments: Leans left needing steadying support throughout.   Postural control: Left lateral lean Standing balance support: Bilateral upper extremity supported;During functional activity Standing balance-Leahy Scale: Poor Standing balance comment: Pt able to stand with bill HHA with min assist for stability for static stance.    End of Session PT - End of Session Equipment Utilized During Treatment: Gait belt;Oxygen Activity Tolerance: Patient limited by fatigue Patient left: in chair;with call bell/phone within reach Nurse Communication: Mobility status  GP     INGOLD,Laiyla Slagel 11/06/2013, 1:18 PM [facility] Acute Rehabilitation [phone] (pager)

## 2013-11-06 NOTE — Progress Notes (Signed)
Name: Lonnie [name] MRN: [id number] DOB: Aug 05, 1946    ADMISSION DATE:  11/01/2013 CONSULTATION DATE:  11/01/13  REFERRING MD :  Dr Harriett RushP Jordon, EDP PRIMARY SERVICE: s/p arrest  CHIEF COMPLAINT:  Cardiac arrest  BRIEF PATIENT DESCRIPTION: 67 yr known cad and LVEF 30%, mitral valve prolapse from papillary muscle rupture from MI with MV replacement /bypass, found VF by wife.  SIGNIFICANT EVENTS / STUDIES:  2/22- VF arrest, cath lab 2/24- rewarming 2/25- followed commands  LINES / TUBES: 2/22 ETT>>> 2/26 2/22 left IJ>>> 2/22 a line>>> 2/27  CULTURES: 2/22 sputum>>> 2/22 BC>>> 2/23 cdiff>>>neg  ANTIBIOTICS: Unasyn 2/22>>> 2/26  SUBJECTIVE:  Extubated, comfortable  VITAL SIGNS: Temp:  [98.1 F (36.7 C)-98.6 F (37 C)] 98.4 F (36.9 C) (02/27 0400) Pulse Rate:  [61-89] 81 (02/27 0700) Resp:  [17-25] 25 (02/27 0700) BP: (137-180)/(56-70) 180/70 mmHg (02/26 1911) SpO2:  [93 %-100 %] 95 % (02/27 0700) Arterial Line BP: (155-211)/(52-87) 170/66 mmHg (02/27 0700) FiO2 (%):  [35 %] 35 % (02/26 1046) Weight:  [99.2 kg (218 lb 11.1 oz)] 99.2 kg (218 lb 11.1 oz) (02/27 0300) HEMODYNAMICS: CVP:  [10 mmHg-11 mmHg] 10 mmHg VENTILATOR SETTINGS: Vent Mode:  [-]  FiO2 (%):  [35 %] 35 % INTAKE / OUTPUT: Intake/Output     02/26 0701 - 02/27 0700 02/27 0701 - 02/28 0700   I.V. (mL/kg) 750.8 (7.6)    NG/GT 100    IV Piggyback 300    Total Intake(mL/kg) 1150.8 (11.6)    Urine (mL/kg/hr) 5825 (2.4)    Emesis/NG output     Stool 30 (0)    Total Output 5855     Net -4704.2            PHYSICAL EXAMINATION: General: elderly man, NAD Neuro:  perrl 3 mm, awake, slow to respond, oriented to self only HEENT:  OP clear Cardiovascular:  s1 s2 RRT distant, no longer bardy Lungs:  ronchi resolved, slight coarse Abdomen:  Obese, nt, nd, no r Musculoskeletal:  Edema plus 1 Skin:  No rash  LABS:  CBC  Recent Labs Lab 11/04/13 0350 11/05/13 0425 11/06/13 0405  WBC 11.1* 11.6*  13.7*  HGB 12.6* 11.8* 12.2*  HCT 36.8* 34.5* 36.6*  PLT 106* 108* 137*   Coag's  Recent Labs Lab 11/01/13 1737 11/02/13 0135  APTT 25 25  INR 1.14 1.10   BMET  Recent Labs Lab 11/04/13 0350 11/05/13 0425 11/06/13 0405  NA 142 146 148*  K 4.2 3.9 3.3*  CL 106 111 108  CO2 18* 23 26  BUN 38* 35* 32*  CREATININE 1.96* 1.70* 1.76*  GLUCOSE 158* 112* 107*   Electrolytes  Recent Labs Lab 11/02/13 0803  11/04/13 0350 11/05/13 0425 11/06/13 0405  CALCIUM  --   < > 8.1* 8.5 9.0  MG 1.7  --  2.2  --   --   < > = values in this interval not displayed. Sepsis Markers  Recent Labs Lab 11/01/13 2226 11/03/13 0615  LATICACIDVEN 3.4* 1.6   ABG  Recent Labs Lab 11/03/13 0430 11/04/13 0934 11/05/13 0629  PHART 7.397 7.296* 7.354  PCO2ART 28.8* 42.7 36.0  PO2ART 73.3* 114.0* 289.0*   Liver Enzymes  Recent Labs Lab 11/03/13 0615 11/04/13 0350 11/05/13 0425  AST 67* 39* 31  ALT 64* 53 44  ALKPHOS 86 81 71  BILITOT 1.0 0.9 1.0  ALBUMIN 2.8* 2.6* 2.7*   Cardiac Enzymes No results found for this basename: TROPONINI, PROBNP,  in  the last 168 hours Glucose  Recent Labs Lab 11/05/13 1558 11/05/13 2009 11/05/13 2012 11/05/13 2336 11/06/13 0357 11/06/13 0756  GLUCAP 92 98 88 93 104* 109*    Imaging Dg Chest Port 1 View  11/06/2013   CLINICAL DATA:  Bilateral infiltrates, left lower lobe atelectasis.  EXAM: PORTABLE CHEST - 1 VIEW  COMPARISON:  DG CHEST 1V PORT dated 11/05/2013; DG CHEST 1V PORT dated 11/01/2013; DG CHEST 2 VIEW dated 03/17/2013; DG CHEST 1V PORT dated 02/12/2013  FINDINGS: Grossly unchanged enlarged cardiac silhouette and mediastinal contours post median sternotomy, CABG and aortic valve replacement. Interval extubation and removal of enteric tube. Otherwise, stable positioning of left jugular approach central venous catheter tip projects over the superior SVC. No pneumothorax. Minimally improved aeration along. Persistent heterogeneous  opacities about the left hilum. Minimally improved aeration left lower lung with persistent retrocardiac opacities. No new focal airspace opacities. No definite pleural effusion. Surgical clips overlie the caudal aspect of the right thoracic inlet as well as expected location of the gastroesophageal junction. Grossly unchanged bones.  IMPRESSION: 1. Interval extubation and removal of enteric tube. Otherwise, stable position of remaining support apparatus. No pneumothorax. 2. Overall improved aeration along suggests resolving edema and atelectasis. 3. Similar-appearing left perihilar and medial basilar opacities, with differential considerations including residual asymmetric pulmonary edema, atelectasis versus infiltrate. Continued attention on follow-up is recommended.   Electronically Signed   By: Simonne Come M.D.   On: 11/06/2013 07:32   Dg Chest Port 1 View  11/05/2013   CLINICAL DATA:  Evaluate endotracheal tube. Evaluate pulmonary edema.  EXAM: PORTABLE CHEST - 1 VIEW  COMPARISON:  DG CHEST 1V PORT dated 11/04/2013  FINDINGS: Endotracheal tube is appropriately positioned, terminating 3.7 cm above carina. Mitral valve repair. Nasogastric tube is poorly visualized distally, but likely extends beyond the inferior aspect of the film. Left internal jugular line terminates at the mid SVC.  Cardiomegaly accentuated by AP portable technique. Right hemidiaphragm elevation. No pleural effusion or pneumothorax. Inferior right upper lobe airspace disease is unchanged. Worsened left base aeration with developing consolidation. Mild interstitial edema is similar.  IMPRESSION: Worsened left base aeration, suspicious for atelectasis or infection.  Mild interstitial edema with similar right upper lobe airspace disease, favored to represent atelectasis.   Electronically Signed   By: Jeronimo Greaves M.D.   On: 11/05/2013 07:40    CXR: some increased LLL atx  ASSESSMENT / PLAN:  PULMONARY A: Acute respiratory failure,  concern aspiration P:   Successfully extubated Push pulm hygiene   CARDIOVASCULAR A: VF arrest,  cad,  s/p MV replacement.  Cardiogenic shock, resolved P:  Per cards will need AICD (had refused before) B-blocker, ACE-I, lasix restarted lipitor  Amiodarone ionotropes to off on 2/27  RENAL A:  ATN, ARF > stabilizing      Metabolic acidosis +AG,  P:   Follow BMP Diuresis as S Cr will allow > lasix 40mg  bid  GASTROINTESTINAL A:  Abdominal distention, secondary to bagging, king airway, at risk bowel ischemia - resolved 2/24 P:   ppi Swallow eval per speech rx  HEMATOLOGIC A:  Leukocytosis, DVT prevention P:  Sub q heparin scd  Follow CBC   INFECTIOUS A:  Possible Aspiration Diarrhea, C diff negative, improved P:   Unasyn, x 5 days then dc > stopped 2/26  ENDOCRINE A:  At risk hyperglycemia P:   ssi  NEUROLOGIC A:  S/p Arrest, followed commands P:   WUA  Goal MAP 60  TODAY'S  SUMMARY:  VT arrest, cardiogenic shock and VDRF. Goal extubation 2/26  I have personally obtained a history, examined the patient, evaluated laboratory and imaging results, formulated the assessment and plan and placed orders.  Will ask Cardiology to take over his care as of 2/28. Please call if CCM can help in any way  Levy Pupa, MD, PhD 11/06/2013, 8:51 AM [facility] [phone] or if no answer [phone]

## 2013-11-07 LAB — GLUCOSE, CAPILLARY
GLUCOSE-CAPILLARY: 113 mg/dL — AB (ref 70–99)
GLUCOSE-CAPILLARY: 131 mg/dL — AB (ref 70–99)
Glucose-Capillary: 99 mg/dL (ref 70–99)
Glucose-Capillary: 119 mg/dL — ABNORMAL HIGH (ref 70–99)
Glucose-Capillary: 162 mg/dL — ABNORMAL HIGH (ref 70–99)
Glucose-Capillary: 91 mg/dL (ref 70–99)

## 2013-11-07 LAB — BASIC METABOLIC PANEL
BUN: 33 mg/dL — ABNORMAL HIGH (ref 6–23)
CALCIUM: 9.1 mg/dL (ref 8.4–10.5)
CHLORIDE: 106 meq/L (ref 96–112)
CO2: 28 mEq/L (ref 19–32)
Creatinine, Ser: 1.67 mg/dL — ABNORMAL HIGH (ref 0.50–1.35)
GFR calc non Af Amer: 40 mL/min — ABNORMAL LOW (ref >90)
GFR, EST AFRICAN AMERICAN: 47 mL/min — AB (ref >90)
Glucose, Bld: 104 mg/dL — ABNORMAL HIGH (ref 70–99)
Potassium: 3.7 mEq/L (ref 3.7–5.3)
SODIUM: 147 meq/L (ref 137–147)

## 2013-11-07 LAB — CBC
HCT: 37.7 % — ABNORMAL LOW (ref 39.0–52.0)
Hemoglobin: 12.8 g/dL — ABNORMAL LOW (ref 13.0–17.0)
MCH: 30.8 pg (ref 26.0–34.0)
MCHC: 34 g/dL (ref 30.0–36.0)
MCV: 90.6 fL (ref 78.0–100.0)
PLATELETS: 141 10*3/uL — AB (ref 150–400)
RBC: 4.16 MIL/uL — ABNORMAL LOW (ref 4.22–5.81)
RDW: 14.5 % (ref 11.5–15.5)
WBC: 8.6 10*3/uL (ref 4.0–10.5)

## 2013-11-07 MED ORDER — BISOPROLOL FUMARATE 10 MG PO TABS
10.0000 mg | ORAL_TABLET | Freq: Two times a day (BID) | ORAL | Status: DC
  Administered 2013-11-07 – 2013-11-09 (×5): 10 mg via ORAL
  Filled 2013-11-07 (×13): qty 1

## 2013-11-07 MED ORDER — SODIUM CHLORIDE 0.9 % IJ SOLN
10.0000 mL | INTRAMUSCULAR | Status: DC | PRN
  Administered 2013-11-07: 10 mL

## 2013-11-07 MED ORDER — POTASSIUM CHLORIDE 20 MEQ/15ML (10%) PO LIQD
40.0000 meq | Freq: Every day | ORAL | Status: DC
  Administered 2013-11-07 – 2013-11-09 (×3): 40 meq via ORAL
  Filled 2013-11-07 (×4): qty 30

## 2013-11-07 MED ORDER — MAGNESIUM SULFATE IN D5W 10-5 MG/ML-% IV SOLN
1.0000 g | Freq: Once | INTRAVENOUS | Status: AC
  Administered 2013-11-07: 1 g via INTRAVENOUS
  Filled 2013-11-07: qty 100

## 2013-11-07 MED ORDER — SPIRONOLACTONE 12.5 MG HALF TABLET
12.5000 mg | ORAL_TABLET | Freq: Every day | ORAL | Status: DC
  Administered 2013-11-07: 12.5 mg via ORAL
  Filled 2013-11-07 (×2): qty 1

## 2013-11-07 NOTE — Plan of Care (Signed)
Problem: Phase I Progression Outcomes Goal: Voiding-avoid urinary catheter unless indicated Outcome: Not Met (add Reason) Foley for hypothermia protocol

## 2013-11-07 NOTE — Progress Notes (Signed)
Brief Procedure Note  [name] went into VT ~ 160s earlier this evening. Initially he received 150 mg IV amiodarone and gtt initiated. I examined him shortly thereafter, he was warm, knew he was in a hospital and asymptomatic. However, he had volatile blood pressure. Repeat bolus amiodarone 150 mg IV. His HR slows to mid 150s, remaining in wide complex tachycardia most consistent with VT. Given borderline blood pressure ~ sbps 100s -> 90s and concern for gradual decompensation, urgent cardioversion performed. 1 mg versed, 25 mcg fentanyl given, respiratory and RN at bedside with me. [name] was appropriately sedated and 200J synchronized cardioversion performed successfully. I updated his family before and afterwards. Amiodarone gtt to run for 24 hours.   Leeann MustJacob Terrace Chiem, MD

## 2013-11-07 NOTE — Progress Notes (Signed)
Patient ID: Lonnie BerkshireRobert L Nijjar, male   DOB: Jan 02, 1946, 68 y.o.   MRN: [id number]    SUBJECTIVE: VT last night.  No CPR but required DCCV to NSR.  Now in NSR back on amiodarone gtt.  Asleep currently but wakes and follows commands.    Scheduled Meds: . amiodarone  150 mg Intravenous Once  . antiseptic oral rinse  15 mL Mouth Rinse QID  . aspirin  81 mg Per Tube Daily  . atorvastatin  80 mg Per Tube q1800  . bisoprolol  10 mg Oral BID  . chlorhexidine  15 mL Mouth Rinse BID  . furosemide  40 mg Intravenous BID  . heparin  5,000 Units Subcutaneous 3 times per day  . hydrALAZINE  50 mg Oral 3 times per day  . insulin aspart  0-9 Units Subcutaneous 6 times per day  . isosorbide mononitrate  30 mg Oral Daily  . magnesium sulfate 1 - 4 g bolus IVPB  1 g Intravenous Once  . pantoprazole  40 mg Oral Daily  . potassium chloride  40 mEq Oral Daily  . spironolactone  12.5 mg Oral Daily   Continuous Infusions: . sodium chloride 20 mL/hr at 11/06/13 0800  . amiodarone (NEXTERONE PREMIX) 360 mg/200 mL dextrose 30 mg/hr (11/07/13 0527)   PRN Meds:.sodium chloride, fentaNYL, hydrALAZINE    Filed Vitals:   11/07/13 0400 11/07/13 0500 11/07/13 0600 11/07/13 0700  BP:      Pulse: 60 57 59 60  Temp: 98.2 F (36.8 C)     TempSrc: Oral     Resp: 20 19 20 25   Height:      Weight:      SpO2: 98% 96% 98% 100%    Intake/Output Summary (Last 24 hours) at 11/07/13 0729 Last data filed at 11/07/13 0600  Gross per 24 hour  Intake 1236.6 ml  Output   4175 ml  Net -2938.4 ml    LABS: Basic Metabolic Panel:  Recent Labs  40/98/1102/27/15 2203 11/07/13 0430  NA 147 147  K 3.4* 3.7  CL 104 106  CO2 27 28  GLUCOSE 135* 104*  BUN 33* 33*  CREATININE 1.72* 1.67*  CALCIUM 9.4 9.1  MG 1.9  --    Liver Function Tests:  Recent Labs  11/05/13 0425  AST 31  ALT 44  ALKPHOS 71  BILITOT 1.0  PROT 6.0  ALBUMIN 2.7*   No results found for this basename: LIPASE, AMYLASE,  in the last 72  hours CBC:  Recent Labs  11/05/13 0425 11/06/13 0405 11/07/13 0430  WBC 11.6* 13.7* 8.6  NEUTROABS 9.5*  --   --   HGB 11.8* 12.2* 12.8*  HCT 34.5* 36.6* 37.7*  MCV 91.0 91.3 90.6  PLT 108* 137* 141*   Cardiac Enzymes: No results found for this basename: CKTOTAL, CKMB, CKMBINDEX, TROPONINI,  in the last 72 hours BNP: No components found with this basename: POCBNP,  D-Dimer: No results found for this basename: DDIMER,  in the last 72 hours Hemoglobin A1C: No results found for this basename: HGBA1C,  in the last 72 hours Fasting Lipid Panel: No results found for this basename: CHOL, HDL, LDLCALC, TRIG, CHOLHDL, LDLDIRECT,  in the last 72 hours Thyroid Function Tests: No results found for this basename: TSH, T4TOTAL, FREET3, T3FREE, THYROIDAB,  in the last 72 hours Anemia Panel: No results found for this basename: VITAMINB12, FOLATE, FERRITIN, TIBC, IRON, RETICCTPCT,  in the last 72 hours  RADIOLOGY: Ct Head Wo Contrast  11/01/2013   CLINICAL DATA:  68 year old male with altered mental status.  EXAM: CT HEAD WITHOUT CONTRAST  TECHNIQUE: Contiguous axial images were obtained from the base of the skull through the vertex without intravenous contrast.  COMPARISON:  03/14/2011  FINDINGS: A left occipital infarct appears subacute to remote.  No acute intracranial abnormalities are identified, including mass lesion or mass effect, hydrocephalus, extra-axial fluid collection, midline shift, hemorrhage, or acute infarction. The visualized bony calvarium is unremarkable.  IMPRESSION: No evidence of acute intracranial abnormality.  Left occipital infarct which appears subacute-remote.   Electronically Signed   By: Laveda Abbe M.D.   On: 11/01/2013 18:26   Dg Chest Port 1 View  11/06/2013   CLINICAL DATA:  Bilateral infiltrates, left lower lobe atelectasis.  EXAM: PORTABLE CHEST - 1 VIEW  COMPARISON:  DG CHEST 1V PORT dated 11/05/2013; DG CHEST 1V PORT dated 11/01/2013; DG CHEST 2 VIEW dated  03/17/2013; DG CHEST 1V PORT dated 02/12/2013  FINDINGS: Grossly unchanged enlarged cardiac silhouette and mediastinal contours post median sternotomy, CABG and aortic valve replacement. Interval extubation and removal of enteric tube. Otherwise, stable positioning of left jugular approach central venous catheter tip projects over the superior SVC. No pneumothorax. Minimally improved aeration along. Persistent heterogeneous opacities about the left hilum. Minimally improved aeration left lower lung with persistent retrocardiac opacities. No new focal airspace opacities. No definite pleural effusion. Surgical clips overlie the caudal aspect of the right thoracic inlet as well as expected location of the gastroesophageal junction. Grossly unchanged bones.  IMPRESSION: 1. Interval extubation and removal of enteric tube. Otherwise, stable position of remaining support apparatus. No pneumothorax. 2. Overall improved aeration along suggests resolving edema and atelectasis. 3. Similar-appearing left perihilar and medial basilar opacities, with differential considerations including residual asymmetric pulmonary edema, atelectasis versus infiltrate. Continued attention on follow-up is recommended.   Electronically Signed   By: Simonne Come M.D.   On: 11/06/2013 07:32   Dg Chest Port 1 View  11/05/2013   CLINICAL DATA:  Evaluate endotracheal tube. Evaluate pulmonary edema.  EXAM: PORTABLE CHEST - 1 VIEW  COMPARISON:  DG CHEST 1V PORT dated 11/04/2013  FINDINGS: Endotracheal tube is appropriately positioned, terminating 3.7 cm above carina. Mitral valve repair. Nasogastric tube is poorly visualized distally, but likely extends beyond the inferior aspect of the film. Left internal jugular line terminates at the mid SVC.  Cardiomegaly accentuated by AP portable technique. Right hemidiaphragm elevation. No pleural effusion or pneumothorax. Inferior right upper lobe airspace disease is unchanged. Worsened left base aeration with  developing consolidation. Mild interstitial edema is similar.  IMPRESSION: Worsened left base aeration, suspicious for atelectasis or infection.  Mild interstitial edema with similar right upper lobe airspace disease, favored to represent atelectasis.   Electronically Signed   By: Jeronimo Greaves M.D.   On: 11/05/2013 07:40   Dg Chest Port 1 View  11/04/2013   CLINICAL DATA:  Ventilator dependence.  EXAM: PORTABLE CHEST - 1 VIEW  COMPARISON:  Multiple recent previous exams.  FINDINGS: 0719 hrs. Endotracheal tube tip is 3.1 cm above the base of the carina. The left IJ central venous catheter tip overlies the expected location of the innominate vein confluence. The tip may be directed against the lateral wall of the vessel. The NG tube passes into the stomach although the distal tip position is not included on the film.  The cardio pericardial silhouette is enlarged. The patient is status post CABG and cardiac valve replacement. Pulmonary vascular congestion persists. Right  parahilar atelectasis noted.  IMPRESSION: No substantial interval change in exam. Cardiomegaly with vascular congestion and probable interstitial edema.  Right parahilar atelectasis.   Electronically Signed   By: Kennith Center M.D.   On: 11/04/2013 07:36   Dg Chest Port 1 View  11/03/2013   CLINICAL DATA:  Follow-up pneumonia, shortness of breath.  EXAM: PORTABLE CHEST - 1 VIEW  COMPARISON:  Chest radiograph November 01, 1998 and the  FINDINGS: Endotracheal tube tip projects 3.1 cm above the carina. Nasogastric tube at the gastroesophageal junction the distal tip is not imaged. Catheter projects over the lower chest. Left internal jugular central venous catheter with distal tip projecting in proximal superior vena cava. Pacer pad over the heart.  The cardiac silhouette appears at least mildly enlarged, status post sternotomy for cardiac valve replacement. Similar central pulmonary vasculature fullness and interstitial prominence, no definite  pleural effusions the the left costophrenic angle is incompletely imaged. No pneumothorax.  Multiple EKG lines overlie the patient and may obscure subtle underlying pathology.  IMPRESSION: No apparent change in position of life-support lines.  Stable cardiomegaly and mild to moderate pulmonary edema.   Electronically Signed   By: Awilda Metro   On: 11/03/2013 05:29   PHYSICAL EXAM General: NAD Neck: JVP 10-12 cm, no thyromegaly or thyroid nodule.  Lungs: Crackles at bases bilaterally.  CV: Nondisplaced PMI.  Heart regular S1/S2, +S3, no murmur.  Trace ankle edema.  No carotid bruit.  Abdomen: Soft, nontender, no hepatosplenomegaly, no distention.  Neurologic: Alert and oriented x 3.  Psych: Normal affect. Extremities: No clubbing or cyanosis.   TELEMETRY: Reviewed telemetry pt in NSR  ASSESSMENT AND PLAN: 68 yo with history of CAD s/p CABG, ischemic CMP, CKD, and bioprosthetic MVR now s/p cardiac arrest. 1. Cardiac arrest: Suspect ventricular fibrillation, scar-related. Status post hypothermia, now extubated.  He had recurrent VT last night requiring DCCV.  This morning he is a bit slowed but follows commands. He will need ICD => EP has seen (not CRT candidate with narrow QRS).  I will continue amiodarone and increase bisoprolol to 10 mg bid.  2. CAD: s/p CABG. Grafts patent on LHC this admission, suspect cardiac arrest/VT were scar-related rather than ischemia related. On ASA 81 and statin.  3. Acute on chronic systolic CHF: EF 16% with RV dysfunction. Co-ox has been good. JVP remains high. BP stable. Creatinine elevated but stable to slightly down.  He diuresed well yesterday.  - Would continue to diurese with Lasix 40 mg IV bid today and follow UOP and creatinine.  - Continue hydralazine/nitrates for afterload reduction (hold off on ACEI with AKI).  - Add spironolactone 12.5 mg daily.   4. AKI on CKD: Creatinine down a bit today. Suspect initial rise was hemodynamically-mediated,  related to cardiac arrest/hypotension.  5. PT/OT => will try to get him into inpatient rehab eventually.   Marca Ancona 11/07/2013 7:29 AM

## 2013-11-08 LAB — CBC
HCT: 38.5 % — ABNORMAL LOW (ref 39.0–52.0)
Hemoglobin: 13.1 g/dL (ref 13.0–17.0)
MCH: 30.8 pg (ref 26.0–34.0)
MCHC: 34 g/dL (ref 30.0–36.0)
MCV: 90.6 fL (ref 78.0–100.0)
Platelets: 149 10*3/uL — ABNORMAL LOW (ref 150–400)
RBC: 4.25 MIL/uL (ref 4.22–5.81)
RDW: 14.2 % (ref 11.5–15.5)
WBC: 7.7 10*3/uL (ref 4.0–10.5)

## 2013-11-08 LAB — GLUCOSE, CAPILLARY
GLUCOSE-CAPILLARY: 100 mg/dL — AB (ref 70–99)
Glucose-Capillary: 92 mg/dL (ref 70–99)

## 2013-11-08 LAB — CULTURE, BLOOD (ROUTINE X 2)
Culture: NO GROWTH
Culture: NO GROWTH

## 2013-11-08 LAB — BASIC METABOLIC PANEL
BUN: 35 mg/dL — ABNORMAL HIGH (ref 6–23)
CALCIUM: 9.1 mg/dL (ref 8.4–10.5)
CO2: 28 mEq/L (ref 19–32)
CREATININE: 1.64 mg/dL — AB (ref 0.50–1.35)
Chloride: 106 mEq/L (ref 96–112)
GFR calc Af Amer: 48 mL/min — ABNORMAL LOW (ref >90)
GFR, EST NON AFRICAN AMERICAN: 41 mL/min — AB (ref >90)
Glucose, Bld: 101 mg/dL — ABNORMAL HIGH (ref 70–99)
Potassium: 3.5 mEq/L — ABNORMAL LOW (ref 3.7–5.3)
Sodium: 149 mEq/L — ABNORMAL HIGH (ref 137–147)

## 2013-11-08 MED ORDER — BISACODYL 5 MG PO TBEC
10.0000 mg | DELAYED_RELEASE_TABLET | Freq: Every day | ORAL | Status: DC | PRN
  Administered 2013-11-11: 10 mg via ORAL
  Filled 2013-11-08 (×2): qty 2

## 2013-11-08 MED ORDER — DOCUSATE SODIUM 100 MG PO CAPS
100.0000 mg | ORAL_CAPSULE | Freq: Two times a day (BID) | ORAL | Status: DC
  Administered 2013-11-08 – 2013-11-13 (×9): 100 mg via ORAL
  Filled 2013-11-08 (×13): qty 1

## 2013-11-08 MED ORDER — POTASSIUM CHLORIDE 20 MEQ/15ML (10%) PO LIQD: 20.0000 meq | Freq: Once | ORAL | Status: AC

## 2013-11-08 MED ORDER — SPIRONOLACTONE 25 MG PO TABS
25.0000 mg | ORAL_TABLET | Freq: Every day | ORAL | Status: DC
  Administered 2013-11-08 – 2013-11-13 (×6): 25 mg via ORAL
  Filled 2013-11-08 (×6): qty 1

## 2013-11-08 MED ORDER — DOCUSATE SODIUM 100 MG PO CAPS
200.0000 mg | ORAL_CAPSULE | Freq: Two times a day (BID) | ORAL | Status: DC | PRN
  Filled 2013-11-08: qty 2

## 2013-11-08 MED ORDER — FUROSEMIDE 10 MG/ML IJ SOLN
60.0000 mg | Freq: Three times a day (TID) | INTRAMUSCULAR | Status: DC
  Administered 2013-11-08 – 2013-11-09 (×4): 60 mg via INTRAVENOUS
  Filled 2013-11-08 (×3): qty 6

## 2013-11-08 NOTE — Progress Notes (Signed)
Pt found voiding in small trash can while sitting in recliner the patient w/dazed affect this shift .Often smiles when info is provided .Short term memory loss noted . Pt stated that the year is 2010 and that "Danae OrleansBush " is president . the patient is always calm and cooperative ,yet ,frequently remove cardiac monitor leads ,etc...Marland Kitchen

## 2013-11-08 NOTE — Progress Notes (Signed)
Patient ID: Lonnie [name], male   DOB: 02/21/1946, 68 y.o.   MRN: [id number]   SUBJECTIVE: No further VT.  Awake and alert this morning.  No dyspnea or chest pain.  He did not diurese particularly well yesterday and CVP 11 this morning.    Scheduled Meds: . amiodarone  150 mg Intravenous Once  . antiseptic oral rinse  15 mL Mouth Rinse QID  . aspirin  81 mg Per Tube Daily  . atorvastatin  80 mg Per Tube q1800  . bisoprolol  10 mg Oral BID  . chlorhexidine  15 mL Mouth Rinse BID  . docusate sodium  100 mg Oral BID  . furosemide  60 mg Intravenous 3 times per day  . heparin  5,000 Units Subcutaneous 3 times per day  . hydrALAZINE  50 mg Oral 3 times per day  . insulin aspart  0-9 Units Subcutaneous 6 times per day  . isosorbide mononitrate  30 mg Oral Daily  . pantoprazole  40 mg Oral Daily  . potassium chloride  20 mEq Oral Once  . potassium chloride  40 mEq Oral Daily  . spironolactone  25 mg Oral Daily   Continuous Infusions: . sodium chloride 10 mL/hr at 11/07/13 2302  . amiodarone (NEXTERONE PREMIX) 360 mg/200 mL dextrose 30 mg/hr (11/08/13 0414)   PRN Meds:.sodium chloride, bisacodyl, fentaNYL, hydrALAZINE, sodium chloride    Filed Vitals:   11/08/13 0400 11/08/13 0500 11/08/13 0513 11/08/13 0600  BP: 124/65 129/75 129/75 124/75  Pulse: 54 54  48  Temp: 98.4 F (36.9 C)     TempSrc: Axillary     Resp: 15 19  11   Height:      Weight:      SpO2: 97% 91%  87%    Intake/Output Summary (Last 24 hours) at 11/08/13 0728 Last data filed at 11/08/13 0500  Gross per 24 hour  Intake 1806.7 ml  Output   2600 ml  Net -793.3 ml    LABS: Basic Metabolic Panel:  Recent Labs  40/98/11 2203 11/07/13 0430 11/08/13 0400  NA 147 147 149*  K 3.4* 3.7 3.5*  CL 104 106 106  CO2 27 28 28   GLUCOSE 135* 104* 101*  BUN 33* 33* 35*  CREATININE 1.72* 1.67* 1.64*  CALCIUM 9.4 9.1 9.1  MG 1.9  --   --    Liver Function Tests: No results found for this basename: AST, ALT,  ALKPHOS, BILITOT, PROT, ALBUMIN,  in the last 72 hours No results found for this basename: LIPASE, AMYLASE,  in the last 72 hours CBC:  Recent Labs  11/07/13 0430 11/08/13 0400  WBC 8.6 7.7  HGB 12.8* 13.1  HCT 37.7* 38.5*  MCV 90.6 90.6  PLT 141* 149*   Cardiac Enzymes: No results found for this basename: CKTOTAL, CKMB, CKMBINDEX, TROPONINI,  in the last 72 hours BNP: No components found with this basename: POCBNP,  D-Dimer: No results found for this basename: DDIMER,  in the last 72 hours Hemoglobin A1C: No results found for this basename: HGBA1C,  in the last 72 hours Fasting Lipid Panel: No results found for this basename: CHOL, HDL, LDLCALC, TRIG, CHOLHDL, LDLDIRECT,  in the last 72 hours Thyroid Function Tests: No results found for this basename: TSH, T4TOTAL, FREET3, T3FREE, THYROIDAB,  in the last 72 hours Anemia Panel: No results found for this basename: VITAMINB12, FOLATE, FERRITIN, TIBC, IRON, RETICCTPCT,  in the last 72 hours  RADIOLOGY: Ct Head Wo Contrast  11/01/2013  CLINICAL DATA:  68 year old male with altered mental status.  EXAM: CT HEAD WITHOUT CONTRAST  TECHNIQUE: Contiguous axial images were obtained from the base of the skull through the vertex without intravenous contrast.  COMPARISON:  03/14/2011  FINDINGS: A left occipital infarct appears subacute to remote.  No acute intracranial abnormalities are identified, including mass lesion or mass effect, hydrocephalus, extra-axial fluid collection, midline shift, hemorrhage, or acute infarction. The visualized bony calvarium is unremarkable.  IMPRESSION: No evidence of acute intracranial abnormality.  Left occipital infarct which appears subacute-remote.   Electronically Signed   By: Laveda Abbe M.D.   On: 11/01/2013 18:26   Dg Chest Port 1 View  11/06/2013   CLINICAL DATA:  Bilateral infiltrates, left lower lobe atelectasis.  EXAM: PORTABLE CHEST - 1 VIEW  COMPARISON:  DG CHEST 1V PORT dated 11/05/2013; DG CHEST 1V  PORT dated 11/01/2013; DG CHEST 2 VIEW dated 03/17/2013; DG CHEST 1V PORT dated 02/12/2013  FINDINGS: Grossly unchanged enlarged cardiac silhouette and mediastinal contours post median sternotomy, CABG and aortic valve replacement. Interval extubation and removal of enteric tube. Otherwise, stable positioning of left jugular approach central venous catheter tip projects over the superior SVC. No pneumothorax. Minimally improved aeration along. Persistent heterogeneous opacities about the left hilum. Minimally improved aeration left lower lung with persistent retrocardiac opacities. No new focal airspace opacities. No definite pleural effusion. Surgical clips overlie the caudal aspect of the right thoracic inlet as well as expected location of the gastroesophageal junction. Grossly unchanged bones.  IMPRESSION: 1. Interval extubation and removal of enteric tube. Otherwise, stable position of remaining support apparatus. No pneumothorax. 2. Overall improved aeration along suggests resolving edema and atelectasis. 3. Similar-appearing left perihilar and medial basilar opacities, with differential considerations including residual asymmetric pulmonary edema, atelectasis versus infiltrate. Continued attention on follow-up is recommended.   Electronically Signed   By: Simonne Come M.D.   On: 11/06/2013 07:32   Dg Chest Port 1 View  11/05/2013   CLINICAL DATA:  Evaluate endotracheal tube. Evaluate pulmonary edema.  EXAM: PORTABLE CHEST - 1 VIEW  COMPARISON:  DG CHEST 1V PORT dated 11/04/2013  FINDINGS: Endotracheal tube is appropriately positioned, terminating 3.7 cm above carina. Mitral valve repair. Nasogastric tube is poorly visualized distally, but likely extends beyond the inferior aspect of the film. Left internal jugular line terminates at the mid SVC.  Cardiomegaly accentuated by AP portable technique. Right hemidiaphragm elevation. No pleural effusion or pneumothorax. Inferior right upper lobe airspace disease is  unchanged. Worsened left base aeration with developing consolidation. Mild interstitial edema is similar.  IMPRESSION: Worsened left base aeration, suspicious for atelectasis or infection.  Mild interstitial edema with similar right upper lobe airspace disease, favored to represent atelectasis.   Electronically Signed   By: Jeronimo Greaves M.D.   On: 11/05/2013 07:40   Dg Chest Port 1 View  11/04/2013   CLINICAL DATA:  Ventilator dependence.  EXAM: PORTABLE CHEST - 1 VIEW  COMPARISON:  Multiple recent previous exams.  FINDINGS: 0719 hrs. Endotracheal tube tip is 3.1 cm above the base of the carina. The left IJ central venous catheter tip overlies the expected location of the innominate vein confluence. The tip may be directed against the lateral wall of the vessel. The NG tube passes into the stomach although the distal tip position is not included on the film.  The cardio pericardial silhouette is enlarged. The patient is status post CABG and cardiac valve replacement. Pulmonary vascular congestion persists. Right parahilar atelectasis noted.  IMPRESSION: No substantial interval change in exam. Cardiomegaly with vascular congestion and probable interstitial edema.  Right parahilar atelectasis.   Electronically Signed   By: Kennith CenterEric  Mansell M.D.   On: 11/04/2013 07:36   Dg Chest Port 1 View  11/03/2013   CLINICAL DATA:  Follow-up pneumonia, shortness of breath.  EXAM: PORTABLE CHEST - 1 VIEW  COMPARISON:  Chest radiograph November 01, 1998 and the  FINDINGS: Endotracheal tube tip projects 3.1 cm above the carina. Nasogastric tube at the gastroesophageal junction the distal tip is not imaged. Catheter projects over the lower chest. Left internal jugular central venous catheter with distal tip projecting in proximal superior vena cava. Pacer pad over the heart.  The cardiac silhouette appears at least mildly enlarged, status post sternotomy for cardiac valve replacement. Similar central pulmonary vasculature fullness  and interstitial prominence, no definite pleural effusions the the left costophrenic angle is incompletely imaged. No pneumothorax.  Multiple EKG lines overlie the patient and may obscure subtle underlying pathology.  IMPRESSION: No apparent change in position of life-support lines.  Stable cardiomegaly and mild to moderate pulmonary edema.   Electronically Signed   By: Awilda Metroourtnay  Bloomer   On: 11/03/2013 05:29   PHYSICAL EXAM General: NAD Neck: JVP 10-12 cm, no thyromegaly or thyroid nodule.  Lungs: Crackles at bases bilaterally.  CV: Nondisplaced PMI.  Heart regular S1/S2, +S3, no murmur.  Trace ankle edema.  No carotid bruit.  Abdomen: Soft, nontender, no hepatosplenomegaly, no distention.  Neurologic: Alert and oriented x 3.  Psych: Normal affect. Extremities: No clubbing or cyanosis.   TELEMETRY: Reviewed telemetry pt in NSR  ASSESSMENT AND PLAN: 68 yo with history of CAD s/p CABG, ischemic CMP, CKD, and bioprosthetic MVR now s/p cardiac arrest. 1. Cardiac arrest: Suspect ventricular fibrillation, scar-related. Status post hypothermia, now extubated.  He had recurrent VT 2/28 am requiring DCCV.  He is back on amiodarone. He will need ICD => EP has seen (not CRT candidate with narrow QRS).   2. CAD: s/p CABG. Grafts patent on LHC this admission, suspect cardiac arrest/VT were scar-related rather than ischemia related. On ASA 81 and statin.  3. Acute on chronic systolic CHF: EF 96%15% with RV dysfunction. Co-ox has been good. JVP remains high. BP stable. Creatinine elevated but stable to slightly down.  Diuresis was slow yesterday, CVP remains 11.  - Lasix 60 mg IV every 8 hrs today, replete K - Continue hydralazine/nitrates for afterload reduction (hold off on ACEI with AKI).  - Increase spironolactone to 25 daily - Continue current bisoprolol.   4. AKI on CKD: Creatinine down a bit today. Suspect initial rise was hemodynamically-mediated, related to cardiac arrest/hypotension.  5. PT/OT =>  will try to get him into inpatient rehab eventually.  6. Hypernatremia: Encourage po fluid intake today.  7. May go to step down.   Marca AnconaDalton Serafina Topham 11/08/2013 7:28 AM

## 2013-11-09 DIAGNOSIS — I5022 Chronic systolic (congestive) heart failure: Secondary | ICD-10-CM

## 2013-11-09 LAB — CBC
HCT: 41.8 % (ref 39.0–52.0)
Hemoglobin: 14.5 g/dL (ref 13.0–17.0)
MCH: 31.3 pg (ref 26.0–34.0)
MCHC: 34.7 g/dL (ref 30.0–36.0)
MCV: 90.1 fL (ref 78.0–100.0)
PLATELETS: 193 10*3/uL (ref 150–400)
RBC: 4.64 MIL/uL (ref 4.22–5.81)
RDW: 14.1 % (ref 11.5–15.5)
WBC: 11.4 10*3/uL — AB (ref 4.0–10.5)

## 2013-11-09 LAB — BASIC METABOLIC PANEL
BUN: 39 mg/dL — ABNORMAL HIGH (ref 6–23)
CALCIUM: 9.8 mg/dL (ref 8.4–10.5)
CO2: 30 meq/L (ref 19–32)
CREATININE: 1.85 mg/dL — AB (ref 0.50–1.35)
Chloride: 96 mEq/L (ref 96–112)
GFR calc Af Amer: 41 mL/min — ABNORMAL LOW (ref >90)
GFR, EST NON AFRICAN AMERICAN: 36 mL/min — AB (ref >90)
Glucose, Bld: 97 mg/dL (ref 70–99)
Potassium: 3.5 mEq/L — ABNORMAL LOW (ref 3.7–5.3)
Sodium: 141 mEq/L (ref 137–147)

## 2013-11-09 MED ORDER — AMIODARONE HCL 200 MG PO TABS
400.0000 mg | ORAL_TABLET | Freq: Two times a day (BID) | ORAL | Status: DC
  Administered 2013-11-09 – 2013-11-13 (×9): 400 mg via ORAL
  Filled 2013-11-09 (×11): qty 2

## 2013-11-09 MED ORDER — FUROSEMIDE 40 MG PO TABS
60.0000 mg | ORAL_TABLET | Freq: Two times a day (BID) | ORAL | Status: DC
  Administered 2013-11-09: 60 mg via ORAL
  Filled 2013-11-09 (×4): qty 1

## 2013-11-09 NOTE — Progress Notes (Signed)
CARDIAC REHAB PHASE I   PRE:  Rate/Rhythm: 56 SB  BP:  Supine:   Sitting: 94/50  Standing:    SaO2: 92 RA  MODE:  Ambulation: 250 ft   POST:  Rate/Rhythm: 60 SR  BP:  Supine:   Sitting: 91/50  Standing:    SaO2: 96 RA 1420-1455 Assisted X 1 used walker and gait belt to ambulate, Pt leans to the right walking and lifts his feet high with stepping. He was able to walk 250 feet without c/o of cp or SOB. VS stable after walk. Pt to recliner after walk with call light in reach and wife present.  Melina CopaLisa Kariya Lavergne RN 11/09/2013 2:54 PM  \

## 2013-11-09 NOTE — Progress Notes (Signed)
SUBJECTIVE: The patient is doing well today.  He remains quite encephalopathic.  At this time, he denies chest pain, shortness of breath, or any new concerns.  Marland Kitchen. amiodarone  400 mg Oral BID  . aspirin  81 mg Per Tube Daily  . atorvastatin  80 mg Per Tube q1800  . bisoprolol  10 mg Oral BID  . docusate sodium  100 mg Oral BID  . furosemide  60 mg Oral BID  . heparin  5,000 Units Subcutaneous 3 times per day  . hydrALAZINE  50 mg Oral 3 times per day  . isosorbide mononitrate  30 mg Oral Daily  . pantoprazole  40 mg Oral Daily  . potassium chloride  40 mEq Oral Daily  . spironolactone  25 mg Oral Daily   . sodium chloride 10 mL/hr at 11/07/13 2302    OBJECTIVE: Physical Exam: Filed Vitals:   11/09/13 0500 11/09/13 0516 11/09/13 0600 11/09/13 0800  BP:  105/58 111/59 117/60  Pulse:      Temp:    98.2 F (36.8 C)  TempSrc:    Oral  Resp: 17  18 17   Height:      Weight:      SpO2:    93%    Intake/Output Summary (Last 24 hours) at 11/09/13 0849 Last data filed at 11/09/13 0800  Gross per 24 hour  Intake 1870.8 ml  Output   3175 ml  Net -1304.2 ml    Telemetry reveals sinus rhythm  GEN- The patient is well appearing, alert but confused Head- normocephalic, atraumatic Eyes-  Sclera clear, conjunctiva pink Ears- hearing intact Oropharynx- clear Neck- supple , L neck CVL in place Lungs- Clear to ausculation bilaterally, normal work of breathing Heart- Regular rate and rhythm  GI- soft, NT, ND, + BS Extremities- no clubbing, cyanosis, or edema Skin- no rash or lesion Psych- confused Neuro- strength and sensation are intact  LABS: Basic Metabolic Panel:  Recent Labs  36/64/4002/27/15 2203  11/08/13 0400 11/09/13 0500  NA 147  < > 149* 141  K 3.4*  < > 3.5* 3.5*  CL 104  < > 106 96  CO2 27  < > 28 30  GLUCOSE 135*  < > 101* 97  BUN 33*  < > 35* 39*  CREATININE 1.72*  < > 1.64* 1.85*  CALCIUM 9.4  < > 9.1 9.8  MG 1.9  --   --   --   < > = values in this  interval not displayed. Liver Function Tests: No results found for this basename: AST, ALT, ALKPHOS, BILITOT, PROT, ALBUMIN,  in the last 72 hours No results found for this basename: LIPASE, AMYLASE,  in the last 72 hours CBC:  Recent Labs  11/08/13 0400 11/09/13 0500  WBC 7.7 11.4*  HGB 13.1 14.5  HCT 38.5* 41.8  MCV 90.6 90.1  PLT 149* 193   ASSESSMENT AND PLAN:  Active Problems:   Cardiac arrest  The patient has an ischemic CM (EF most recently 15%), NYHA Class III CHF, and CAD. He is recovering from VT/VF arrest for which he required defibrillation in the field.  He has had recurrent VT here and is now transitioning from IV to PO amiodarone. He is still quite confused and has a ways to go with recovery.  I do think that he would be a good candidate for ICD implantation for secondary treatment of VT/VF. He will not be ready for this for several days.  I would  anticipate possible discharge about 24 hours prior to planned discharge if his mental status recovers.  If he remains profoundly encephalopathic then lifevest would also be an option. I agree with transfer to telemetry and switching to oral amiodarone. EP to follow along with general cardiology. Please call with questions.   Hillis Range, MD 11/09/2013 8:49 AM

## 2013-11-09 NOTE — Progress Notes (Addendum)
Clinical Social Work Department CLINICAL SOCIAL WORK PLACEMENT NOTE 11/09/2013  Patient:  Lonnie [name],Lonnie [name]  Account Number:  [id number] Admit date:  11/01/2013  Clinical Social Worker:  Maryclare LabradorJULIE Varetta Chavers, Theresia MajorsLCSWA  Date/time:  11/09/2013 03:04 PM  Clinical Social Work is seeking post-discharge placement for this patient at the following level of care:   SKILLED NURSING   (*CSW will update this form in Epic as items are completed)   N/A-pt's wife only requested clinicals [facility] [place]  Patient/family provided with [facility] Department of Clinical Social Work's list of facilities offering this level of care within the geographic area requested by the patient (or if unable, by the patient's family).  11/09/2013  Patient/family informed of their freedom to choose among providers that offer the needed level of care, that participate in Medicare, Medicaid or managed care program needed by the patient, have an available bed and are willing to accept the patient.  N/A-clincials not sent to this SNF  Patient/family informed of [facility]' ownership interest in [facility], as well as of the fact that they are under no obligation to receive care at this facility.  PASARR submitted to E[facility] on 11/09/2013 PASARR number received from [employer] on 11/09/2013  FL2 transmitted to all facilities in geographic area requested by pt/family on  11/09/2013 FL2 transmitted to all facilities within larger geographic area on   Patient informed that his/her managed care company has contracts with or will negotiate with  certain facilities, including the following:     Patient/family informed of bed offers received: 11/09/13  [facility] [place] Physician recommends and patient chooses bed at    [facility] on   Patient to be transferred to facility by   The following physician request were entered in Epic:   Additional Comments:   Maryclare LabradorJulie  Krisanne Lich, MSW, [facility]WA Clinical Social Worker [phone]

## 2013-11-09 NOTE — Progress Notes (Signed)
Speech Language Pathology Treatment: Dysphagia  Patient Details Name: Lonnie BerkshireRobert L [name] MRN: [id number] DOB: 07-02-1946 Today's Date: 11/09/2013 Time: [id number] SLP Time Calculation (min): 22 min  Assessment / Plan / Recommendation Clinical Impression  Pt presents with ongoing oral dysphagia with solids, due to cognitive deficits. Pt with impulsive intake but slow mastication and oral holding with solids. Will upgrade to dys 3 (mehcanical soft) but will continue to follow pt. Provided max cueing for sustained attention to basic functional tasks and repeated orientation trials with severely impaired working memory.    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 Intubated from 2/22 to 2/26.    Pertinent Vitals NA  SLP Plan  Continue with current plan of care    Recommendations Diet recommendations: Dysphagia 3 (mechanical soft);Thin liquid Liquids provided via: Straw;Cup Medication Administration: Whole meds with liquid Supervision: Staff to assist with self feeding Compensations: Slow rate;Small sips/bites Postural Changes and/or Swallow Maneuvers: Seated upright 90 degrees              Oral Care Recommendations: Oral care BID Follow up Recommendations: Skilled Nursing facility Plan: Continue with current plan of care    GO    [facility] Missael Ferrari, MA CCC-SLP [phone]  Claudine MoutonDeBlois, Hy Swiatek Caroline 11/09/2013, 4:31 PM

## 2013-11-09 NOTE — Progress Notes (Addendum)
Patient ID: Lonnie [name], male   DOB: 10/20/1945, 67 y.o.   MRN: [id number]    SUBJECTIVE: No further VT.  Asleep this morning.  Was awake all night.  Per nursing, there has been confusion but wife says that he recognizes family and knows where he is.  No dyspnea or chest pain.  Reasonable UOP, CVP 7 and creatinine rising.      Scheduled Meds: . amiodarone  150 mg Intravenous Once  . aspirin  81 mg Per Tube Daily  . atorvastatin  80 mg Per Tube q1800  . bisoprolol  10 mg Oral BID  . docusate sodium  100 mg Oral BID  . heparin  5,000 Units Subcutaneous 3 times per day  . hydrALAZINE  50 mg Oral 3 times per day  . isosorbide mononitrate  30 mg Oral Daily  . pantoprazole  40 mg Oral Daily  . potassium chloride  40 mEq Oral Daily  . spironolactone  25 mg Oral Daily   Continuous Infusions: . sodium chloride 10 mL/hr at 11/07/13 2302  . amiodarone (NEXTERONE PREMIX) 360 mg/200 mL dextrose 30 mg/hr (11/09/13 0354)   PRN Meds:.sodium chloride, bisacodyl, docusate sodium, hydrALAZINE, sodium chloride    Filed Vitals:   11/09/13 0400 11/09/13 0500 11/09/13 0516 11/09/13 0600  BP: 101/51  105/58 111/59  Pulse:      Temp:      TempSrc:      Resp: 16 17  18   Height:      Weight:      SpO2: 91%       Intake/Output Summary (Last 24 hours) at 11/09/13 0730 Last data filed at 11/09/13 0600  Gross per 24 hour  Intake 1844.1 ml  Output   3175 ml  Net -1330.9 ml    LABS: Basic Metabolic Panel:  Recent Labs  40/98/11 2203  11/08/13 0400 11/09/13 0500  NA 147  < > 149* 141  K 3.4*  < > 3.5* 3.5*  CL 104  < > 106 96  CO2 27  < > 28 30  GLUCOSE 135*  < > 101* 97  BUN 33*  < > 35* 39*  CREATININE 1.72*  < > 1.64* 1.85*  CALCIUM 9.4  < > 9.1 9.8  MG 1.9  --   --   --   < > = values in this interval not displayed. Liver Function Tests: No results found for this basename: AST, ALT, ALKPHOS, BILITOT, PROT, ALBUMIN,  in the last 72 hours No results found for this basename:  LIPASE, AMYLASE,  in the last 72 hours CBC:  Recent Labs  11/08/13 0400 11/09/13 0500  WBC 7.7 11.4*  HGB 13.1 14.5  HCT 38.5* 41.8  MCV 90.6 90.1  PLT 149* 193   Cardiac Enzymes: No results found for this basename: CKTOTAL, CKMB, CKMBINDEX, TROPONINI,  in the last 72 hours BNP: No components found with this basename: POCBNP,  D-Dimer: No results found for this basename: DDIMER,  in the last 72 hours Hemoglobin A1C: No results found for this basename: HGBA1C,  in the last 72 hours Fasting Lipid Panel: No results found for this basename: CHOL, HDL, LDLCALC, TRIG, CHOLHDL, LDLDIRECT,  in the last 72 hours Thyroid Function Tests: No results found for this basename: TSH, T4TOTAL, FREET3, T3FREE, THYROIDAB,  in the last 72 hours Anemia Panel: No results found for this basename: VITAMINB12, FOLATE, FERRITIN, TIBC, IRON, RETICCTPCT,  in the last 72 hours  RADIOLOGY: Ct Head Wo Contrast  11/01/2013   CLINICAL DATA:  68 year old male with altered mental status.  EXAM: CT HEAD WITHOUT CONTRAST  TECHNIQUE: Contiguous axial images were obtained from the base of the skull through the vertex without intravenous contrast.  COMPARISON:  03/14/2011  FINDINGS: A left occipital infarct appears subacute to remote.  No acute intracranial abnormalities are identified, including mass lesion or mass effect, hydrocephalus, extra-axial fluid collection, midline shift, hemorrhage, or acute infarction. The visualized bony calvarium is unremarkable.  IMPRESSION: No evidence of acute intracranial abnormality.  Left occipital infarct which appears subacute-remote.   Electronically Signed   By: Laveda Abbe M.D.   On: 11/01/2013 18:26   Dg Chest Port 1 View  11/06/2013   CLINICAL DATA:  Bilateral infiltrates, left lower lobe atelectasis.  EXAM: PORTABLE CHEST - 1 VIEW  COMPARISON:  DG CHEST 1V PORT dated 11/05/2013; DG CHEST 1V PORT dated 11/01/2013; DG CHEST 2 VIEW dated 03/17/2013; DG CHEST 1V PORT dated 02/12/2013   FINDINGS: Grossly unchanged enlarged cardiac silhouette and mediastinal contours post median sternotomy, CABG and aortic valve replacement. Interval extubation and removal of enteric tube. Otherwise, stable positioning of left jugular approach central venous catheter tip projects over the superior SVC. No pneumothorax. Minimally improved aeration along. Persistent heterogeneous opacities about the left hilum. Minimally improved aeration left lower lung with persistent retrocardiac opacities. No new focal airspace opacities. No definite pleural effusion. Surgical clips overlie the caudal aspect of the right thoracic inlet as well as expected location of the gastroesophageal junction. Grossly unchanged bones.  IMPRESSION: 1. Interval extubation and removal of enteric tube. Otherwise, stable position of remaining support apparatus. No pneumothorax. 2. Overall improved aeration along suggests resolving edema and atelectasis. 3. Similar-appearing left perihilar and medial basilar opacities, with differential considerations including residual asymmetric pulmonary edema, atelectasis versus infiltrate. Continued attention on follow-up is recommended.   Electronically Signed   By: Simonne Come M.D.   On: 11/06/2013 07:32   Dg Chest Port 1 View  11/05/2013   CLINICAL DATA:  Evaluate endotracheal tube. Evaluate pulmonary edema.  EXAM: PORTABLE CHEST - 1 VIEW  COMPARISON:  DG CHEST 1V PORT dated 11/04/2013  FINDINGS: Endotracheal tube is appropriately positioned, terminating 3.7 cm above carina. Mitral valve repair. Nasogastric tube is poorly visualized distally, but likely extends beyond the inferior aspect of the film. Left internal jugular line terminates at the mid SVC.  Cardiomegaly accentuated by AP portable technique. Right hemidiaphragm elevation. No pleural effusion or pneumothorax. Inferior right upper lobe airspace disease is unchanged. Worsened left base aeration with developing consolidation. Mild interstitial  edema is similar.  IMPRESSION: Worsened left base aeration, suspicious for atelectasis or infection.  Mild interstitial edema with similar right upper lobe airspace disease, favored to represent atelectasis.   Electronically Signed   By: Jeronimo Greaves M.D.   On: 11/05/2013 07:40   Dg Chest Port 1 View  11/04/2013   CLINICAL DATA:  Ventilator dependence.  EXAM: PORTABLE CHEST - 1 VIEW  COMPARISON:  Multiple recent previous exams.  FINDINGS: 0719 hrs. Endotracheal tube tip is 3.1 cm above the base of the carina. The left IJ central venous catheter tip overlies the expected location of the innominate vein confluence. The tip may be directed against the lateral wall of the vessel. The NG tube passes into the stomach although the distal tip position is not included on the film.  The cardio pericardial silhouette is enlarged. The patient is status post CABG and cardiac valve replacement. Pulmonary vascular congestion persists. Right  parahilar atelectasis noted.  IMPRESSION: No substantial interval change in exam. Cardiomegaly with vascular congestion and probable interstitial edema.  Right parahilar atelectasis.   Electronically Signed   By: Kennith CenterEric  Mansell M.D.   On: 11/04/2013 07:36   Dg Chest Port 1 View  11/03/2013   CLINICAL DATA:  Follow-up pneumonia, shortness of breath.  EXAM: PORTABLE CHEST - 1 VIEW  COMPARISON:  Chest radiograph November 01, 1998 and the  FINDINGS: Endotracheal tube tip projects 3.1 cm above the carina. Nasogastric tube at the gastroesophageal junction the distal tip is not imaged. Catheter projects over the lower chest. Left internal jugular central venous catheter with distal tip projecting in proximal superior vena cava. Pacer pad over the heart.  The cardiac silhouette appears at least mildly enlarged, status post sternotomy for cardiac valve replacement. Similar central pulmonary vasculature fullness and interstitial prominence, no definite pleural effusions the the left costophrenic  angle is incompletely imaged. No pneumothorax.  Multiple EKG lines overlie the patient and may obscure subtle underlying pathology.  IMPRESSION: No apparent change in position of life-support lines.  Stable cardiomegaly and mild to moderate pulmonary edema.   Electronically Signed   By: Awilda Metroourtnay  Bloomer   On: 11/03/2013 05:29   PHYSICAL EXAM General: NAD Neck: JVP 8 cm, no thyromegaly or thyroid nodule.  Lungs: Crackles at bases bilaterally.  CV: Nondisplaced PMI.  Heart regular S1/S2, +S3, no murmur.  No edema.  No carotid bruit.  Abdomen: Soft, nontender, no hepatosplenomegaly, no distention.  Neurologic: Asleep this morning  Psych: Normal affect. Extremities: No clubbing or cyanosis.   TELEMETRY: Reviewed telemetry pt in NSR  ASSESSMENT AND PLAN: 68 yo with history of CAD s/p CABG, ischemic CMP, CKD, and bioprosthetic MVR now s/p cardiac arrest. 1. Cardiac arrest: Suspect ventricular fibrillation, scar-related. Status post hypothermia, now extubated.  He had recurrent VT 2/28 am requiring DCCV.  He is back on amiodarone, will make po today. He will need ICD eventually => EP has seen (not CRT candidate with narrow QRS).   2. CAD: s/p CABG. Grafts patent on LHC this admission, suspect cardiac arrest/VT were scar-related rather than ischemia related. On ASA 81 and statin.  3. Acute on chronic systolic CHF: EF 09%15% with RV dysfunction. Co-ox has been good. Good diuresis with CVP 7.  Creatinine up to 1.8.   - Convert to po Lasix today.  - Continue hydralazine/nitrates for afterload reduction (hold off on ACEI with AKI).  - Continue spironolactone.  - Continue current bisoprolol.   4. AKI on CKD: Creatinine higher today, Lasix to po. 5. PT/OT => will try to get him into inpatient rehab eventually.  6. Hypernatremia: Much improved.  7. Neuro: There is some anoxic encephalopathy.  He has been confused but recognizes family and knows he is in hospital.  Hopefully to improve.  8. May go to  telemetry.  Will remove central line.    Marca AnconaDalton Porschia [name] 11/09/2013 7:30 AM

## 2013-11-09 NOTE — Progress Notes (Signed)
[facility] and confirmed family wants the bed at the SNF. Facility has submitted for insurance auth from [employer], and states they just need insurance auth before pt comes to facility. CSW to check in with facility tomorrow, as they believe they will have auth by tomorrow. CSW informed pt's wife of this, and she expressed understanding. Wife thanked CSW for bed at SNF, stating this is better for the family as he is familiar with people at the facility and it is closer to family/friends and they can visit more easily. CSW called [facility] and informed admissions that plan for discharge has changed and CSW wanted to be sure pt wouldn't be charged for [facility] consult and CIR is aware of discharge plan. Admissions called CSW back and left a voicemail stating they got CSW message and are updated on discharge plan. CSW paged MD and informed him of change in discharge plan, and MD states he will sign FL2. CSW following case and will facilitate discharge to [facility] when pt is ready for discharge.   Maryclare LabradorJulie Kalev Temme, MSW, [facility]WA Clinical Social Worker [phone]

## 2013-11-09 NOTE — Progress Notes (Signed)
Rehab admissions - Received a call from Raynelle FanningJulie, Child psychotherapistsocial worker informing me that pt/family are preferring to go to skilled nursing [place]. At this time, we will sign off as pt is choosing SNF. Please call me with any questions. Thanks.  Juliann MuleJanine Kaysia Willard, PT Rehabilitation Admissions Coordinator [phone]

## 2013-11-09 NOTE — Progress Notes (Signed)
Clinical Social Work Department BRIEF PSYCHOSOCIAL ASSESSMENT 11/09/2013  Patient:  Lonnie [name],Lonnie L     Account Number:  [id number]     Admit date:  11/01/2013  Clinical Social Worker:  Varney BilesANDERSON,Veida Spira, LCSWA  Date/Time:  11/09/2013 02:56 PM  Referred by:  Physician  Date Referred:  11/09/2013 Referred for  SNF Placement   Other Referral:   Interview type:  Patient Other interview type:   Pt's wife came in the room while CSW was speaking with pt, and states pt is unable to comprehend CSW conversation; pt is confused.    PSYCHOSOCIAL DATA Living Status:  FAMILY Admitted from facility:   Level of care:   Primary support name:  Lonnie [name] [phone]) Primary support relationship to patient:  SPOUSE Degree of support available:   Good--pt lives [place], [place]    CURRENT CONCERNS Current Concerns  Post-Acute Placement   Other Concerns:    SOCIAL WORK ASSESSMENT / PLAN CSW came to pt's room to discuss back-up plan if pt is unable to get a bed at [facility]. Pt's wife walked in room during discussion and explained to CSW that pt is confused and has been unable to follow complex conversations. CSW explained back-up plan rationale for SNF, and pt's wife states [facility] and she would like him to go to this facility instead of [facility]. [facility], and pt's wife called admissions. Admissions states they will look over everything, they are contracted with his insurance, and that they will likely take him. Facility responded "yes" on [employer] for bed offer, and CSW texted RNCM asking her to inform pt's wife that he can go [facility] whenever he is ready for discharge. Pt's wife thanked CSW. CSW put sticky note on pt's chart for MD, stating [facility] has [facility] and can go whenever ready for discharge. CSW placed FL2 on pt's chart to be signed.   Assessment/plan status:  Psychosocial  Support/Ongoing Assessment of Needs Other assessment/ plan:   Information/referral to community resources:   [name]    PATIENT'S/FAMILY'S RESPONSE TO PLAN OF CARE: Good--pt pleasant and slightly confused during inital conversation with CSW, nodding his head and offering short answers to CSW questions. Pt's wife Lonnie [name] very friendly and thanked CSW for assistance with referral to [facility] [place], stating this is much closer to their home and she would prefer he goes there rather than [facility]. CSW made referral, and facility is able to offer pt a bed. CSW informed RNCM via text message, and RNCM informed pt's wife. Wife thanked CSW, relieved pt can be closer to their home.       Maryclare LabradorJulie Bernardina [name], MSW, [facility]SWA Clinical Social Worker [phone]

## 2013-11-09 NOTE — Progress Notes (Signed)
OT Cancellation Note  Patient Details Name: Lonnie BerkshireRobert L [name] MRN: [id number] DOB: 22-Jan-1946   Cancelled Treatment:    Reason Eval/Treat Not Completed: Other (comment) (being fed by staff. will see in am)  [facility] Porter Psychiatric InstituteWARD,HILLARY Shean Gerding, OTR/L  [phone] 11/09/2013 11/09/2013, 6:30 PM

## 2013-11-09 NOTE — Progress Notes (Signed)
Physical Therapy Treatment Patient Details Name: Lonnie [name] MRN: [id number] DOB: Dec 22, 1945 Today's Date: 11/09/2013 Time: [id number] PT Time Calculation (min): 27 min  PT Assessment / Plan / Recommendation  History of Present Illness Pt admit with cardiac arrest.  Anoxic encephalopathy and VDRF.     PT Comments   Pt progressing with gait but poor cognition and very high fall risk with limited balance, awareness and safety. Spouse and friend present throughout and educated for need for assist with all mobility, pt lack of careover with education and balance training, and educated for assist with some cognitive cueing while visiting. Will continue to follow and recommend CIR.  Follow Up Recommendations  CIR;Supervision/Assistance - 24 hour     Does the patient have the potential to tolerate intense rehabilitation     Barriers to Discharge        Equipment Recommendations  Rolling walker with 5" wheels    Recommendations for Other Services Speech consult (cognitive eval)  Frequency     Progress towards PT Goals Progress towards PT goals: Progressing toward goals  Plan Current plan remains appropriate    Precautions / Restrictions Precautions Precautions: Fall Restrictions Weight Bearing Restrictions: No   Pertinent Vitals/Pain HR 55-62 No pain O2 stable on RA throughout   Mobility  Transfers Transfers: Sit to/from Stand;Stand Pivot Transfers Sit to Stand: Min assist Stand pivot transfers: Min assist General transfer comment: cueing for safety with min assist for balance Ambulation/Gait Ambulation/Gait assistance: Min assist Ambulation Distance (Feet): 250 Feet Assistive device: Rolling walker (2 wheeled) Gait Pattern/deviations: Step-through pattern;Narrow base of support;Drifts right/left;Staggering right;Trunk flexed;Decreased stride length Gait velocity interpretation: Below normal speed for age/gender General Gait Details: pt with heel to toe gait, scissoring at  time with constant cues for posture and position in RW with assist throughout for balance due to right lean and unsteady gait    Exercises General Exercises - Lower Extremity Hip Flexion/Marching: AROM;Both;10 reps;Standing   PT Diagnosis:    PT Problem List:   PT Treatment Interventions:     PT Goals (current goals can now be found in the care plan section)    Visit Information  Last PT Received On: 11/09/13 Assistance Needed: +1 History of Present Illness: Pt admit with cardiac arrest.  Anoxic encephalopathy and VDRF.      Subjective Data      Cognition  Cognition Arousal/Alertness: Awake/alert Behavior During Therapy: Flat affect Overall Cognitive Status: Impaired/Different from baseline Area of Impairment: Orientation;Attention;Memory;Following commands;Safety/judgement;Problem solving;Awareness Orientation Level: Place;Time;Situation;Person Current Attention Level: Sustained Memory: Decreased short-term memory Following Commands: Follows one step commands consistently Safety/Judgement: Decreased awareness of safety;Decreased awareness of deficits Awareness: Intellectual Problem Solving: Slow processing;Decreased initiation;Difficulty sequencing;Requires verbal cues;Requires tactile cues General Comments: pt unable to recall birthdate, year, month, place, room number, what animal he hunts or instruction for assistance with mobility despite cueing and repeated education for all     Balance  Balance Overall balance assessment: Needs assistance Sitting-balance support: Feet supported;No upper extremity supported Sitting balance-Leahy Scale: Fair Standing balance-Leahy Scale: Poor Standardized Balance Assessment Standardized Balance Assessment : Berg Balance Test Berg Balance Test Sit to Stand: Able to stand  independently using hands Standing Unsupported: Needs several tries to stand 30 seconds unsupported Sitting with Back Unsupported but Feet Supported on Floor or  Stool: Able to sit 2 minutes under supervision Stand to Sit: Controls descent by using hands Transfers: Able to transfer with verbal cueing and /or supervision Standing Unsupported with Eyes Closed: Able to stand 3  seconds Standing Ubsupported with Feet Together: Able to place feet together independently but unable to hold for 30 seconds From Standing, Reach Forward with Outstretched Arm: Reaches forward but needs supervision From Standing Position, Pick up Object from Floor: Able to pick up shoe, needs supervision From Standing Position, Turn to Look Behind Over each Shoulder: Turn sideways only but maintains balance Turn 360 Degrees: Needs close supervision or verbal cueing Standing Unsupported, Alternately Place Feet on Step/Stool: Needs assistance to keep from falling or unable to try Standing Unsupported, One Foot in Front: Loses balance while stepping or standing Standing on One Leg: Able to lift leg independently and hold equal to or more than 3 seconds Total Score: 25  End of Session PT - End of Session Equipment Utilized During Treatment: Gait belt Activity Tolerance: Patient tolerated treatment well Patient left: in chair;with call bell/phone within reach;with family/visitor present Nurse Communication: Mobility status;Precautions   GP     Lonnie [name] 11/09/2013, 11:13 AM Lonnie [name], PT [phone]

## 2013-11-10 LAB — CBC
HCT: 42.1 % (ref 39.0–52.0)
Hemoglobin: 14.7 g/dL (ref 13.0–17.0)
MCH: 31.2 pg (ref 26.0–34.0)
MCHC: 34.9 g/dL (ref 30.0–36.0)
MCV: 89.4 fL (ref 78.0–100.0)
PLATELETS: 203 10*3/uL (ref 150–400)
RBC: 4.71 MIL/uL (ref 4.22–5.81)
RDW: 13.7 % (ref 11.5–15.5)
WBC: 11.5 10*3/uL — AB (ref 4.0–10.5)

## 2013-11-10 LAB — MAGNESIUM: Magnesium: 2.2 mg/dL (ref 1.5–2.5)

## 2013-11-10 LAB — BASIC METABOLIC PANEL
BUN: 50 mg/dL — ABNORMAL HIGH (ref 6–23)
CALCIUM: 9.2 mg/dL (ref 8.4–10.5)
CO2: 28 mEq/L (ref 19–32)
Chloride: 98 mEq/L (ref 96–112)
Creatinine, Ser: 2.26 mg/dL — ABNORMAL HIGH (ref 0.50–1.35)
GFR calc non Af Amer: 28 mL/min — ABNORMAL LOW (ref >90)
GFR, EST AFRICAN AMERICAN: 33 mL/min — AB (ref >90)
Glucose, Bld: 88 mg/dL (ref 70–99)
Potassium: 3.7 mEq/L (ref 3.7–5.3)
Sodium: 143 mEq/L (ref 137–147)

## 2013-11-10 LAB — CK TOTAL AND CKMB (NOT AT ARMC)
CK TOTAL: 126 U/L (ref 7–232)
CK, MB: 2.1 ng/mL (ref 0.3–4.0)
Relative Index: 1.7 (ref 0.0–2.5)

## 2013-11-10 LAB — TROPONIN I: Troponin I: <0.3 ng/mL (ref <0.30)

## 2013-11-10 MED ORDER — BISOPROLOL FUMARATE 5 MG PO TABS
5.0000 mg | ORAL_TABLET | Freq: Once | ORAL | Status: AC
  Administered 2013-11-10: 5 mg via ORAL
  Filled 2013-11-10: qty 1

## 2013-11-10 MED ORDER — MORPHINE SULFATE 2 MG/ML IJ SOLN
1.0000 mg | Freq: Once | INTRAMUSCULAR | Status: AC
  Administered 2013-11-10: 1 mg via INTRAVENOUS

## 2013-11-10 MED ORDER — NITROGLYCERIN 0.4 MG SL SUBL
SUBLINGUAL_TABLET | SUBLINGUAL | Status: AC
  Administered 2013-11-10 (×3): 0.4 mg
  Filled 2013-11-10: qty 1

## 2013-11-10 MED ORDER — NITROGLYCERIN 0.4 MG SL SUBL
0.4000 mg | SUBLINGUAL_TABLET | SUBLINGUAL | Status: DC | PRN
  Administered 2013-11-10: 0.4 mg via SUBLINGUAL

## 2013-11-10 MED ORDER — MORPHINE SULFATE 2 MG/ML IJ SOLN
INTRAMUSCULAR | Status: AC
  Administered 2013-11-10: 1 mg via INTRAVENOUS
  Filled 2013-11-10: qty 1

## 2013-11-10 NOTE — Progress Notes (Signed)
Spoke with SW about this pt.  Pt to d/c to [facility] in next 24 hours.  [facility]. Tory EmeraldHolly Seferino Oscar, [place] [phone]

## 2013-11-10 NOTE — Progress Notes (Signed)
MD on call paged about BP of 99/52, HR of 51 and if whether to give night time BP and HR medications. Will await for call back and continue to monitor.    Rilen Shukla M

## 2013-11-10 NOTE — Progress Notes (Signed)
MD on call notified about BP and HR. Verbal orders were given to hold Bisprolol and Hydralazine for tonight. Will Continue to monitor.  Suella Cogar M

## 2013-11-10 NOTE — Progress Notes (Signed)
[facility] and explained medical team is considering putting pt on a LifeVest. CSW explained that LifeVest is like a wearable defibrillator, which monitors the patient's heart and delivers a shock if it detects an abnormal rhythm. Most facilities will not [facility] states they are unsure if they can take these and will talk with their administrators. Facility to update CSW when they have an answer.   Maryclare LabradorJulie Lillyanna Glandon, MSW, [facility] HealthCSWA Clinical Social Worker [phone]

## 2013-11-10 NOTE — Progress Notes (Signed)
CARDIAC REHAB PHASE I   PRE:  Rate/Rhythm: 61 SR  BP:  Supine: 96/55  Sitting:   Standing:    SaO2: 94 RA  MODE:  Ambulation: 550 ft   POST:  Rate/Rhythm: 66 SR  BP:  Supine:   Sitting: 85/60 107/62  Standing:    SaO2: 97 RA 1025-1110 On arrival pt only oriented to person. He is disoriented to time and place. Assisted X 1 used gait belt and walker to ambulate. Pt gets off balance easily and takes high steps with walking. Pt able to walk 550 feet. He denies any cp or SOB with walking. Pt to recliner after walk with call light in reach. Reoriented pt many time while I was in room, he was able to remember some things and not other things that we discussed. Wife also present with pt.  Melina CopaLisa Njeri Vicente RN 11/10/2013 11:10 AM

## 2013-11-10 NOTE — Progress Notes (Addendum)
[facility] and said [facility] would be fine with pt discharging to their facility with a LifeVest if medical team determines this would be appropriate. Vest would have to be provided by the hospital. Pt can discharge to facility whenever he is deemed medically stable.  Addendum: Pt transferred to 2W. CSW provided handoff to unit CSW.  Maryclare LabradorJulie Shoshanna Mcquitty, MSW, [facility] CenterCSWA Clinical Social Worker [phone]

## 2013-11-10 NOTE — Progress Notes (Signed)
Patient starting having pain about 0025. He was unable to describe the pain and unable to give an exact location d/t his present mentation and alert and oriented to person only. He was very restless and was grabbing his chest. 12 Lead EKG performed and 3 sublingual nitro given. O2 was started at 2 liters via [place]. Patient was pain free. About 20 minutes later his wife called out to say he was having more pain. Dr. Shirlee LatchMcLean was notified and orders received to give morphine 1 mg IV and another sublingual nitro. Cardiac enzymes x 1 was obtained. Patient was quickly pain free again. Vital signs remained stable. Will continue to monitor and notify MD as needed.

## 2013-11-10 NOTE — Progress Notes (Signed)
EKG CRITICAL VALUE     12 lead EKG performed.  Critical value noted.  Noah CharonPenny Shelton, RN notified.   Ernestina PennaROBERTS, Cuca Benassi M, CCT 11/10/2013 8:10 AM

## 2013-11-10 NOTE — Care Management Note (Signed)
    Page 1 of 2   11/13/2013     3:19:49 PM   CARE MANAGEMENT NOTE 11/13/2013  Patient:  Bethann BerkshireMARTIN,Cora L   Account Number:  [id number]  Date Initiated:  11/02/2013  Documentation initiated by:  Junius CreamerWELL,DEBBIE  Subjective/Objective Assessment:   adm w mi, vent     Action/Plan:   lives w wife   Anticipated DC Date:  11/13/2013   Anticipated DC Plan:  SKILLED NURSING FACILITY  In-house referral  Clinical Social Worker      DC Planning Services  CM consult      Choice offered to / List presented to:             Status of service:  Completed, signed off Medicare Important Message given?   (If response is "NO", the following Medicare IM given date fields will be blank) Date Medicare IM given:   Date Additional Medicare IM given:    Discharge Disposition:  SKILLED NURSING FACILITY  Per UR Regulation:  Reviewed for med. necessity/level of care/duration of stay  If discussed at Long Length of Stay Meetings, dates discussed:   11/10/2013  11/12/2013    Comments:   11/13/13 Raphaela Cannaday,RN,BSN [phone] PT DISCHARGED TO SNF TODAY, PER CSW ARRANGEMENTS.  11/12/13 Cortlandt Capuano,RN,BSN [phone] RECEIVED CALL FROM KELLY LANIER WITH [facility]:  SHE PLANS TO BE HERE TODAY AT 5:30 PM TO FIT PT FOR [facility]. BEDSIDE NURSE TO NOTIFY WIFE TO BE HERE BY THIS TIME TO RECEIVE [employer] EDUCATION.  THIS WILL LIKELY BE TOO LATE FOR PT TO GO TO FACILITY TODAY, SO WILL PROB NEED TO DC IN AM.  11/10/13 Brittney Caraway,RN,BSN [phone] PT S/P VFIB ARREST ON 2/22 WITH ANOXIC INJURY.  PTA, PT RESIDED AT HOME WITH SPOUSE, BUT NEEDS REHAB PRIOR TO DC HOME.  CSW FOLLOWING FOR DC TO SNF WHEN MEDICALLY STABLE. WILL FOLLOW PROGRESS.

## 2013-11-10 NOTE — Progress Notes (Signed)
Patient ID: Lonnie [name], male   DOB: 1945-10-15, 68 y.o.   MRN: [id number]    SUBJECTIVE: No further VT.  This morning, he knows where he is but is not oriented to date or year.  He had diffuse pain apparently in chest/abdomen last night that seems to have possibly been related to his condom catheter.  It improved when this was unkinked and he was able to urinate.  Troponin negative x 1 (checked because of some chest pain also).  ECG showed IVCD with prolonged QT interval.   Scheduled Meds: . amiodarone  400 mg Oral BID  . aspirin  81 mg Per Tube Daily  . atorvastatin  80 mg Per Tube q1800  . bisoprolol  10 mg Oral BID  . docusate sodium  100 mg Oral BID  . heparin  5,000 Units Subcutaneous 3 times per day  . hydrALAZINE  50 mg Oral 3 times per day  . isosorbide mononitrate  30 mg Oral Daily  . pantoprazole  40 mg Oral Daily  . spironolactone  25 mg Oral Daily   Continuous Infusions: . sodium chloride Stopped (11/09/13 0916)   PRN Meds:.sodium chloride, bisacodyl, docusate sodium, hydrALAZINE, nitroGLYCERIN    Filed Vitals:   11/10/13 0048 11/10/13 0115 11/10/13 0127 11/10/13 0417  BP: 88/59  99/58 100/62  Pulse:      Temp:    98 F (36.7 C)  TempSrc:    Oral  Resp: 14 17 15 18   Height:      Weight:      SpO2: 93%   94%    Intake/Output Summary (Last 24 hours) at 11/10/13 0753 Last data filed at 11/10/13 0600  Gross per 24 hour  Intake  873.4 ml  Output   2050 ml  Net -1176.6 ml    LABS: Basic Metabolic Panel:  Recent Labs  40/98/11 0500 11/10/13 0630  NA 141 143  K 3.5* 3.7  CL 96 98  CO2 30 28  GLUCOSE 97 88  BUN 39* 50*  CREATININE 1.85* 2.26*  CALCIUM 9.8 9.2   Liver Function Tests: No results found for this basename: AST, ALT, ALKPHOS, BILITOT, PROT, ALBUMIN,  in the last 72 hours No results found for this basename: LIPASE, AMYLASE,  in the last 72 hours CBC:  Recent Labs  11/09/13 0500 11/10/13 0630  WBC 11.4* 11.5*  HGB 14.5 14.7  HCT  41.8 42.1  MCV 90.1 89.4  PLT 193 203   Cardiac Enzymes:  Recent Labs  11/10/13 0145  CKTOTAL 126  CKMB 2.1  TROPONINI <0.30   BNP: No components found with this basename: POCBNP,  D-Dimer: No results found for this basename: DDIMER,  in the last 72 hours Hemoglobin A1C: No results found for this basename: HGBA1C,  in the last 72 hours Fasting Lipid Panel: No results found for this basename: CHOL, HDL, LDLCALC, TRIG, CHOLHDL, LDLDIRECT,  in the last 72 hours Thyroid Function Tests: No results found for this basename: TSH, T4TOTAL, FREET3, T3FREE, THYROIDAB,  in the last 72 hours Anemia Panel: No results found for this basename: VITAMINB12, FOLATE, FERRITIN, TIBC, IRON, RETICCTPCT,  in the last 72 hours  RADIOLOGY: Ct Head Wo Contrast  11/01/2013   CLINICAL DATA:  68 year old male with altered mental status.  EXAM: CT HEAD WITHOUT CONTRAST  TECHNIQUE: Contiguous axial images were obtained from the base of the skull through the vertex without intravenous contrast.  COMPARISON:  03/14/2011  FINDINGS: A left occipital infarct appears subacute to  remote.  No acute intracranial abnormalities are identified, including mass lesion or mass effect, hydrocephalus, extra-axial fluid collection, midline shift, hemorrhage, or acute infarction. The visualized bony calvarium is unremarkable.  IMPRESSION: No evidence of acute intracranial abnormality.  Left occipital infarct which appears subacute-remote.   Electronically Signed   By: Laveda Abbe M.D.   On: 11/01/2013 18:26   Dg Chest Port 1 View  11/06/2013   CLINICAL DATA:  Bilateral infiltrates, left lower lobe atelectasis.  EXAM: PORTABLE CHEST - 1 VIEW  COMPARISON:  DG CHEST 1V PORT dated 11/05/2013; DG CHEST 1V PORT dated 11/01/2013; DG CHEST 2 VIEW dated 03/17/2013; DG CHEST 1V PORT dated 02/12/2013  FINDINGS: Grossly unchanged enlarged cardiac silhouette and mediastinal contours post median sternotomy, CABG and aortic valve replacement. Interval  extubation and removal of enteric tube. Otherwise, stable positioning of left jugular approach central venous catheter tip projects over the superior SVC. No pneumothorax. Minimally improved aeration along. Persistent heterogeneous opacities about the left hilum. Minimally improved aeration left lower lung with persistent retrocardiac opacities. No new focal airspace opacities. No definite pleural effusion. Surgical clips overlie the caudal aspect of the right thoracic inlet as well as expected location of the gastroesophageal junction. Grossly unchanged bones.  IMPRESSION: 1. Interval extubation and removal of enteric tube. Otherwise, stable position of remaining support apparatus. No pneumothorax. 2. Overall improved aeration along suggests resolving edema and atelectasis. 3. Similar-appearing left perihilar and medial basilar opacities, with differential considerations including residual asymmetric pulmonary edema, atelectasis versus infiltrate. Continued attention on follow-up is recommended.   Electronically Signed   By: Simonne Come M.D.   On: 11/06/2013 07:32   Dg Chest Port 1 View  11/05/2013   CLINICAL DATA:  Evaluate endotracheal tube. Evaluate pulmonary edema.  EXAM: PORTABLE CHEST - 1 VIEW  COMPARISON:  DG CHEST 1V PORT dated 11/04/2013  FINDINGS: Endotracheal tube is appropriately positioned, terminating 3.7 cm above carina. Mitral valve repair. Nasogastric tube is poorly visualized distally, but likely extends beyond the inferior aspect of the film. Left internal jugular line terminates at the mid SVC.  Cardiomegaly accentuated by AP portable technique. Right hemidiaphragm elevation. No pleural effusion or pneumothorax. Inferior right upper lobe airspace disease is unchanged. Worsened left base aeration with developing consolidation. Mild interstitial edema is similar.  IMPRESSION: Worsened left base aeration, suspicious for atelectasis or infection.  Mild interstitial edema with similar right upper  lobe airspace disease, favored to represent atelectasis.   Electronically Signed   By: Jeronimo Greaves M.D.   On: 11/05/2013 07:40   PHYSICAL EXAM General: NAD Neck: JVP 7-8 cm, no thyromegaly or thyroid nodule.  Lungs: Crackles at bases bilaterally.  CV: Nondisplaced PMI.  Heart regular S1/S2, +S3, 1/6 SEM.  No edema.  No carotid bruit.  Abdomen: Soft, nontender, no hepatosplenomegaly, no distention.  Neurologic: alert, oriented to place Psych: Normal affect. Extremities: No clubbing or cyanosis.   TELEMETRY: Reviewed telemetry pt in NSR  ASSESSMENT AND PLAN: 68 yo with history of CAD s/p CABG, ischemic CMP, CKD, and bioprosthetic MVR now s/p cardiac arrest. 1. Cardiac arrest: Suspect ventricular fibrillation, scar-related. Status post hypothermia, now extubated.  He had recurrent VT 2/28 am requiring DCCV.  He is back on amiodarone, now po. He will need ICD eventually => EP has seen (not CRT candidate with narrow QRS).  Will plan reassessment just prior to discharge and if encephalopathy has improved, ICD implantation.    2. CAD: s/p CABG. Grafts patent on LHC this admission, suspect  cardiac arrest/VT were scar-related rather than ischemia related. On ASA 81 and statin.  I do not think that the episode last night was ischemic but will get 1 more set CEs.  3. Acute on chronic systolic CHF: EF 16%15% with RV dysfunction. Co-ox has been good. He has diuresed well.  Creatinine up to 2.2 - Hold Lasix with creatinine up.  - Continue hydralazine/nitrates for afterload reduction (hold off on ACEI with AKI).  - Continue spironolactone.  - Continue current bisoprolol.   4. AKI on CKD: Creatinine higher today, hold Lasix.  5. [facility] [place]  6. Hypernatremia: Much improved.  7. Neuro: There is some anoxic encephalopathy.  Still some confusion.  Will continue to follow. 8. May go to telemetry.  Anticipate he will need 1-2 more days in hospital.  Will reassess for mental status  tomorrow, hopefully ICD prior to discharge.     Marca AnconaDalton McLean 11/10/2013 7:53 AM

## 2013-11-11 LAB — BASIC METABOLIC PANEL
BUN: 55 mg/dL — ABNORMAL HIGH (ref 6–23)
CO2: 27 meq/L (ref 19–32)
Calcium: 9.6 mg/dL (ref 8.4–10.5)
Chloride: 98 mEq/L (ref 96–112)
Creatinine, Ser: 2.06 mg/dL — ABNORMAL HIGH (ref 0.50–1.35)
GFR calc Af Amer: 36 mL/min — ABNORMAL LOW (ref >90)
GFR calc non Af Amer: 31 mL/min — ABNORMAL LOW (ref >90)
GLUCOSE: 89 mg/dL (ref 70–99)
Potassium: 3.9 mEq/L (ref 3.7–5.3)
Sodium: 141 mEq/L (ref 137–147)

## 2013-11-11 MED ORDER — BISOPROLOL FUMARATE 5 MG PO TABS
5.0000 mg | ORAL_TABLET | Freq: Two times a day (BID) | ORAL | Status: DC
  Administered 2013-11-11 – 2013-11-13 (×4): 5 mg via ORAL
  Filled 2013-11-11 (×7): qty 1

## 2013-11-11 MED ORDER — HYDRALAZINE HCL 25 MG PO TABS
25.0000 mg | ORAL_TABLET | Freq: Three times a day (TID) | ORAL | Status: DC
  Administered 2013-11-11 – 2013-11-13 (×5): 25 mg via ORAL
  Filled 2013-11-11 (×9): qty 1

## 2013-11-11 NOTE — Progress Notes (Signed)
Patient ID: Lonnie [name], male   DOB: [birth year], 68 y.o.   MRN: [id number]    SUBJECTIVE: No further VT.  He is more oriented this morning.  Speech is clearer.  No dyspnea/chest pain.  Creatinine coming down again.  BP soft and HR low with occasional lightheadedness with standing.  Scheduled Meds: . amiodarone  400 mg Oral BID  . aspirin  81 mg Per Tube Daily  . atorvastatin  80 mg Per Tube q1800  . bisoprolol  5 mg Oral BID  . docusate sodium  100 mg Oral BID  . heparin  5,000 Units Subcutaneous 3 times per day  . hydrALAZINE  25 mg Oral 3 times per day  . isosorbide mononitrate  30 mg Oral Daily  . pantoprazole  40 mg Oral Daily  . spironolactone  25 mg Oral Daily   Continuous Infusions: . sodium chloride Stopped (11/09/13 0916)   PRN Meds:.sodium chloride, bisacodyl, docusate sodium, hydrALAZINE, nitroGLYCERIN    Filed Vitals:   11/10/13 1100 11/10/13 1223 11/10/13 2026 11/11/13 0530  BP: 107/62 104/58 99/52 106/66  Pulse:  53 51 56  Temp:  98.3 F (36.8 C) 98.2 F (36.8 C) 98.8 F (37.1 C)  TempSrc:  Oral Oral Oral  Resp: 15 18 16 17   Height:      Weight:    89.812 kg (198 lb)  SpO2:  96% 94% 94%   No intake or output data in the 24 hours ending 11/11/13 0833  LABS: Basic Metabolic Panel:  Recent Labs  40/98/11 0630 11/10/13 0825 11/11/13 0339  NA 143  --  141  K 3.7  --  3.9  CL 98  --  98  CO2 28  --  27  GLUCOSE 88  --  89  BUN 50*  --  55*  CREATININE 2.26*  --  2.06*  CALCIUM 9.2  --  9.6  MG  --  2.2  --    Liver Function Tests: No results found for this basename: AST, ALT, ALKPHOS, BILITOT, PROT, ALBUMIN,  in the last 72 hours No results found for this basename: LIPASE, AMYLASE,  in the last 72 hours CBC:  Recent Labs  11/09/13 0500 11/10/13 0630  WBC 11.4* 11.5*  HGB 14.5 14.7  HCT 41.8 42.1  MCV 90.1 89.4  PLT 193 203   Cardiac Enzymes:  Recent Labs  11/10/13 0145 11/10/13 0825  CKTOTAL 126  --   CKMB 2.1  --     TROPONINI <0.30 <0.30   BNP: No components found with this basename: POCBNP,  D-Dimer: No results found for this basename: DDIMER,  in the last 72 hours Hemoglobin A1C: No results found for this basename: HGBA1C,  in the last 72 hours Fasting Lipid Panel: No results found for this basename: CHOL, HDL, LDLCALC, TRIG, CHOLHDL, LDLDIRECT,  in the last 72 hours Thyroid Function Tests: No results found for this basename: TSH, T4TOTAL, FREET3, T3FREE, THYROIDAB,  in the last 72 hours Anemia Panel: No results found for this basename: VITAMINB12, FOLATE, FERRITIN, TIBC, IRON, RETICCTPCT,  in the last 72 hours  RADIOLOGY: Ct Head Wo Contrast  11/01/2013   CLINICAL DATA:  68 year old male with altered mental status.  EXAM: CT HEAD WITHOUT CONTRAST  TECHNIQUE: Contiguous axial images were obtained from the base of the skull through the vertex without intravenous contrast.  COMPARISON:  03/14/2011  FINDINGS: A left occipital infarct appears subacute to remote.  No acute intracranial abnormalities are identified, including mass lesion  or mass effect, hydrocephalus, extra-axial fluid collection, midline shift, hemorrhage, or acute infarction. The visualized bony calvarium is unremarkable.  IMPRESSION: No evidence of acute intracranial abnormality.  Left occipital infarct which appears subacute-remote.   Electronically Signed   By: Laveda Abbe M.D.   On: 11/01/2013 18:26   Dg Chest Port 1 View  11/06/2013   CLINICAL DATA:  Bilateral infiltrates, left lower lobe atelectasis.  EXAM: PORTABLE CHEST - 1 VIEW  COMPARISON:  DG CHEST 1V PORT dated 11/05/2013; DG CHEST 1V PORT dated 11/01/2013; DG CHEST 2 VIEW dated 03/17/2013; DG CHEST 1V PORT dated 02/12/2013  FINDINGS: Grossly unchanged enlarged cardiac silhouette and mediastinal contours post median sternotomy, CABG and aortic valve replacement. Interval extubation and removal of enteric tube. Otherwise, stable positioning of left jugular approach central venous catheter  tip projects over the superior SVC. No pneumothorax. Minimally improved aeration along. Persistent heterogeneous opacities about the left hilum. Minimally improved aeration left lower lung with persistent retrocardiac opacities. No new focal airspace opacities. No definite pleural effusion. Surgical clips overlie the caudal aspect of the right thoracic inlet as well as expected location of the gastroesophageal junction. Grossly unchanged bones.  IMPRESSION: 1. Interval extubation and removal of enteric tube. Otherwise, stable position of remaining support apparatus. No pneumothorax. 2. Overall improved aeration along suggests resolving edema and atelectasis. 3. Similar-appearing left perihilar and medial basilar opacities, with differential considerations including residual asymmetric pulmonary edema, atelectasis versus infiltrate. Continued attention on follow-up is recommended.   Electronically Signed   By: Simonne Come M.D.   On: 11/06/2013 07:32   Dg Chest Port 1 View  11/05/2013   CLINICAL DATA:  Evaluate endotracheal tube. Evaluate pulmonary edema.  EXAM: PORTABLE CHEST - 1 VIEW  COMPARISON:  DG CHEST 1V PORT dated 11/04/2013  FINDINGS: Endotracheal tube is appropriately positioned, terminating 3.7 cm above carina. Mitral valve repair. Nasogastric tube is poorly visualized distally, but likely extends beyond the inferior aspect of the film. Left internal jugular line terminates at the mid SVC.  Cardiomegaly accentuated by AP portable technique. Right hemidiaphragm elevation. No pleural effusion or pneumothorax. Inferior right upper lobe airspace disease is unchanged. Worsened left base aeration with developing consolidation. Mild interstitial edema is similar.  IMPRESSION: Worsened left base aeration, suspicious for atelectasis or infection.  Mild interstitial edema with similar right upper lobe airspace disease, favored to represent atelectasis.   Electronically Signed   By: Jeronimo Greaves M.D.   On:  11/05/2013 07:40   PHYSICAL EXAM General: NAD Neck: JVP 7-8 cm, no thyromegaly or thyroid nodule.  Lungs: Crackles at bases bilaterally.  CV: Nondisplaced PMI.  Heart regular S1/S2, 1/6 SEM.  No edema.  No carotid bruit.  Abdomen: Soft, nontender, no hepatosplenomegaly, no distention.  Neurologic: alert, oriented to place Psych: Normal affect. Extremities: No clubbing or cyanosis.   TELEMETRY: Reviewed telemetry pt in NSR  ASSESSMENT AND PLAN: 68 yo with history of CAD s/p CABG, ischemic CMP, CKD, and bioprosthetic MVR now s/p cardiac arrest. 1. Cardiac arrest: Suspect ventricular fibrillation, scar-related. Status post hypothermia, now extubated.  He had recurrent VT 2/28 am requiring DCCV.  He is back on amiodarone, now po. He will need ICD eventually => EP has seen (not CRT candidate with narrow QRS).  Plan currently for discharge with [facility], assess neurologic recovery, and if good recovery plan ICD implantation.  2. CAD: s/p CABG. Grafts patent on LHC this admission, suspect cardiac arrest/VT were scar-related rather than ischemia related. On ASA 81 and  statin.   3. Acute on chronic systolic CHF: EF 84%15% with RV dysfunction. Co-ox has been good. He has diuresed well.  Creatinine rose so Lasix held.  BP soft.  - Hold Lasix again today.   - With soft BP, will cut hydralazine and bisoprolol in half today (hold off on ACEI with AKI).  - Continue spironolactone.  4. AKI on CKD: Creatinine coming down. Continue to hold Lasix and promote higher BP by cutting meds as above.  5. PT/OT => [facility] [place], tomorrow if creatinine remains stable.  6. Hypernatremia: Much improved.  7. Neuro: There is some anoxic encephalopathy.  Still some confusion.  Will continue to follow.  Marca AnconaDalton Abdalrahman Clementson 11/11/2013 8:33 AM

## 2013-11-11 NOTE — Progress Notes (Signed)
Speech Language Pathology Treatment: Dysphagia  Patient Details Name: Lonnie BerkshireRobert L [name] MRN: [id number] DOB: 1945-11-06 Today's Date: 11/11/2013 Time: [id number] SLP Time Calculation (min): 10 min  Assessment / Plan / Recommendation Clinical Impression  Pt continues with dysphagia related primarily to anoxic encephalopathy: inattention leads to cessation of chewing, with moderate verbal cues needed to resume mastication.  Poor working memory and Research officer, trade unionproblem-solving impact safety with even the most basic tasks like eating.   Pt will require full supervision at SNF to ensure safety with POs.     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 Intubated from 2/22 to 2/26.       SLP Plan  Continue with current plan of care    Recommendations Diet recommendations: Dysphagia 3 (mechanical soft);Thin liquid Liquids provided via: Straw;Cup Medication Administration: Whole meds with liquid Supervision: Staff to assist with self feeding Compensations: Slow rate;Small sips/bites Postural Changes and/or Swallow Maneuvers: Seated upright 90 degrees              Oral Care Recommendations: Oral care BID Follow up Recommendations: Skilled Nursing facility Plan: Continue with current plan of care   Dudley Cooley L. Samson Fredericouture, [place] CCC/SLP Pager [phone]      Blenda MountsCouture, Marcello Tuzzolino Laurice 11/11/2013, 2:17 PM

## 2013-11-11 NOTE — Progress Notes (Signed)
CSW updated facility on approx dc date, possibly tomorrow. Facility states they are ready for patient whenever and already have insurance approval.  Maree KrabbeLindsay Wonda Goodgame, MSW, Theresia MajorsLCSWA [phone]

## 2013-11-11 NOTE — Progress Notes (Signed)
Physical Therapy Treatment Patient Details Name: Lonnie BerkshireRobert L [name] MRN: [id number] DOB: 1946/05/02 Today's Date: 11/11/2013 Time: [id number] PT Time Calculation (min): 23 min  PT Assessment / Plan / Recommendation  History of Present Illness Pt admit with cardiac arrest.  Anoxic encephalopathy and VDRF.     PT Comments   Continues to be unsteady with gait and challenges to balance.  Slow to process and hold more complex conversation.  Follow Up Recommendations  SNF     Does the patient have the potential to tolerate intense rehabilitation     Barriers to Discharge        Equipment Recommendations       Recommendations for Other Services    Frequency Min 3X/week   Progress towards PT Goals Progress towards PT goals: Progressing toward goals  Plan Current plan remains appropriate    Precautions / Restrictions Precautions Precautions: Fall   Pertinent Vitals/Pain 95% on RA,  HR upper 50's 60's    Mobility  Bed Mobility Bed Mobility: Supine to Sit;Sit to Supine Supine to sit: Supervision Sit to supine: Supervision Transfers Overall transfer level: Needs assistance Transfers: Sit to/from Stand Sit to Stand: Supervision General transfer comment: supervision for unsteadiness Ambulation/Gait Ambulation/Gait assistance: Min guard Ambulation Distance (Feet): 350 Feet Gait Pattern/deviations: Step-through pattern;Scissoring;Drifts right/left;Narrow base of support Gait velocity: variable, but slower Gait velocity interpretation: Below normal speed for age/gender Stairs: Yes Stairs assistance: Min guard Stair Management: One rail Right;Alternating pattern;Forwards Number of Stairs: 6 General stair comments: Definite need for rail    Exercises     PT Diagnosis:    PT Problem List:   PT Treatment Interventions:     PT Goals (current goals can now be found in the care plan section) Acute Rehab PT Goals Patient Stated Goal: to get better PT Goal Formulation: With  patient Time For Goal Achievement: 11/13/13 Potential to Achieve Goals: Good  Visit Information  Last PT Received On: 11/11/13 Assistance Needed: +1 History of Present Illness: Pt admit with cardiac arrest.  Anoxic encephalopathy and VDRF.      Subjective Data  Subjective: I'm a little off Patient Stated Goal: to get better   Cognition  Cognition Arousal/Alertness: Awake/alert Behavior During Therapy: WFL for tasks assessed/performed Overall Cognitive Status: Impaired/Different from baseline Problem Solving: Slow processing    Balance  Balance Sitting balance-Leahy Scale: Good Standing balance support: No upper extremity supported;During functional activity Standing balance-Leahy Scale: Fair Standardized Balance Assessment Standardized Balance Assessment : Dynamic Gait Index General Comments General comments (skin integrity, edema, etc.): Higher level balance activity including cadence changes, head turns abrupt direction changes, stepping over objects, backing up and scanning all produced increased instability.  Did not did formal DGI with information due to skipped activities, but subjective risk for falls.  End of Session PT - End of Session Activity Tolerance: Patient tolerated treatment well Patient left: in chair;with call bell/phone within reach;with family/visitor present Nurse Communication: Mobility status;Precautions   GP     Lonnie [name], Eliseo GumKenneth V 11/11/2013, 5:35 PM 11/11/2013  [place] BingKen Tameca Jerez, PT [phone]  (pager)

## 2013-11-11 NOTE — Progress Notes (Signed)
CARDIAC REHAB PHASE I   PRE:  Rate/Rhythm: 56 SB  BP:  Supine: 111/60  Sitting: 100/52  Standing:    SaO2: 93 RA  MODE:  Ambulation: 700 ft   POST:  Rate/Rhythm: 60  BP:  Supine:   Sitting: 105/64  Standing:    SaO2: 98 RA 1020-1045 On arrival pt sleeping, aroused easily. Assisted X 1 used walker and gait belt to ambulate. Pt's gait more steady today and he did not do the high stepping like he did previously. He still get distracted walking looking in pt's rooms. He did stay inside of walker better today. Pt to recliner after walk with call light in reach. He continues to be oriented to person only. I reoriented him while we walked.   [name] RN 11/11/2013 11:07 AM

## 2013-11-12 DIAGNOSIS — I255 Ischemic cardiomyopathy: Secondary | ICD-10-CM | POA: Diagnosis present

## 2013-11-12 DIAGNOSIS — I2510 Atherosclerotic heart disease of native coronary artery without angina pectoris: Secondary | ICD-10-CM | POA: Diagnosis present

## 2013-11-12 DIAGNOSIS — G931 Anoxic brain damage, not elsewhere classified: Secondary | ICD-10-CM | POA: Diagnosis present

## 2013-11-12 LAB — BASIC METABOLIC PANEL
BUN: 54 mg/dL — AB (ref 6–23)
CALCIUM: 9.3 mg/dL (ref 8.4–10.5)
CO2: 25 mEq/L (ref 19–32)
Chloride: 99 mEq/L (ref 96–112)
Creatinine, Ser: 2.04 mg/dL — ABNORMAL HIGH (ref 0.50–1.35)
GFR calc Af Amer: 37 mL/min — ABNORMAL LOW (ref >90)
GFR calc non Af Amer: 32 mL/min — ABNORMAL LOW (ref >90)
GLUCOSE: 84 mg/dL (ref 70–99)
POTASSIUM: 4.1 meq/L (ref 3.7–5.3)
Sodium: 139 mEq/L (ref 137–147)

## 2013-11-12 MED ORDER — PANTOPRAZOLE SODIUM 40 MG PO TBEC
40.0000 mg | DELAYED_RELEASE_TABLET | Freq: Every day | ORAL | Status: DC
Start: 2013-11-12 — End: 2013-11-27

## 2013-11-12 MED ORDER — FUROSEMIDE 40 MG PO TABS: 40.0000 mg | ORAL_TABLET | Freq: Every day | ORAL | Status: DC

## 2013-11-12 MED ORDER — AMIODARONE HCL 200 MG PO TABS: 200.0000 mg | ORAL_TABLET | Freq: Two times a day (BID) | ORAL | Status: DC

## 2013-11-12 MED ORDER — ATORVASTATIN CALCIUM 80 MG PO TABS: 80.0000 mg | ORAL_TABLET | Freq: Every day | ORAL | Status: DC

## 2013-11-12 MED ORDER — BISOPROLOL FUMARATE 5 MG PO TABS: 5.0000 mg | ORAL_TABLET | Freq: Two times a day (BID) | ORAL | Status: DC

## 2013-11-12 MED ORDER — ISOSORBIDE MONONITRATE ER 30 MG PO TB24
30.0000 mg | ORAL_TABLET | Freq: Every day | ORAL | Status: DC
Start: 2013-11-12 — End: 2013-12-04

## 2013-11-12 MED ORDER — NITROGLYCERIN 0.4 MG SL SUBL: 0.4000 mg | SUBLINGUAL_TABLET | SUBLINGUAL | Status: DC | PRN

## 2013-11-12 MED ORDER — SPIRONOLACTONE 25 MG PO TABS: 25.0000 mg | ORAL_TABLET | Freq: Every day | ORAL | Status: DC

## 2013-11-12 MED ORDER — HYDRALAZINE HCL 25 MG PO TABS: 25.0000 mg | ORAL_TABLET | Freq: Three times a day (TID) | ORAL | Status: DC

## 2013-11-12 NOTE — Progress Notes (Signed)
Patient ID: Lonnie BerkshireRobert L [name], male   DOB: 10-02-1945, 68 y.o.   MRN: [id number]    SUBJECTIVE: No further VT.  No chest pain or dyspnea.  BP stable.  Mentally clearer, speech improved.   Scheduled Meds: . amiodarone  400 mg Oral BID  . aspirin  81 mg Per Tube Daily  . atorvastatin  80 mg Per Tube q1800  . bisoprolol  5 mg Oral BID  . docusate sodium  100 mg Oral BID  . heparin  5,000 Units Subcutaneous 3 times per day  . hydrALAZINE  25 mg Oral 3 times per day  . isosorbide mononitrate  30 mg Oral Daily  . pantoprazole  40 mg Oral Daily  . spironolactone  25 mg Oral Daily   Continuous Infusions: . sodium chloride Stopped (11/09/13 0916)   PRN Meds:.sodium chloride, bisacodyl, docusate sodium, hydrALAZINE, nitroGLYCERIN    Filed Vitals:   11/11/13 2018 11/11/13 2125 11/12/13 0350 11/12/13 0625  BP: 101/61 105/66 108/56 118/52  Pulse: 60 54 56   Temp: 98.7 F (37.1 C)  98.1 F (36.7 C)   TempSrc: Oral  Oral   Resp: 20  20   Height:      Weight:      SpO2: 95%  98%     Intake/Output Summary (Last 24 hours) at 11/12/13 0926 Last data filed at 11/12/13 0700  Gross per 24 hour  Intake    460 ml  Output    800 ml  Net   -340 ml    LABS: Basic Metabolic Panel:  Recent Labs  40/98/1101/11/22 0825 11/11/13 0339 11/12/13 0245  NA  --  141 139  K  --  3.9 4.1  CL  --  98 99  CO2  --  27 25  GLUCOSE  --  89 84  BUN  --  55* 54*  CREATININE  --  2.06* 2.04*  CALCIUM  --  9.6 9.3  MG 2.2  --   --    Liver Function Tests: No results found for this basename: AST, ALT, ALKPHOS, BILITOT, PROT, ALBUMIN,  in the last 72 hours No results found for this basename: LIPASE, AMYLASE,  in the last 72 hours CBC:  Recent Labs  11/10/13 0630  WBC 11.5*  HGB 14.7  HCT 42.1  MCV 89.4  PLT 203   Cardiac Enzymes:  Recent Labs  11/10/13 0145 11/10/13 0825  CKTOTAL 126  --   CKMB 2.1  --   TROPONINI <0.30 <0.30   BNP: No components found with this basename: POCBNP,    D-Dimer: No results found for this basename: DDIMER,  in the last 72 hours Hemoglobin A1C: No results found for this basename: HGBA1C,  in the last 72 hours Fasting Lipid Panel: No results found for this basename: CHOL, HDL, LDLCALC, TRIG, CHOLHDL, LDLDIRECT,  in the last 72 hours Thyroid Function Tests: No results found for this basename: TSH, T4TOTAL, FREET3, T3FREE, THYROIDAB,  in the last 72 hours Anemia Panel: No results found for this basename: VITAMINB12, FOLATE, FERRITIN, TIBC, IRON, RETICCTPCT,  in the last 72 hours  RADIOLOGY: Ct Head Wo Contrast  11/01/2013   CLINICAL DATA:  68 year old male with altered mental status.  EXAM: CT HEAD WITHOUT CONTRAST  TECHNIQUE: Contiguous axial images were obtained from the base of the skull through the vertex without intravenous contrast.  COMPARISON:  03/14/2011  FINDINGS: A left occipital infarct appears subacute to remote.  No acute intracranial abnormalities are identified, including mass  lesion or mass effect, hydrocephalus, extra-axial fluid collection, midline shift, hemorrhage, or acute infarction. The visualized bony calvarium is unremarkable.  IMPRESSION: No evidence of acute intracranial abnormality.  Left occipital infarct which appears subacute-remote.   Electronically Signed   By: Laveda Abbe M.D.   On: 11/01/2013 18:26   Dg Chest Port 1 View  11/06/2013   CLINICAL DATA:  Bilateral infiltrates, left lower lobe atelectasis.  EXAM: PORTABLE CHEST - 1 VIEW  COMPARISON:  DG CHEST 1V PORT dated 11/05/2013; DG CHEST 1V PORT dated 11/01/2013; DG CHEST 2 VIEW dated 03/17/2013; DG CHEST 1V PORT dated 02/12/2013  FINDINGS: Grossly unchanged enlarged cardiac silhouette and mediastinal contours post median sternotomy, CABG and aortic valve replacement. Interval extubation and removal of enteric tube. Otherwise, stable positioning of left jugular approach central venous catheter tip projects over the superior SVC. No pneumothorax. Minimally improved aeration  along. Persistent heterogeneous opacities about the left hilum. Minimally improved aeration left lower lung with persistent retrocardiac opacities. No new focal airspace opacities. No definite pleural effusion. Surgical clips overlie the caudal aspect of the right thoracic inlet as well as expected location of the gastroesophageal junction. Grossly unchanged bones.  IMPRESSION: 1. Interval extubation and removal of enteric tube. Otherwise, stable position of remaining support apparatus. No pneumothorax. 2. Overall improved aeration along suggests resolving edema and atelectasis. 3. Similar-appearing left perihilar and medial basilar opacities, with differential considerations including residual asymmetric pulmonary edema, atelectasis versus infiltrate. Continued attention on follow-up is recommended.   Electronically Signed   By: Simonne Come M.D.   On: 11/06/2013 07:32   Dg Chest Port 1 View  11/05/2013   CLINICAL DATA:  Evaluate endotracheal tube. Evaluate pulmonary edema.  EXAM: PORTABLE CHEST - 1 VIEW  COMPARISON:  DG CHEST 1V PORT dated 11/04/2013  FINDINGS: Endotracheal tube is appropriately positioned, terminating 3.7 cm above carina. Mitral valve repair. Nasogastric tube is poorly visualized distally, but likely extends beyond the inferior aspect of the film. Left internal jugular line terminates at the mid SVC.  Cardiomegaly accentuated by AP portable technique. Right hemidiaphragm elevation. No pleural effusion or pneumothorax. Inferior right upper lobe airspace disease is unchanged. Worsened left base aeration with developing consolidation. Mild interstitial edema is similar.  IMPRESSION: Worsened left base aeration, suspicious for atelectasis or infection.  Mild interstitial edema with similar right upper lobe airspace disease, favored to represent atelectasis.   Electronically Signed   By: Jeronimo Greaves M.D.   On: 11/05/2013 07:40   PHYSICAL EXAM General: NAD Neck: JVP 7 cm, no thyromegaly or  thyroid nodule.  Lungs: Crackles at bases bilaterally.  CV: Nondisplaced PMI.  Heart regular S1/S2, 1/6 SEM.  No edema.  No carotid bruit.  Abdomen: Soft, nontender, no hepatosplenomegaly, no distention.  Neurologic: alert, oriented to place Psych: Normal affect. Extremities: No clubbing or cyanosis.   TELEMETRY: Reviewed telemetry pt in NSR  ASSESSMENT AND PLAN: 68 yo with history of CAD s/p CABG, ischemic CMP, CKD, and bioprosthetic MVR now s/p cardiac arrest. 1. Cardiac arrest: Suspect ventricular fibrillation, scar-related. Status post hypothermia, now extubated.  He had recurrent VT 2/28 am requiring DCCV.  He is back on amiodarone, now po. He will need ICD eventually => EP has seen (not CRT candidate with narrow QRS).  Plan for discharge today to [facility] with [facility], followup with EP for ICD as mental status clears.  2. CAD: s/p CABG. Grafts patent on LHC this admission, suspect cardiac arrest/VT were scar-related rather than ischemia related. On  ASA 81 and statin.   3. Acute on chronic systolic CHF: EF 16% with RV dysfunction. Co-ox has been good. He has diuresed well.  Creatinine rose so Lasix held.  - Restart po Lasix tomorrow.   - Continue bisoprolol, spironolactone, hydral/Imdur.  4. AKI on CKD: Stable creatinine, restart Lasix tomorrow.  5. PT/OT => plan for [facility] in [place].   6. Neuro: There is some anoxic encephalopathy.  Still some confusion.  Continue to follow. 7. Discharge pain: Followup CHF clinic 1 week.  Followup EP ([facility]) 2 wks.  Discharge to [facility] with [facility].  Lasix 40 mg po daily (to start tomorrow), amiodarone 200 mg bid, ASA 81, atorvastatin 80, bisoprolol 5 bid, hydralazine 25 tid, Imdur 30 daily, spironolactone 25 daily.  Needs BMET with followup.   Marca Ancona 11/12/2013 9:26 AM

## 2013-11-12 NOTE — Progress Notes (Signed)
Clinical Social Work Department CLINICAL SOCIAL WORK PLACEMENT NOTE 11/12/2013  Patient:  Lonnie BerkshireMARTIN,[name]  Account Number:  [id number] Admit date:  11/01/2013  Clinical Social Worker:  Maryclare LabradorJULIE ANDERSON, Theresia MajorsLCSWA  Date/time:  11/09/2013 03:04 PM  Clinical Social Work is seeking post-discharge placement for this patient at the following level of care:   SKILLED NURSING   (*CSW will update this form in Epic as items are completed)     Patient/family provided with [facility] Department of Clinical Social Work's list of facilities offering this level of care within the geographic area requested by the patient (or if unable, by the patient's family).  11/09/2013  Patient/family informed of their freedom to choose among providers that offer the needed level of care, that participate in Medicare, Medicaid or managed care program needed by the patient, have an available bed and are willing to accept the patient.    Patient/family informed of [facility]' ownership interest in [facility], as well as of the fact that they are under no obligation to receive care at this facility.  PASARR submitted to EDS on 11/09/2013 PASARR number received from EDS on 11/09/2013  FL2 transmitted to all facilities in geographic area requested by pt/family on  11/09/2013 FL2 transmitted to all facilities within larger geographic area on   Patient informed that his/her managed care company has contracts with or will negotiate with  certain facilities, including the following:     Patient/family informed of bed offers received:  11/09/2013 [facility] Physician recommends and patient chooses bed at    [facility] on  11/13/2013 Patient to be transferred to facility by EMS  The following physician request were entered in Epic:   Additional Comments:  Maree KrabbeLindsay Dorothymae Maciver, MSW, Theresia MajorsLCSWA [phone]

## 2013-11-12 NOTE — Progress Notes (Signed)
CSW spoke to facility about patient coming late tonight after fitting of life vest. Facility states that tomorrow morning would be better because it will be too late for admissions. CSW will update patient and patient's wife of this.  Maree KrabbeLindsay Shadie Sweatman, MSW, Theresia MajorsLCSWA [phone]

## 2013-11-12 NOTE — Discharge Summary (Signed)
Patient ID: Lonnie [name],  MRN: [id number], DOB/AGE: 04/02/1946 68 y.o.  Admit date: 11/01/2013 Discharge date: 11/12/2013  Primary Care Provider: Leonides Grills, MD Primary Cardiologist: Dr Donneta Romberg Dr Allred  Discharge Diagnoses Principal Problem:   Cardiac arrest- out of hospital cardiac arrest, VT/VF 11/01/13 Active Problems:   Ventricular tachycardia- recurrent   AKI (acute kidney injury)   Acute respiratory failure- extubated 11/04/13   CAD - CABG x 3 5/14- patent grafts 11/01/13   Cardiomyopathy, ischemic- EF down to 15% by echo 11/02/13   Hypoxic encephalopathy - post arrest   Essential hypertension, benign   High cholesterol   History of MVR (tissue valve) 5/14    Procedures: Coronary angiogram 11/01/13   Hospital Course: This 68 year old male has a history of coronary artery disease with a late presentation of an inferior myocardial infarction in May of 2014. He was found to have a partially flail posterior leaflet of the mitral valve with severe mitral regurgitation. He underwent CABG x 3 and MV replacement. He was estimated to have fairly normal LV systolic function initially but postoperatively developed ventricular tachycardia requiring amiodarone. His ejection fraction was around 30-35% following surgery. He required amiodarone for some time in the hospital and was sent home on low-dose of beta blocker and low-dose diuretic and was not discharged on amiodarone. He had difficulty tolerating Corag and was later placed on bisoprolol. An echocardiogram done in December showed an ejection fraction 30-35% and when seen by his cardiologist a note was made that he would prefer to try further med titration was rather than getting an AICD.       He was found unresponsive, sitting in a chair by his wife 11/01/13. EMS shocked him in the field for VT/VF. He was intubated and transported to [facility]. He was taken urgently to the cath lab by Dr Martinique. Cath revealed patent grafts including  LIMA to the LAD, SVG to the diagonal, and SVG to the PDA. He had severe LV dysfunction. It was determined there was no culprit lesion to explain his cardiac arrest. There is moderate obstructive disease in the diagonal and second OM but it was felt his arrest was predominantly related to arrhythmia and pump failure. He was admitted to CCU, intubated, on hypothermia protocol, on IV Amiodarone. Echo 11/02/13 showed an EF of 15%. He was treated for possible aspiration pneumonia. He had transient thrombocytopenia but this improved. He did have acute renal injury with a peak SCr of 2.26. He was extubated 11/04/13. On 11/07/13 he had recurrent VT. Amiodarone was resumed and he was cardioverted to NSR. He does have post arrest anoxic encephalopathy. He was seen by the EP service and initially the plan was for an ICD prior to discharge but his mental status is still impaired and it was felt best to discharge him to rehab on a Life Vest and Amiodarone. He will need close follow up in the CHF and EP clinics.       Discharge Vitals:  Blood pressure 118/52, pulse 56, temperature 98.1 F (36.7 C), temperature source Oral, resp. rate 20, height 6' 1"  (1.854 m), weight 198 lb (89.812 kg), SpO2 98.00%.    Labs: Results for orders placed during the hospital encounter of 11/01/13 (from the past 48 hour(s))  BASIC METABOLIC PANEL     Status: Abnormal   Collection Time    11/11/13  3:39 AM      Result Value Ref Range   Sodium 141  137 - 147  mEq/L   Potassium 3.9  3.7 - 5.3 mEq/L   Chloride 98  96 - 112 mEq/L   CO2 27  19 - 32 mEq/L   Glucose, Bld 89  70 - 99 mg/dL   BUN 55 (*) 6 - 23 mg/dL   Creatinine, Ser 2.06 (*) 0.50 - 1.35 mg/dL   Calcium 9.6  8.4 - 10.5 mg/dL   GFR calc non Af Amer 31 (*) >90 mL/min   GFR calc Af Amer 36 (*) >90 mL/min   Comment: (NOTE)     The eGFR has been calculated using the CKD EPI equation.     This calculation has not been validated in all clinical situations.     eGFR's  persistently <90 mL/min signify possible Chronic Kidney     Disease.  BASIC METABOLIC PANEL     Status: Abnormal   Collection Time    11/12/13  2:45 AM      Result Value Ref Range   Sodium 139  137 - 147 mEq/L   Potassium 4.1  3.7 - 5.3 mEq/L   Chloride 99  96 - 112 mEq/L   CO2 25  19 - 32 mEq/L   Glucose, Bld 84  70 - 99 mg/dL   BUN 54 (*) 6 - 23 mg/dL   Creatinine, Ser 2.04 (*) 0.50 - 1.35 mg/dL   Calcium 9.3  8.4 - 10.5 mg/dL   GFR calc non Af Amer 32 (*) >90 mL/min   GFR calc Af Amer 37 (*) >90 mL/min   Comment: (NOTE)     The eGFR has been calculated using the CKD EPI equation.     This calculation has not been validated in all clinical situations.     eGFR's persistently <90 mL/min signify possible Chronic Kidney     Disease.    Disposition:      Follow-up Information   Follow up with [facility] On 11/19/2013. (12:40 pm)    Contact information:   [address] [place] [address]       Follow up with Thompson Grayer, MD On 11/30/2013. (4 pm)    Specialty:  Cardiology   Contact information:   [address] [place] [address] [phone]       Discharge Medications:    Medication List    STOP taking these medications       lisinopril 2.5 MG tablet  Commonly known as:  PRINIVIL,ZESTRIL     ranitidine 150 MG tablet  Commonly known as:  ZANTAC     simvastatin 10 MG tablet  Commonly known as:  ZOCOR      TAKE these medications       amiodarone 200 MG tablet  Commonly known as:  PACERONE  Take 1 tablet (200 mg total) by mouth 2 (two) times daily.     aspirin 81 MG EC tablet  Take 1 tablet (81 mg total) by mouth daily.     atorvastatin 80 MG tablet  Commonly known as:  LIPITOR  Place 1 tablet (80 mg total) into feeding tube daily at 6 PM.     bisoprolol 5 MG tablet  Commonly known as:  ZEBETA  Take 1 tablet (5 mg total) by mouth 2 (two) times daily.     EXCEDRIN MIGRAINE PO  Take by mouth as needed.      furosemide 40 MG tablet  Commonly known as:  LASIX  Take 1 tablet (40 mg total) by  mouth daily.  Start taking on:  11/13/2013     hydrALAZINE 25 MG tablet  Commonly known as:  APRESOLINE  Take 1 tablet (25 mg total) by mouth every 8 (eight) hours.     isosorbide mononitrate 30 MG 24 hr tablet  Commonly known as:  IMDUR  Take 1 tablet (30 mg total) by mouth daily.     nitroGLYCERIN 0.4 MG SL tablet  Commonly known as:  NITROSTAT  Place 1 tablet (0.4 mg total) under the tongue every 5 (five) minutes as needed for chest pain.     pantoprazole 40 MG tablet  Commonly known as:  PROTONIX  Take 1 tablet (40 mg total) by mouth daily.     spironolactone 25 MG tablet  Commonly known as:  ALDACTONE  Take 1 tablet (25 mg total) by mouth daily.         Duration of Discharge Encounter: Greater than 30 minutes including physician time.  Angelena Form PA-C 11/12/2013 10:34 AM

## 2013-11-12 NOTE — Progress Notes (Signed)
CARDIAC REHAB PHASE I   PRE:  Rate/Rhythm: 53 SB  BP:  Supine:   Sitting: 88/57  Standing:    SaO2: 95 RA  MODE:  Ambulation: 790 ft   POST:  Rate/Rhythm: 57  BP:  Supine:   Sitting: 102/58  Standing:    SaO2: 98 RA 1355-1445 Assisted X 1 and used walker to ambulate. Gait steady with walker. Pt able to walk 790 feet without c/o. His gait is more steady. Pt still has some confusion. Completed CHF education with pt and wife. With pt's confusion did education mostly with wife. I gave pt's wife CHF packet. They have watched life vest, ICD and now watching CHF videos. I encouraged daily weights. Pt back to recliner after walk with call in reach and wife present.  Melina CopaLisa Marytza Grandpre RN 11/12/2013 2:47 PM

## 2013-11-13 LAB — BASIC METABOLIC PANEL
BUN: 43 mg/dL — ABNORMAL HIGH (ref 6–23)
CO2: 22 meq/L (ref 19–32)
Calcium: 9.3 mg/dL (ref 8.4–10.5)
Chloride: 99 mEq/L (ref 96–112)
Creatinine, Ser: 1.83 mg/dL — ABNORMAL HIGH (ref 0.50–1.35)
GFR calc non Af Amer: 36 mL/min — ABNORMAL LOW (ref >90)
GFR, EST AFRICAN AMERICAN: 42 mL/min — AB (ref >90)
Glucose, Bld: 99 mg/dL (ref 70–99)
POTASSIUM: 4.8 meq/L (ref 3.7–5.3)
SODIUM: 135 meq/L — AB (ref 137–147)

## 2013-11-13 MED ORDER — FUROSEMIDE 40 MG PO TABS
40.0000 mg | ORAL_TABLET | Freq: Every day | ORAL | Status: DC
  Administered 2013-11-13: 40 mg via ORAL
  Filled 2013-11-13: qty 1

## 2013-11-13 NOTE — Progress Notes (Signed)
PT Cancellation Note  Patient Details Name: Lonnie BerkshireRobert L [name] MRN: [id number] DOB: May 30, 1946   Cancelled Treatment:    Reason Eval/Treat Not Completed: Patient at procedure or test/unavailable.  Pt just beginning to work with cardiac rehab.  Pt scheduled to go to d/c to rehab facility this afternoon.   [name] LUBECK 11/13/2013, 11:39 AM

## 2013-11-13 NOTE — Progress Notes (Signed)
Clinical Social Worker facilitated patient discharge by contacting the patient, family and facility, [facility]. Patient agreeable to this plan and arranging transport via EMS . CSW will sign off, as social work intervention is no longer needed.  Maree KrabbeLindsay Shweta Aman, MSW, Theresia MajorsLCSWA [phone]

## 2013-11-13 NOTE — Progress Notes (Signed)
CARDIAC REHAB PHASE I   PRE:  Rate/Rhythm: 52 SB  BP:  Supine: 100/59  Sitting:   Standing:    SaO2: 96 RA  MODE:  Ambulation: 890 ft   POST:  Rate/Rhythm: 50  BP:  Supine:   Sitting: 106/58  Standing:    SaO2: 95 RA 1120-1145 Assisted X 1 and used walker to ambulate. Gait steady with walker. VS stable. Pt able to walk 890 feet without c/o. Pt more alert today.   Melina CopaLisa Shelva Hetzer RN 11/13/2013 12:03 PM

## 2013-11-13 NOTE — Progress Notes (Signed)
Patient ID: Lonnie [name], male   DOB: [birth year], 68 y.o.   MRN: [id number]   SUBJECTIVE: No further VT.  No chest pain or dyspnea.  BP stable.  Mentally clearer, speech improved. He leaves for rehab this am.   Scheduled Meds: . amiodarone  400 mg Oral BID  . aspirin  81 mg Per Tube Daily  . atorvastatin  80 mg Per Tube q1800  . bisoprolol  5 mg Oral BID  . docusate sodium  100 mg Oral BID  . furosemide  40 mg Oral Daily  . heparin  5,000 Units Subcutaneous 3 times per day  . hydrALAZINE  25 mg Oral 3 times per day  . isosorbide mononitrate  30 mg Oral Daily  . pantoprazole  40 mg Oral Daily  . spironolactone  25 mg Oral Daily   Continuous Infusions: . sodium chloride Stopped (11/09/13 0916)   PRN Meds:.sodium chloride, bisacodyl, docusate sodium, hydrALAZINE, nitroGLYCERIN    Filed Vitals:   11/12/13 0625 11/12/13 1423 11/12/13 2014 11/13/13 0500  BP: 118/52 94/81 100/57 143/80  Pulse:  53 58 56  Temp:  98.1 F (36.7 C) 97.8 F (36.6 C) 97.1 F (36.2 C)  TempSrc:  Oral Oral Oral  Resp:  20 19 18   Height:      Weight:      SpO2:  98% 98% 96%    Intake/Output Summary (Last 24 hours) at 11/13/13 0817 Last data filed at 11/13/13 0600  Gross per 24 hour  Intake    480 ml  Output    425 ml  Net     55 ml    LABS: Basic Metabolic Panel:  Recent Labs  40/98/11 0825 11/11/13 0339 11/12/13 0245  NA  --  141 139  K  --  3.9 4.1  CL  --  98 99  CO2  --  27 25  GLUCOSE  --  89 84  BUN  --  55* 54*  CREATININE  --  2.06* 2.04*  CALCIUM  --  9.6 9.3  MG 2.2  --   --    Liver Function Tests: No results found for this basename: AST, ALT, ALKPHOS, BILITOT, PROT, ALBUMIN,  in the last 72 hours No results found for this basename: LIPASE, AMYLASE,  in the last 72 hours CBC: No results found for this basename: WBC, NEUTROABS, HGB, HCT, MCV, PLT,  in the last 72 hours Cardiac Enzymes:  Recent Labs  11/10/13 0825  TROPONINI <0.30   BNP: No components found  with this basename: POCBNP,  D-Dimer: No results found for this basename: DDIMER,  in the last 72 hours Hemoglobin A1C: No results found for this basename: HGBA1C,  in the last 72 hours Fasting Lipid Panel: No results found for this basename: CHOL, HDL, LDLCALC, TRIG, CHOLHDL, LDLDIRECT,  in the last 72 hours Thyroid Function Tests: No results found for this basename: TSH, T4TOTAL, FREET3, T3FREE, THYROIDAB,  in the last 72 hours Anemia Panel: No results found for this basename: VITAMINB12, FOLATE, FERRITIN, TIBC, IRON, RETICCTPCT,  in the last 72 hours  RADIOLOGY: Ct Head Wo Contrast  11/01/2013   CLINICAL DATA:  68 year old male with altered mental status.  EXAM: CT HEAD WITHOUT CONTRAST  TECHNIQUE: Contiguous axial images were obtained from the base of the skull through the vertex without intravenous contrast.  COMPARISON:  03/14/2011  FINDINGS: A left occipital infarct appears subacute to remote.  No acute intracranial abnormalities are identified, including mass lesion or mass  effect, hydrocephalus, extra-axial fluid collection, midline shift, hemorrhage, or acute infarction. The visualized bony calvarium is unremarkable.  IMPRESSION: No evidence of acute intracranial abnormality.  Left occipital infarct which appears subacute-remote.   Electronically Signed   By: Laveda Abbe M.D.   On: 11/01/2013 18:26   Dg Chest Port 1 View  11/06/2013   CLINICAL DATA:  Bilateral infiltrates, left lower lobe atelectasis.  EXAM: PORTABLE CHEST - 1 VIEW  COMPARISON:  DG CHEST 1V PORT dated 11/05/2013; DG CHEST 1V PORT dated 11/01/2013; DG CHEST 2 VIEW dated 03/17/2013; DG CHEST 1V PORT dated 02/12/2013  FINDINGS: Grossly unchanged enlarged cardiac silhouette and mediastinal contours post median sternotomy, CABG and aortic valve replacement. Interval extubation and removal of enteric tube. Otherwise, stable positioning of left jugular approach central venous catheter tip projects over the superior SVC. No pneumothorax.  Minimally improved aeration along. Persistent heterogeneous opacities about the left hilum. Minimally improved aeration left lower lung with persistent retrocardiac opacities. No new focal airspace opacities. No definite pleural effusion. Surgical clips overlie the caudal aspect of the right thoracic inlet as well as expected location of the gastroesophageal junction. Grossly unchanged bones.  IMPRESSION: 1. Interval extubation and removal of enteric tube. Otherwise, stable position of remaining support apparatus. No pneumothorax. 2. Overall improved aeration along suggests resolving edema and atelectasis. 3. Similar-appearing left perihilar and medial basilar opacities, with differential considerations including residual asymmetric pulmonary edema, atelectasis versus infiltrate. Continued attention on follow-up is recommended.   Electronically Signed   By: Simonne Come M.D.   On: 11/06/2013 07:32   Dg Chest Port 1 View  11/05/2013   CLINICAL DATA:  Evaluate endotracheal tube. Evaluate pulmonary edema.  EXAM: PORTABLE CHEST - 1 VIEW  COMPARISON:  DG CHEST 1V PORT dated 11/04/2013  FINDINGS: Endotracheal tube is appropriately positioned, terminating 3.7 cm above carina. Mitral valve repair. Nasogastric tube is poorly visualized distally, but likely extends beyond the inferior aspect of the film. Left internal jugular line terminates at the mid SVC.  Cardiomegaly accentuated by AP portable technique. Right hemidiaphragm elevation. No pleural effusion or pneumothorax. Inferior right upper lobe airspace disease is unchanged. Worsened left base aeration with developing consolidation. Mild interstitial edema is similar.  IMPRESSION: Worsened left base aeration, suspicious for atelectasis or infection.  Mild interstitial edema with similar right upper lobe airspace disease, favored to represent atelectasis.   Electronically Signed   By: Jeronimo Greaves M.D.   On: 11/05/2013 07:40   PHYSICAL EXAM General: NAD Neck: JVP 7  cm, no thyromegaly or thyroid nodule.  Lungs: Crackles at bases bilaterally.  CV: Nondisplaced PMI.  Heart regular S1/S2, 1/6 SEM.  No edema.  No carotid bruit.  Abdomen: Soft, nontender, no hepatosplenomegaly, no distention.  Neurologic: alert, oriented to place Psych: Normal affect. Extremities: No clubbing or cyanosis.   TELEMETRY: Reviewed telemetry pt in NSR  ASSESSMENT AND PLAN: 68 yo with history of CAD s/p CABG, ischemic CMP, CKD, and bioprosthetic MVR now s/p cardiac arrest. 1. Cardiac arrest: Suspect ventricular fibrillation, scar-related. Status post hypothermia, now extubated.  He had recurrent VT 2/28 am requiring DCCV.  He is back on amiodarone, now po. He will need ICD eventually => EP has seen (not CRT candidate with narrow QRS).  Plan for discharge today to [facility] with Lifevest, followup with EP for ICD as mental status clears.  2. CAD: s/p CABG. Grafts patent on LHC this admission, suspect cardiac arrest/VT were scar-related rather than ischemia related. On ASA 81 and  statin.   3. Acute on chronic systolic CHF: EF 29%15% with RV dysfunction. Co-ox has been good. He has diuresed well.  Creatinine rose so Lasix held.  - Restart po Lasix today.  - Continue bisoprolol, spironolactone, hydral/Imdur.  4. AKI on CKD: Stable creatinine, restart Lasix today.  5. PT/OT => plan for [facility] [place] today.    6. Neuro: There is some anoxic encephalopathy.  Still some confusion.  Continue to follow. 7. Discharge pain: Followup CHF clinic 1 week.  Followup EP (Allred) 2 wks.  [facility].  Lasix 40 mg po daily (to start tomorrow), amiodarone 200 mg bid, ASA 81, atorvastatin 80, bisoprolol 5 bid, hydralazine 25 tid, Imdur 30 daily, spironolactone 25 daily.  Needs BMET with followup.   Marca AnconaDalton Cheri Ayotte 11/13/2013 8:17 AM

## 2013-11-19 ENCOUNTER — Encounter (HOSPITAL_COMMUNITY): Payer: Managed Care, Other (non HMO)

## 2013-11-20 ENCOUNTER — Encounter (HOSPITAL_COMMUNITY): Payer: Self-pay

## 2013-11-20 ENCOUNTER — Ambulatory Visit (HOSPITAL_COMMUNITY)
Admission: RE | Admit: 2013-11-20 | Discharge: 2013-11-20 | Disposition: A | Discharge status: Home / Self Care | Payer: Managed Care, Other (non HMO) | Source: Ambulatory Visit | Attending: Internal Medicine | Admitting: Internal Medicine

## 2013-11-20 VITALS — BP 126/74 | HR 54 | Resp 18 | Wt 212.0 lb

## 2013-11-20 DIAGNOSIS — I472 Ventricular tachycardia, unspecified: Secondary | ICD-10-CM

## 2013-11-20 DIAGNOSIS — I4729 Other ventricular tachycardia: Secondary | ICD-10-CM

## 2013-11-20 DIAGNOSIS — I5022 Chronic systolic (congestive) heart failure: Secondary | ICD-10-CM

## 2013-11-20 LAB — BASIC METABOLIC PANEL
BUN: 25 mg/dL — ABNORMAL HIGH (ref 6–23)
CHLORIDE: 102 meq/L (ref 96–112)
CO2: 27 meq/L (ref 19–32)
Calcium: 9 mg/dL (ref 8.4–10.5)
Creatinine, Ser: 1.77 mg/dL — ABNORMAL HIGH (ref 0.50–1.35)
GFR calc non Af Amer: 38 mL/min — ABNORMAL LOW (ref >90)
GFR, EST AFRICAN AMERICAN: 44 mL/min — AB (ref >90)
Glucose, Bld: 67 mg/dL — ABNORMAL LOW (ref 70–99)
Potassium: 4.1 mEq/L (ref 3.7–5.3)
SODIUM: 141 meq/L (ref 137–147)

## 2013-11-20 LAB — PRO B NATRIURETIC PEPTIDE: PRO B NATRI PEPTIDE: 1260 pg/mL — AB (ref 0–125)

## 2013-11-20 NOTE — Progress Notes (Signed)
Patient ID: KEAGHAN STATON, male   DOB: 1946/08/02, 68 y.o.   MRN: [id number]  Weight Range   Baseline proBNP    Cardiology: Dr Antoine Poche EP: Dr Johney Frame  HPI: Mr Ottley is a  68 year old with history of CAD , inferior MI 2014,  With papillary muscle rupture and MVR he later had CABG MVR, renal insufficiency, and hype lipidemia. In February he was evaluated by Dr Antoine Poche with recommendations for ICD however he declined requested ongoing medication titration.   He was admitted [facility] 11/01/13 with out of hospital VT/Vfib arrest. He was placed on hypothermia protocol and IV amiodarone. Had urgent LHC with patent grafts noted and no other identifying lesion to explain arrest other than pump failure and arrhythmia. ECHO 11/02/13 EF 15%. On 11/07/13 he recurrent VT and had successful cardioversion. Hospital stay was complicated by post arrest anoxic encephalopathy and acute renal injury (creatinine peak of 2.26) . He was later discharged on amiodarone and a life vest.  Discharge weigh was 198 pounds.   He returns for post hospital follow up with his wife. His wife says he has some short term memory loss.  Denies SOB/PND/Orthopnea. Wears life vest. He is currently at [facility] in [place] for rehab. Working with PT/OT/ST. Denies lower extremity edema.  Weight at SNF has been 206-212 pounds.    ROS: All systems negative except as listed in HPI, PMH and Problem List.  Past Medical History  Diagnosis Date  . High cholesterol   . Hypertension   . Constipation   . Reflux   . Complication of anesthesia     "Hard time waking me up" after sedation after dental procedure  . CAD (coronary artery disease)     LAD 95% proximal stenosis, D1 50-60% stenosis, the circumflex 40% stenosis, RCA subtotal stenosis.  . Cardiomyopathy, ischemic     EF was 30-35% by echo but 45-50% by cath  . Mitral regurgitation     Secondary to papillary muscle rupture    Current Outpatient Prescriptions  Medication Sig Dispense  Refill  . amiodarone (PACERONE) 200 MG tablet Take 1 tablet (200 mg total) by mouth 2 (two) times daily.  60 tablet  11  . aspirin EC 81 MG EC tablet Take 1 tablet (81 mg total) by mouth daily.      . Aspirin-Acetaminophen-Caffeine (EXCEDRIN MIGRAINE PO) Take by mouth as needed.      Marland Kitchen atorvastatin (LIPITOR) 80 MG tablet Place 1 tablet (80 mg total) into feeding tube daily at 6 PM.  30 tablet  11  . bisoprolol (ZEBETA) 5 MG tablet Take 1 tablet (5 mg total) by mouth 2 (two) times daily.  60 tablet  11  . furosemide (LASIX) 40 MG tablet Take 1 tablet (40 mg total) by mouth daily.  30 tablet  11  . hydrALAZINE (APRESOLINE) 25 MG tablet Take 1 tablet (25 mg total) by mouth every 8 (eight) hours.  100 tablet  11  . isosorbide mononitrate (IMDUR) 30 MG 24 hr tablet Take 1 tablet (30 mg total) by mouth daily.  30 tablet  11  . nitroGLYCERIN (NITROSTAT) 0.4 MG SL tablet Place 1 tablet (0.4 mg total) under the tongue every 5 (five) minutes as needed for chest pain.  25 tablet  2  . pantoprazole (PROTONIX) 40 MG tablet Take 1 tablet (40 mg total) by mouth daily.  30 tablet  2  . spironolactone (ALDACTONE) 25 MG tablet Take 1 tablet (25 mg total) by mouth  daily.  30 tablet  11   No current facility-administered medications for this encounter.     PHYSICAL EXAM: Filed Vitals:   11/20/13 1047  BP: 126/74  Pulse: 54  Resp: 18  Weight: 212 lb (96.163 kg)  SpO2: 97%    General:  Well appearing. No resp difficulty Wife present  HEENT: normal Neck: supple. JVP 7-8 . Carotids 2+ bilaterally; no bruits. No lymphadenopathy or thryomegaly appreciated. Cor: PMI normal. Regular rate & rhythm. No rubs, gallops. 2/6 MR TR Soft S3  or murmurs. Lungs: clear Abdomen: soft, nontender, nondistended. No hepatosplenomegaly. No bruits or masses. Good bowel sounds. Extremities: no cyanosis, clubbing, rash, edema Neuro: alert & orientedx2 , cranial nerves grossly intact. Moves all 4 extremities w/o difficulty. Affect  pleasant.    ASSESSMENT & PLAN:  1. Chronic Systolic Heart Failure . NYHA II. Volume status stable. Continue  Lasix 40 mg daily. Instructed SNF to give an additional 40 mg lasix if his weight 215 or greater.  Continue bisoprolol 5 mg twice a day. Continue hydralazine 25 mg tid and imdur 30 mg daily.  Lisinopril was stopped post hospital due to elevated creatinine. Hopefully can add back at some point.  Continue Lifevest. Per Dr [name].  Provided SNF with HF orders set for diet and fluid restrictions.  Check BMET  /pro bnp today---> K 4.1 Creatinine 1.7 Pro BNP 1260   2. VT/VF - S/P cardiac arrest 11/01/13 . Amiodarone per Dr [name]. Follow up with Dr [name] 11/23/13 at 11:00 3:00  3. Memory Loss- Oriented to person and place but unable to provide month, date, year  4. AKI- Creatinine 3/3 but today 1.77   Follow up in 3 weeks.   CLEGG,AMY NP-C  12:58 PM  Patient seen and examined with Tonye BecketAmy Clegg, NP. We discussed all aspects of the encounter. I agree with the assessment and plan as stated above.   He looks much bed after prolonged hospital stay. Volume status looks good. Agree with continuing current medications. Will check labs today. Provided SNF HF order set. Hopefully we can re-initiate ACE-I soon. F/u with EP for ICD.  Daniel Bensimhon,MD 4:22 PM

## 2013-11-23 ENCOUNTER — Encounter: Payer: Self-pay | Admitting: Internal Medicine

## 2013-11-23 ENCOUNTER — Encounter: Payer: Self-pay | Admitting: *Deleted

## 2013-11-23 ENCOUNTER — Ambulatory Visit (INDEPENDENT_AMBULATORY_CARE_PROVIDER_SITE_OTHER): Payer: Managed Care, Other (non HMO) | Admitting: Internal Medicine

## 2013-11-23 VITALS — BP 103/57 | HR 51 | Ht 73.0 in | Wt 206.0 lb

## 2013-11-23 DIAGNOSIS — I2510 Atherosclerotic heart disease of native coronary artery without angina pectoris: Secondary | ICD-10-CM

## 2013-11-23 DIAGNOSIS — I509 Heart failure, unspecified: Secondary | ICD-10-CM

## 2013-11-23 DIAGNOSIS — I469 Cardiac arrest, cause unspecified: Secondary | ICD-10-CM

## 2013-11-23 DIAGNOSIS — I255 Ischemic cardiomyopathy: Secondary | ICD-10-CM

## 2013-11-23 DIAGNOSIS — I2589 Other forms of chronic ischemic heart disease: Secondary | ICD-10-CM

## 2013-11-23 LAB — CBC WITH DIFFERENTIAL/PLATELET
BASOS ABS: 0 10*3/uL (ref 0.0–0.1)
BASOS PCT: 0.4 % (ref 0.0–3.0)
Eosinophils Absolute: 0.4 10*3/uL (ref 0.0–0.7)
Eosinophils Relative: 4.4 % (ref 0.0–5.0)
HEMATOCRIT: 38.9 % — AB (ref 39.0–52.0)
Hemoglobin: 12.8 g/dL — ABNORMAL LOW (ref 13.0–17.0)
LYMPHS ABS: 2.1 10*3/uL (ref 0.7–4.0)
Lymphocytes Relative: 21.2 % (ref 12.0–46.0)
MCHC: 32.8 g/dL (ref 30.0–36.0)
MCV: 92.9 fl (ref 78.0–100.0)
MONOS PCT: 11.5 % (ref 3.0–12.0)
Monocytes Absolute: 1.1 10*3/uL — ABNORMAL HIGH (ref 0.1–1.0)
NEUTROS ABS: 6.1 10*3/uL (ref 1.4–7.7)
Neutrophils Relative %: 62.5 % (ref 43.0–77.0)
Platelets: 245 10*3/uL (ref 150.0–400.0)
RBC: 4.18 Mil/uL — ABNORMAL LOW (ref 4.22–5.81)
RDW: 13.9 % (ref 11.5–14.6)
WBC: 9.7 10*3/uL (ref 4.5–10.5)

## 2013-11-23 LAB — BASIC METABOLIC PANEL
BUN: 25 mg/dL — ABNORMAL HIGH (ref 6–23)
CALCIUM: 9.1 mg/dL (ref 8.4–10.5)
CO2: 29 meq/L (ref 19–32)
Chloride: 102 mEq/L (ref 96–112)
Creatinine, Ser: 2 mg/dL — ABNORMAL HIGH (ref 0.4–1.5)
GFR: 36.29 mL/min — AB (ref >60.00)
GLUCOSE: 94 mg/dL (ref 70–99)
POTASSIUM: 4.6 meq/L (ref 3.5–5.1)
Sodium: 140 mEq/L (ref 135–145)

## 2013-11-23 NOTE — Patient Instructions (Signed)
Your physician has recommended that you have a defibrillator inserted. An implantable cardioverter defibrillator (ICD) is a small device that is placed in your chest or, in rare cases, your abdomen. This device uses electrical pulses or shocks to help control life-threatening, irregular heartbeats that could lead the heart to suddenly stop beating (sudden cardiac arrest). Leads are attached to the ICD that goes into your heart. This is done in the hospital and usually requires an overnight stay. Please see the instruction sheet given to you today for more information.  See instruction sheet 

## 2013-11-23 NOTE — Progress Notes (Signed)
Primary Cardiologist:  Dr Shirlee LatchMcLean  The patient presents today for electrophysiology followup.  Since his hospital discharge, the patient reports doing reasonably well.  His exercise tolerance is improving.  He has chest wall soreness.  His mental status continues to improve.  Today, he denies symptoms of palpitations, chest pain, shortness of breath, orthopnea, PND, lower extremity edema, dizziness, presyncope, syncope, or new neurologic sequela.  The patient feels that he is tolerating medications without difficulties and is otherwise without complaint today.   Past Medical History  Diagnosis Date  . High cholesterol   . Hypertension   . Constipation   . Reflux   . Complication of anesthesia     "Hard time waking me up" after sedation after dental procedure  . CAD (coronary artery disease)     LAD 95% proximal stenosis, D1 50-60% stenosis, the circumflex 40% stenosis, RCA subtotal stenosis.  . Cardiomyopathy, ischemic     EF was 30-35% by echo but 45-50% by cath  . Mitral regurgitation     Secondary to papillary muscle rupture   Past Surgical History  Procedure Laterality Date  . Stomach ulcer repair    . Tee without cardioversion N/A 02/04/2013    Procedure: TRANSESOPHAGEAL ECHOCARDIOGRAM (TEE);  Surgeon: Vesta MixerPhilip J Nahser, MD;  [facility];  Service: Cardiovascular;  Laterality: N/A;  . Intraoperative transesophageal echocardiogram N/A 02/05/2013    Procedure: INTRAOPERATIVE TRANSESOPHAGEAL ECHOCARDIOGRAM;  Surgeon: Loreli SlotSteven C Hendrickson, MD;  [facility];  Service: Open Heart Surgery;  Laterality: N/A;  . Endovein harvest of greater saphenous vein Right 02/05/2013    Procedure: ENDOVEIN HARVEST OF GREATER SAPHENOUS VEIN;  Surgeon: Loreli SlotSteven C Hendrickson, MD;  [facility];  Service: Open Heart Surgery;  Laterality: Right;  . Patent foramen ovale closure N/A 02/05/2013    Procedure: PATENT FORAMEN OVALE CLOSURE;  Surgeon: Loreli SlotSteven C Hendrickson, MD;  [facility];   Service: Open Heart Surgery;  Laterality: N/A;  . Mitral valve replacement (mvr)/coronary artery bypass grafting (cabg) N/A 02/05/2013    Procedure: MITRAL VALVE REPLACEMENT (MVR)/CORONARY ARTERY BYPASS GRAFTING (CABG);  Surgeon: Loreli SlotSteven C Hendrickson, MD;  [facility];  Service: Open Heart Surgery;  Laterality: N/A;  x3 using right greater saphenous vein and left internal mammary.   . Coronary artery bypass graft      Current Outpatient Prescriptions  Medication Sig Dispense Refill  . amiodarone (PACERONE) 200 MG tablet Take 1 tablet (200 mg total) by mouth 2 (two) times daily.  60 tablet  11  . aspirin EC 81 MG EC tablet Take 1 tablet (81 mg total) by mouth daily.      . Aspirin-Acetaminophen-Caffeine (EXCEDRIN MIGRAINE PO) Take by mouth as needed.      Marland Kitchen. atorvastatin (LIPITOR) 80 MG tablet Place 1 tablet (80 mg total) into feeding tube daily at 6 PM.  30 tablet  11  . bisoprolol (ZEBETA) 5 MG tablet Take 1 tablet (5 mg total) by mouth 2 (two) times daily.  60 tablet  11  . furosemide (LASIX) 40 MG tablet Take 1 tablet (40 mg total) by mouth daily.  30 tablet  11  . hydrALAZINE (APRESOLINE) 25 MG tablet Take 1 tablet (25 mg total) by mouth every 8 (eight) hours.  100 tablet  11  . isosorbide mononitrate (IMDUR) 30 MG 24 hr tablet Take 1 tablet (30 mg total) by mouth daily.  30 tablet  11  . nitroGLYCERIN (NITROSTAT) 0.4 MG SL tablet Place 1 tablet (0.4 mg total) under  the tongue every 5 (five) minutes as needed for chest pain.  25 tablet  2  . pantoprazole (PROTONIX) 40 MG tablet Take 1 tablet (40 mg total) by mouth daily.  30 tablet  2  . spironolactone (ALDACTONE) 25 MG tablet Take 1 tablet (25 mg total) by mouth daily.  30 tablet  11   No current facility-administered medications for this visit.    No Known Allergies  History   Social History  . Marital Status: Married    Spouse Name: N/A    Number of Children: N/A  . Years of Education: N/A   Occupational History  . Not on  file.   Social History Main Topics  . Smoking status: Never Smoker   . Smokeless tobacco: Not on file     Comment: 40 yrs ago  . Alcohol Use: Yes     Comment: week end - 1/5 of crown  . Drug Use: No  . Sexual Activity: Not on file   Other Topics Concern  . Not on file   Social History Narrative  . No narrative on file    Family History  Problem Relation Age of Onset  . Heart attack Mother 45    ROS-  All systems are reviewed and are negative except as outlined in the HPI above  Physical Exam: Filed Vitals:   11/23/13 1047  BP: 103/57  Pulse: 51  Height: 6\' 1"  (1.854 m)  Weight: 206 lb (93.441 kg)    GEN- The patient is well appearing, alert and oriented to person, place and year.  Much less confused than when he was in the hospital Head- normocephalic, atraumatic Eyes-  Sclera clear, conjunctiva pink Ears- hearing intact Oropharynx- clear Neck- supple  Lungs- Clear to ausculation bilaterally, normal work of breathing Heart- Regular rate and rhythm, no murmurs, rubs or gallops, PMI not laterally displaced GI- soft, NT, ND, + BS Extremities- no clubbing, cyanosis, or edema MS- no significant deformity or atrophy Skin- no rash or lesion Psych- euthymic mood, full affect Neuro- strength and sensation are intact  Assessment and Plan:  The patient has an ischemic CM (EF most recently 15%), NYHA Class III CHF, and CAD. He was recently admitted with VT/VF arrest for which he required defibrillation in the field. He appears to be making meaningful recovery.  He meets criteria for ICD implantation for secondary treatment of VT/VF.  His QRS reveals IVCD at 130 msec.  I do not therefore think that he would benefit from CRT at this time.  Risks, benefits, alternatives to ICD implantation were discussed in detail with the patient today. The patient  understands that the risks include but are not limited to bleeding, infection, pneumothorax, perforation, tamponade, vascular  damage, renal failure, MI, stroke, death, inappropriate shocks, and lead dislodgement and wishes to proceed.  We will therefore schedule device implantation at the next available time.

## 2013-11-27 ENCOUNTER — Encounter (HOSPITAL_COMMUNITY): Payer: Self-pay

## 2013-11-30 ENCOUNTER — Encounter: Payer: Managed Care, Other (non HMO) | Admitting: Internal Medicine

## 2013-12-02 MED ORDER — CEFAZOLIN SODIUM-DEXTROSE 2-3 GM-% IV SOLR
2.0000 g | INTRAVENOUS | Status: DC
  Filled 2013-12-02: qty 50

## 2013-12-02 MED ORDER — SODIUM CHLORIDE 0.9 % IV SOLN
INTRAVENOUS | Status: DC
  Administered 2013-12-03: 09:00:00 via INTRAVENOUS

## 2013-12-02 MED ORDER — SODIUM CHLORIDE 0.9 % IR SOLN
80.0000 mg | Status: DC
  Filled 2013-12-02 (×2): qty 2

## 2013-12-03 ENCOUNTER — Ambulatory Visit (HOSPITAL_COMMUNITY)
Admission: RE | Admit: 2013-12-03 | Discharge: 2013-12-04 | Disposition: A | Discharge status: Home / Self Care | Payer: Managed Care, Other (non HMO) | Source: Ambulatory Visit | Attending: Internal Medicine | Admitting: Internal Medicine

## 2013-12-03 ENCOUNTER — Encounter (HOSPITAL_COMMUNITY)
Admission: RE | Disposition: A | Discharge status: Home / Self Care | Payer: Self-pay | Source: Ambulatory Visit | Attending: Internal Medicine

## 2013-12-03 ENCOUNTER — Encounter (HOSPITAL_COMMUNITY): Payer: Self-pay | Admitting: *Deleted

## 2013-12-03 DIAGNOSIS — I5022 Chronic systolic (congestive) heart failure: Secondary | ICD-10-CM | POA: Insufficient documentation

## 2013-12-03 DIAGNOSIS — I255 Ischemic cardiomyopathy: Secondary | ICD-10-CM

## 2013-12-03 DIAGNOSIS — I2589 Other forms of chronic ischemic heart disease: Secondary | ICD-10-CM | POA: Insufficient documentation

## 2013-12-03 DIAGNOSIS — I2510 Atherosclerotic heart disease of native coronary artery without angina pectoris: Secondary | ICD-10-CM | POA: Insufficient documentation

## 2013-12-03 DIAGNOSIS — I469 Cardiac arrest, cause unspecified: Secondary | ICD-10-CM

## 2013-12-03 DIAGNOSIS — I10 Essential (primary) hypertension: Secondary | ICD-10-CM | POA: Insufficient documentation

## 2013-12-03 DIAGNOSIS — I059 Rheumatic mitral valve disease, unspecified: Secondary | ICD-10-CM | POA: Insufficient documentation

## 2013-12-03 DIAGNOSIS — E785 Hyperlipidemia, unspecified: Secondary | ICD-10-CM | POA: Insufficient documentation

## 2013-12-03 DIAGNOSIS — N289 Disorder of kidney and ureter, unspecified: Secondary | ICD-10-CM | POA: Insufficient documentation

## 2013-12-03 DIAGNOSIS — I509 Heart failure, unspecified: Secondary | ICD-10-CM | POA: Insufficient documentation

## 2013-12-03 DIAGNOSIS — E7800 Pure hypercholesterolemia, unspecified: Secondary | ICD-10-CM | POA: Insufficient documentation

## 2013-12-03 DIAGNOSIS — I519 Heart disease, unspecified: Secondary | ICD-10-CM | POA: Diagnosis present

## 2013-12-03 HISTORY — DX: Personal history of sudden cardiac arrest: Z86.74

## 2013-12-03 HISTORY — PX: IMPLANTABLE CARDIOVERTER DEFIBRILLATOR IMPLANT: SHX5473

## 2013-12-03 HISTORY — PX: IMPLANTABLE CARDIOVERTER DEFIBRILLATOR IMPLANT: SHX5860

## 2013-12-03 LAB — SURGICAL PCR SCREEN
MRSA, PCR: NEGATIVE
STAPHYLOCOCCUS AUREUS: NEGATIVE

## 2013-12-03 SURGERY — IMPLANTABLE CARDIOVERTER DEFIBRILLATOR IMPLANT
Anesthesia: LOCAL

## 2013-12-03 MED ORDER — SODIUM CHLORIDE 0.9 % IJ SOLN
3.0000 mL | Freq: Two times a day (BID) | INTRAMUSCULAR | Status: DC
  Administered 2013-12-03: 3 mL via INTRAVENOUS

## 2013-12-03 MED ORDER — MUPIROCIN 2 % EX OINT
TOPICAL_OINTMENT | CUTANEOUS | Status: AC
  Filled 2013-12-03: qty 22

## 2013-12-03 MED ORDER — ISOSORBIDE MONONITRATE ER 30 MG PO TB24
30.0000 mg | ORAL_TABLET | Freq: Every day | ORAL | Status: DC
  Filled 2013-12-03: qty 1

## 2013-12-03 MED ORDER — CEFAZOLIN SODIUM 1-5 GM-% IV SOLN
1.0000 g | Freq: Four times a day (QID) | INTRAVENOUS | Status: AC
  Administered 2013-12-03 – 2013-12-04 (×3): 1 g via INTRAVENOUS
  Filled 2013-12-03 (×3): qty 50

## 2013-12-03 MED ORDER — HYDRALAZINE HCL 25 MG PO TABS
25.0000 mg | ORAL_TABLET | Freq: Three times a day (TID) | ORAL | Status: DC
  Administered 2013-12-03 – 2013-12-04 (×2): 25 mg via ORAL
  Filled 2013-12-03 (×6): qty 1

## 2013-12-03 MED ORDER — BISOPROLOL FUMARATE 5 MG PO TABS
5.0000 mg | ORAL_TABLET | Freq: Two times a day (BID) | ORAL | Status: DC
  Administered 2013-12-03: 5 mg via ORAL
  Filled 2013-12-03 (×5): qty 1

## 2013-12-03 MED ORDER — MUPIROCIN 2 % EX OINT: TOPICAL_OINTMENT | Freq: Two times a day (BID) | CUTANEOUS | Status: DC

## 2013-12-03 MED ORDER — ATORVASTATIN CALCIUM 80 MG PO TABS
80.0000 mg | ORAL_TABLET | Freq: Every day | ORAL | Status: DC
  Administered 2013-12-03: 80 mg
  Filled 2013-12-03 (×3): qty 1

## 2013-12-03 MED ORDER — HEPARIN (PORCINE) IN NACL 2-0.9 UNIT/ML-% IJ SOLN
INTRAMUSCULAR | Status: AC
  Filled 2013-12-03: qty 500

## 2013-12-03 MED ORDER — LIDOCAINE HCL (PF) 1 % IJ SOLN
INTRAMUSCULAR | Status: AC
  Filled 2013-12-03: qty 60

## 2013-12-03 MED ORDER — ACETAMINOPHEN 325 MG PO TABS: 325.0000 mg | ORAL_TABLET | ORAL | Status: DC | PRN

## 2013-12-03 MED ORDER — LIDOCAINE HCL (PF) 1 % IJ SOLN
INTRAMUSCULAR | Status: AC
  Filled 2013-12-03: qty 30

## 2013-12-03 MED ORDER — SODIUM CHLORIDE 0.9 % IV SOLN: 250.0000 mL | INTRAVENOUS | Status: DC | PRN

## 2013-12-03 MED ORDER — MIDAZOLAM HCL 5 MG/5ML IJ SOLN
INTRAMUSCULAR | Status: AC
Start: 2013-12-03 — End: 2013-12-03
  Filled 2013-12-03: qty 5

## 2013-12-03 MED ORDER — HYDROCODONE-ACETAMINOPHEN 5-325 MG PO TABS
1.0000 | ORAL_TABLET | ORAL | Status: DC | PRN
  Administered 2013-12-03: 1 via ORAL
  Filled 2013-12-03: qty 2

## 2013-12-03 MED ORDER — PANTOPRAZOLE SODIUM 40 MG PO TBEC: 40.0000 mg | DELAYED_RELEASE_TABLET | Freq: Every day | ORAL | Status: DC

## 2013-12-03 MED ORDER — ONDANSETRON HCL 4 MG/2ML IJ SOLN: 4.0000 mg | Freq: Four times a day (QID) | INTRAMUSCULAR | Status: DC | PRN

## 2013-12-03 MED ORDER — ASPIRIN EC 81 MG PO TBEC
81.0000 mg | DELAYED_RELEASE_TABLET | Freq: Every day | ORAL | Status: DC
  Filled 2013-12-03: qty 1

## 2013-12-03 MED ORDER — FENTANYL CITRATE 0.05 MG/ML IJ SOLN
INTRAMUSCULAR | Status: AC
  Filled 2013-12-03: qty 2

## 2013-12-03 MED ORDER — SODIUM CHLORIDE 0.9 % IJ SOLN: 3.0000 mL | INTRAMUSCULAR | Status: DC | PRN

## 2013-12-03 NOTE — Interval H&P Note (Signed)
ICD Criteria  Current LVEF: 15% ;Obtained > 3 months ago and < or = 6 months ago.   NYHA Functional Classification: Class III  Heart Failure History:  Yes, Duration of heart failure since onset is 3 to 9 months  Non-Ischemic Dilated Cardiomyopathy History:  No.  Atrial Fibrillation/Atrial Flutter:  No.  Ventricular Tachycardia History:  Yes, Hemodynamic instability present, VT Type:  SVT - Polymorphic.  Cardiac Arrest History:  Yes, This was a Ventricular Tachycardia/Ventricular Fibrillation Arrest. This was NOT a bradycardia arrest.  History of Syndromes with Risk of Sudden Death:  No.  Previous ICD:  No.  Electrophysiology Study: No.  Prior MI: Yes, Most recent MI timeframe is > 40 days.  PPM: No.  OSA:  No  Patient Life Expectancy of >=1 year: Yes.  Anticoagulation Therapy:  Patient is NOT on anticoagulation therapy.   Beta Blocker Therapy:  Yes.   Ace Inhibitor/ARB Therapy:  No, Reason not on Ace Inhibitor/ARB therapy:  renal failure

## 2013-12-03 NOTE — Op Note (Signed)
SURGEON:  Hillis Range, MD      PREPROCEDURE DIAGNOSES:   1. Ischemic cardiomyopathy.   2. New York Heart Association class III, heart failure chronically.   3. Prior VF arrest     POSTPROCEDURE DIAGNOSES:   1. Ischemic cardiomyopathy.   2. New York Heart Association class III, heart failure chronically.   3. Prior VF arrest     PROCEDURES:    1. ICD implantation.  2. Defibrillation threshold testing     INTRODUCTION: Lonnie [name] is a 68 y.o. male with an ischemic CM (EF 15%), NYHA Class III CHF, and CAD. He was recently admitted with VF arrest requiring defibrillation in the field.  He has made meaningful recovery.  No reversible causes have been found.  At this time, he meets criteria for ICD implantation for secondary prevention of sudden death.  The patient has a narrow QRS and does not meet criteria for revascularization.  The patient has been treated with an optimal medical regimen but continues to have a depressed ejection fraction and NYHA Class III CHF symptoms.  The patient therefore  presents today for ICD implantation.      DESCRIPTION OF PROCEDURE:  Informed written consent was obtained and the patient was brought to the electrophysiology lab in the fasting state. The patient was adequately sedated with intravenous Versed, and fentanyl as outlined in the nursing report.  The patient's left chest was prepped and draped in the usual sterile fashion by the EP lab staff.  The skin overlying the left deltopectoral region was infiltrated with lidocaine for local analgesia.  A 5-cm incision was made over the left deltopectoral region.  A left subcutaneous defibrillator pocket was fashioned using a combination of sharp and blunt dissection.  Electrocautery was used to assure hemostasis.   RA/RV Lead Placement: The left axillary vein was cannulated with fluoroscopic visualization.  No contrast was required for this endeavor.  Through the left axillary vein, a [employer]  model  [phone]  (serial # [id number]  ) right atrial lead and a [employer] [place], model 5409-81 (serial number [id number]) right ventricular defibrillator lead were advanced with fluoroscopic visualization into the right atrial appendage and right ventricular apex positions respectively.  Initial atrial lead P-waves measured 1.7 mV with an impedance of 580 ohms and a threshold of 0.9 volts at 0.5 milliseconds.  The right ventricular lead R-wave measured 13 mV with impedance of 630 ohms and a threshold of 0.7 volts at 0.5 milliseconds.   The leads were secured to the pectoralis  fascia using #2 silk suture over the suture sleeves.  The pocket then  irrigated with copious gentamicin solution.  The leads were then  connected to a [facility] [employer] DR model [phone] (serial  Number [id number]) ICD.  The defibrillator was placed into the  pocket.  The pocket was then closed in 2 layers with 2.0 Vicryl suture  for the subcutaneous and subcuticular layers.  Steri-Strips and a  sterile dressing were then applied.   DFT Testing: Defibrillation Threshold testing was then performed. Ventricular fibrillation was induced with a T shock.  Adequate sensing of ventricular  fibrillation was observed with minimal dropout with a programmed sensitivity of 1.4mV.  The patient was successfully defibrillated to sinus rhythm with a single 15 joules shock delivered from the device with an impedance of 65 ohms in a duration of 5 seconds.  The patient remained in sinus rhythm thereafter.  There were no early apparent complications.  No contrast was required for the procedure today.  Programmed Extrastimulus testing:  Programmed extrastimulus testing was performed through the device with a basic cycle length of with S1,S2,S3,S4 extrastimuli down to refractoriness (500/320/260/260 msec) with no sustained VT or VF observed.  The procedure was therefore considered completed.  There were no early apparhent  complications.     CONCLUSIONS:   1. Ischemic cardiomyopathy with chronic New York Heart Association class III heart failure with successful resuscitation from a prior VF arrest  2. Successful ICD implantation for secondary prevention of sudden death  3. DFT less than or equal to 15 joules.   4. No inducible VT or VF with PES  5. No early apparent complications.

## 2013-12-03 NOTE — Discharge Instructions (Signed)
° °  Supplemental Discharge Instructions for  Pacemaker/Defibrillator Patients  Activity No heavy lifting or vigorous activity with your left/right arm for 6 to 8 weeks.  Do not raise your left/right arm above your head for one week.  Gradually raise your affected arm as drawn below.           03/30                      03/31                       04/01                      04/02       NO DRIVING until given clearance by Dr. [name]. WOUND CARE   Keep the wound area clean and dry.  Do not get this area wet for one week. No showers for one week; you may shower on 12/11/2013.   The tape/steri-strips on your wound will fall off; do not pull them off.  No bandage is needed on the site.  DO NOT apply any creams, oils, or ointments to the wound area.   If you notice any drainage or discharge from the wound, any swelling or bruising at the site, or you develop a fever > 101? F after you are discharged home, call the office at once.  Special Instructions   You are still able to use cellular telephones; use the ear opposite the side where you have your pacemaker/defibrillator.  Avoid carrying your cellular phone near your device.   When traveling through airports, show security personnel your identification card to avoid being screened in the metal detectors.  Ask the security personnel to use the hand wand.   Avoid arc welding equipment, MRI testing (magnetic resonance imaging), TENS units (transcutaneous nerve stimulators).  Call the office for questions about other devices.   Avoid electrical appliances that are in poor condition or are not properly grounded.   Microwave ovens are safe to be near or to operate.  Additional information for defibrillator patients should your device go off:   If your device goes off ONCE and you feel fine afterward, notify the device clinic nurses.   If your device goes off ONCE and you do not feel well afterward, call 911.   If your device goes off TWICE, call 911.   If your device goes off THREE times in one day, call 911.  DO NOT DRIVE YOURSELF OR A FAMILY MEMBER WITH A DEFIBRILLATOR TO THE HOSPITAL--CALL 911.

## 2013-12-03 NOTE — Progress Notes (Signed)
UR Completed Detrich Rakestraw Graves-Bigelow, RN,BSN [phone]  

## 2013-12-03 NOTE — Interval H&P Note (Signed)
History and Physical Interval Note:  12/03/2013 11:17 AM  Lonnie [name]  has presented today for surgery, with the diagnosis of V-Fib  The various methods of treatment have been discussed with the patient and family. After consideration of risks, benefits and other options for treatment, the patient has consented to  Procedure(s): IMPLANTABLE CARDIOVERTER DEFIBRILLATOR IMPLANT (N/A) as a surgical intervention .  The patient's history has been reviewed, patient examined, no change in status, stable for surgery.  I have reviewed the patient's chart and labs.  Questions were answered to the patient's satisfaction.     Hillis RangeJames Elleen Coulibaly

## 2013-12-03 NOTE — Progress Notes (Signed)
Orthopedic Tech Progress Note Patient Details:  Bethann BerkshireRobert L Bamba [birth year] [id number]  Ortho Devices Type of Ortho Device: Arm sling Ortho Device/Splint Interventions: Application   Cammer, Mickie BailJennifer Carol 12/03/2013, 2:18 PM

## 2013-12-03 NOTE — H&P (View-Only) (Signed)
Primary Cardiologist:  Dr Shirlee LatchMcLean  The patient presents today for electrophysiology followup.  Since his hospital discharge, the patient reports doing reasonably well.  His exercise tolerance is improving.  He has chest wall soreness.  His mental status continues to improve.  Today, he denies symptoms of palpitations, chest pain, shortness of breath, orthopnea, PND, lower extremity edema, dizziness, presyncope, syncope, or new neurologic sequela.  The patient feels that he is tolerating medications without difficulties and is otherwise without complaint today.   Past Medical History  Diagnosis Date  . High cholesterol   . Hypertension   . Constipation   . Reflux   . Complication of anesthesia     "Hard time waking me up" after sedation after dental procedure  . CAD (coronary artery disease)     LAD 95% proximal stenosis, D1 50-60% stenosis, the circumflex 40% stenosis, RCA subtotal stenosis.  . Cardiomyopathy, ischemic     EF was 30-35% by echo but 45-50% by cath  . Mitral regurgitation     Secondary to papillary muscle rupture   Past Surgical History  Procedure Laterality Date  . Stomach ulcer repair    . Tee without cardioversion N/A 02/04/2013    Procedure: TRANSESOPHAGEAL ECHOCARDIOGRAM (TEE);  Surgeon: Vesta MixerPhilip J Nahser, MD;  [facility];  Service: Cardiovascular;  Laterality: N/A;  . Intraoperative transesophageal echocardiogram N/A 02/05/2013    Procedure: INTRAOPERATIVE TRANSESOPHAGEAL ECHOCARDIOGRAM;  Surgeon: Loreli SlotSteven C Hendrickson, MD;  [facility];  Service: Open Heart Surgery;  Laterality: N/A;  . Endovein harvest of greater saphenous vein Right 02/05/2013    Procedure: ENDOVEIN HARVEST OF GREATER SAPHENOUS VEIN;  Surgeon: Loreli SlotSteven C Hendrickson, MD;  [facility];  Service: Open Heart Surgery;  Laterality: Right;  . Patent foramen ovale closure N/A 02/05/2013    Procedure: PATENT FORAMEN OVALE CLOSURE;  Surgeon: Loreli SlotSteven C Hendrickson, MD;  [facility];   Service: Open Heart Surgery;  Laterality: N/A;  . Mitral valve replacement (mvr)/coronary artery bypass grafting (cabg) N/A 02/05/2013    Procedure: MITRAL VALVE REPLACEMENT (MVR)/CORONARY ARTERY BYPASS GRAFTING (CABG);  Surgeon: Loreli SlotSteven C Hendrickson, MD;  [facility];  Service: Open Heart Surgery;  Laterality: N/A;  x3 using right greater saphenous vein and left internal mammary.   . Coronary artery bypass graft      Current Outpatient Prescriptions  Medication Sig Dispense Refill  . amiodarone (PACERONE) 200 MG tablet Take 1 tablet (200 mg total) by mouth 2 (two) times daily.  60 tablet  11  . aspirin EC 81 MG EC tablet Take 1 tablet (81 mg total) by mouth daily.      . Aspirin-Acetaminophen-Caffeine (EXCEDRIN MIGRAINE PO) Take by mouth as needed.      Marland Kitchen. atorvastatin (LIPITOR) 80 MG tablet Place 1 tablet (80 mg total) into feeding tube daily at 6 PM.  30 tablet  11  . bisoprolol (ZEBETA) 5 MG tablet Take 1 tablet (5 mg total) by mouth 2 (two) times daily.  60 tablet  11  . furosemide (LASIX) 40 MG tablet Take 1 tablet (40 mg total) by mouth daily.  30 tablet  11  . hydrALAZINE (APRESOLINE) 25 MG tablet Take 1 tablet (25 mg total) by mouth every 8 (eight) hours.  100 tablet  11  . isosorbide mononitrate (IMDUR) 30 MG 24 hr tablet Take 1 tablet (30 mg total) by mouth daily.  30 tablet  11  . nitroGLYCERIN (NITROSTAT) 0.4 MG SL tablet Place 1 tablet (0.4 mg total) under  the tongue every 5 (five) minutes as needed for chest pain.  25 tablet  2  . pantoprazole (PROTONIX) 40 MG tablet Take 1 tablet (40 mg total) by mouth daily.  30 tablet  2  . spironolactone (ALDACTONE) 25 MG tablet Take 1 tablet (25 mg total) by mouth daily.  30 tablet  11   No current facility-administered medications for this visit.    No Known Allergies  History   Social History  . Marital Status: Married    Spouse Name: N/A    Number of Children: N/A  . Years of Education: N/A   Occupational History  . Not on  file.   Social History Main Topics  . Smoking status: Never Smoker   . Smokeless tobacco: Not on file     Comment: 40 yrs ago  . Alcohol Use: Yes     Comment: week end - 1/5 of crown  . Drug Use: No  . Sexual Activity: Not on file   Other Topics Concern  . Not on file   Social History Narrative  . No narrative on file    Family History  Problem Relation Age of Onset  . Heart attack Mother 45    ROS-  All systems are reviewed and are negative except as outlined in the HPI above  Physical Exam: Filed Vitals:   11/23/13 1047  BP: 103/57  Pulse: 51  Height: 6\' 1"  (1.854 m)  Weight: 206 lb (93.441 kg)    GEN- The patient is well appearing, alert and oriented to person, place and year.  Much less confused than when he was in the hospital Head- normocephalic, atraumatic Eyes-  Sclera clear, conjunctiva pink Ears- hearing intact Oropharynx- clear Neck- supple  Lungs- Clear to ausculation bilaterally, normal work of breathing Heart- Regular rate and rhythm, no murmurs, rubs or gallops, PMI not laterally displaced GI- soft, NT, ND, + BS Extremities- no clubbing, cyanosis, or edema MS- no significant deformity or atrophy Skin- no rash or lesion Psych- euthymic mood, full affect Neuro- strength and sensation are intact  Assessment and Plan:  The patient has an ischemic CM (EF most recently 15%), NYHA Class III CHF, and CAD. He was recently admitted with VT/VF arrest for which he required defibrillation in the field. He appears to be making meaningful recovery.  He meets criteria for ICD implantation for secondary treatment of VT/VF.  His QRS reveals IVCD at 130 msec.  I do not therefore think that he would benefit from CRT at this time.  Risks, benefits, alternatives to ICD implantation were discussed in detail with the patient today. The patient  understands that the risks include but are not limited to bleeding, infection, pneumothorax, perforation, tamponade, vascular  damage, renal failure, MI, stroke, death, inappropriate shocks, and lead dislodgement and wishes to proceed.  We will therefore schedule device implantation at the next available time.

## 2013-12-03 NOTE — Discharge Summary (Signed)
ELECTROPHYSIOLOGY PROCEDURE DISCHARGE SUMMARY    Patient ID: Lonnie [name],  MRN: [id number], DOB/AGE: 04/10/1946 68 y.o.  Admit date: 12/03/2013 DischBethann Berkshirearge date: 12/04/2013  Primary Care Physician: Kirk RuthsMCGOUGH,WILLIAM M, MD Primary Cardiologist: Shirlee LatchMcLean Electrophysiologist: Akim Watkinson  Primary Discharge Diagnosis:  Ischemic cardiomyopathy, prior VF arrest, s/p ICD implant this admission  Secondary Discharge Diagnosis:  1.  Class III heart failure 2.  Hypertension 3.  Hyperlipidemia 4.  CAD - LAD 95% proximal stenosis, D1 50-60% stenosis, the circumflex 40% stenosis, RCA subtotal stenosis. 5.  Mitral regurgitation 2/2 papillary muscle rupture   No Known Allergies   Procedures This Admission:  1.  Implantation of a STJ dual chamber ICD on 12-03-2013 by Dr [name].  The patient received a STJ model number [id number] ICD with model number 2088 right atrial lead, 7122 right ventricular lead.  DFT's were successful at 15 J.  There were no immediate post procedure complications. 2.  CXR on 12-04-2013 demonstrated no pneumothorax status post device implantation.   Brief HPI: Lonnie [name] is a 68 y.o. male with an ischemic CM (EF 15%), NYHA Class III CHF, and CAD. He was recently admitted with VF arrest requiring defibrillation in the field. He has made meaningful recovery. No reversible causes have been found. At this time, he meets criteria for ICD implantation for secondary prevention of sudden death. The patient has a narrow QRS and does not meet criteria for revascularization. The patient has been treated with an optimal medical regimen but continues to have a depressed ejection fraction and NYHA Class III CHF symptoms. Risks, benefits, and alternatives to ICD implantation were reviewed with the patient who wished to proceed.    Hospital Course:  The patient was admitted and underwent implantation of a STJ dual chamber ICD with details as outlined above.   He was monitored on telemetry  overnight which demonstrated sinus rhythm.  Left chest was without hematoma or ecchymosis.  The device was interrogated and found to be functioning normally.  CXR was obtained and demonstrated no pneumothorax status post device implantation.  Wound care, arm mobility, and restrictions were reviewed with the patient.  Dr [name] examined the patient and considered them stable for discharge to home.   The patient's discharge medications include a beta blocker (Bisoprolol). He is not on an ACE-I due to renal insufficiency.   Discharge Vitals: Blood pressure 120/76, pulse 53, temperature 98.3 F (36.8 C), temperature source Oral, resp. rate 18, height 6\' 1"  (1.854 m), weight 205 lb (92.987 kg), SpO2 92.00%.   Physical Exam: Filed Vitals:   12/03/13 1325 12/03/13 2152 12/04/13 0044 12/04/13 0445  BP: 106/65 104/64 105/59 120/76  Pulse: 52 53 51 53  Temp: 98.3 F (36.8 C)   98.3 F (36.8 C)  TempSrc: Oral   Oral  Resp:    18  Height:      Weight:      SpO2: 98% 96%  92%    GEN- The patient is well appearing, alert and oriented x 3 today.   Head- normocephalic, atraumatic Eyes-  Sclera clear, conjunctiva pink Ears- hearing intact Oropharynx- clear Neck- supple, no JVP Lymph- no cervical lymphadenopathy Lungs- Clear to ausculation bilaterally, normal work of breathing Heart- Regular rate and rhythm, no murmurs, rubs or gallops, PMI not laterally displaced GI- soft, NT, ND, + BS Extremities- no clubbing, cyanosis, or edema Device pocket without hematoma Neuro- strength and sensation are intact  Labs:   Lab Results  Component Value Date  WBC 9.7 11/23/2013   HGB 12.8* 11/23/2013   HCT 38.9* 11/23/2013   MCV 92.9 11/23/2013   PLT 245.0 11/23/2013   No results found for this basename: NA, K, CL, CO2, BUN, CREATININE, CALCIUM, LABALBU, PROT, BILITOT, ALKPHOS, ALT, AST, GLUCOSE,  in the last 168 hours  Discharge Medications:    Medication List    STOP taking these medications        amiodarone 200 MG tablet  Commonly known as:  PACERONE     spironolactone 25 MG tablet  Commonly known as:  ALDACTONE      TAKE these medications       aspirin 81 MG EC tablet  Take 1 tablet (81 mg total) by mouth daily.     atorvastatin 80 MG tablet  Commonly known as:  LIPITOR  Place 1 tablet (80 mg total) into feeding tube daily at 6 PM.     bisoprolol 5 MG tablet  Commonly known as:  ZEBETA  Take 1 tablet (5 mg total) by mouth 2 (two) times daily.     EXCEDRIN MIGRAINE PO  Take by mouth as needed (migraines).     furosemide 40 MG tablet  Commonly known as:  LASIX  Take 1 tablet (40 mg total) by mouth daily.     hydrALAZINE 25 MG tablet  Commonly known as:  APRESOLINE  Take 1 tablet (25 mg total) by mouth every 8 (eight) hours.     isosorbide mononitrate 30 MG 24 hr tablet  Commonly known as:  IMDUR  Take 1 tablet (30 mg total) by mouth daily.     nitroGLYCERIN 0.4 MG SL tablet  Commonly known as:  NITROSTAT  Place 1 tablet (0.4 mg total) under the tongue every 5 (five) minutes as needed for chest pain.     omeprazole 20 MG capsule  Commonly known as:  PRILOSEC  Take 1 capsule (20 mg total) by mouth 3 (three) times daily before meals.        Disposition:   Future Appointments Provider Department Dept Phone   12/08/2013 10:25 AM [facility] Office [phone]   12/14/2013 10:00 AM [facility] [place] Office [phone]   01/21/2014 9:00 AM Rollene Rotunda, MD [facility] [place] Office [phone]   03/05/2014 9:15 AM Hillis Range, MD [facility] Office [phone]     Follow-up Information   Follow up with [facility] Office On 12/14/2013. (At 10:00 AM for wound check)    Specialty:  Cardiology   Contact information:   [address] [place] [address] [phone]      Follow up with Hillis Range, MD On 03/05/2014. (At 9:15 AM)    Specialty:  Cardiology     Contact information:   [address] [place] [address] [phone]       Follow up with Rollene Rotunda, MD On 01/21/2014. (9:00 AM)    Specialty:  Cardiology   Contact information:   [address] [place] [address] [phone]       Follow up with [facility] On 12/08/2013. (anytime between 8:30am and 3:30pm)    Contact information:   [address] [place] [address] [phone]      Duration of Discharge Encounter: Greater than 30 minutes including physician time.  Signed,  Hillis Range MD

## 2013-12-04 ENCOUNTER — Encounter (HOSPITAL_COMMUNITY): Payer: Managed Care, Other (non HMO)

## 2013-12-04 ENCOUNTER — Ambulatory Visit: Payer: Managed Care, Other (non HMO) | Admitting: Cardiology

## 2013-12-04 ENCOUNTER — Other Ambulatory Visit: Payer: Self-pay

## 2013-12-04 ENCOUNTER — Other Ambulatory Visit: Payer: Self-pay | Admitting: *Deleted

## 2013-12-04 ENCOUNTER — Ambulatory Visit (HOSPITAL_COMMUNITY): Payer: Managed Care, Other (non HMO)

## 2013-12-04 DIAGNOSIS — I2589 Other forms of chronic ischemic heart disease: Secondary | ICD-10-CM

## 2013-12-04 MED ORDER — FUROSEMIDE 40 MG PO TABS: 40.0000 mg | ORAL_TABLET | Freq: Every day | ORAL | Status: DC

## 2013-12-04 MED ORDER — OMEPRAZOLE 20 MG PO CPDR: 20.0000 mg | DELAYED_RELEASE_CAPSULE | Freq: Three times a day (TID) | ORAL | Status: DC

## 2013-12-04 MED ORDER — HYDRALAZINE HCL 25 MG PO TABS: 25.0000 mg | ORAL_TABLET | Freq: Three times a day (TID) | ORAL | Status: DC

## 2013-12-04 MED ORDER — BISOPROLOL FUMARATE 5 MG PO TABS: 5.0000 mg | ORAL_TABLET | Freq: Two times a day (BID) | ORAL | Status: DC

## 2013-12-04 MED ORDER — NITROGLYCERIN 0.4 MG SL SUBL: 0.4000 mg | SUBLINGUAL_TABLET | SUBLINGUAL | Status: DC | PRN

## 2013-12-04 MED ORDER — ISOSORBIDE MONONITRATE ER 30 MG PO TB24: 30.0000 mg | ORAL_TABLET | Freq: Every day | ORAL | Status: DC

## 2013-12-04 MED ORDER — ATORVASTATIN CALCIUM 80 MG PO TABS: 80.0000 mg | ORAL_TABLET | Freq: Every day | ORAL | Status: DC

## 2013-12-04 NOTE — Progress Notes (Signed)
I reviewed d/c instructions with pt and wife. No further questions. Pt refuses to wear sling, education done. Pt D/c'd via wheelchair to private vehicle

## 2013-12-07 DIAGNOSIS — I5022 Chronic systolic (congestive) heart failure: Secondary | ICD-10-CM | POA: Insufficient documentation

## 2013-12-07 NOTE — Addendum Note (Signed)
Encounter addended by: Dolores Pattyaniel R Asako Saliba, MD on: 12/07/2013  4:23 PM<BR>     Documentation filed: Follow-up Section, LOS Section, Visit Diagnoses, Problem List, Notes Section

## 2013-12-08 ENCOUNTER — Ambulatory Visit (INDEPENDENT_AMBULATORY_CARE_PROVIDER_SITE_OTHER): Payer: Managed Care, Other (non HMO) | Admitting: *Deleted

## 2013-12-08 DIAGNOSIS — I10 Essential (primary) hypertension: Secondary | ICD-10-CM

## 2013-12-08 DIAGNOSIS — E7800 Pure hypercholesterolemia, unspecified: Secondary | ICD-10-CM

## 2013-12-08 LAB — BASIC METABOLIC PANEL
BUN: 23 mg/dL (ref 6–23)
CO2: 27 mEq/L (ref 19–32)
CREATININE: 1.7 mg/dL — AB (ref 0.4–1.5)
Calcium: 8.7 mg/dL (ref 8.4–10.5)
Chloride: 105 mEq/L (ref 96–112)
GFR: 42.18 mL/min — AB (ref >60.00)
Glucose, Bld: 139 mg/dL — ABNORMAL HIGH (ref 70–99)
Potassium: 3.9 mEq/L (ref 3.5–5.1)
Sodium: 140 mEq/L (ref 135–145)

## 2013-12-09 DEATH — deceased

## 2013-12-14 ENCOUNTER — Ambulatory Visit (INDEPENDENT_AMBULATORY_CARE_PROVIDER_SITE_OTHER): Payer: Managed Care, Other (non HMO) | Admitting: *Deleted

## 2013-12-14 DIAGNOSIS — I472 Ventricular tachycardia, unspecified: Secondary | ICD-10-CM

## 2013-12-14 DIAGNOSIS — I4729 Other ventricular tachycardia: Secondary | ICD-10-CM

## 2013-12-14 DIAGNOSIS — I255 Ischemic cardiomyopathy: Secondary | ICD-10-CM

## 2013-12-14 DIAGNOSIS — I5022 Chronic systolic (congestive) heart failure: Secondary | ICD-10-CM

## 2013-12-14 DIAGNOSIS — I2589 Other forms of chronic ischemic heart disease: Secondary | ICD-10-CM

## 2013-12-14 LAB — MDC_IDC_ENUM_SESS_TYPE_INCLINIC
Battery Remaining Longevity: 104.4 mo
HIGH POWER IMPEDANCE MEASURED VALUE: 48.375
HighPow Impedance: 48 Ohm
Implantable Pulse Generator Model: 2357
Implantable Pulse Generator Serial Number: 7175865
Lead Channel Impedance Value: 475 Ohm
Lead Channel Pacing Threshold Amplitude: 0.75 V
Lead Channel Sensing Intrinsic Amplitude: 2.4 mV
Lead Channel Setting Pacing Amplitude: 3.5 V
Lead Channel Setting Sensing Sensitivity: 0.5 mV
MDC IDC MSMT LEADCHNL RA IMPEDANCE VALUE: 400 Ohm
MDC IDC MSMT LEADCHNL RV PACING THRESHOLD AMPLITUDE: 0.75 V
MDC IDC MSMT LEADCHNL RV PACING THRESHOLD PULSEWIDTH: 0.5 ms
MDC IDC MSMT LEADCHNL RV PACING THRESHOLD PULSEWIDTH: 0.5 ms
MDC IDC MSMT LEADCHNL RV SENSING INTR AMPL: 11.7 mV
MDC IDC SESS DTM: 20150406105625
MDC IDC SET LEADCHNL RV PACING AMPLITUDE: 3.5 V
MDC IDC SET LEADCHNL RV PACING PULSEWIDTH: 0.5 ms
MDC IDC STAT BRADY RA PERCENT PACED: 0 %
MDC IDC STAT BRADY RV PERCENT PACED: 1.5 %
Zone Setting Detection Interval: 250 ms
Zone Setting Detection Interval: 300 ms
Zone Setting Detection Interval: 350 ms

## 2013-12-14 NOTE — Progress Notes (Signed)
Wound check appointment. Steri-strips removed. Wound without redness or edema. Incision edges approximated, wound well healed. Normal device function. Thresholds, sensing, and impedances consistent with implant measurements. Device programmed at 3.5V for extra safety margin until 3 month visit. Histogram distribution appropriate for patient and level of activity. No ventricular arrhythmias noted.   12/05/13 Patient converted to atrial tachy with atrial rates 170bpm, - coumadin.  Patient educated about wound care, arm mobility, lifting restrictions, shock plan. ROV in 3 months with implanting physician.  ROV 12/18/13 with Dr. [name] to evaluate atrial arrhythmia per Dr. Graciela HusbandsKlein.

## 2013-12-15 ENCOUNTER — Ambulatory Visit: Payer: Managed Care, Other (non HMO)

## 2013-12-18 ENCOUNTER — Ambulatory Visit (INDEPENDENT_AMBULATORY_CARE_PROVIDER_SITE_OTHER): Payer: Managed Care, Other (non HMO) | Admitting: Internal Medicine

## 2013-12-18 ENCOUNTER — Encounter: Payer: Self-pay | Admitting: Internal Medicine

## 2013-12-18 VITALS — BP 130/74 | HR 85 | Ht 73.0 in | Wt 224.0 lb

## 2013-12-18 DIAGNOSIS — I2589 Other forms of chronic ischemic heart disease: Secondary | ICD-10-CM

## 2013-12-18 DIAGNOSIS — I472 Ventricular tachycardia, unspecified: Secondary | ICD-10-CM

## 2013-12-18 DIAGNOSIS — I498 Other specified cardiac arrhythmias: Secondary | ICD-10-CM

## 2013-12-18 DIAGNOSIS — I4729 Other ventricular tachycardia: Secondary | ICD-10-CM

## 2013-12-18 DIAGNOSIS — I2510 Atherosclerotic heart disease of native coronary artery without angina pectoris: Secondary | ICD-10-CM

## 2013-12-18 DIAGNOSIS — I471 Supraventricular tachycardia: Secondary | ICD-10-CM

## 2013-12-18 DIAGNOSIS — I255 Ischemic cardiomyopathy: Secondary | ICD-10-CM

## 2013-12-18 DIAGNOSIS — I5022 Chronic systolic (congestive) heart failure: Secondary | ICD-10-CM

## 2013-12-18 DIAGNOSIS — I469 Cardiac arrest, cause unspecified: Secondary | ICD-10-CM

## 2013-12-18 DIAGNOSIS — I10 Essential (primary) hypertension: Secondary | ICD-10-CM

## 2013-12-18 DIAGNOSIS — I509 Heart failure, unspecified: Secondary | ICD-10-CM

## 2013-12-18 LAB — MDC_IDC_ENUM_SESS_TYPE_INCLINIC
Battery Remaining Longevity: 78 mo
Brady Statistic RV Percent Paced: 0.09 %
HighPow Impedance: 48.7348
Implantable Pulse Generator Model: 2357
Lead Channel Pacing Threshold Amplitude: 1 V
Lead Channel Pacing Threshold Pulse Width: 0.4 ms
Lead Channel Sensing Intrinsic Amplitude: 12 mV
Lead Channel Sensing Intrinsic Amplitude: 5 mV
Lead Channel Setting Pacing Amplitude: 2.5 V
Lead Channel Setting Pacing Pulse Width: 0.4 ms
MDC IDC MSMT LEADCHNL RV IMPEDANCE VALUE: 525 Ohm
MDC IDC PG SERIAL: 754590
MDC IDC SESS DTM: 20150410113700
MDC IDC SET LEADCHNL RV SENSING SENSITIVITY: 0.5 mV
MDC IDC SET ZONE DETECTION INTERVAL: 280 ms
MDC IDC SET ZONE DETECTION INTERVAL: 350 ms

## 2013-12-18 MED ORDER — APIXABAN 5 MG PO TABS: 5.0000 mg | ORAL_TABLET | Freq: Two times a day (BID) | ORAL | Status: DC

## 2013-12-18 NOTE — Patient Instructions (Addendum)
Your physician recommends that you schedule a follow-up appointments as scheduled  Your physician has recommended you make the following change in your medication:  1) Start Eliquis 5mg  twice daily

## 2013-12-20 ENCOUNTER — Encounter: Payer: Self-pay | Admitting: Internal Medicine

## 2013-12-20 DIAGNOSIS — I471 Supraventricular tachycardia: Secondary | ICD-10-CM | POA: Insufficient documentation

## 2013-12-20 NOTE — Progress Notes (Signed)
PCP: Lonnie RuthsMCGOUGH,Lonnie M, MD Primary Cardiologist:  Lonnie [name] is a 68 y.o. male who presents today for electrophysiology followup.  Since having his ICD implanted, the patient reports doing very well.  At recent wound check, he was found to have an atrial tachycardia.  He was asymptomatic with this.  He was scheduled to follow-up with me for further management.  Today, he denies symptoms of palpitations, chest pain, shortness of breath,  lower extremity edema, dizziness, presyncope, syncope, or ICD shocks.  The patient is otherwise without complaint today.   Past Medical History  Diagnosis Date  . High cholesterol   . Hypertension   . Constipation   . Reflux   . Complication of anesthesia     "Hard time waking me up" after sedation after dental procedure  . CAD (coronary artery disease)     LAD 95% proximal stenosis, D1 50-60% stenosis, the circumflex 40% stenosis, RCA subtotal stenosis.  . Cardiomyopathy, ischemic     EF was 30-35% by echo but 45-50% by cath  . Mitral regurgitation     Secondary to papillary muscle rupture  . H/O cardiac arrest   . Atrial tachycardia    Past Surgical History  Procedure Laterality Date  . Stomach ulcer repair    . Tee without cardioversion N/A 02/04/2013    Procedure: TRANSESOPHAGEAL ECHOCARDIOGRAM (TEE);  Surgeon: Lonnie MixerPhilip J Nahser, MD;  [facility];  Service: Cardiovascular;  Laterality: N/A;  . Intraoperative transesophageal echocardiogram N/A 02/05/2013    Procedure: INTRAOPERATIVE TRANSESOPHAGEAL ECHOCARDIOGRAM;  Surgeon: Lonnie SlotSteven C Hendrickson, MD;  [facility];  Service: Open Heart Surgery;  Laterality: N/A;  . Endovein harvest of greater saphenous vein Right 02/05/2013    Procedure: ENDOVEIN HARVEST OF GREATER SAPHENOUS VEIN;  Surgeon: Lonnie SlotSteven C Hendrickson, MD;  [name];  Service: Open Heart Surgery;  Laterality: Right;  . Patent foramen ovale closure N/A 02/05/2013    Procedure: PATENT FORAMEN OVALE CLOSURE;   Surgeon: Lonnie SlotSteven C Hendrickson, MD;  [facility];  Service: Open Heart Surgery;  Laterality: N/A;  . Mitral valve replacement (mvr)/coronary artery bypass grafting (cabg) N/A 02/05/2013    Procedure: MITRAL VALVE REPLACEMENT (MVR)/CORONARY ARTERY BYPASS GRAFTING (CABG);  Surgeon: Lonnie SlotSteven C Hendrickson, MD;  [facility];  Service: Open Heart Surgery;  Laterality: N/A;  x3 using right greater saphenous vein and left internal mammary.   . Coronary artery bypass graft    . Implantable cardioverter defibrillator implant  12/03/2013    STJ Fortify ICD implanted for secondary prevention by Lonnie [name]    Current Outpatient Prescriptions  Medication Sig Dispense Refill  . aspirin EC 81 MG EC tablet Take 1 tablet (81 mg total) by mouth daily.      . Aspirin-Acetaminophen-Caffeine (EXCEDRIN MIGRAINE PO) Take by mouth as needed (migraines).       Lonnie [name]. atorvastatin (LIPITOR) 80 MG tablet Place 1 tablet (80 mg total) into feeding tube daily at 6 PM.  30 tablet  6  . bisoprolol (ZEBETA) 5 MG tablet Take 1 tablet (5 mg total) by mouth 2 (two) times daily.  60 tablet  6  . furosemide (LASIX) 40 MG tablet Take 1 tablet (40 mg total) by mouth daily.  30 tablet  6  . hydrALAZINE (APRESOLINE) 25 MG tablet Take 1 tablet (25 mg total) by mouth every 8 (eight) hours.  90 tablet  6  . isosorbide mononitrate (IMDUR) 30 MG 24 hr tablet Take 1 tablet (30 mg total) by mouth daily.  30 tablet  6  . nitroGLYCERIN (NITROSTAT) 0.4 MG SL tablet Place 1 tablet (0.4 mg total) under the tongue every 5 (five) minutes as needed for chest pain.  25 tablet  3  . omeprazole (PRILOSEC) 20 MG capsule Take 1 capsule (20 mg total) by mouth 3 (three) times daily before meals.  30 capsule  6  . apixaban (ELIQUIS) 5 MG TABS tablet Take 1 tablet (5 mg total) by mouth 2 (two) times daily.  60 tablet  0   No current facility-administered medications for this visit.    Physical Exam: Filed Vitals:   12/18/13 0955  BP: 130/74  Pulse: 85   Height: 6\' 1"  (1.854 [name])  Weight: 224 lb (101.606 kg)    GEN- The patient is well appearing, alert and oriented x 3 today.   Head- normocephalic, atraumatic Eyes-  Sclera clear, conjunctiva pink Ears- hearing intact Oropharynx- clear Lungs- Clear to ausculation bilaterally, normal work of breathing Chest- ICD pocket is well healed Heart- irregular rate and rhythm, no murmurs, rubs or gallops, PMI not laterally displaced GI- soft, NT, ND, + BS Extremities- no clubbing, cyanosis, or edema  ICD interrogation- reviewed in detail today,  See PACEART report  Assessment and Plan:  1.  S/p VF arrest/ischemic CM/ chronic systolic dysfunction euvolemic today Stable on an appropriate medical regimen Normal ICD function See Pace Art report No changes today No driving for 6 months (patient aware)  2. Atrial tachycardia He presents with an atrial tachycardia with CL 370 msec. Today, I tried to terminate this tachycardia with burst atrial pacing at multiple cycle lengths ranging from to 380 msec without success.  I will initiate anticoagulation with eliquis 5mg  BID.  He we return in 4 weeks as scheduled to see Lonnie [name].  IF he remains in atrial tachycardia at that time, then I would advise cardioversion.  This can be arranged by Lonnie [name]. The patient is relatively asymptomatic.  Though rate control could be considered long term, I think that it is worth pursuing sinus rhythm initially.  3. HTN Stable No change required today  Follow-up with Lonnie [name] in 4 weeks I will see in 3 months We will enroll in the [name] device clinic for Lonnie [name] management long term

## 2013-12-28 ENCOUNTER — Encounter: Payer: Self-pay | Admitting: Internal Medicine

## 2013-12-31 ENCOUNTER — Telehealth: Payer: Self-pay | Admitting: Cardiology

## 2013-12-31 NOTE — Telephone Encounter (Signed)
FMLA Mailed to Pt Home Address, per His request

## 2014-01-07 ENCOUNTER — Telehealth: Payer: Self-pay | Admitting: Internal Medicine

## 2014-01-07 NOTE — Telephone Encounter (Signed)
I [facility]. I stated he could call me back today, or I will try him back on Monday.

## 2014-01-18 NOTE — Telephone Encounter (Signed)
Patient seeing Dr. Antoine PocheHochrein on 5/14. I will speak with him about ICM clinic then.

## 2014-01-19 ENCOUNTER — Other Ambulatory Visit: Payer: Self-pay | Admitting: Internal Medicine

## 2014-01-21 ENCOUNTER — Ambulatory Visit (INDEPENDENT_AMBULATORY_CARE_PROVIDER_SITE_OTHER): Payer: Managed Care, Other (non HMO) | Admitting: *Deleted

## 2014-01-21 ENCOUNTER — Ambulatory Visit (INDEPENDENT_AMBULATORY_CARE_PROVIDER_SITE_OTHER): Payer: Managed Care, Other (non HMO) | Admitting: Cardiology

## 2014-01-21 ENCOUNTER — Encounter: Payer: Self-pay | Admitting: *Deleted

## 2014-01-21 ENCOUNTER — Encounter: Payer: Self-pay | Admitting: Cardiology

## 2014-01-21 ENCOUNTER — Encounter (HOSPITAL_COMMUNITY): Payer: Self-pay | Admitting: Pharmacy Technician

## 2014-01-21 VITALS — BP 116/68 | HR 72 | Ht 73.0 in | Wt 221.8 lb

## 2014-01-21 DIAGNOSIS — I471 Supraventricular tachycardia: Secondary | ICD-10-CM

## 2014-01-21 DIAGNOSIS — I469 Cardiac arrest, cause unspecified: Secondary | ICD-10-CM

## 2014-01-21 DIAGNOSIS — I498 Other specified cardiac arrhythmias: Secondary | ICD-10-CM

## 2014-01-21 DIAGNOSIS — I472 Ventricular tachycardia, unspecified: Secondary | ICD-10-CM

## 2014-01-21 DIAGNOSIS — I2510 Atherosclerotic heart disease of native coronary artery without angina pectoris: Secondary | ICD-10-CM

## 2014-01-21 DIAGNOSIS — I255 Ischemic cardiomyopathy: Secondary | ICD-10-CM

## 2014-01-21 DIAGNOSIS — I2589 Other forms of chronic ischemic heart disease: Secondary | ICD-10-CM

## 2014-01-21 DIAGNOSIS — I4729 Other ventricular tachycardia: Secondary | ICD-10-CM

## 2014-01-21 DIAGNOSIS — I5022 Chronic systolic (congestive) heart failure: Secondary | ICD-10-CM

## 2014-01-21 DIAGNOSIS — Z79899 Other long term (current) drug therapy: Secondary | ICD-10-CM

## 2014-01-21 DIAGNOSIS — I509 Heart failure, unspecified: Secondary | ICD-10-CM

## 2014-01-21 LAB — MDC_IDC_ENUM_SESS_TYPE_INCLINIC
Battery Remaining Percentage: 95 %
Brady Statistic RA Percent Paced: 0 %
HIGH POWER IMPEDANCE MEASURED VALUE: 54 Ohm
Lead Channel Impedance Value: 450 Ohm
Lead Channel Impedance Value: 550 Ohm
Lead Channel Setting Pacing Amplitude: 3.5 V
Lead Channel Setting Pacing Pulse Width: 0.5 ms
MDC IDC PG MODEL: 2357
MDC IDC PG SERIAL: 754590
MDC IDC SET LEADCHNL RA PACING AMPLITUDE: 3.5 V
MDC IDC SET LEADCHNL RV SENSING SENSITIVITY: 0.5 mV
MDC IDC SET ZONE DETECTION INTERVAL: 350 ms
MDC IDC STAT BRADY RV PERCENT PACED: 17 %
Zone Setting Detection Interval: 250 ms
Zone Setting Detection Interval: 300 ms

## 2014-01-21 LAB — BASIC METABOLIC PANEL
BUN: 27 mg/dL — ABNORMAL HIGH (ref 6–23)
CHLORIDE: 107 meq/L (ref 96–112)
CO2: 30 mEq/L (ref 19–32)
Calcium: 9.1 mg/dL (ref 8.4–10.5)
Creatinine, Ser: 1.8 mg/dL — ABNORMAL HIGH (ref 0.4–1.5)
GFR: 40.53 mL/min — ABNORMAL LOW (ref >60.00)
Glucose, Bld: 93 mg/dL (ref 70–99)
POTASSIUM: 3.9 meq/L (ref 3.5–5.1)
Sodium: 142 mEq/L (ref 135–145)

## 2014-01-21 NOTE — Progress Notes (Signed)
HPI The patient presents for evaluation after CABG and mitral valve replacement. He presented late after myocardial infarction. 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 He ultimately required CABG with bioprosthetic mitral valve replacement. He had a resultant EF of 30-35%.  We had discussed an ICD and he was considering this.   He suffered cardiac arrest on 2/22 with VT/VF.  Follow up cath demonstrated patent bypass grafts.  He initially went home with a Life Vest.  He subsequently returned for ICD placement on 12/03/13.  The most recent EF at the time of the arrest was 15%.  When he saw Dr. [name] in follow up recently he was in atrial tachycardia and was started on Eliquis with plans for possible cardioversion if he remains in this rhythm.    No Known Allergies  Current Outpatient Prescriptions  Medication Sig Dispense Refill  . aspirin EC 81 MG EC tablet Take 1 tablet (81 mg total) by mouth daily.      . Aspirin-Acetaminophen-Caffeine (EXCEDRIN MIGRAINE PO) Take by mouth as needed (migraines).       Marland Kitchen. atorvastatin (LIPITOR) 80 MG tablet Place 1 tablet (80 mg total) into feeding tube daily at 6 PM.  30 tablet  6  . bisoprolol (ZEBETA) 5 MG tablet Take 1 tablet (5 mg total) by mouth 2 (two) times daily.  60 tablet  6  . ELIQUIS 5 MG TABS tablet TAKE ONE TABLET BY MOUTH TWICE DAILY  60 tablet  0  . furosemide (LASIX) 40 MG tablet Take 1 tablet (40 mg total) by mouth daily.  30 tablet  6  . hydrALAZINE (APRESOLINE) 25 MG tablet Take 1 tablet (25 mg total) by mouth every 8 (eight) hours.  90 tablet  6  . isosorbide mononitrate (IMDUR) 30 MG 24 hr tablet Take 1 tablet (30 mg total) by mouth daily.  30 tablet  6  . nitroGLYCERIN (NITROSTAT) 0.4 MG SL tablet Place 1 tablet (0.4 mg total) under the tongue every 5 (five) minutes as needed for chest pain.  25 tablet  3  . omeprazole (PRILOSEC) 20 MG capsule Take 1 capsule (20 mg total) by mouth 3 (three) times daily before  meals.  30 capsule  6   No current facility-administered medications for this visit.    Past Medical History  Diagnosis Date  . High cholesterol   . Hypertension   . Constipation   . Reflux   . Complication of anesthesia     "Hard time waking me up" after sedation after dental procedure  . CAD (coronary artery disease)     LAD 95% proximal stenosis, D1 50-60% stenosis, the circumflex 40% stenosis, RCA subtotal stenosis.  . Cardiomyopathy, ischemic     EF was 30-35% by echo but 45-50% by cath.  Most recent EF 15%.    . Mitral regurgitation     Secondary to papillary muscle rupture  . H/O cardiac arrest   . Atrial tachycardia     Past Surgical History  Procedure Laterality Date  . Stomach ulcer repair    . Tee without cardioversion N/A 02/04/2013    Procedure: TRANSESOPHAGEAL ECHOCARDIOGRAM (TEE);  Surgeon: Vesta MixerPhilip J Nahser, MD;  [facility];  Service: Cardiovascular;  Laterality: N/A;  . Intraoperative transesophageal echocardiogram N/A 02/05/2013    Procedure: INTRAOPERATIVE TRANSESOPHAGEAL ECHOCARDIOGRAM;  Surgeon: Loreli SlotSteven C Hendrickson, MD;  [facility];  Service: Open Heart Surgery;  Laterality: N/A;  . Endovein harvest of greater saphenous vein Right 02/05/2013  Procedure: ENDOVEIN HARVEST OF GREATER SAPHENOUS VEIN;  Surgeon: Steven C Hendrickson, MD;  Location: [facility] OR;  Service: Open Heart Surgery;  Laterality: Right;  . Patent foramen ovale closure N/A 02/05/2013    Procedure: PATENT FORAMEN OVALE CLOSURE;  Surgeon: Steven C Hendrickson, MD;  Location: [facility] OR;  Service: Open Heart Surgery;  Laterality: N/A;  . Mitral valve replacement (mvr)/coronary artery bypass grafting (cabg) N/A 02/05/2013    Procedure: MITRAL VALVE REPLACEMENT (MVR)/CORONARY ARTERY BYPASS GRAFTING (CABG);  Surgeon: Steven C Hendrickson, MD;  Location: [facility] OR;  Service: Open Heart Surgery;  Laterality: N/A;  x3 using right greater saphenous vein and left internal mammary.   . Coronary artery  bypass graft    . Implantable cardioverter defibrillator implant  12/03/2013    STJ Fortify ICD implanted for secondary prevention by Dr Allred    ROS:  As stated in the HPI and negative for all other systems.   PHYSICAL EXAM BP 116/68  Pulse 72  Ht 6' 1" (1.854 m)  Wt 221 lb 12.8 oz (100.608 kg)  BMI 29.27 kg/m2 GENERAL:  Well appearing NECK:  No JVD, waveform within normal limits, carotid upstroke brisk and symmetric, no bruits, no thyromegaly LUNGS:  Slightly diminished breath sounds at the bases BACK:  No CVA tenderness CHEST:  Well healed sternotomy scar, well healed ICD scar.  HEART:  PMI not displaced or sustained,S1 and S2 within normal limits, no S3, no S4, no clicks, no rubs, no murmurs ABD:  Flat, positive bowel sounds normal in frequency in pitch, no bruits, no rebound, no guarding, no midline pulsatile mass, no hepatomegaly, no splenomegaly EXT:  2 plus pulses throughout, no lower extremity edema, no cyanosis no clubbing  EKG:  Atrial tachycardia with ventricular pacing 100% caputre.  01/21/2014   ASSESSMENT AND PLAN  CARDIOMYOPATHY:  At this point I will defer med titration until I see him back. We are avoiding ACE inhibitors because of renal insufficiency. He will get a basic metabolic profile today. I would like to titrate medications based on blood pressures going forward. I will repeat an echo probably around August..  MVR: He has stable valve prosthesis. No change in therapy is suggested.  CAD:  The patient is having no symptoms. No further cardiovascular testing is suggested. He will continue with risk reduction.  ATRIAL TACHYCARDIA:  He will continue his Eliquis.  I will schedule a DCCV.  I had his device interrogated.  He is still in this rhythm.   ICD:  He is up to date and will continue to follow up.        

## 2014-01-21 NOTE — Patient Instructions (Addendum)
The current medical regimen is effective;  continue present plan and medications.  Your physician has requested that you have a Cardioversion.   Electrical Cardioversion uses a jolt of electricity to your heart either through paddles or wired patches attached to your chest. This is a controlled, usually prescheduled, procedure. This procedure is done at the hospital and you are not awake during the procedure. You usually go home the day of the procedure. Please see the instruction sheet given to you today for more information.  Your physician has requested that you have an echocardiogram. Echocardiography is a painless test that uses sound waves to create images of your heart. It provides your doctor with information about the size and shape of your heart and how well your heart's chambers and valves are working. This procedure takes approximately one hour. There are no restrictions for this procedure.  Please have blood work today (BMP)  Follow up in 1 month with Dr Antoine PocheHochrein.

## 2014-01-25 ENCOUNTER — Ambulatory Visit (HOSPITAL_COMMUNITY): Payer: Managed Care, Other (non HMO) | Admitting: Anesthesiology

## 2014-01-25 ENCOUNTER — Encounter (HOSPITAL_COMMUNITY): Payer: Managed Care, Other (non HMO) | Admitting: Anesthesiology

## 2014-01-25 ENCOUNTER — Other Ambulatory Visit: Payer: Self-pay | Admitting: *Deleted

## 2014-01-25 ENCOUNTER — Ambulatory Visit (HOSPITAL_COMMUNITY)
Admission: RE | Admit: 2014-01-25 | Discharge: 2014-01-25 | Disposition: A | Discharge status: Home / Self Care | Payer: Managed Care, Other (non HMO) | Source: Ambulatory Visit | Attending: Cardiovascular Disease | Admitting: Cardiovascular Disease

## 2014-01-25 ENCOUNTER — Encounter (HOSPITAL_COMMUNITY)
Admission: RE | Disposition: A | Discharge status: Home / Self Care | Payer: Self-pay | Source: Ambulatory Visit | Attending: Cardiovascular Disease

## 2014-01-25 ENCOUNTER — Encounter (HOSPITAL_COMMUNITY): Payer: Self-pay

## 2014-01-25 DIAGNOSIS — I059 Rheumatic mitral valve disease, unspecified: Secondary | ICD-10-CM | POA: Insufficient documentation

## 2014-01-25 DIAGNOSIS — I2510 Atherosclerotic heart disease of native coronary artery without angina pectoris: Secondary | ICD-10-CM | POA: Insufficient documentation

## 2014-01-25 DIAGNOSIS — I252 Old myocardial infarction: Secondary | ICD-10-CM | POA: Insufficient documentation

## 2014-01-25 DIAGNOSIS — K5900 Constipation, unspecified: Secondary | ICD-10-CM | POA: Insufficient documentation

## 2014-01-25 DIAGNOSIS — Z79899 Other long term (current) drug therapy: Secondary | ICD-10-CM | POA: Insufficient documentation

## 2014-01-25 DIAGNOSIS — I10 Essential (primary) hypertension: Secondary | ICD-10-CM | POA: Insufficient documentation

## 2014-01-25 DIAGNOSIS — I4891 Unspecified atrial fibrillation: Secondary | ICD-10-CM

## 2014-01-25 DIAGNOSIS — I2589 Other forms of chronic ischemic heart disease: Secondary | ICD-10-CM | POA: Insufficient documentation

## 2014-01-25 DIAGNOSIS — Z951 Presence of aortocoronary bypass graft: Secondary | ICD-10-CM | POA: Insufficient documentation

## 2014-01-25 DIAGNOSIS — Z7982 Long term (current) use of aspirin: Secondary | ICD-10-CM | POA: Insufficient documentation

## 2014-01-25 HISTORY — PX: CARDIOVERSION: SHX1299

## 2014-01-25 SURGERY — CARDIOVERSION
Anesthesia: General

## 2014-01-25 MED ORDER — LIDOCAINE HCL (CARDIAC) 20 MG/ML IV SOLN
INTRAVENOUS | Status: DC | PRN
  Administered 2014-01-25: 60 mg via INTRAVENOUS

## 2014-01-25 MED ORDER — SODIUM CHLORIDE 0.9 % IJ SOLN: 3.0000 mL | Freq: Two times a day (BID) | INTRAMUSCULAR | Status: DC

## 2014-01-25 MED ORDER — PROPOFOL 10 MG/ML IV BOLUS
INTRAVENOUS | Status: DC | PRN
  Administered 2014-01-25: 50 mg via INTRAVENOUS

## 2014-01-25 MED ORDER — SODIUM CHLORIDE 0.9 % IV SOLN
INTRAVENOUS | Status: DC
Start: 2014-01-25 — End: 2014-01-25
  Administered 2014-01-25: 09:00:00 via INTRAVENOUS

## 2014-01-25 MED ORDER — SODIUM CHLORIDE 0.9 % IV SOLN: 250.0000 mL | INTRAVENOUS | Status: DC

## 2014-01-25 MED ORDER — SODIUM CHLORIDE 0.9 % IV SOLN
INTRAVENOUS | Status: DC | PRN
  Administered 2014-01-25: 11:00:00 via INTRAVENOUS

## 2014-01-25 MED ORDER — SODIUM CHLORIDE 0.9 % IV SOLN: INTRAVENOUS | Status: DC

## 2014-01-25 MED ORDER — SODIUM CHLORIDE 0.9 % IJ SOLN: 3.0000 mL | INTRAMUSCULAR | Status: DC | PRN

## 2014-01-25 NOTE — H&P (View-Only) (Signed)
HPI The patient presents for evaluation after CABG and mitral valve replacement. He presented late after myocardial infarction. 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 He ultimately required CABG with bioprosthetic mitral valve replacement. He had a resultant EF of 30-35%.  We had discussed an ICD and he was considering this.   He suffered cardiac arrest on 2/22 with VT/VF.  Follow up cath demonstrated patent bypass grafts.  He initially went home with a Life Vest.  He subsequently returned for ICD placement on 12/03/13.  The most recent EF at the time of the arrest was 15%.  When he saw Dr. [name] in follow up recently he was in atrial tachycardia and was started on Eliquis with plans for possible cardioversion if he remains in this rhythm.    No Known Allergies  Current Outpatient Prescriptions  Medication Sig Dispense Refill  . aspirin EC 81 MG EC tablet Take 1 tablet (81 mg total) by mouth daily.      . Aspirin-Acetaminophen-Caffeine (EXCEDRIN MIGRAINE PO) Take by mouth as needed (migraines).       Marland Kitchen. atorvastatin (LIPITOR) 80 MG tablet Place 1 tablet (80 mg total) into feeding tube daily at 6 PM.  30 tablet  6  . bisoprolol (ZEBETA) 5 MG tablet Take 1 tablet (5 mg total) by mouth 2 (two) times daily.  60 tablet  6  . ELIQUIS 5 MG TABS tablet TAKE ONE TABLET BY MOUTH TWICE DAILY  60 tablet  0  . furosemide (LASIX) 40 MG tablet Take 1 tablet (40 mg total) by mouth daily.  30 tablet  6  . hydrALAZINE (APRESOLINE) 25 MG tablet Take 1 tablet (25 mg total) by mouth every 8 (eight) hours.  90 tablet  6  . isosorbide mononitrate (IMDUR) 30 MG 24 hr tablet Take 1 tablet (30 mg total) by mouth daily.  30 tablet  6  . nitroGLYCERIN (NITROSTAT) 0.4 MG SL tablet Place 1 tablet (0.4 mg total) under the tongue every 5 (five) minutes as needed for chest pain.  25 tablet  3  . omeprazole (PRILOSEC) 20 MG capsule Take 1 capsule (20 mg total) by mouth 3 (three) times daily before  meals.  30 capsule  6   No current facility-administered medications for this visit.    Past Medical History  Diagnosis Date  . High cholesterol   . Hypertension   . Constipation   . Reflux   . Complication of anesthesia     "Hard time waking me up" after sedation after dental procedure  . CAD (coronary artery disease)     LAD 95% proximal stenosis, D1 50-60% stenosis, the circumflex 40% stenosis, RCA subtotal stenosis.  . Cardiomyopathy, ischemic     EF was 30-35% by echo but 45-50% by cath.  Most recent EF 15%.    . Mitral regurgitation     Secondary to papillary muscle rupture  . H/O cardiac arrest   . Atrial tachycardia     Past Surgical History  Procedure Laterality Date  . Stomach ulcer repair    . Tee without cardioversion N/A 02/04/2013    Procedure: TRANSESOPHAGEAL ECHOCARDIOGRAM (TEE);  Surgeon: Vesta MixerPhilip J Nahser, MD;  [facility];  Service: Cardiovascular;  Laterality: N/A;  . Intraoperative transesophageal echocardiogram N/A 02/05/2013    Procedure: INTRAOPERATIVE TRANSESOPHAGEAL ECHOCARDIOGRAM;  Surgeon: Loreli SlotSteven C Hendrickson, MD;  [facility];  Service: Open Heart Surgery;  Laterality: N/A;  . Endovein harvest of greater saphenous vein Right 02/05/2013  Procedure: ENDOVEIN HARVEST OF GREATER SAPHENOUS VEIN;  Surgeon: Loreli SlotSteven C Hendrickson, MD;  [facility];  Service: Open Heart Surgery;  Laterality: Right;  . Patent foramen ovale closure N/A 02/05/2013    Procedure: PATENT FORAMEN OVALE CLOSURE;  Surgeon: Loreli SlotSteven C Hendrickson, MD;  [facility];  Service: Open Heart Surgery;  Laterality: N/A;  . Mitral valve replacement (mvr)/coronary artery bypass grafting (cabg) N/A 02/05/2013    Procedure: MITRAL VALVE REPLACEMENT (MVR)/CORONARY ARTERY BYPASS GRAFTING (CABG);  Surgeon: Loreli SlotSteven C Hendrickson, MD;  [facility];  Service: Open Heart Surgery;  Laterality: N/A;  x3 using right greater saphenous vein and left internal mammary.   . Coronary artery  bypass graft    . Implantable cardioverter defibrillator implant  12/03/2013    STJ Fortify ICD implanted for secondary prevention by Dr [name]    ROS:  As stated in the HPI and negative for all other systems.   PHYSICAL EXAM BP 116/68  Pulse 72  Ht 6\' 1"  (1.854 m)  Wt 221 lb 12.8 oz (100.608 kg)  BMI 29.27 kg/m2 GENERAL:  Well appearing NECK:  No JVD, waveform within normal limits, carotid upstroke brisk and symmetric, no bruits, no thyromegaly LUNGS:  Slightly diminished breath sounds at the bases BACK:  No CVA tenderness CHEST:  Well healed sternotomy scar, well healed ICD scar.  HEART:  PMI not displaced or sustained,S1 and S2 within normal limits, no S3, no S4, no clicks, no rubs, no murmurs ABD:  Flat, positive bowel sounds normal in frequency in pitch, no bruits, no rebound, no guarding, no midline pulsatile mass, no hepatomegaly, no splenomegaly EXT:  2 plus pulses throughout, no lower extremity edema, no cyanosis no clubbing  EKG:  Atrial tachycardia with ventricular pacing 100% caputre.  01/21/2014   ASSESSMENT AND PLAN  CARDIOMYOPATHY:  At this point I will defer med titration until I see him back. We are avoiding ACE inhibitors because of renal insufficiency. He will get a basic metabolic profile today. I would like to titrate medications based on blood pressures going forward. I will repeat an echo probably around August..  MVR: He has stable valve prosthesis. No change in therapy is suggested.  CAD:  The patient is having no symptoms. No further cardiovascular testing is suggested. He will continue with risk reduction.  ATRIAL TACHYCARDIA:  He will continue his Eliquis.  I will schedule a DCCV.  I had his device interrogated.  He is still in this rhythm.   ICD:  He is up to date and will continue to follow up.

## 2014-01-25 NOTE — Anesthesia Preprocedure Evaluation (Signed)
Anesthesia Evaluation  Patient identified by MRN, date of birth, ID band Patient awake and Patient confused    Reviewed: Allergy & Precautions, H&P , NPO status , Patient's Chart, lab work & pertinent test results  Airway       Dental   Pulmonary          Cardiovascular hypertension, + CAD, + Past MI, + CABG and +CHF + dysrhythmias Atrial Fibrillation + Cardiac Defibrillator + Valvular Problems/Murmurs MR     Neuro/Psych    GI/Hepatic   Endo/Other    Renal/GU Renal disease     Musculoskeletal   Abdominal   Peds  Hematology   Anesthesia Other Findings   Reproductive/Obstetrics                           Anesthesia Physical Anesthesia Plan  ASA: IV  Anesthesia Plan: General   Post-op Pain Management:    Induction: Intravenous  Airway Management Planned: Mask  Additional Equipment:   Intra-op Plan:   Post-operative Plan:   Informed Consent: I have reviewed the patients History and Physical, chart, labs and discussed the procedure including the risks, benefits and alternatives for the proposed anesthesia with the patient or authorized representative who has indicated his/her understanding and acceptance.     Plan Discussed with:   Anesthesia Plan Comments:         Anesthesia Quick Evaluation

## 2014-01-25 NOTE — Discharge Instructions (Addendum)
Electrical Cardioversion °Electrical cardioversion is the delivery of a jolt of electricity to change the rhythm of the heart. Sticky patches or metal paddles are placed on the chest to deliver the electricity from a device. This is done to restore a normal rhythm. A rhythm that is too fast or not regular keeps the heart from pumping well. °Electrical cardioversion is done in an emergency if:  °· There is low or no blood pressure as a result of the heart rhythm.   °· Normal rhythm must be restored as fast as possible to protect the brain and heart from further damage.   °· It may save a life. °Cardioversion may be done for heart rhythms that are not immediately life-threatening, such as atrial fibrillation or flutter, in which:  °· The heart is beating too fast or is not regular.   °· Medicine to change the rhythm has not worked.   °· It is safe to wait in order to allow time for preparation. °· Symptoms of the abnormal rhythm are bothersome. °· The risk of stroke and other serious complications can be reduced. °LET YOUR CAREGIVER KNOW ABOUT:  °· All medicines you are taking, including vitamins, herbs, eye drops, creams, and over-the-counter medicines.   °· Previous problems you or members of your family have had with the use of anesthetics.   °· Any blood disorders you have.   °· Previous surgeries you have had.   °· Medical conditions you have. °RISKS AND COMPLICATIONS  °Generally, this is a safe procedure. However, as with any procedure, complications can occur. Possible complications include:  °· Breathing problems related to the anesthetic used. °· Cardiac arrest This risk is rare. °· A blood clot that breaks free and travels to other parts of your body. This could cause a stroke or other problems. The risk of this is lowered by use of blood thinning medicine (anticoagulant) prior to the procedure. °BEFORE THE PROCEDURE  °· You may have tests to detect blood clots in your heart and evaluate heart  function.  °· You may start taking anticoagulants so your blood does not clot as easily.   °· Medicines may be given to help stabilize your heart rate and rhythm. °PROCEDURE °· You will be given medicine through an IV tube to reduce discomfort and make you sleepy (sedative).   °· An electrical shock will be delivered. °AFTER THE PROCEDURE °Your heart rhythm will be watched to make sure it does not change. You may be able to go home within a few hours.  °Document Released: 08/17/2002 Document Revised: 06/17/2013 Document Reviewed: 03/11/2013 °ExitCare® Patient Information ©2014 [employer]. ° ° °Monitored Anesthesia Care  °Monitored anesthesia care is an anesthesia service for a medical procedure. Anesthesia is the loss of the ability to feel pain. It is produced by medications called anesthetics. It may affect a small area of your body (local anesthesia), a large area of your body (regional anesthesia), or your entire body (general anesthesia). The need for monitored anesthesia care depends your procedure, your condition, and the potential need for regional or general anesthesia. It is often provided during procedures where:  °· General anesthesia may be needed if there are complications. This is because you need special care when you are under general anesthesia.   °· You will be under local or regional anesthesia. This is so that you are able to have higher levels of anesthesia if needed.   °· You will receive calming medications (sedatives). This is especially the case if sedatives are given to put you in a semi-conscious state of relaxation (  deep sedation). This is because the amount of sedative needed to produce this state can be hard to predict. Too much of a sedative can produce general anesthesia. °Monitored anesthesia care is performed by one or more caregivers who have special training in all types of anesthesia. You will need to meet with these caregivers before your procedure. During this meeting, they  will ask you about your medical history. They will also give you instructions to follow. (For example, you will need to stop eating and drinking before your procedure. You may also need to stop or change medications you are taking.) During your procedure, your caregivers will stay with you. They will:  °· Watch your condition. This includes watching you blood pressure, breathing, and level of pain.   °· Diagnose and treat problems that occur.   °· Give medications if they are needed. These may include calming medications (sedatives) and anesthetics.   °· Make sure you are comfortable.   °Having monitored anesthesia care does not necessarily mean that you will be under anesthesia. It does mean that your caregivers will be able to manage anesthesia if you need it or if it occurs. It also means that you will be able to have a different type of anesthesia than you are having if you need it. When your procedure is complete, your caregivers will continue to watch your condition. They will make sure any medications wear off before you are allowed to go home.  °Document Released: 05/23/2005 Document Revised: 12/22/2012 Document Reviewed: 10/08/2012 °ExitCare® Patient Information ©2014 [employer]. ° °

## 2014-01-25 NOTE — CV Procedure (Signed)
DCC:  Indication atrial fib/flutter Meds:  50 mg propofol 60 mg kidocaine  Initially tried to use AICD and pace patient out of organized flutter Unsuccessful  Patient had periods of afib Single 120J bipahsic shock delivered and converted to NSR Native rhythm with long PR Had St Jude rep set patient to AV synchronous pacing with PR 200 msec post cardioversin  On Rx eliquis with compliance  F/U Dr Antoine PocheHochrein and Allred

## 2014-01-25 NOTE — Transfer of Care (Signed)
Immediate Anesthesia Transfer of Care Note  Patient: Lonnie BerkshireRobert L [name]  Procedure(s) Performed: Procedure(s): CARDIOVERSION (N/A)  Patient Location: PACU and Endoscopy Unit  Anesthesia Type:General  Level of Consciousness: awake, alert  and oriented  Airway & Oxygen Therapy: Patient Spontanous Breathing  Post-op Assessment: Report given to PACU RN  Post vital signs: Reviewed and stable  Complications: No apparent anesthesia complications

## 2014-01-25 NOTE — Anesthesia Postprocedure Evaluation (Signed)
  Anesthesia Post-op Note  Patient: Lonnie BerkshireRobert L Adamski  Procedure(s) Performed: Procedure(s): CARDIOVERSION (N/A)  Patient Location: Endoscopy Unit  Anesthesia Type:General  Level of Consciousness: awake, alert  and oriented  Airway and Oxygen Therapy: Patient Spontanous Breathing  Post-op Pain: none  Post-op Assessment: Post-op Vital signs reviewed, Patient's Cardiovascular Status Stable, Respiratory Function Stable, Patent Airway, No signs of Nausea or vomiting, Pain level controlled, No headache, No backache, No residual numbness and No residual motor weakness  Post-op Vital Signs: Reviewed and stable  Last Vitals:  Filed Vitals:   01/25/14 1101  BP: 111/56  Temp:   Resp: 15    Complications: No apparent anesthesia complications

## 2014-01-25 NOTE — Interval H&P Note (Signed)
History and Physical Interval Note:  01/25/2014 9:19 AM  Lonnie Berkshireobert L Macapagal  has presented today for surgery, with the diagnosis of atrial tachycardia  The various methods of treatment have been discussed with the patient and family. After consideration of risks, benefits and other options for treatment, the patient has consented to  Procedure(s): CARDIOVERSION (N/A) as a surgical intervention .  The patient's history has been reviewed, patient examined, no change in status, stable for surgery.  I have reviewed the patient's chart and labs.  Questions were answered to the patient's satisfaction.     Wendall StadePeter C Nishan

## 2014-01-26 ENCOUNTER — Encounter (HOSPITAL_COMMUNITY): Payer: Self-pay | Admitting: Cardiovascular Disease

## 2014-01-26 NOTE — Progress Notes (Signed)
ICD check in clinic with Dr.Hochrein to assess AF burden. >99% AF burden---presently in AF + Eliquis/ASA. No high ventricular rates recorded. Testing was not performed this ov. Patient will follow up [facility] and F/U with JA as scheduled.

## 2014-01-29 ENCOUNTER — Encounter: Payer: Self-pay | Admitting: Internal Medicine

## 2014-02-16 DIAGNOSIS — I4901 Ventricular fibrillation: Secondary | ICD-10-CM

## 2014-02-16 HISTORY — DX: Ventricular fibrillation: I49.01

## 2014-02-17 ENCOUNTER — Encounter: Payer: Self-pay | Admitting: Internal Medicine

## 2014-02-17 ENCOUNTER — Telehealth: Payer: Self-pay | Admitting: *Deleted

## 2014-02-17 DIAGNOSIS — I472 Ventricular tachycardia, unspecified: Secondary | ICD-10-CM

## 2014-02-17 DIAGNOSIS — Z79899 Other long term (current) drug therapy: Secondary | ICD-10-CM

## 2014-02-17 MED ORDER — BISOPROLOL FUMARATE 10 MG PO TABS: 10.0000 mg | ORAL_TABLET | Freq: Every day | ORAL | Status: DC

## 2014-02-17 NOTE — Telephone Encounter (Signed)
Patient informed of ICD shock. He states that he was syncopal and was unaware that he received the shock. JA reviewed and asked that the pt increase the Bisoprolol to 10 mg BID. BMP+Mag ordered. Patient to F/U w/JA on 6-26 as scheduled. Patient and wife aware of driving restrictions.

## 2014-02-22 ENCOUNTER — Encounter: Payer: Self-pay | Admitting: Internal Medicine

## 2014-02-22 ENCOUNTER — Other Ambulatory Visit: Payer: Self-pay | Admitting: Internal Medicine

## 2014-02-22 ENCOUNTER — Other Ambulatory Visit: Payer: Managed Care, Other (non HMO)

## 2014-02-24 ENCOUNTER — Telehealth: Payer: Self-pay | Admitting: Internal Medicine

## 2014-02-24 MED ORDER — APIXABAN 5 MG PO TABS: 5.0000 mg | ORAL_TABLET | Freq: Two times a day (BID) | ORAL | Status: DC

## 2014-02-24 MED ORDER — BISOPROLOL FUMARATE 10 MG PO TABS: 10.0000 mg | ORAL_TABLET | Freq: Two times a day (BID) | ORAL | Status: DC

## 2014-02-24 NOTE — Telephone Encounter (Signed)
He is to be taking the Bisoprolol twice daily

## 2014-02-24 NOTE — Telephone Encounter (Signed)
New problem   Pt's wife have question about what the pt is suppose to be taking for medication Bisoprolol. She is confused and need to talk to nurse today. Please call.

## 2014-03-01 ENCOUNTER — Encounter: Payer: Self-pay | Admitting: *Deleted

## 2014-03-05 ENCOUNTER — Encounter: Payer: Managed Care, Other (non HMO) | Admitting: Internal Medicine

## 2014-03-08 ENCOUNTER — Encounter: Payer: Self-pay | Admitting: Internal Medicine

## 2014-03-08 ENCOUNTER — Ambulatory Visit (INDEPENDENT_AMBULATORY_CARE_PROVIDER_SITE_OTHER): Payer: Managed Care, Other (non HMO) | Admitting: Internal Medicine

## 2014-03-08 VITALS — BP 122/62 | HR 60 | Ht 73.0 in | Wt 227.0 lb

## 2014-03-08 DIAGNOSIS — I472 Ventricular tachycardia, unspecified: Secondary | ICD-10-CM

## 2014-03-08 DIAGNOSIS — I498 Other specified cardiac arrhythmias: Secondary | ICD-10-CM

## 2014-03-08 DIAGNOSIS — I2589 Other forms of chronic ischemic heart disease: Secondary | ICD-10-CM

## 2014-03-08 DIAGNOSIS — I519 Heart disease, unspecified: Secondary | ICD-10-CM

## 2014-03-08 DIAGNOSIS — I471 Supraventricular tachycardia: Secondary | ICD-10-CM

## 2014-03-08 DIAGNOSIS — I2510 Atherosclerotic heart disease of native coronary artery without angina pectoris: Secondary | ICD-10-CM

## 2014-03-08 DIAGNOSIS — I4729 Other ventricular tachycardia: Secondary | ICD-10-CM

## 2014-03-08 DIAGNOSIS — I469 Cardiac arrest, cause unspecified: Secondary | ICD-10-CM

## 2014-03-08 DIAGNOSIS — I255 Ischemic cardiomyopathy: Secondary | ICD-10-CM

## 2014-03-08 LAB — MDC_IDC_ENUM_SESS_TYPE_INCLINIC
Battery Remaining Longevity: 85.2 mo
Brady Statistic RA Percent Paced: 54 %
Brady Statistic RV Percent Paced: 94 %
HighPow Impedance: 58.5 Ohm
Implantable Pulse Generator Model: 2357
Implantable Pulse Generator Serial Number: 7175865
Lead Channel Impedance Value: 412.5 Ohm
Lead Channel Pacing Threshold Amplitude: 0.75 V
Lead Channel Pacing Threshold Amplitude: 0.75 V
Lead Channel Pacing Threshold Amplitude: 0.75 V
Lead Channel Pacing Threshold Amplitude: 0.75 V
Lead Channel Pacing Threshold Pulse Width: 0.5 ms
Lead Channel Pacing Threshold Pulse Width: 0.5 ms
Lead Channel Pacing Threshold Pulse Width: 0.5 ms
Lead Channel Sensing Intrinsic Amplitude: 4.3 mV
Lead Channel Setting Pacing Amplitude: 2 V
Lead Channel Setting Pacing Amplitude: 2.5 V
Lead Channel Setting Pacing Pulse Width: 0.5 ms
Lead Channel Setting Sensing Sensitivity: 0.5 mV
MDC IDC MSMT LEADCHNL RA PACING THRESHOLD PULSEWIDTH: 0.5 ms
MDC IDC MSMT LEADCHNL RV IMPEDANCE VALUE: 550 Ohm
MDC IDC MSMT LEADCHNL RV SENSING INTR AMPL: 11.7 mV
MDC IDC SESS DTM: 20150629151420
MDC IDC SET ZONE DETECTION INTERVAL: 300 ms
Zone Setting Detection Interval: 250 ms
Zone Setting Detection Interval: 350 ms

## 2014-03-08 NOTE — Progress Notes (Signed)
PCP: Kirk RuthsMCGOUGH,WILLIAM M, MD Primary Cardiologist:  Jacqulynn CadetHochrein  Lonnie [name] is a 68 y.o. male who presents today for electrophysiology followup.  Since having his ICD implanted, the patient reports doing very well.  At recent wound check, he was found to have an atrial tachycardia.  He has since been cardioverted and is now maintaining sinus rhythm.  He feels that his exercise tolerance is ok.  He did have recurrent VF with syncope 02/16/14 for which he was successfully defibrillated with his device.  I increased his bystolic at that time. Today, he denies symptoms of palpitations, chest pain, shortness of breath,  lower extremity edema, or dizziness.  The patient is otherwise without complaint today.   Past Medical History  Diagnosis Date  . High cholesterol   . Hypertension   . Constipation   . Reflux   . Complication of anesthesia     "Hard time waking me up" after sedation after dental procedure  . CAD (coronary artery disease)     LAD 95% proximal stenosis, D1 50-60% stenosis, the circumflex 40% stenosis, RCA subtotal stenosis.  . Cardiomyopathy, ischemic     EF was 30-35% by echo but 45-50% by cath.  Most recent EF 15%.    . Mitral regurgitation     Secondary to papillary muscle rupture  . H/O cardiac arrest   . Atrial tachycardia    Past Surgical History  Procedure Laterality Date  . Stomach ulcer repair    . Tee without cardioversion N/A 02/04/2013    Procedure: TRANSESOPHAGEAL ECHOCARDIOGRAM (TEE);  Surgeon: Vesta MixerPhilip J Nahser, MD;  [facility];  Service: Cardiovascular;  Laterality: N/A;  . Intraoperative transesophageal echocardiogram N/A 02/05/2013    Procedure: INTRAOPERATIVE TRANSESOPHAGEAL ECHOCARDIOGRAM;  Surgeon: Loreli SlotSteven C Hendrickson, MD;  [facility];  Service: Open Heart Surgery;  Laterality: N/A;  . Endovein harvest of greater saphenous vein Right 02/05/2013    Procedure: ENDOVEIN HARVEST OF GREATER SAPHENOUS VEIN;  Surgeon: Loreli SlotSteven C Hendrickson, MD;   [facility];  Service: Open Heart Surgery;  Laterality: Right;  . Patent foramen ovale closure N/A 02/05/2013    Procedure: PATENT FORAMEN OVALE CLOSURE;  Surgeon: Loreli SlotSteven C Hendrickson, MD;  [facility];  Service: Open Heart Surgery;  Laterality: N/A;  . Mitral valve replacement (mvr)/coronary artery bypass grafting (cabg) N/A 02/05/2013    Procedure: MITRAL VALVE REPLACEMENT (MVR)/CORONARY ARTERY BYPASS GRAFTING (CABG);  Surgeon: Loreli SlotSteven C Hendrickson, MD;  [facility];  Service: Open Heart Surgery;  Laterality: N/A;  x3 using right greater saphenous vein and left internal mammary.   . Coronary artery bypass graft    . Implantable cardioverter defibrillator implant  12/03/2013    STJ Fortify ICD implanted for secondary prevention by Dr [name]  . Cardioversion N/A 01/25/2014    Procedure: CARDIOVERSION;  Surgeon: Wendall StadePeter C Nishan, MD;  [facility];  Service: Cardiovascular;  Laterality: N/A;    Current Outpatient Prescriptions  Medication Sig Dispense Refill  . acetaminophen (TYLENOL) 500 MG tablet Take 500 mg by mouth as needed.      Marland Kitchen. apixaban (ELIQUIS) 5 MG TABS tablet Take 1 tablet (5 mg total) by mouth 2 (two) times daily.  60 tablet  11  . atorvastatin (LIPITOR) 80 MG tablet Take 80 mg by mouth daily at 6 PM.      . bisoprolol (ZEBETA) 10 MG tablet Take 1 tablet (10 mg total) by mouth 2 (two) times daily.  60 tablet  11  . furosemide (LASIX) 40 MG  tablet Take 1 tablet (40 mg total) by mouth daily.  30 tablet  6  . hydrALAZINE (APRESOLINE) 25 MG tablet Take 1 tablet (25 mg total) by mouth every 8 (eight) hours.  90 tablet  6  . isosorbide mononitrate (IMDUR) 30 MG 24 hr tablet Take 1 tablet (30 mg total) by mouth daily.  30 tablet  6  . nitroGLYCERIN (NITROSTAT) 0.4 MG SL tablet Place 1 tablet (0.4 mg total) under the tongue every 5 (five) minutes as needed for chest pain.  25 tablet  3  . omeprazole (PRILOSEC) 20 MG capsule Take 20 mg by mouth daily.       No current  facility-administered medications for this visit.    Physical Exam: Filed Vitals:   03/08/14 1422  BP: 122/62  Pulse: 60  Height: 6\' 1"  (1.854 m)  Weight: 227 lb (102.967 kg)    GEN- The patient is well appearing, alert and oriented x 3 today.   Head- normocephalic, atraumatic Eyes-  Sclera clear, conjunctiva pink Ears- hearing intact Oropharynx- clear Lungs- Clear to ausculation bilaterally, normal work of breathing Chest- ICD pocket is well healed Heart- irregular rate and rhythm, no murmurs, rubs or gallops, PMI not laterally displaced GI- soft, NT, ND, + BS Extremities- no clubbing, cyanosis, or edema  ICD interrogation- reviewed in detail today,  See PACEART report  Assessment and Plan:  1.  S/p VF arrest/ischemic CM/ chronic systolic dysfunction euvolemic today Stable on an appropriate medical regimen Normal ICD function See Pace Art report No changes today No driving for 6 months (patient aware) He V paces more than I had anticipated.  We will see what his upcoming echo reveals.  I would like to avoid device upgrade at this time and will therefore follow his EF and CHF symptoms. He is followed in our ICM device clinic.  2. Atrial tachycardia Maintaining sinus rhythm post recent cardioversion  3. HTN Stable No change required today  I will see in 3 months Monthly coreview checks Follow-up with Dr Antoine PocheHochrein as scheduled

## 2014-03-08 NOTE — Patient Instructions (Addendum)
   Your physician [facility] that you [facility] a [facility] on 04/08/14

## 2014-03-16 ENCOUNTER — Encounter: Payer: Self-pay | Admitting: Internal Medicine

## 2014-04-01 ENCOUNTER — Other Ambulatory Visit: Payer: Self-pay | Admitting: *Deleted

## 2014-04-01 MED ORDER — BISOPROLOL FUMARATE 10 MG PO TABS: 10.0000 mg | ORAL_TABLET | Freq: Two times a day (BID) | ORAL | Status: DC

## 2014-04-01 NOTE — Telephone Encounter (Signed)
pt wife called to refill pt Bisoprolol. Pt wife aware refill was sent to pharmacy

## 2014-04-08 ENCOUNTER — Encounter: Payer: Self-pay | Admitting: *Deleted

## 2014-04-08 ENCOUNTER — Ambulatory Visit (INDEPENDENT_AMBULATORY_CARE_PROVIDER_SITE_OTHER): Payer: Managed Care, Other (non HMO) | Admitting: *Deleted

## 2014-04-08 DIAGNOSIS — Z95810 Presence of automatic (implantable) cardiac defibrillator: Secondary | ICD-10-CM

## 2014-04-08 DIAGNOSIS — I519 Heart disease, unspecified: Secondary | ICD-10-CM

## 2014-04-08 NOTE — Progress Notes (Signed)
EPIC Encounter for ICM Monitoring  Patient Name: Lonnie [name] is a 68 y.o. male Date: 04/08/2014 Primary Care Physican: No PCP Primary Cardiologist: Hochrein Electrophysiologist: Allred Dry Weight: 210 lbs       In the past month, have you:  1. Gained more than 2 pounds in a day or more than 5 pounds in a week? no  2. Had changes in your medications (with verification of current medications)? no  3. Had more shortness of breath than is usual for you? no  4. Limited your activity because of shortness of breath? no  5. Not been able to sleep because of shortness of breath? no  6. Had increased swelling in your feet or ankles? no  7. Had symptoms of dehydration (dizziness, dry mouth, increased thirst, decreased urine output) no  8. Had changes in sodium restriction? no  9. Been compliant with medication? Yes   ICM trend:   Follow-up plan: ICM clinic phone appointment: 05/10/14. The patient is doing well at this time. Atrial noise reversion noted as an alert on the patient's transmission today. He is also still VP at 93%. Reviewed with Lonnie [name] in the device room. She will print report and put in Dr. Jenel [name]'s red folder for review. The patient is scheduled for a follow up echo and office visit with Dr. Antoine [name] on 04/12/14.  Copy of note sent to patient's primary care physician, primary cardiologist, and device following physician.  Sherri RadHeather Liannah Yarbough, RN, BSN 04/08/2014 11:26 AM

## 2014-04-12 ENCOUNTER — Ambulatory Visit (HOSPITAL_COMMUNITY)
Admission: RE | Admit: 2014-04-12 | Discharge: 2014-04-12 | Disposition: A | Discharge status: Home / Self Care | Payer: Managed Care, Other (non HMO) | Source: Ambulatory Visit | Attending: Cardiovascular Disease | Admitting: Cardiovascular Disease

## 2014-04-12 ENCOUNTER — Ambulatory Visit (INDEPENDENT_AMBULATORY_CARE_PROVIDER_SITE_OTHER): Payer: Managed Care, Other (non HMO) | Admitting: Cardiology

## 2014-04-12 ENCOUNTER — Encounter: Payer: Self-pay | Admitting: Cardiology

## 2014-04-12 VITALS — BP 100/72 | HR 72 | Ht 73.0 in | Wt 227.0 lb

## 2014-04-12 DIAGNOSIS — I10 Essential (primary) hypertension: Secondary | ICD-10-CM | POA: Insufficient documentation

## 2014-04-12 DIAGNOSIS — I509 Heart failure, unspecified: Secondary | ICD-10-CM | POA: Insufficient documentation

## 2014-04-12 DIAGNOSIS — I5022 Chronic systolic (congestive) heart failure: Secondary | ICD-10-CM

## 2014-04-12 DIAGNOSIS — I2510 Atherosclerotic heart disease of native coronary artery without angina pectoris: Secondary | ICD-10-CM | POA: Insufficient documentation

## 2014-04-12 DIAGNOSIS — E785 Hyperlipidemia, unspecified: Secondary | ICD-10-CM | POA: Insufficient documentation

## 2014-04-12 DIAGNOSIS — I369 Nonrheumatic tricuspid valve disorder, unspecified: Secondary | ICD-10-CM

## 2014-04-12 NOTE — Patient Instructions (Signed)
Your physician recommends that you schedule a follow-up appointment in: in September with Dr. Antoine PocheHochrein  Have your labs done fasting

## 2014-04-12 NOTE — Progress Notes (Signed)
HPI The patient presents for evaluation after CABG and mitral valve replacement. He presented late after myocardial infarction. 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 He ultimately required CABG with bioprosthetic mitral valve replacement. He had a resultant EF of 30-35%.  We had discussed an ICD and he was considering this.   He suffered cardiac arrest on 2/22 with VT/VF.  Follow up cath demonstrated patent bypass grafts.  He initially went home with a Life Vest.  He subsequently returned for ICD placement on 12/03/13.  The most recent EF at the time of the arrest was 15%.  He had a repeat EF today and this EF appears to be about 20%.  I reviewed these images.  He is s/p cardioversion and was maintaining NSR.   He is not as active as I would like.  He is sleeping a lot.  The patient denies any new symptoms such as chest discomfort, neck or arm discomfort. There has been no new shortness of breath, PND or orthopnea. There have been no reported palpitations, presyncope or syncope.  He has had no sudden weight gain or edema.    No Known Allergies  Current Outpatient Prescriptions  Medication Sig Dispense Refill  . acetaminophen (TYLENOL) 500 MG tablet Take 500 mg by mouth as needed.      Marland Kitchen. apixaban (ELIQUIS) 5 MG TABS tablet Take 1 tablet (5 mg total) by mouth 2 (two) times daily.  60 tablet  11  . atorvastatin (LIPITOR) 80 MG tablet Take 80 mg by mouth daily at 6 PM.      . bisoprolol (ZEBETA) 10 MG tablet Take 1 tablet (10 mg total) by mouth 2 (two) times daily.  60 tablet  11  . furosemide (LASIX) 40 MG tablet Take 1 tablet (40 mg total) by mouth daily.  30 tablet  6  . hydrALAZINE (APRESOLINE) 25 MG tablet Take 1 tablet (25 mg total) by mouth every 8 (eight) hours.  90 tablet  6  . isosorbide mononitrate (IMDUR) 30 MG 24 hr tablet Take 1 tablet (30 mg total) by mouth daily.  30 tablet  6  . nitroGLYCERIN (NITROSTAT) 0.4 MG SL tablet Place 1 tablet (0.4 mg total)  under the tongue every 5 (five) minutes as needed for chest pain.  25 tablet  3  . omeprazole (PRILOSEC) 20 MG capsule Take 20 mg by mouth daily.       No current facility-administered medications for this visit.    Past Medical History  Diagnosis Date  . High cholesterol   . Hypertension   . Constipation   . Reflux   . Complication of anesthesia     "Hard time waking me up" after sedation after dental procedure  . CAD (coronary artery disease)     LAD 95% proximal stenosis, D1 50-60% stenosis, the circumflex 40% stenosis, RCA subtotal stenosis.  . Cardiomyopathy, ischemic     EF was 30-35% by echo but 45-50% by cath.  Most recent EF 15%.    . Mitral regurgitation     Secondary to papillary muscle rupture  . H/O cardiac arrest   . Atrial tachycardia     Past Surgical History  Procedure Laterality Date  . Stomach ulcer repair    . Tee without cardioversion N/A 02/04/2013    Procedure: TRANSESOPHAGEAL ECHOCARDIOGRAM (TEE);  Surgeon: Vesta MixerPhilip J Nahser, MD;  Location: [facility] ENDOSCOPY;  Service: Cardiovascular;  Laterality: N/A;  . Intraoperative transesophageal echocardiogram N/A 02/05/2013    Procedure: INTRAOPERATIVE  TRANSESOPHAGEAL ECHOCARDIOGRAM;  Surgeon: [name], MD;  Location: [facility] OR;  Service: Open Heart Surgery;  Laterality: N/A;  . Endovein harvest of greater saphenous vein Right 02/05/2013    Procedure: ENDOVEIN HARVEST OF GREATER SAPHENOUS VEIN;  Surgeon: [name], MD;  Location: [facility] OR;  Service: Open Heart Surgery;  Laterality: Right;  . Patent foramen ovale closure N/A 02/05/2013    Procedure: PATENT FORAMEN OVALE CLOSURE;  Surgeon: [name], MD;  Location: [facility] OR;  Service: Open Heart Surgery;  Laterality: N/A;  . Mitral valve replacement (mvr)/coronary artery bypass grafting (cabg) N/A 02/05/2013    Procedure: MITRAL VALVE REPLACEMENT (MVR)/CORONARY ARTERY BYPASS GRAFTING (CABG);  Surgeon: [name], MD;  Location: [facility] OR;  Service:  Open Heart Surgery;  Laterality: N/A;  x3 using right greater saphenous vein and left internal mammary.   . Coronary artery bypass graft    . Implantable cardioverter defibrillator implant  12/03/2013    STJ Fortify ICD implanted for secondary prevention by Dr Johney Frame  . Cardioversion N/A 01/25/2014    Procedure: CARDIOVERSION;  Surgeon: Wendall Stade, MD;  Location: [facility] ENDOSCOPY;  Service: Cardiovascular;  Laterality: N/A;    ROS:  As stated in the HPI and negative for all other systems.   PHYSICAL EXAM BP 100/72  Pulse 72  Ht 6\' 1"  (1.854 m)  Wt 227 lb (102.967 kg)  BMI 29.96 kg/m2 GENERAL:  Well appearing NECK:  No JVD, waveform within normal limits, carotid upstroke brisk and symmetric, no bruits, no thyromegaly LUNGS:  Slightly diminished breath sounds at the bases BACK:  No CVA tenderness CHEST:  Well healed sternotomy scar, well healed ICD scar.  HEART:  PMI not displaced or sustained,S1 and S2 within normal limits, no S3, no S4, no clicks, no rubs, soft apical systolic murmur nonradiating, no diastolic murmurs ABD:  Flat, positive bowel sounds normal in frequency in pitch, no bruits, no rebound, no guarding, no midline pulsatile mass, no hepatomegaly, no splenomegaly EXT:  2 plus pulses throughout, no lower extremity edema, no cyanosis no clubbing  EKG:  Sinus rhythm, ventricular pacing, rate 60, premature ventricular contractions  ASSESSMENT AND PLAN  CARDIOMYOPATHY:  At this point I will defer med titration as his BP is low. We are avoiding ACE inhibitors because of renal insufficiency. He will get a comprehensive metabolic profile today.   MVR: He has stable valve prosthesis. I will follow up on the echo reading done today.   CAD:  The patient is having no symptoms. No further cardiovascular testing is suggested. He will continue with risk reduction.  ATRIAL TACHYCARDIA:  He will continue his Eliquis.  He has an appt with Dr. Johney Frame in Sept.    ICD:  He is up to date  and will continue to follow up.  I did not that his Optivol Index is creeping up but he has no overt complaints or volume overload.  We discussed salt and fluid restriciton.  CKD:  I will check a CMET.

## 2014-04-12 NOTE — Progress Notes (Signed)
2D Echo Performed 04/12/2014    Brielle Moro, RCS  

## 2014-04-13 LAB — LIPID PANEL
CHOLESTEROL: 119 mg/dL (ref 0–200)
HDL: 46 mg/dL (ref >39)
LDL CALC: 55 mg/dL (ref 0–99)
Total CHOL/HDL Ratio: 2.6 Ratio
Triglycerides: 90 mg/dL (ref <150)
VLDL: 18 mg/dL (ref 0–40)

## 2014-04-13 LAB — COMPREHENSIVE METABOLIC PANEL
ALK PHOS: 105 U/L (ref 39–117)
ALT: 34 U/L (ref 0–53)
AST: 27 U/L (ref 0–37)
Albumin: 4.4 g/dL (ref 3.5–5.2)
BUN: 23 mg/dL (ref 6–23)
CO2: 27 meq/L (ref 19–32)
Calcium: 8.8 mg/dL (ref 8.4–10.5)
Chloride: 105 mEq/L (ref 96–112)
Creat: 1.58 mg/dL — ABNORMAL HIGH (ref 0.50–1.35)
Glucose, Bld: 89 mg/dL (ref 70–99)
Potassium: 4 mEq/L (ref 3.5–5.3)
SODIUM: 144 meq/L (ref 135–145)
TOTAL PROTEIN: 7.1 g/dL (ref 6.0–8.3)
Total Bilirubin: 1.2 mg/dL (ref 0.2–1.2)

## 2014-04-14 NOTE — Addendum Note (Signed)
Addended by: Vincenza HewsWILDMAN, Brayten Komar C. on: 04/14/2014 07:52 AM   Modules accepted: Orders

## 2014-04-22 NOTE — Progress Notes (Signed)
Pt. Informed of his labs 

## 2014-05-10 ENCOUNTER — Encounter: Payer: Managed Care, Other (non HMO) | Admitting: *Deleted

## 2014-05-31 ENCOUNTER — Ambulatory Visit (INDEPENDENT_AMBULATORY_CARE_PROVIDER_SITE_OTHER): Payer: Managed Care, Other (non HMO) | Admitting: Cardiology

## 2014-05-31 ENCOUNTER — Encounter: Payer: Self-pay | Admitting: Cardiology

## 2014-05-31 VITALS — BP 102/60 | HR 62 | Ht 73.0 in | Wt 228.0 lb

## 2014-05-31 DIAGNOSIS — I2510 Atherosclerotic heart disease of native coronary artery without angina pectoris: Secondary | ICD-10-CM

## 2014-05-31 DIAGNOSIS — I2119 ST elevation (STEMI) myocardial infarction involving other coronary artery of inferior wall: Secondary | ICD-10-CM

## 2014-05-31 MED ORDER — BISOPROLOL FUMARATE 10 MG PO TABS: 10.0000 mg | ORAL_TABLET | Freq: Every day | ORAL | Status: DC

## 2014-05-31 NOTE — Progress Notes (Signed)
HPI The patient presents for evaluation after CABG and mitral valve replacement. He presented late after myocardial infarction. 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 He ultimately required CABG with bioprosthetic mitral valve replacement. He had a resultant EF of 30-35%.  We had discussed an ICD and he was considering this.   He suffered cardiac arrest on 2/22 with VT/VF.  Follow up cath demonstrated patent bypass grafts.  He initially went home with a Life Vest.  He subsequently returned for ICD placement on 12/03/13.  The most recent EF at the time of the arrest was 15%.  He had a repeat EF today and this EF appears to be about 20%.  I reviewed these images.  He is s/p cardioversion and was maintaining NSR.   Since I last saw him he has done well.  The patient denies any new symptoms such as chest discomfort, neck or arm discomfort. There has been no new shortness of breath, PND or orthopnea. There have been no reported palpitations, presyncope or syncope.  He will get short of breath he does significant activities. However, he's not having shortness of breath at rest.  No Known Allergies  Current Outpatient Prescriptions  Medication Sig Dispense Refill  . acetaminophen (TYLENOL) 500 MG tablet Take 500 mg by mouth as needed.      Marland Kitchen apixaban (ELIQUIS) 5 MG TABS tablet Take 1 tablet (5 mg total) by mouth 2 (two) times daily.  60 tablet  11  . atorvastatin (LIPITOR) 80 MG tablet Take 80 mg by mouth daily at 6 PM.      . bisoprolol (ZEBETA) 10 MG tablet Take 1 tablet (10 mg total) by mouth 2 (two) times daily.  60 tablet  11  . furosemide (LASIX) 40 MG tablet Take 1 tablet (40 mg total) by mouth daily.  30 tablet  6  . hydrALAZINE (APRESOLINE) 25 MG tablet Take 1 tablet (25 mg total) by mouth every 8 (eight) hours.  90 tablet  6  . isosorbide mononitrate (IMDUR) 30 MG 24 hr tablet Take 1 tablet (30 mg total) by mouth daily.  30 tablet  6  . nitroGLYCERIN (NITROSTAT)  0.4 MG SL tablet Place 1 tablet (0.4 mg total) under the tongue every 5 (five) minutes as needed for chest pain.  25 tablet  3  . omeprazole (PRILOSEC) 20 MG capsule Take 20 mg by mouth daily.       No current facility-administered medications for this visit.    Past Medical History  Diagnosis Date  . High cholesterol   . Hypertension   . Constipation   . Reflux   . Complication of anesthesia     "Hard time waking me up" after sedation after dental procedure  . CAD (coronary artery disease)     LAD 95% proximal stenosis, D1 50-60% stenosis, the circumflex 40% stenosis, RCA subtotal stenosis.  . Cardiomyopathy, ischemic     EF was 30-35% by echo but 45-50% by cath.  Most recent EF 15%.    . Mitral regurgitation     Secondary to papillary muscle rupture  . H/O cardiac arrest   . Atrial tachycardia     Past Surgical History  Procedure Laterality Date  . Stomach ulcer repair    . Tee without cardioversion N/A 02/04/2013    Procedure: TRANSESOPHAGEAL ECHOCARDIOGRAM (TEE);  Surgeon: Vesta Mixer, MD;  Location: [facility] ENDOSCOPY;  Service: Cardiovascular;  Laterality: N/A;  . Intraoperative transesophageal echocardiogram N/A 02/05/2013  Procedure: INTRAOPERATIVE TRANSESOPHAGEAL ECHOCARDIOGRAM;  Surgeon: [name], MD;  Location: [facility] OR;  Service: Open Heart Surgery;  Laterality: N/A;  . Endovein harvest of greater saphenous vein Right 02/05/2013    Procedure: ENDOVEIN HARVEST OF GREATER SAPHENOUS VEIN;  Surgeon: [name], MD;  Location: [facility] OR;  Service: Open Heart Surgery;  Laterality: Right;  . Patent foramen ovale closure N/A 02/05/2013    Procedure: PATENT FORAMEN OVALE CLOSURE;  Surgeon: [name], MD;  Location: [facility] OR;  Service: Open Heart Surgery;  Laterality: N/A;  . Mitral valve replacement (mvr)/coronary artery bypass grafting (cabg) N/A 02/05/2013    Procedure: MITRAL VALVE REPLACEMENT (MVR)/CORONARY ARTERY BYPASS GRAFTING (CABG);  Surgeon: [name], MD;  Location: [facility] OR;  Service: Open Heart Surgery;  Laterality: N/A;  x3 using right greater saphenous vein and left internal mammary.   . Coronary artery bypass graft    . Implantable cardioverter defibrillator implant  12/03/2013    STJ Fortify ICD implanted for secondary prevention by Dr Johney Frame  . Cardioversion N/A 01/25/2014    Procedure: CARDIOVERSION;  Surgeon: Wendall Stade, MD;  Location: [facility] ENDOSCOPY;  Service: Cardiovascular;  Laterality: N/A;    ROS:  As stated in the HPI and negative for all other systems.   PHYSICAL EXAM BP 102/60  Pulse 62  Ht  (1.854 m)  Wt 228 lb (103.42 kg)  BMI 30.09 kg/m2 GENERAL:  Well appearing NECK:  No JVD, waveform within normal limits, carotid upstroke brisk and symmetric, no bruits, no thyromegaly LUNGS:  Slightly diminished breath sounds at the bases BACK:  No CVA tenderness CHEST:  Well healed sternotomy scar, well healed ICD scar.  HEART:  PMI not displaced or sustained,S1 and S2 within normal limits, no S3, no S4, no clicks, no rubs, soft apical systolic murmur nonradiating, no diastolic murmurs ABD:  Flat, positive bowel sounds normal in frequency in pitch, no bruits, no rebound, no guarding, no midline pulsatile mass, no hepatomegaly, no splenomegaly EXT:  2 plus pulses throughout, no lower extremity edema, no cyanosis no clubbing  EKG:  Sinus rhythm, atrio/ventricular pacing, rate 62, premature ventricular contractions  ASSESSMENT AND PLAN  CARDIOMYOPATHY:  His EF was slightly improved at last echo. He was taking his bisoprolol twice daily I corrected this. Blood pressure was not allowed titration and we are awaiting his hip or his knee RV his renal insufficiency.  MVR: He has stable valve prosthesis.   CAD:  The patient is having no symptoms. No further cardiovascular testing is suggested. He will continue with risk reduction.  ATRIAL TACHYCARDIA:  He has follow up with Dr. Johney Frame to discuss whether or not he  will continue Eliquis  ICD:  He is up to date and will continue to follow up and he has an upcoming appt with Dr. Johney Frame  CKD:  This was stable at the last visit.  No change in therapy is indicated.

## 2014-05-31 NOTE — Patient Instructions (Signed)
Your physician wants you to follow-up in: 6 months with Dr Antoine Poche. You will receive a reminder letter in the mail two months in advance. If you don't receive a letter, please call our office to schedule the follow-up appointment.  Only take the bisoprolol  once a day

## 2014-06-04 ENCOUNTER — Encounter: Payer: Self-pay | Admitting: Internal Medicine

## 2014-06-04 ENCOUNTER — Ambulatory Visit (INDEPENDENT_AMBULATORY_CARE_PROVIDER_SITE_OTHER): Payer: Managed Care, Other (non HMO) | Admitting: Internal Medicine

## 2014-06-04 VITALS — BP 110/72 | HR 77 | Ht 73.0 in | Wt 230.0 lb

## 2014-06-04 DIAGNOSIS — I469 Cardiac arrest, cause unspecified: Secondary | ICD-10-CM

## 2014-06-04 DIAGNOSIS — I509 Heart failure, unspecified: Secondary | ICD-10-CM

## 2014-06-04 DIAGNOSIS — I2589 Other forms of chronic ischemic heart disease: Secondary | ICD-10-CM

## 2014-06-04 DIAGNOSIS — I472 Ventricular tachycardia, unspecified: Secondary | ICD-10-CM

## 2014-06-04 DIAGNOSIS — I4729 Other ventricular tachycardia: Secondary | ICD-10-CM

## 2014-06-04 DIAGNOSIS — I255 Ischemic cardiomyopathy: Secondary | ICD-10-CM

## 2014-06-04 DIAGNOSIS — I498 Other specified cardiac arrhythmias: Secondary | ICD-10-CM | POA: Diagnosis not present

## 2014-06-04 DIAGNOSIS — I2510 Atherosclerotic heart disease of native coronary artery without angina pectoris: Secondary | ICD-10-CM

## 2014-06-04 DIAGNOSIS — I471 Supraventricular tachycardia: Secondary | ICD-10-CM

## 2014-06-04 LAB — MDC_IDC_ENUM_SESS_TYPE_INCLINIC
Battery Remaining Longevity: 76.8 mo
Brady Statistic RA Percent Paced: 83 %
Brady Statistic RV Percent Paced: 97 %
HIGH POWER IMPEDANCE MEASURED VALUE: 67.5 Ohm
Implantable Pulse Generator Model: 2357
Implantable Pulse Generator Serial Number: 7175865
Lead Channel Impedance Value: 437.5 Ohm
Lead Channel Pacing Threshold Amplitude: 0.75 V
Lead Channel Pacing Threshold Pulse Width: 0.5 ms
Lead Channel Sensing Intrinsic Amplitude: 11.7 mV
Lead Channel Sensing Intrinsic Amplitude: 5 mV
Lead Channel Setting Pacing Amplitude: 2 V
Lead Channel Setting Pacing Pulse Width: 0.5 ms
Lead Channel Setting Sensing Sensitivity: 0.5 mV
MDC IDC MSMT LEADCHNL RV IMPEDANCE VALUE: 450 Ohm
MDC IDC MSMT LEADCHNL RV PACING THRESHOLD AMPLITUDE: 1 V
MDC IDC MSMT LEADCHNL RV PACING THRESHOLD PULSEWIDTH: 0.5 ms
MDC IDC SESS DTM: 20150925120020
MDC IDC SET LEADCHNL RV PACING AMPLITUDE: 2.5 V
Zone Setting Detection Interval: 250 ms
Zone Setting Detection Interval: 300 ms
Zone Setting Detection Interval: 350 ms

## 2014-06-04 NOTE — Patient Instructions (Signed)
Your physician recommends that you schedule a follow-up appointment in: 1 year with Dr. Johney Frame. You will receive a reminder letter in the mail in about 10 months reminding you to call and schedule your appointment. If you don't receive this letter, please contact our office. Merlin device check on 09/06/14. Your physician recommends that you continue on your current medications as directed. Please refer to the Current Medication list given to you today.

## 2014-06-04 NOTE — Progress Notes (Signed)
PCP: Kirk Ruths, MD Primary Cardiologist:  Lonnie [name] is a 68 y.o. male who presents today for electrophysiology followup.  Since his last visit, the patient reports doing very well.  He feels that his exercise tolerance is improved.  He is planning to return to work soon.  He did have recurrent VF with syncope 02/16/14 for which he was successfully defibrillated with his device.  He has had no further arrhythmias.  His atrial arrhythmias are also controlled. Today, he denies symptoms of palpitations, chest pain, shortness of breath,  lower extremity edema, or dizziness.  The patient is otherwise without complaint today.   Past Medical History  Diagnosis Date  . High cholesterol   . Hypertension   . Constipation   . Reflux   . Complication of anesthesia     "Hard time waking me up" after sedation after dental procedure  . CAD (coronary artery disease)     LAD 95% proximal stenosis, D1 50-60% stenosis, the circumflex 40% stenosis, RCA subtotal stenosis.  . Cardiomyopathy, ischemic     EF was 30-35% by echo but 45-50% by cath.  Most recent EF 15%.    . Mitral regurgitation     Secondary to papillary muscle rupture  . H/O cardiac arrest   . Atrial tachycardia    Past Surgical History  Procedure Laterality Date  . Stomach ulcer repair    . Tee without cardioversion N/A 02/04/2013    Procedure: TRANSESOPHAGEAL ECHOCARDIOGRAM (TEE);  Surgeon: Vesta Mixer, MD;  Location: [facility] ENDOSCOPY;  Service: Cardiovascular;  Laterality: N/A;  . Intraoperative transesophageal echocardiogram N/A 02/05/2013    Procedure: INTRAOPERATIVE TRANSESOPHAGEAL ECHOCARDIOGRAM;  Surgeon: [name], MD;  Location: [facility] OR;  Service: Open Heart Surgery;  Laterality: N/A;  . Endovein harvest of greater saphenous vein Right 02/05/2013    Procedure: ENDOVEIN HARVEST OF GREATER SAPHENOUS VEIN;  Surgeon: [name], MD;  Location: [facility] OR;  Service: Open Heart Surgery;  Laterality: Right;   . Patent foramen ovale closure N/A 02/05/2013    Procedure: PATENT FORAMEN OVALE CLOSURE;  Surgeon: [name], MD;  Location: [facility] OR;  Service: Open Heart Surgery;  Laterality: N/A;  . Mitral valve replacement (mvr)/coronary artery bypass grafting (cabg) N/A 02/05/2013    Procedure: MITRAL VALVE REPLACEMENT (MVR)/CORONARY ARTERY BYPASS GRAFTING (CABG);  Surgeon: [name], MD;  Location: [facility] OR;  Service: Open Heart Surgery;  Laterality: N/A;  x3 using right greater saphenous vein and left internal mammary.   . Coronary artery bypass graft    . Implantable cardioverter defibrillator implant  12/03/2013    STJ Fortify ICD implanted for secondary prevention by Dr Johney Frame  . Cardioversion N/A 01/25/2014    Procedure: CARDIOVERSION;  Surgeon: Wendall Stade, MD;  Location: [facility] ENDOSCOPY;  Service: Cardiovascular;  Laterality: N/A;    Current Outpatient Prescriptions  Medication Sig Dispense Refill  . acetaminophen (TYLENOL) 500 MG tablet Take 500 mg by mouth as needed.      Marland Kitchen apixaban (ELIQUIS) 5 MG TABS tablet Take 1 tablet (5 mg total) by mouth 2 (two) times daily.  60 tablet  11  . atorvastatin (LIPITOR) 80 MG tablet Take 80 mg by mouth daily at 6 PM.      . bisoprolol (ZEBETA) 10 MG tablet Take 1 tablet (10 mg total) by mouth daily.  30 tablet  11  . furosemide (LASIX) 40 MG tablet Take 1 tablet (40 mg total) by mouth daily.  30 tablet  6  . hydrALAZINE (APRESOLINE) 25 MG tablet Take 1 tablet (25 mg total) by mouth every 8 (eight) hours.  90 tablet  6  . isosorbide mononitrate (IMDUR) 30 MG 24 hr tablet Take 1 tablet (30 mg total) by mouth daily.  30 tablet  6  . nitroGLYCERIN (NITROSTAT) 0.4 MG SL tablet Place 1 tablet (0.4 mg total) under the tongue every 5 (five) minutes as needed for chest pain.  25 tablet  3  . omeprazole (PRILOSEC) 20 MG capsule Take 20 mg by mouth daily.       No current facility-administered medications for this visit.    Physical Exam: Filed  Vitals:   06/04/14 1146  BP: 110/72  Pulse: 77  Height:  (1.854 m)  Weight: 230 lb (104.327 kg)  SpO2: 97%    GEN- The patient is well appearing, alert and oriented x 3 today.   Head- normocephalic, atraumatic Eyes-  Sclera clear, conjunctiva pink Ears- hearing intact Oropharynx- clear Lungs- Clear to ausculation bilaterally, normal work of breathing Chest- ICD pocket is well healed Heart- irregular rate and rhythm, no murmurs, rubs or gallops, PMI not laterally displaced GI- soft, NT, ND, + BS Extremities- no clubbing, cyanosis, or edema  ICD interrogation- reviewed in detail today,  See PACEART report  Assessment and Plan:  1.  S/p VF arrest/ischemic CM/ chronic systolic dysfunction euvolemic today Stable on an appropriate medical regimen Normal ICD function See Pace Art report No changes today Follow for worsening CHF with RV pacing.  He would like to avoid device upgrade at this time.  2. Atrial tachycardia Maintaining sinus rhythm post recent cardioversion Continue eliquis for now  3. HTN Stable No change required today  Monthly coreview checks/ Merlin I will see again in a year Follow-up with Dr Antoine Poche as scheduled

## 2014-06-07 ENCOUNTER — Telehealth: Payer: Self-pay | Admitting: Internal Medicine

## 2014-06-07 ENCOUNTER — Encounter: Payer: Self-pay | Admitting: *Deleted

## 2014-06-07 NOTE — Telephone Encounter (Signed)
Walk In pt Form " Sealed Envelope" Kelly back Tuesday Will give to Her then  9.28.15/km

## 2014-06-09 ENCOUNTER — Telehealth: Payer: Self-pay | Admitting: Cardiology

## 2014-06-09 NOTE — Telephone Encounter (Signed)
Walk in pt Form that Was Dropped off was a [employer]/FMLA per Tresa EndoKelly it Needs to Be Sent to  Dr.Hochrein/[facility] this was Interofficed  [phone]/km

## 2014-08-19 ENCOUNTER — Encounter (HOSPITAL_COMMUNITY): Payer: Self-pay | Admitting: Cardiovascular Disease

## 2014-08-23 IMAGING — CR DG CHEST 1V PORT
1 series · 1 of 1 positions shown · non-contrast
Comparison: 01/31/2013

CLINICAL DATA: Shortness of breath

PORTABLE CHEST - 1 VIEW

[AP]
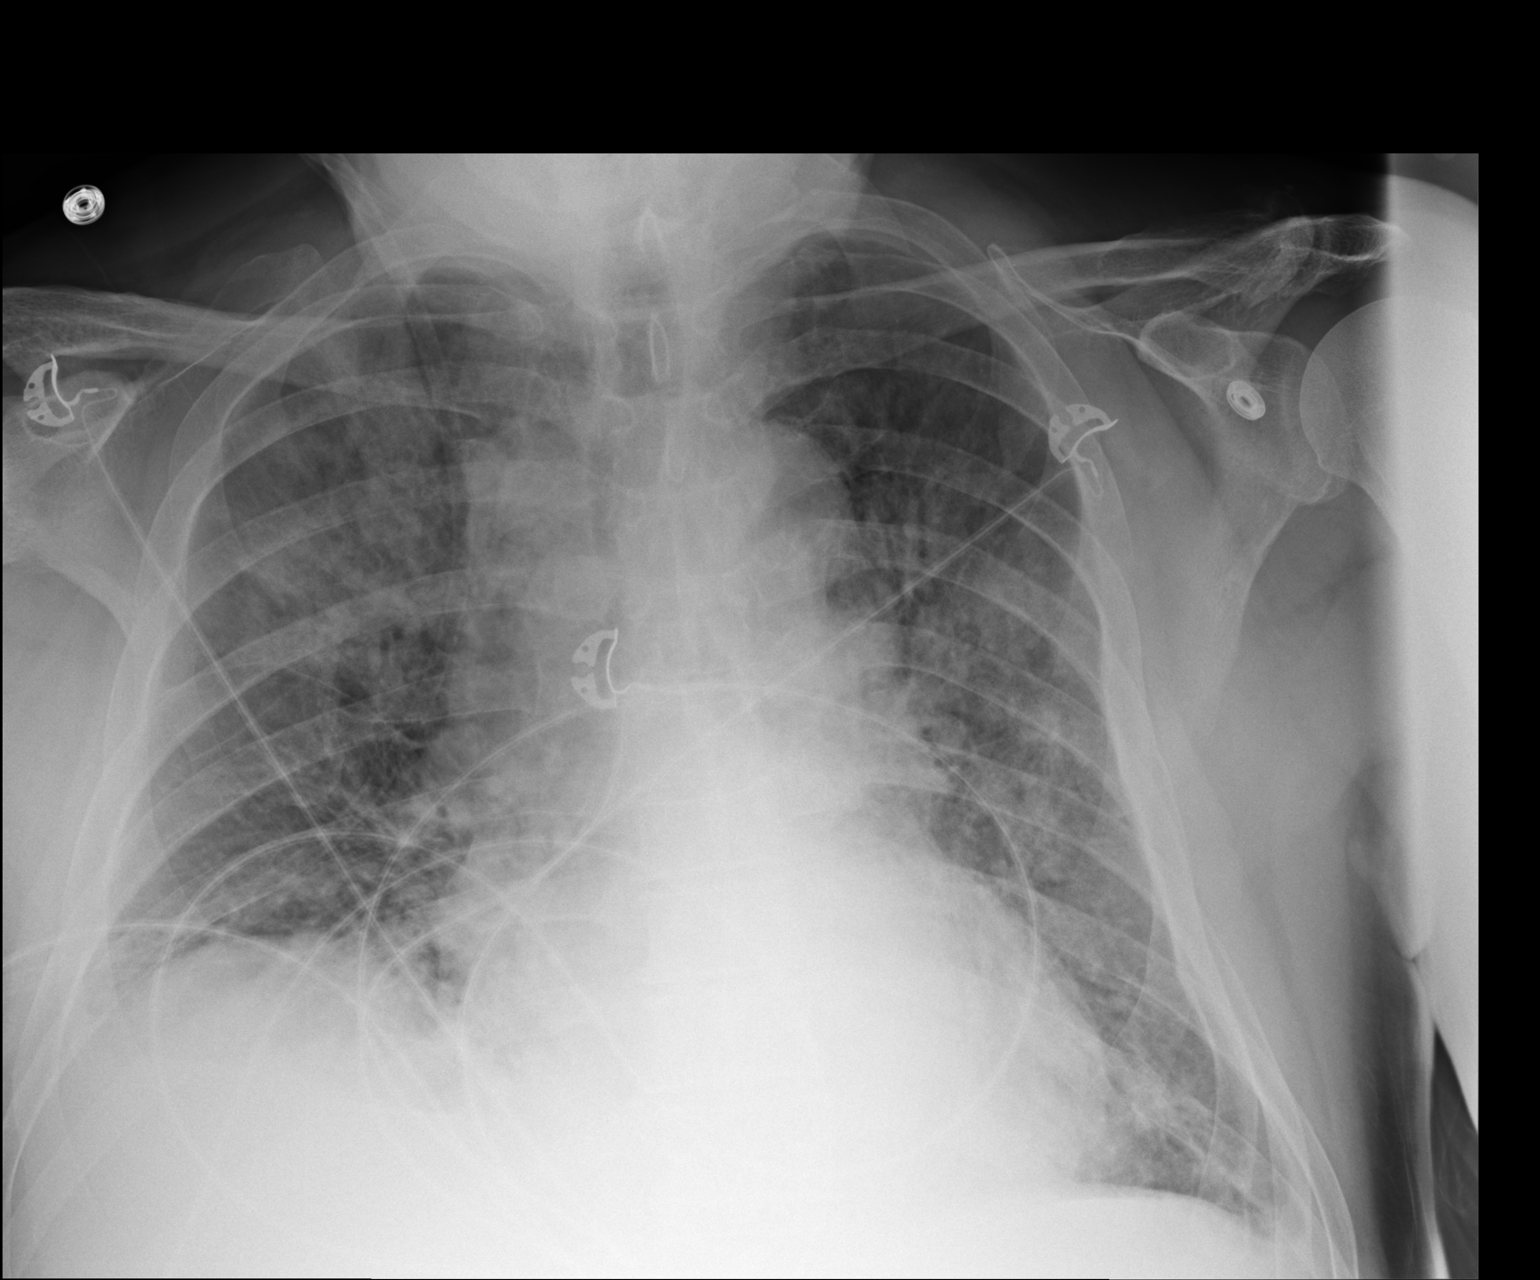

[1 of 1 positions shown; findings below may reference images not displayed]

FINDINGS: Mild cardiomegaly again noted.  Patchy bilateral
multilobar airspace opacities are again noted.  Prominence of the
central vasculature is present.  Trace effusions are noted.  No
significant change.
IMPRESSION: No significant change in cardiomegaly with multifocal airspace
opacities which could represent pneumonia or asymmetric edema.

## 2014-08-23 IMAGING — CR DG CHEST 1V PORT
1 series · 1 of 1 positions shown · non-contrast
Comparison: Chest radiograph performed earlier today at [DATE] p.m.

CLINICAL DATA: Dyspnea.

PORTABLE CHEST - 1 VIEW

[AP]
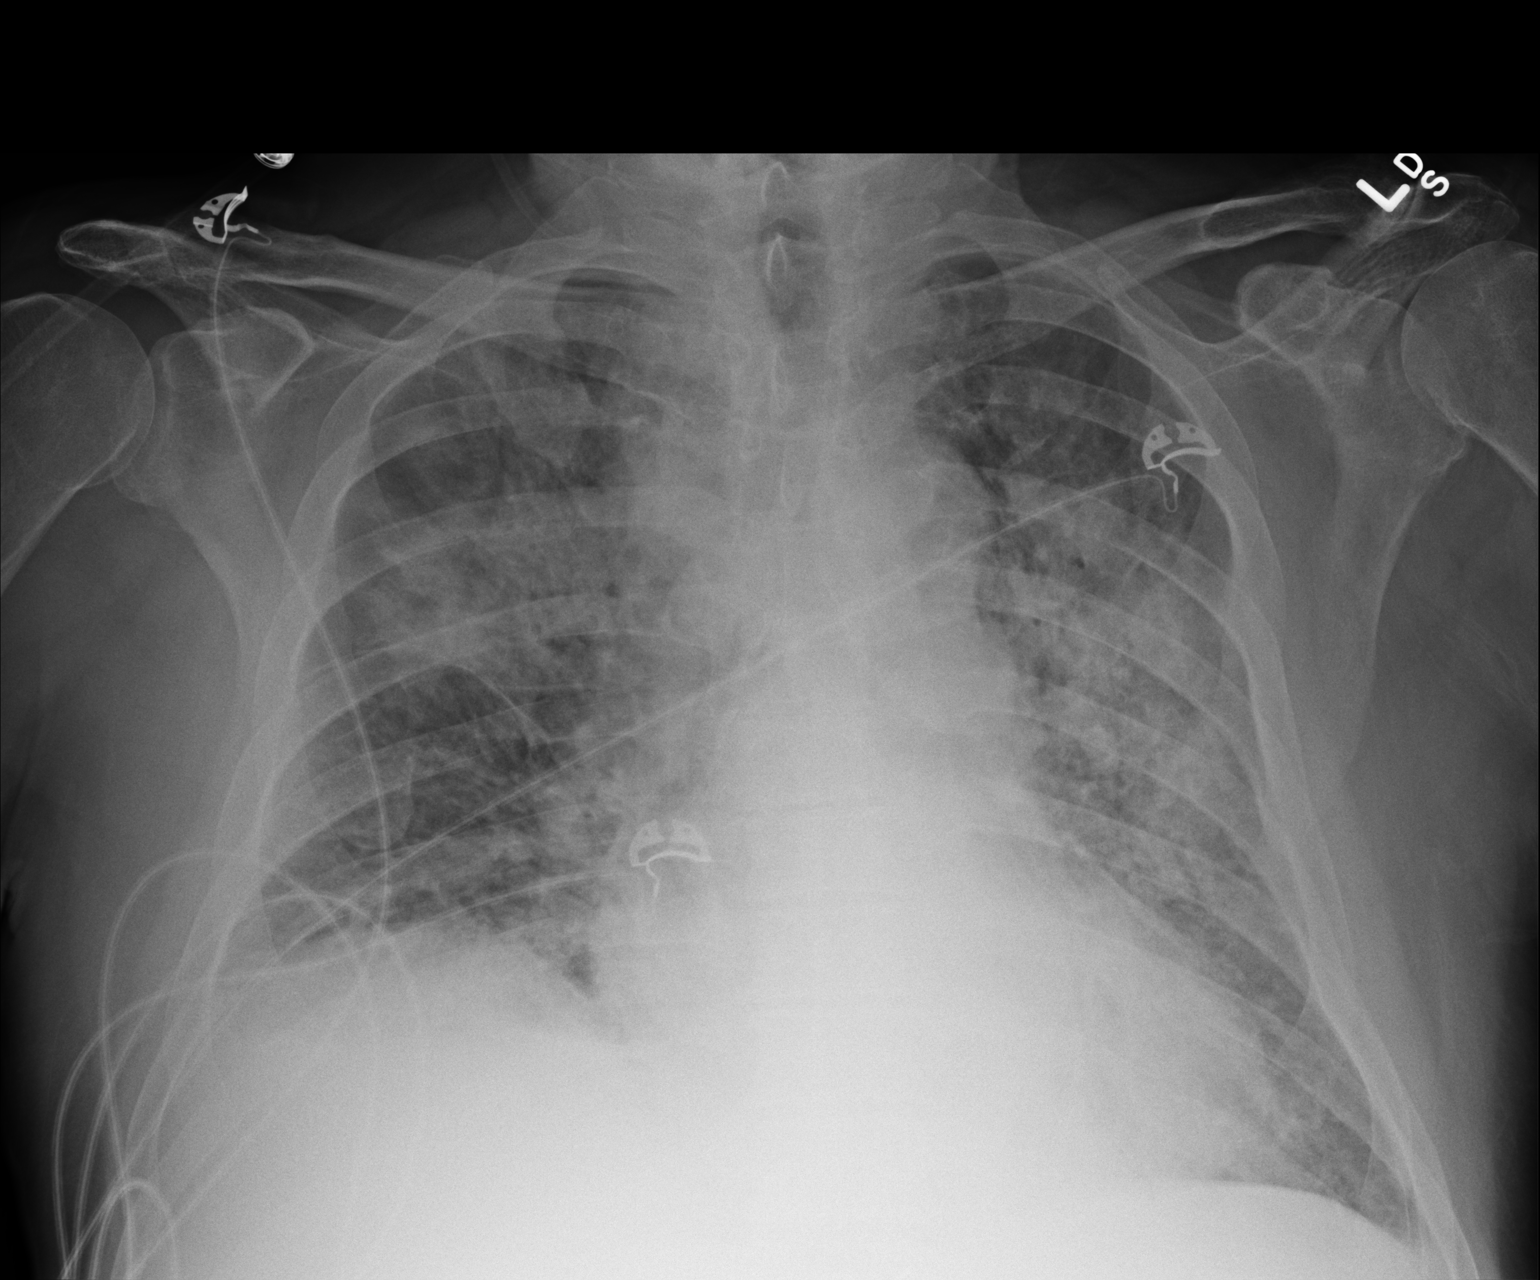

[1 of 1 positions shown; findings below may reference images not displayed]

FINDINGS: The lungs are well-aerated.  There is worsening bilateral
airspace opacification, most compatible with diffuse pneumonia.
This may also reflect pulmonary edema.  No definite pleural
effusion or pneumothorax is seen.

The cardiomediastinal silhouette is mildly enlarged.  No acute
osseous abnormalities are seen.
IMPRESSION: Interval worsening of bilateral airspace opacification, most
compatible with worsening diffuse pneumonia.  This could also
reflect pulmonary edema.  Mild cardiomegaly noted.

## 2014-08-25 IMAGING — CR DG CHEST 1V PORT
1 series · 1 of 1 positions shown · non-contrast
Comparison: 01/31/2013.

CLINICAL DATA: Shortness of breath.

PORTABLE CHEST - 1 VIEW

[AP]
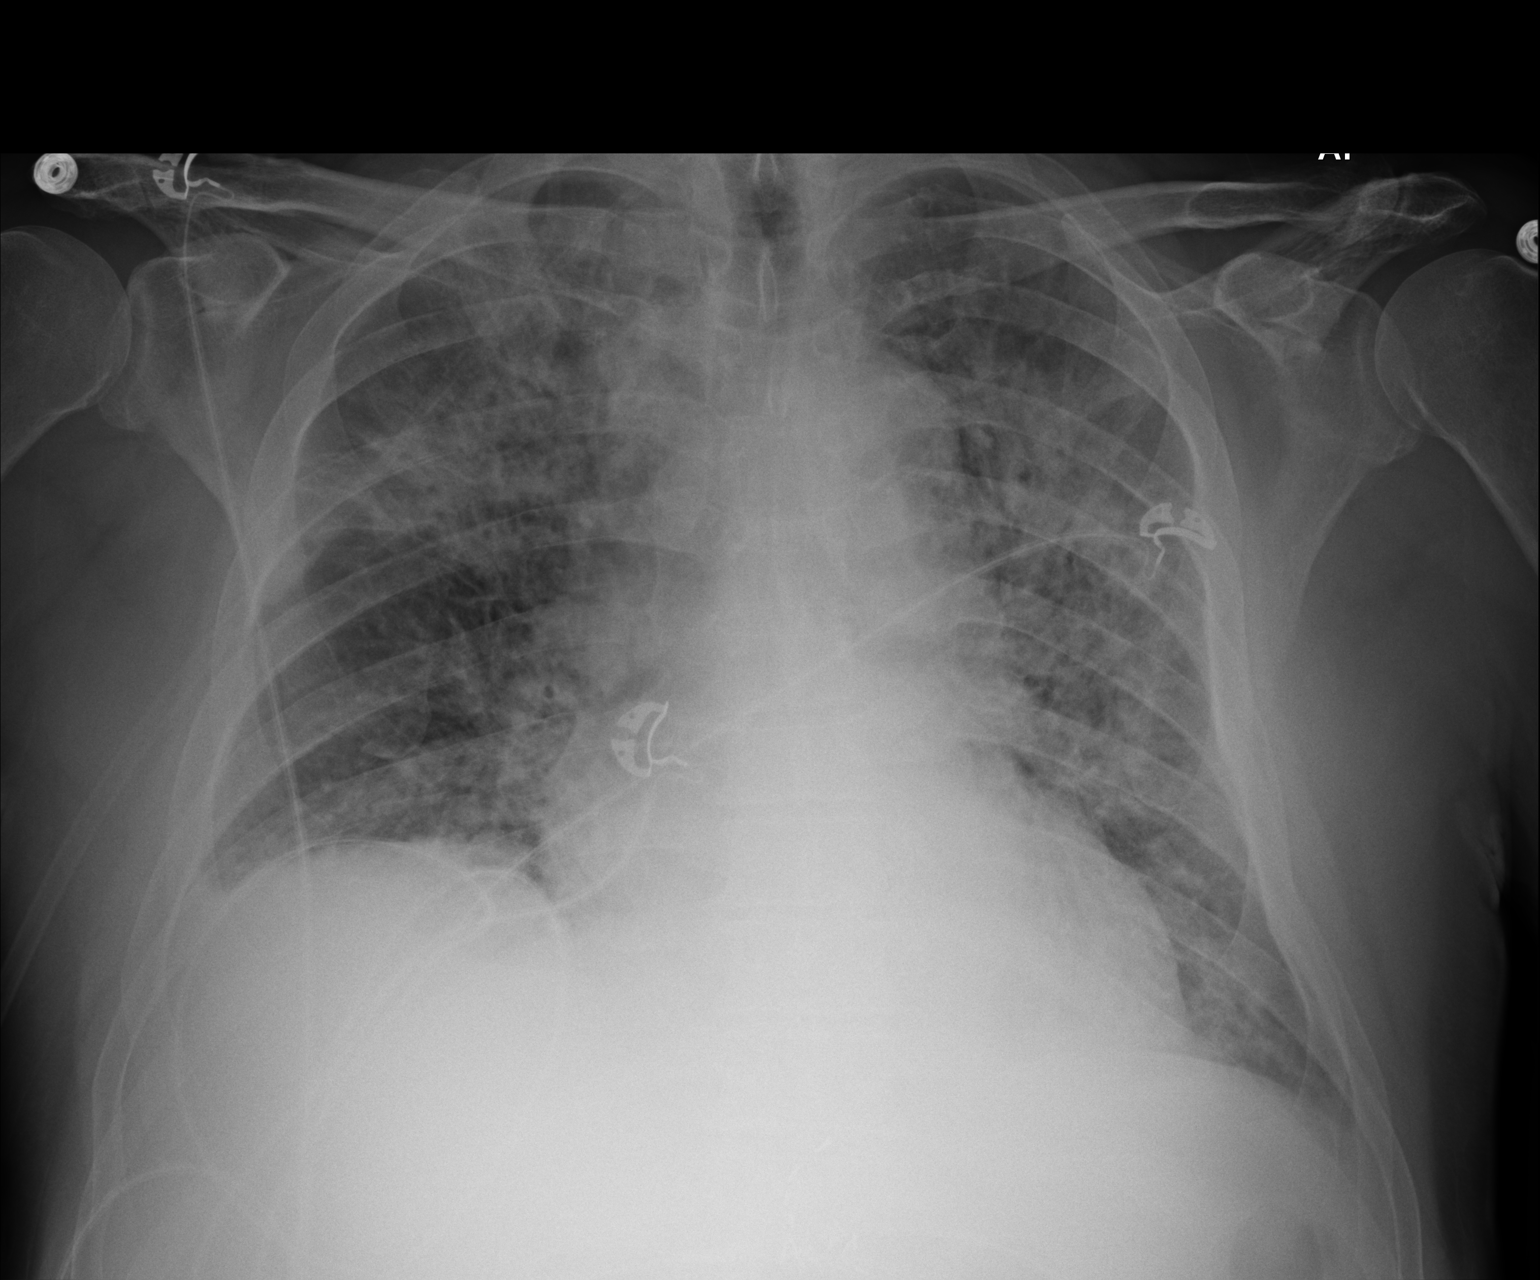

[1 of 1 positions shown; findings below may reference images not displayed]

FINDINGS: The cardiac silhouette, mediastinal and hilar contours
are prominent but unchanged.  Persistent bilateral interstitial and
airspace process.  No definite pleural effusions.
IMPRESSION: Persistent bilateral interstitial and airspace process.

## 2014-09-06 ENCOUNTER — Ambulatory Visit (INDEPENDENT_AMBULATORY_CARE_PROVIDER_SITE_OTHER): Payer: Managed Care, Other (non HMO) | Admitting: *Deleted

## 2014-09-06 ENCOUNTER — Telehealth: Payer: Self-pay | Admitting: Internal Medicine

## 2014-09-06 ENCOUNTER — Telehealth: Payer: Self-pay | Admitting: Cardiology

## 2014-09-06 ENCOUNTER — Encounter: Payer: Self-pay | Admitting: Internal Medicine

## 2014-09-06 DIAGNOSIS — I5022 Chronic systolic (congestive) heart failure: Secondary | ICD-10-CM

## 2014-09-06 DIAGNOSIS — I472 Ventricular tachycardia, unspecified: Secondary | ICD-10-CM

## 2014-09-06 DIAGNOSIS — I255 Ischemic cardiomyopathy: Secondary | ICD-10-CM

## 2014-09-06 NOTE — Telephone Encounter (Signed)
New msg       Pt transmitter is beeping loudly and has colors running across the front back.    Pt request a call back to get instructions.

## 2014-09-06 NOTE — Telephone Encounter (Signed)
Spoke w/ pt and attempted to help him trouble shoot monitor. Transmission was unsuccessful I instructed pt to call tech services. Pt verbalized understanding.

## 2014-09-06 NOTE — Telephone Encounter (Signed)
Spoke with pt and reminded pt of remote transmission that is due today. Pt verbalized understanding.   

## 2014-09-07 NOTE — Progress Notes (Signed)
Remote ICD transmission.   

## 2014-09-08 LAB — MDC_IDC_ENUM_SESS_TYPE_REMOTE
Battery Remaining Longevity: 73 mo
Battery Remaining Percentage: 87 %
Brady Statistic AP VP Percent: 62 %
Brady Statistic AP VS Percent: 1 %
Brady Statistic AS VP Percent: 38 %
Brady Statistic AS VS Percent: 1 %
Brady Statistic RA Percent Paced: 62 %
Brady Statistic RV Percent Paced: 99 %
Date Time Interrogation Session: 20151228215032
HighPow Impedance: 62 Ohm
HighPow Impedance: 62 Ohm
Implantable Pulse Generator Serial Number: 7175865
Lead Channel Impedance Value: 400 Ohm
Lead Channel Sensing Intrinsic Amplitude: 11.7 mV
Lead Channel Sensing Intrinsic Amplitude: 4.7 mV
Lead Channel Setting Pacing Amplitude: 2 V
Lead Channel Setting Pacing Amplitude: 2.5 V
Lead Channel Setting Sensing Sensitivity: 0.5 mV
MDC IDC MSMT BATTERY VOLTAGE: 3.02 V
MDC IDC MSMT LEADCHNL RA IMPEDANCE VALUE: 400 Ohm
MDC IDC SET LEADCHNL RV PACING PULSEWIDTH: 0.5 ms
MDC IDC SET ZONE DETECTION INTERVAL: 250 ms
Zone Setting Detection Interval: 300 ms
Zone Setting Detection Interval: 350 ms

## 2014-09-17 ENCOUNTER — Encounter: Payer: Self-pay | Admitting: *Deleted

## 2014-11-09 ENCOUNTER — Ambulatory Visit: Payer: Self-pay | Admitting: Cardiovascular Disease

## 2014-11-09 DIAGNOSIS — Z952 Presence of prosthetic heart valve: Secondary | ICD-10-CM

## 2014-11-12 ENCOUNTER — Ambulatory Visit (INDEPENDENT_AMBULATORY_CARE_PROVIDER_SITE_OTHER): Payer: Managed Care, Other (non HMO) | Admitting: Cardiology

## 2014-11-12 ENCOUNTER — Encounter: Payer: Self-pay | Admitting: Cardiology

## 2014-11-12 VITALS — BP 102/64 | HR 60 | Ht 73.0 in | Wt 225.5 lb

## 2014-11-12 DIAGNOSIS — I255 Ischemic cardiomyopathy: Secondary | ICD-10-CM

## 2014-11-12 DIAGNOSIS — E878 Other disorders of electrolyte and fluid balance, not elsewhere classified: Secondary | ICD-10-CM

## 2014-11-12 LAB — BASIC METABOLIC PANEL
BUN: 23 mg/dL (ref 6–23)
CHLORIDE: 106 meq/L (ref 96–112)
CO2: 28 meq/L (ref 19–32)
CREATININE: 1.3 mg/dL (ref 0.50–1.35)
Calcium: 9.2 mg/dL (ref 8.4–10.5)
Glucose, Bld: 89 mg/dL (ref 70–99)
Potassium: 4.6 mEq/L (ref 3.5–5.3)
SODIUM: 143 meq/L (ref 135–145)

## 2014-11-12 NOTE — Progress Notes (Signed)
HPI The patient presents for evaluation after CABG and mitral valve replacement. He presented late after myocardial infarction. 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 He ultimately required CABG with bioprosthetic mitral valve replacement. He had a resultant EF of 30-35%.  We had discussed an ICD and he was considering this.   He suffered cardiac arrest on 2/22 with VT/VF.  Follow up cath demonstrated patent bypass grafts.  He initially went home with a Life Vest.  He subsequently returned for ICD placement on 12/03/13.  The most recent EF at the time of the arrest was 15%.  He had a repeat EF today and this EF appears to be about 20%.  I reviewed these images.  He is s/p cardioversion and was maintaining NSR.   Since I last saw him he has done well.  The patient denies any new symptoms such as chest discomfort, neck or arm discomfort. There has been no new shortness of breath, PND or orthopnea. There have been no reported palpitations, presyncope or syncope.   He is back at work.    No Known Allergies  Current Outpatient Prescriptions  Medication Sig Dispense Refill  . acetaminophen (TYLENOL) 500 MG tablet Take 500 mg by mouth as needed.    Marland Kitchen apixaban (ELIQUIS) 5 MG TABS tablet Take 1 tablet (5 mg total) by mouth 2 (two) times daily. 60 tablet 11  . atorvastatin (LIPITOR) 80 MG tablet Take 80 mg by mouth daily at 6 PM.    . bisoprolol (ZEBETA) 10 MG tablet Take 1 tablet (10 mg total) by mouth daily. 30 tablet 11  . furosemide (LASIX) 40 MG tablet Take 1 tablet (40 mg total) by mouth daily. 30 tablet 6  . hydrALAZINE (APRESOLINE) 25 MG tablet Take 1 tablet (25 mg total) by mouth every 8 (eight) hours. 90 tablet 6  . isosorbide mononitrate (IMDUR) 30 MG 24 hr tablet Take 1 tablet (30 mg total) by mouth daily. 30 tablet 6  . nitroGLYCERIN (NITROSTAT) 0.4 MG SL tablet Place 1 tablet (0.4 mg total) under the tongue every 5 (five) minutes as needed for chest pain. 25  tablet 3  . omeprazole (PRILOSEC) 20 MG capsule Take 20 mg by mouth daily.     No current facility-administered medications for this visit.    Past Medical History  Diagnosis Date  . High cholesterol   . Hypertension   . Constipation   . Reflux   . Complication of anesthesia     "Hard time waking me up" after sedation after dental procedure  . CAD (coronary artery disease)     LAD 95% proximal stenosis, D1 50-60% stenosis, the circumflex 40% stenosis, RCA subtotal stenosis.  . Cardiomyopathy, ischemic     EF was 30-35% by echo but 45-50% by cath.  Most recent EF 15%.    . Mitral regurgitation     Secondary to papillary muscle rupture  . H/O cardiac arrest   . Atrial tachycardia     Past Surgical History  Procedure Laterality Date  . Stomach ulcer repair    . Tee without cardioversion N/A 02/04/2013    Procedure: TRANSESOPHAGEAL ECHOCARDIOGRAM (TEE);  Surgeon: Vesta Mixer, MD;  Location: [facility] ENDOSCOPY;  Service: Cardiovascular;  Laterality: N/A;  . Intraoperative transesophageal echocardiogram N/A 02/05/2013    Procedure: INTRAOPERATIVE TRANSESOPHAGEAL ECHOCARDIOGRAM;  Surgeon: [name], MD;  Location: [facility] OR;  Service: Open Heart Surgery;  Laterality: N/A;  . Endovein harvest of greater saphenous vein Right 02/05/2013  Procedure: ENDOVEIN HARVEST OF GREATER SAPHENOUS VEIN;  Surgeon: Loreli SlotSteven C Hendrickson, MD;  [facility];  Service: Open Heart Surgery;  Laterality: Right;  . Patent foramen ovale closure N/A 02/05/2013    Procedure: PATENT FORAMEN OVALE CLOSURE;  Surgeon: Loreli SlotSteven C Hendrickson, MD;  [facility];  Service: Open Heart Surgery;  Laterality: N/A;  . Mitral valve replacement (mvr)/coronary artery bypass grafting (cabg) N/A 02/05/2013    Procedure: MITRAL VALVE REPLACEMENT (MVR)/CORONARY ARTERY BYPASS GRAFTING (CABG);  Surgeon: Loreli SlotSteven C Hendrickson, MD;  [facility];  Service: Open Heart Surgery;  Laterality: N/A;  x3 using right greater saphenous  vein and left internal mammary.   . Coronary artery bypass graft    . Implantable cardioverter defibrillator implant  12/03/2013    STJ Fortify ICD implanted for secondary prevention by Dr [name]  . Cardioversion N/A 01/25/2014    Procedure: CARDIOVERSION;  Surgeon: Wendall StadePeter C Nishan, MD;  [facility];  Service: Cardiovascular;  Laterality: N/A;  . Left heart catheterization with coronary angiogram N/A 02/03/2013    Procedure: LEFT HEART CATHETERIZATION WITH CORONARY ANGIOGRAM;  Surgeon: Iran OuchMuhammad A Arida, MD;  [facility]: [facility] CATH LAB;  Service: Cardiovascular;  Laterality: N/A;  . Left heart catheterization with coronary/graft angiogram N/A 11/01/2013    Procedure: LEFT HEART CATHETERIZATION WITH Isabel CapriceORONARY/GRAFT ANGIOGRAM;  Surgeon: Peter M SwazilandJordan, MD;  [facility];  Service: Cardiovascular;  Laterality: N/A;  . Implantable cardioverter defibrillator implant N/A 12/03/2013    Procedure: IMPLANTABLE CARDIOVERTER DEFIBRILLATOR IMPLANT;  Surgeon: Gardiner RhymeJames D Allred, MD;  [facility];  Service: Cardiovascular;  Laterality: N/A;    ROS:  As stated in the HPI and negative for all other systems.   PHYSICAL EXAM BP 102/64 mmHg  Pulse 60  Ht 6\' 1"  (1.854 m)  Wt 225 lb 8 oz (102.286 kg)  BMI 29.76 kg/m2 GENERAL:  Well appearing NECK:  No JVD, waveform within normal limits, carotid upstroke brisk and symmetric, no bruits, no thyromegaly LUNGS:  Slightly diminished breath sounds at the bases BACK:  No CVA tenderness CHEST:  Well healed sternotomy scar, well healed ICD scar.  HEART:  PMI not displaced or sustained,S1 and S2 within normal limits, no S3, no S4, no clicks, no rubs, soft apical systolic murmur nonradiating, no diastolic murmurs ABD:  Flat, positive bowel sounds normal in frequency in pitch, no bruits, no rebound, no guarding, no midline pulsatile mass, no hepatomegaly, no splenomegaly EXT:  2 plus pulses throughout, no lower extremity edema, no cyanosis no  clubbing  EKG:  Sinus rhythm, atrio/ventricular pacing, rate 60.  11/12/2014  ASSESSMENT AND PLAN  CARDIOMYOPATHY:   The patient's blood pressure is too low. We will not be able to titrate his meds. I will check an echocardiogram in August which will be one year from his previous. He seems to be euvolemic.  MVR: He has stable valve prosthesis. As above I will be checking an echo.  CAD:  The patient is having no symptoms. No further cardiovascular testing is suggested. He will continue with risk reduction.  ATRIAL TACHYCARDIA:  He has follow up with Dr. [name] again in September. He told him to stay on his anticoagulation for now.  ICD:  He is up to date and will continue to follow up and he has an upcoming appt with Dr. [name]  CKD:  I will check a BMET.

## 2014-11-12 NOTE — Patient Instructions (Signed)
Your physician recommends that you schedule a follow-up appointment in: one year with Dr. Antoine PocheHochrein  We have ordered an Echo for you to get done in April 2017

## 2014-11-19 ENCOUNTER — Ambulatory Visit (HOSPITAL_COMMUNITY)
Admission: RE | Admit: 2014-11-19 | Discharge: 2014-11-19 | Disposition: A | Discharge status: Home / Self Care | Payer: Managed Care, Other (non HMO) | Source: Ambulatory Visit | Attending: Internal Medicine | Admitting: Internal Medicine

## 2014-11-19 DIAGNOSIS — I10 Essential (primary) hypertension: Secondary | ICD-10-CM | POA: Diagnosis not present

## 2014-11-19 DIAGNOSIS — E785 Hyperlipidemia, unspecified: Secondary | ICD-10-CM | POA: Diagnosis not present

## 2014-11-19 DIAGNOSIS — I255 Ischemic cardiomyopathy: Secondary | ICD-10-CM

## 2014-11-19 NOTE — Progress Notes (Signed)
2D Echocardiogram Complete.  11/19/2014   Mirza Fessel, RDCS  

## 2014-12-07 ENCOUNTER — Encounter: Payer: Managed Care, Other (non HMO) | Admitting: *Deleted

## 2014-12-07 ENCOUNTER — Telehealth: Payer: Self-pay | Admitting: Cardiology

## 2014-12-07 NOTE — Telephone Encounter (Signed)
Spoke with pt and reminded pt of remote transmission that is due today. Pt verbalized understanding.   

## 2014-12-08 ENCOUNTER — Encounter: Payer: Self-pay | Admitting: Cardiology

## 2014-12-15 ENCOUNTER — Other Ambulatory Visit: Payer: Self-pay

## 2014-12-15 MED ORDER — HYDRALAZINE HCL 25 MG PO TABS: 25.0000 mg | ORAL_TABLET | Freq: Three times a day (TID) | ORAL | Status: DC

## 2014-12-15 MED ORDER — ATORVASTATIN CALCIUM 80 MG PO TABS: 80.0000 mg | ORAL_TABLET | Freq: Every day | ORAL | Status: DC

## 2014-12-15 MED ORDER — FUROSEMIDE 40 MG PO TABS: 40.0000 mg | ORAL_TABLET | Freq: Every day | ORAL | Status: DC

## 2014-12-15 MED ORDER — ISOSORBIDE MONONITRATE ER 30 MG PO TB24: 30.0000 mg | ORAL_TABLET | Freq: Every day | ORAL | Status: DC

## 2015-01-21 ENCOUNTER — Ambulatory Visit (INDEPENDENT_AMBULATORY_CARE_PROVIDER_SITE_OTHER): Payer: Managed Care, Other (non HMO) | Admitting: Neurology

## 2015-01-21 ENCOUNTER — Encounter: Payer: Self-pay | Admitting: Neurology

## 2015-01-21 VITALS — BP 122/72 | HR 78 | Resp 18

## 2015-01-21 DIAGNOSIS — I63432 Cerebral infarction due to embolism of left posterior cerebral artery: Secondary | ICD-10-CM | POA: Diagnosis not present

## 2015-01-21 DIAGNOSIS — Z8673 Personal history of transient ischemic attack (TIA), and cerebral infarction without residual deficits: Secondary | ICD-10-CM

## 2015-01-21 DIAGNOSIS — G3184 Mild cognitive impairment, so stated: Secondary | ICD-10-CM

## 2015-01-21 DIAGNOSIS — I639 Cerebral infarction, unspecified: Secondary | ICD-10-CM

## 2015-01-21 DIAGNOSIS — I6930 Unspecified sequelae of cerebral infarction: Secondary | ICD-10-CM

## 2015-01-21 NOTE — Progress Notes (Signed)
Provider:  Melvyn Novas, M D  Referring Provider: Karleen Hampshire, MD Primary Care Physician:  Kirk Ruths, MD  Chief Complaint  Patient presents with  . periperal vision gone    rm 10, new patient, with wife    HPI:  Lonnie [name] is a 69 y.o. male , caucasian , right handed, married  seen here as a referral  from Dr. Regino Schultze and optometrist  [facility] in [place].  Mr. Pascucci is referred to me today after he had seen his local optometrist Dr. Mayford Knife for a visual field examination which revealed homonymous hemianopsia. He lost a visual field to the left in both eyes. Field of the left eye and the temporal field of the right eye. Looking through the chart I advised the patient of this is usually related to a stroke. I learned from Mr. and Mrs. Bogden that Mr. Sopp has a long history of cardiac disease coronary artery disease he underwent a triple bypass in 2014 and had to have a heart valve replaced. He had a pacemaker implanted in 2015 and in February 2015 he suffered a cardiac arrest and had to be resuscitated. hospital notes from Dr. Antoine Poche,  his cardiologist in the Epic system.  The patient himself became only aware of blurred vision double vision visual difficulties after 2014 myocardial infarction. He felt his vision was at first blurred, lines were 'swimming ". He felt he improved, was resuming to drive and go to work in October 2015 . He works  In a Scientist, physiological. His work has not changed , his work place does not require full visual fields.  It was not until recently that he saw Dr. Mayford Knife and it was confirmed that he had a visual field impairment.   The study by Dr. Mayford Knife in [place] was performed on 01-13-15. There has been no recent change in his vision and I assume that the findings are related to remote cerebrovascular insults related to his cardiac history. He reports no recent neurologic changes at least none since February 2015. 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          Review of Systems: Out of a complete 14 system review, the patient complains of only the following symptoms, and all other reviewed systems are negative.  vision changes, slight slurring of the speech.   History   Social History  . Marital Status: Married    Spouse Name: N/A  . Number of Children: N/A  . Years of Education: N/A   Occupational History  . Not on file.   Social History Main Topics  . Smoking status: Former Smoker    Types: Cigarettes    Quit date: 06/04/1974  . Smokeless tobacco: Not on file     Comment: 40 yrs ago  . Alcohol Use: Yes     Comment: week end - 1/5 of crown- 9/25 ocassionally-AJ  . Drug Use: No  . Sexual Activity: Not on file   Other Topics Concern  . Not on file   Social History Narrative   Drinks about five cups of caffeine a day.    Family History  Problem Relation Age of Onset  . Heart attack Mother 73    Past Medical History  Diagnosis Date  . High cholesterol   . Hypertension   . Constipation   . Reflux   . Complication of anesthesia     "Hard time waking  me up" after sedation after dental procedure  . CAD (coronary artery disease)     LAD 95% proximal stenosis, D1 50-60% stenosis, the circumflex 40% stenosis, RCA subtotal stenosis.  . Cardiomyopathy, ischemic     EF was 30-35% by echo but 45-50% by cath.  Most recent EF 15%.    . Mitral regurgitation     Secondary to papillary muscle rupture  . H/O cardiac arrest   . Atrial tachycardia     Past Surgical History  Procedure Laterality Date  . Stomach ulcer repair    . Tee without cardioversion N/A 02/04/2013    Procedure: TRANSESOPHAGEAL ECHOCARDIOGRAM (TEE);  Surgeon: Vesta Mixer, MD;  Location[facility] ENDOSCOPY;  Service: Cardiovascular;  Laterality: N/A;  . Intraoperative transesophageal echocardiogram N/A 02/05/2013    Procedure: INTRAOPERATIVE TRANSESOPHAGEAL ECHOCARDIOGRAM;   Surgeon: [name], MD;  Location: [facility] OR;  Service: Open Heart Surgery;  Laterality: N/A;  . Endovein harvest of greater saphenous vein Right 02/05/2013    Procedure: ENDOVEIN HARVEST OF GREATER SAPHENOUS VEIN;  Surgeon: [name], MD;  Location: [facility] OR;  Service: Open Heart Surgery;  Laterality: Right;  . Patent foramen ovale closure N/A 02/05/2013    Procedure: PATENT FORAMEN OVALE CLOSURE;  Surgeon: [name], MD;  Location: [facility] OR;  Service: Open Heart Surgery;  Laterality: N/A;  . Mitral valve replacement (mvr)/coronary artery bypass grafting (cabg) N/A 02/05/2013    Procedure: MITRAL VALVE REPLACEMENT (MVR)/CORONARY ARTERY BYPASS GRAFTING (CABG);  Surgeon: [name], MD;  Location: [facility] OR;  Service: Open Heart Surgery;  Laterality: N/A;  x3 using right greater saphenous vein and left internal mammary.   . Coronary artery bypass graft    . Implantable cardioverter defibrillator implant  12/03/2013    STJ Fortify ICD implanted for secondary prevention by Dr Johney Frame  . Cardioversion N/A 01/25/2014    Procedure: CARDIOVERSION;  Surgeon: Wendall Stade, MD;  Location: [facility] ENDOSCOPY;  Service: Cardiovascular;  Laterality: N/A;  . Left heart catheterization with coronary angiogram N/A 02/03/2013    Procedure: LEFT HEART CATHETERIZATION WITH CORONARY ANGIOGRAM;  Surgeon: Iran Ouch, MD;  Location: [facility] CATH LAB;  Service: Cardiovascular;  Laterality: N/A;  . Left heart catheterization with coronary/graft angiogram N/A 11/01/2013    Procedure: LEFT HEART CATHETERIZATION WITH Isabel Caprice;  Surgeon: Peter M Swaziland, MD;  Location: [facility] CATH LAB;  Service: Cardiovascular;  Laterality: N/A;  . Implantable cardioverter defibrillator implant N/A 12/03/2013    Procedure: IMPLANTABLE CARDIOVERTER DEFIBRILLATOR IMPLANT;  Surgeon: Gardiner Rhyme, MD;  Location: [facility] C[facility] LAB;  Service: Cardiovascular;  Laterality: N/A;    Current Outpatient Prescriptions  Medication  Sig Dispense Refill  . acetaminophen (TYLENOL) 500 MG tablet Take 500 mg by mouth as needed.    Marland Kitchen apixaban (ELIQUIS) 5 MG TABS tablet Take 1 tablet (5 mg total) by mouth 2 (two) times daily. 60 tablet 11  . atorvastatin (LIPITOR) 80 MG tablet Take 1 tablet (80 mg total) by mouth daily at 6 PM. 30 tablet 11  . bisoprolol (ZEBETA) 10 MG tablet Take 1 tablet (10 mg total) by mouth daily. 30 tablet 11  . furosemide (LASIX) 40 MG tablet Take 1 tablet (40 mg total) by mouth daily. 30 tablet 11  . hydrALAZINE (APRESOLINE) 25 MG tablet Take 1 tablet (25 mg total) by mouth every 8 (eight) hours. 90 tablet 11  . isosorbide mononitrate (IMDUR) 30 MG 24 hr tablet Take 1 tablet (30 mg total) by mouth daily. 30  tablet 11  . nitroGLYCERIN (NITROSTAT) 0.4 MG SL tablet Place 1 tablet (0.4 mg total) under the tongue every 5 (five) minutes as needed for chest pain. 25 tablet 3  . omeprazole (PRILOSEC) 20 MG capsule Take 20 mg by mouth daily.     No current facility-administered medications for this visit.    Allergies as of 01/21/2015  . (No Known Allergies)    Vitals: BP 122/72 mmHg  Pulse 78  Resp 18 Last Weight:  Wt Readings from Last 1 Encounters:  11/12/14 225 lb 8 oz (102.286 kg)   Last Height:   Ht Readings from Last 1 Encounters:  11/12/14 6\' 1"  (1.854 m)    Physical exam:  General: The patient is awake, alert and appears not in acute distress. The patient is well groomed. Head: Normocephalic, atraumatic. Neck is supple. Mallampati 4 , neck circumference: 16 Cardiovascular:  - paced , with murmur or carotid bruit, and without distended neck veins. Respiratory: Lungs are clear to auscultation. Skin:  Without evidence of edema, or rash Trunk: BMI is elevated and patient  has normal posture.  Neurologic exam : The patient is awake and alert, oriented to place and time.  Memory subjective  described as intact.  The patient is quiet and not able to answer questions in the time context, his  wife jumps to help.  There is a normal attention span & concentration ability. Speech is  non fluent with aphasia. Mood and affect are appropriate.  Cranial nerves: Pupils ; patient's left pupil does not respond to light it stays at 2 mm the right pupil is reactive to light. He has a homonymous hemianopsia Hearing to finger rub intact.  Facial sensation intact to fine touch. Facial motor strength is symmetric and tongue and uvula move midline. Tongue protrusion into either cheek is normal. Shoulder shrug is normal.   Motor exam:  Weakness of the grip bilaterally.  Sensory:  Fine touch, pinprick and vibration were tested in all extremities. Proprioception was normal.  Coordination: Rapid alternating movements in the fingers/hands were normal.  Finger-to-nose maneuver with evidence of ataxia, dysmetria on the left - ataxia  Gait and station: Patient walks without assistive device and is able to climb up to the exam table. Strength within normal limits.  Stance is stable and normal. Tandem gait is unfragmented, but he is clumsier . Romberg testing is negative   Deep tendon reflexes: in the  upper and lower extremities are symmetric and intact.  Babinski maneuver response is upgoing on the right.    Assessment:  After physical and neurologic examination, review of laboratory studies, imaging, neurophysiology testing and pre-existing records, assessment is that of :  left-sided dysmetria on finger-nose,  his left leg is clumsier when he walks and when he turns - he needs more steps and when he turns to the right.  He has no associated hearing loss tinnitus change of taste or tender of smell.  He does have some problems with learning new things since last February his memory is more delayed almost sluggish.   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 This explains his hemianopsia. In addition he may have had additional embolic events or embolic  lesions from the same event or watershed infarcts given that he had a cardiac condition and was under a pump for a significant amount of time. Optimal would be to obtain an MRI which is not possible in her defibrillator-pacemaker patient for  that reason I will order a CT scan of the head. His left-sided dysmetria ataxia of the left clumsiness of the leg and the inability of the left pupil to constrict or dilated to light all part of the syndrome.    Plan:  Treatment plan and additional workup :  No no treatment is known 1415 months after the causative event. I had to break the news to Mr. Daphine DeutscherMartin and his wife that he would not be allowed to drive any longer since homonymous hemianopsia is considered a condition that disabled him from driving. By his job description he seems to be able to continue at his workplace and he has not encountered any difficulty seeing or any injuries etc. His wife also driver's license and is able to drive. He lives about 4 miles away from his workplace. We will order the CT of the head today and I will contact him with the results as soon as available.     Porfirio Mylararmen Crosby Oriordan MD 01/21/2015

## 2015-01-21 NOTE — Patient Instructions (Signed)
Ischemic Stroke °A stroke (cerebrovascular accident) is the sudden death of brain tissue. It is a medical emergency. A stroke can cause permanent loss of brain function. This can cause problems with different parts of your body. A transient ischemic attack (TIA) is different because it does not cause permanent damage. A TIA is a short-lived problem of poor blood flow affecting a part of the brain. A TIA is also a serious problem because having a TIA greatly increases the chances of having a stroke. When symptoms first develop, you cannot know if the problem might be a stroke or a TIA. °CAUSES  °A stroke is caused by a decrease of oxygen supply to an area of your brain. It is usually the result of a small blood clot or collection of cholesterol or fat (plaque) that blocks blood flow in the brain. A stroke can also be caused by blocked or damaged carotid arteries.  °RISK FACTORS °· High blood pressure (hypertension). °· High cholesterol. °· Diabetes mellitus. °· Heart disease. °· The buildup of plaque in the blood vessels (peripheral artery disease or atherosclerosis). °· The buildup of plaque in the blood vessels providing blood and oxygen to the brain (carotid artery stenosis). °· An abnormal heart rhythm (atrial fibrillation). °· Obesity. °· Smoking. °· Taking oral contraceptives (especially in combination with smoking). °· Physical inactivity. °· A diet high in fats, salt (sodium), and calories. °· Alcohol use. °· Use of illegal drugs (especially cocaine and methamphetamine). °· Being African American. °· Being over the age of 55. °· Family history of stroke. °· Previous history of blood clots, stroke, TIA, or heart attack. °· Sickle cell disease. °SYMPTOMS  °These symptoms usually develop suddenly, or may be newly present upon awakening from sleep: °· Sudden weakness or numbness of the face, arm, or leg, especially on one side of the body. °· Sudden trouble walking or difficulty moving arms or legs. °· Sudden  confusion. °· Sudden personality changes. °· Trouble speaking (aphasia) or understanding. °· Difficulty swallowing. °· Sudden trouble seeing in one or both eyes. °· Double vision. °· Dizziness. °· Loss of balance or coordination. °· Sudden severe headache with no known cause. °· Trouble reading or writing. °DIAGNOSIS  °Your health care provider can often determine the presence or absence of a stroke based on your symptoms, history, and physical exam. Computed tomography (CT) of the brain is usually performed to confirm the stroke, determine causes, and determine stroke severity. Other tests may be done to find the cause of the stroke. These tests may include: °· Electrocardiography. °· Continuous heart monitoring. °· Echocardiography. °· Carotid ultrasonography. °· Magnetic resonance imaging (MRI). °· A scan of the brain circulation. °· Blood tests. °PREVENTION  °The risk of a stroke can be decreased by appropriately treating high blood pressure, high cholesterol, diabetes, heart disease, and obesity and by quitting smoking, limiting alcohol, and staying physically active. °TREATMENT  °Time is of the essence. It is important to seek treatment at the first sign of these symptoms because you may receive a medicine to dissolve the clot (thrombolytic) that cannot be given if too much time has passed since your symptoms began. Even if you do not know when your symptoms began, get treatment as soon as possible as there are other treatment options available including oxygen, intravenous (IV) fluids, and medicines to thin the blood (anticoagulants). Treatment of stroke depends on the duration, severity, and cause of your symptoms. Medicines and dietary changes may be used to address diabetes, high blood   pressure, and other risk factors. Physical, speech, and occupational therapists will assess you and work with you to improve any functions impaired by the stroke. Measures will be taken to prevent short-term and long-term  complications, including infection from breathing foreign material into the lungs (aspiration pneumonia), blood clots in the legs, bedsores, and falls. Rarely, surgery may be needed to remove large blood clots or to open up blocked arteries. °HOME CARE INSTRUCTIONS  °· Take medicines only as directed by your health care provider. Follow the directions carefully. Medicines may be used to control risk factors for a stroke. Be sure you understand all your medicine instructions. °· You may be told to take a medicine to thin the blood, such as aspirin or the anticoagulant warfarin. Warfarin needs to be taken exactly as instructed. °¨ Too much and too little warfarin are both dangerous. Too much warfarin increases the risk of bleeding. Too little warfarin continues to allow the risk for blood clots. While taking warfarin, you will need to have regular blood tests to measure your blood clotting time. These blood tests usually include both the PT and INR tests. The PT and INR results allow your health care provider to adjust your dose of warfarin. The dose can change for many reasons. It is critically important that you take warfarin exactly as prescribed, and that you have your PT and INR levels drawn exactly as directed. °¨ Many foods, especially foods high in vitamin K, can interfere with warfarin and affect the PT and INR results. Foods high in vitamin K include spinach, kale, broccoli, cabbage, collard and turnip greens, brussels sprouts, peas, cauliflower, seaweed, and parsley, as well as beef and pork liver, green tea, and soybean oil. You should eat a consistent amount of foods high in vitamin K. Avoid major changes in your diet, or notify your health care provider before changing your diet. Arrange a visit with a dietitian to answer your questions. °¨ Many medicines can interfere with warfarin and affect the PT and INR results. You must tell your health care provider about any and all medicines you take. This  includes all vitamins and supplements. Be especially cautious with aspirin and anti-inflammatory medicines. Do not take or discontinue any prescribed or over-the-counter medicine except on the advice of your health care provider or pharmacist. °¨ Warfarin can have side effects, such as excessive bruising or bleeding. You will need to hold pressure over cuts for longer than usual. Your health care provider or pharmacist will discuss other potential side effects. °¨ Avoid sports or activities that may cause injury or bleeding. °¨ Be mindful when shaving, flossing your teeth, or handling sharp objects. °¨ Alcohol can change the body's ability to handle warfarin. It is best to avoid alcoholic drinks or consume only very small amounts while taking warfarin. Notify your health care provider if you change your alcohol intake. °¨ Notify your dentist or other health care providers before procedures. °· If swallow studies have determined that your swallowing reflex is present, you should eat healthy foods. Including 5 or more servings of fruits and vegetables a day may reduce the risk of stroke. Foods may need to be a certain consistency (soft or pureed), or small bites may need to be taken in order to avoid aspirating or choking. Certain dietary changes may be advised to address high blood pressure, high cholesterol, diabetes, or obesity. °¨ Food choices that are low in sodium, saturated fat, trans fat, and cholesterol are recommended to manage high blood pressure. °¨   Food choies that are high in fiber, and low in saturated fat, trans fat, and cholesterol may control cholesterol levels. °¨ Controlling carbohydrates and sugar intake is recommended to manage diabetes. °¨ Reducing calorie intake and making food choices that are low in sodium, saturated fat, trans fat, and cholesterol are recommended to manage obesity. °· Maintain a healthy weight. °· Stay physically active. It is recommended that you get at least 30 minutes of  activity on all or most days. °· Do not use any tobacco products including cigarettes, chewing tobacco, or electronic cigarettes. °· Limit alcohol use even if you are not taking warfarin. Moderate alcohol use is considered to be: °¨ No more than 2 drinks each day for men. °¨ No more than 1 drink each day for nonpregnant women. °· Home safety. A safe home environment is important to reduce the risk of falls. Your health care provider may arrange for specialists to evaluate your home. Having grab bars in the bedroom and bathroom is often important. Your health care provider may arrange for equipment to be used at home, such as raised toilets and a seat for the shower. °· Physical, occupational, and speech therapy. Ongoing therapy may be needed to maximize your recovery after a stroke. If you have been advised to use a walker or a cane, use it at all times. Be sure to keep your therapy appointments. °· Follow all instructions for follow-up with your health care provider. This is very important. This includes any referrals, physical therapy, rehabilitation, and lab tests. Proper follow-up can prevent another stroke from occurring. °SEEK MEDICAL CARE IF: °· You have personality changes. °· You have difficulty swallowing. °· You are seeing double. °· You have dizziness. °· You have a fever. °· You have skin breakdown. °SEEK IMMEDIATE MEDICAL CARE IF:  °Any of these symptoms may represent a serious problem that is an emergency. Do not wait to see if the symptoms will go away. Get medical help right away. Call your local emergency services (911 in [place] Do not drive yourself to the hospital. °· You have sudden weakness or numbness of the face, arm, or leg, especially on one side of the body. °· You have sudden trouble walking or difficulty moving arms or legs. °· You have sudden confusion. °· You have trouble speaking (aphasia) or understanding. °· You have sudden trouble seeing in one or both eyes. °· You have a loss of  balance or coordination. °· You have a sudden, severe headache with no known cause. °· You have new chest pain or an irregular heartbeat. °· You have a partial or total loss of consciousness. °Document Released: 08/27/2005 Document Revised: 01/11/2014 Document Reviewed: 04/06/2012 °[employer]® Patient Information ©2015 [employer]. This information is not intended to replace advice given to you by your health care provider. Make sure you discuss any questions you have with your health care provider. ° °

## 2015-02-15 ENCOUNTER — Ambulatory Visit (INDEPENDENT_AMBULATORY_CARE_PROVIDER_SITE_OTHER): Payer: Managed Care, Other (non HMO) | Admitting: *Deleted

## 2015-02-15 ENCOUNTER — Encounter: Payer: Self-pay | Admitting: Internal Medicine

## 2015-02-15 DIAGNOSIS — I5022 Chronic systolic (congestive) heart failure: Secondary | ICD-10-CM

## 2015-02-15 DIAGNOSIS — I255 Ischemic cardiomyopathy: Secondary | ICD-10-CM | POA: Diagnosis not present

## 2015-02-17 NOTE — Progress Notes (Signed)
Remote ICD transmission.   

## 2015-02-18 ENCOUNTER — Ambulatory Visit: Payer: Managed Care, Other (non HMO) | Admitting: Nurse Practitioner

## 2015-02-20 LAB — CUP PACEART REMOTE DEVICE CHECK
Brady Statistic AP VP Percent: 66 %
Brady Statistic AP VS Percent: 1 %
Brady Statistic RA Percent Paced: 66 %
Brady Statistic RV Percent Paced: 99 %
Date Time Interrogation Session: 20160607202107
HIGH POWER IMPEDANCE MEASURED VALUE: 68 Ohm
HighPow Impedance: 68 Ohm
Lead Channel Pacing Threshold Amplitude: 0.75 V
Lead Channel Pacing Threshold Pulse Width: 0.5 ms
Lead Channel Pacing Threshold Pulse Width: 0.5 ms
Lead Channel Setting Pacing Pulse Width: 0.5 ms
Lead Channel Setting Sensing Sensitivity: 0.5 mV
MDC IDC MSMT BATTERY REMAINING LONGEVITY: 72 mo
MDC IDC MSMT BATTERY REMAINING PERCENTAGE: 82 %
MDC IDC MSMT BATTERY VOLTAGE: 2.98 V
MDC IDC MSMT LEADCHNL RA IMPEDANCE VALUE: 450 Ohm
MDC IDC MSMT LEADCHNL RA SENSING INTR AMPL: 5 mV
MDC IDC MSMT LEADCHNL RV IMPEDANCE VALUE: 490 Ohm
MDC IDC MSMT LEADCHNL RV PACING THRESHOLD AMPLITUDE: 1 V
MDC IDC MSMT LEADCHNL RV SENSING INTR AMPL: 11.7 mV
MDC IDC SET LEADCHNL RA PACING AMPLITUDE: 2 V
MDC IDC SET LEADCHNL RV PACING AMPLITUDE: 2.5 V
MDC IDC SET ZONE DETECTION INTERVAL: 250 ms
MDC IDC STAT BRADY AS VP PERCENT: 34 %
MDC IDC STAT BRADY AS VS PERCENT: 1 %
Pulse Gen Serial Number: 7175865
Zone Setting Detection Interval: 300 ms
Zone Setting Detection Interval: 350 ms

## 2015-02-27 ENCOUNTER — Other Ambulatory Visit: Payer: Self-pay | Admitting: Internal Medicine

## 2015-02-28 ENCOUNTER — Other Ambulatory Visit: Payer: Self-pay

## 2015-02-28 MED ORDER — APIXABAN 5 MG PO TABS: 5.0000 mg | ORAL_TABLET | Freq: Two times a day (BID) | ORAL | Status: DC

## 2015-03-01 ENCOUNTER — Encounter: Payer: Self-pay | Admitting: Cardiology

## 2015-05-13 ENCOUNTER — Encounter: Payer: Managed Care, Other (non HMO) | Admitting: Internal Medicine

## 2015-05-31 ENCOUNTER — Other Ambulatory Visit: Payer: Self-pay | Admitting: Cardiology

## 2015-06-17 ENCOUNTER — Ambulatory Visit (INDEPENDENT_AMBULATORY_CARE_PROVIDER_SITE_OTHER): Payer: Managed Care, Other (non HMO) | Admitting: Internal Medicine

## 2015-06-17 ENCOUNTER — Telehealth: Payer: Self-pay

## 2015-06-17 ENCOUNTER — Encounter: Payer: Self-pay | Admitting: Internal Medicine

## 2015-06-17 VITALS — BP 126/76 | HR 64 | Ht 72.0 in | Wt 221.8 lb

## 2015-06-17 DIAGNOSIS — I10 Essential (primary) hypertension: Secondary | ICD-10-CM | POA: Diagnosis not present

## 2015-06-17 DIAGNOSIS — I472 Ventricular tachycardia, unspecified: Secondary | ICD-10-CM

## 2015-06-17 DIAGNOSIS — I471 Supraventricular tachycardia: Secondary | ICD-10-CM

## 2015-06-17 DIAGNOSIS — I469 Cardiac arrest, cause unspecified: Secondary | ICD-10-CM

## 2015-06-17 DIAGNOSIS — I255 Ischemic cardiomyopathy: Secondary | ICD-10-CM

## 2015-06-17 LAB — CUP PACEART INCLINIC DEVICE CHECK
Battery Remaining Longevity: 66 mo
Brady Statistic RA Percent Paced: 69 %
Date Time Interrogation Session: 20161007104406
HighPow Impedance: 65.25 Ohm
Lead Channel Impedance Value: 437.5 Ohm
Lead Channel Pacing Threshold Amplitude: 1.25 V
Lead Channel Setting Pacing Amplitude: 2 V
Lead Channel Setting Pacing Pulse Width: 0.5 ms
Lead Channel Setting Sensing Sensitivity: 0.5 mV
MDC IDC MSMT LEADCHNL RA PACING THRESHOLD AMPLITUDE: 0.75 V
MDC IDC MSMT LEADCHNL RA PACING THRESHOLD PULSEWIDTH: 0.5 ms
MDC IDC MSMT LEADCHNL RA SENSING INTR AMPL: 4.9 mV
MDC IDC MSMT LEADCHNL RV IMPEDANCE VALUE: 400 Ohm
MDC IDC MSMT LEADCHNL RV PACING THRESHOLD PULSEWIDTH: 0.5 ms
MDC IDC MSMT LEADCHNL RV SENSING INTR AMPL: 11.7 mV
MDC IDC PG SERIAL: 7175865
MDC IDC SET LEADCHNL RV PACING AMPLITUDE: 2.5 V
MDC IDC SET ZONE DETECTION INTERVAL: 250 ms
MDC IDC SET ZONE DETECTION INTERVAL: 300 ms
MDC IDC STAT BRADY RV PERCENT PACED: 99.55 %
Zone Setting Detection Interval: 350 ms

## 2015-06-17 MED ORDER — ASPIRIN EC 81 MG PO TBEC: 81.0000 mg | DELAYED_RELEASE_TABLET | Freq: Every day | ORAL | Status: DC

## 2015-06-17 NOTE — Telephone Encounter (Signed)
Call to patient and ICM enrollment/introduction given. Patient referred by Dr Johney Frame. Patient agreed to monthly ICM follow up and enrollment letter sent with 1st scheduled transmission on 07/20/2015.

## 2015-06-17 NOTE — Progress Notes (Signed)
PCP: Cassell Smiles., MD Primary Cardiologist:  Damyen Knoll is a 69 y.o. male who presents today for electrophysiology followup.  Since his last visit, the patient reports doing very well.    He did have recurrent VF with syncope 02/16/14 for which he was successfully defibrillated with his device.  He has had no further arrhythmias.  His atrial arrhythmias are also controlled. No afib or sustained atach in a year.  He is retiring soon.  Today, he denies symptoms of palpitations, chest pain, shortness of breath,  lower extremity edema, or dizziness.  The patient is otherwise without complaint today.   Past Medical History  Diagnosis Date  . High cholesterol   . Hypertension   . Constipation   . Reflux   . Complication of anesthesia     "Hard time waking me up" after sedation after dental procedure  . CAD (coronary artery disease)     LAD 95% proximal stenosis, D1 50-60% stenosis, the circumflex 40% stenosis, RCA subtotal stenosis.  . Cardiomyopathy, ischemic     EF was 30-35% by echo but 45-50% by cath.  Most recent EF 15%.    . Mitral regurgitation     Secondary to papillary muscle rupture  . H/O cardiac arrest   . Atrial tachycardia [facility])    Past Surgical History  Procedure Laterality Date  . Stomach ulcer repair    . Tee without cardioversion N/A 02/04/2013    Procedure: TRANSESOPHAGEAL ECHOCARDIOGRAM (TEE);  Surgeon: Vesta Mixer, MD;  Location: [facility] ENDOSCOPY;  Service: Cardiovascular;  Laterality: N/A;  . Intraoperative transesophageal echocardiogram N/A 02/05/2013    Procedure: INTRAOPERATIVE TRANSESOPHAGEAL ECHOCARDIOGRAM;  Surgeon: [name], MD;  Location: [facility] OR;  Service: Open Heart Surgery;  Laterality: N/A;  . Endovein harvest of greater saphenous vein Right 02/05/2013    Procedure: ENDOVEIN HARVEST OF GREATER SAPHENOUS VEIN;  Surgeon: [name], MD;  Location: [facility] OR;  Service: Open Heart Surgery;  Laterality: Right;  . Patent foramen  ovale closure N/A 02/05/2013    Procedure: PATENT FORAMEN OVALE CLOSURE;  Surgeon: [name], MD;  Location: [facility] OR;  Service: Open Heart Surgery;  Laterality: N/A;  . Mitral valve replacement (mvr)/coronary artery bypass grafting (cabg) N/A 02/05/2013    Procedure: MITRAL VALVE REPLACEMENT (MVR)/CORONARY ARTERY BYPASS GRAFTING (CABG);  Surgeon: [name], MD;  Location: [facility] OR;  Service: Open Heart Surgery;  Laterality: N/A;  x3 using right greater saphenous vein and left internal mammary.   . Coronary artery bypass graft    . Implantable cardioverter defibrillator implant  12/03/2013    STJ Fortify ICD implanted for secondary prevention by Dr Johney Frame  . Cardioversion N/A 01/25/2014    Procedure: CARDIOVERSION;  Surgeon: Wendall Stade, MD;  Location: [facility] ENDOSCOPY;  Service: Cardiovascular;  Laterality: N/A;  . Left heart catheterization with coronary angiogram N/A 02/03/2013    Procedure: LEFT HEART CATHETERIZATION WITH CORONARY ANGIOGRAM;  Surgeon: Iran Ouch, MD;  Location: [facility] CATH LAB;  Service: Cardiovascular;  Laterality: N/A;  . Left heart catheterization with coronary/graft angiogram N/A 11/01/2013    Procedure: LEFT HEART CATHETERIZATION WITH Isabel Caprice;  Surgeon: Peter M Swaziland, MD;  Location: [facility] CATH LAB;  Service: Cardiovascular;  Laterality: N/A;  . Implantable cardioverter defibrillator implant N/A 12/03/2013    Procedure: IMPLANTABLE CARDIOVERTER DEFIBRILLATOR IMPLANT;  Surgeon: Gardiner Rhyme, MD;  Location: [facility] CATH LAB;  Service: Cardiovascular;  Laterality: N/A;    Current Outpatient Prescriptions  Medication Sig  Dispense Refill  . acetaminophen (TYLENOL) 500 MG tablet Take 500 mg by mouth as needed.    Marland Kitchen apixaban (ELIQUIS) 5 MG TABS tablet Take 1 tablet (5 mg total) by mouth 2 (two) times daily. 60 tablet 3  . atorvastatin (LIPITOR) 80 MG tablet Take 1 tablet (80 mg total) by mouth daily at 6 PM. 30 tablet 11  . bisoprolol (ZEBETA) 10 MG  tablet TAKE ONE TABLET BY MOUTH ONCE DAILY 30 tablet 6  . furosemide (LASIX) 40 MG tablet Take 1 tablet (40 mg total) by mouth daily. 30 tablet 11  . hydrALAZINE (APRESOLINE) 25 MG tablet Take 1 tablet (25 mg total) by mouth every 8 (eight) hours. 90 tablet 11  . isosorbide mononitrate (IMDUR) 30 MG 24 hr tablet Take 1 tablet (30 mg total) by mouth daily. 30 tablet 11  . nitroGLYCERIN (NITROSTAT) 0.4 MG SL tablet Place 1 tablet (0.4 mg total) under the tongue every 5 (five) minutes as needed for chest pain. 25 tablet 3  . omeprazole (PRILOSEC) 20 MG capsule Take 20 mg by mouth daily.     No current facility-administered medications for this visit.    Physical Exam: Filed Vitals:   06/17/15 0929  BP: 126/76  Pulse: 64  Height: 6' (1.829 m)  Weight: 221 lb 12.8 oz (100.608 kg)  SpO2: 97%    GEN- The patient is well appearing, alert and oriented x 3 today.   Head- normocephalic, atraumatic Eyes-  Sclera clear, conjunctiva pink Ears- hearing intact Oropharynx- clear Lungs- Clear to ausculation bilaterally, normal work of breathing Chest- ICD pocket is well healed Heart- irregular rate and rhythm, no murmurs, rubs or gallops, PMI not laterally displaced GI- soft, NT, ND, + BS Extremities- no clubbing, cyanosis, or edema  ICD interrogation- reviewed in detail today,  See PACEART report  Assessment and Plan:  1.  S/p VF arrest/ischemic CM/ chronic systolic dysfunction euvolemic today Stable on an appropriate medical regimen Normal ICD function See Pace Art report No changes today Follow for worsening CHF with RV pacing.  He would like to avoid device upgrade at this time.  If he decompensates, would have low threshold to upgrade to CRT. Will enrill in ICM device clinic with Randon Goldsmith  2. Atrial tachycardia Maintaining sinus rhythm post recent cardioversion.  No arrhythmias in a year.  No afib previuosly Stop eliquis and monitor for afib.  IF he has afib, will need to resume  anticoagulation Return to asa  daily Given moderate LA enlargment on recent echo (reviewed today), I would not be surprised if atrial arrhythmias do not return  3. HTN Stable No change required today  Monthly coreview checks/ Merlin I will see again in a year Follow-up with Dr Antoine Poche as scheduled  Hillis Range MD, [facility] 06/17/2015 10:23 AM

## 2015-06-17 NOTE — Patient Instructions (Addendum)
Your physician has recommended you make the following change in your medication:  Stop eliquis. Start aspirin 81 mg daily. Remote Device check on 09/19/15. Your physician recommends that you schedule a follow-up appointment in: 1 year with Dr. Johney Frame. You will receive a reminder letter in the mail in about 10 months reminding you to call and schedule your appointment. If you don't receive this letter, please contact our office. You are being enrolled in the [facility] clinic. You will be contacted by Randon Goldsmith.

## 2015-06-24 ENCOUNTER — Encounter: Payer: Managed Care, Other (non HMO) | Admitting: Internal Medicine

## 2015-07-01 ENCOUNTER — Encounter: Payer: Self-pay | Admitting: Internal Medicine

## 2015-07-12 ENCOUNTER — Emergency Department (HOSPITAL_COMMUNITY): Payer: Managed Care, Other (non HMO)

## 2015-07-12 ENCOUNTER — Inpatient Hospital Stay (HOSPITAL_COMMUNITY)
Admission: EM | Admit: 2015-07-12 | Discharge: 2015-07-14 | DRG: 082 | Disposition: A | Discharge status: Home / Self Care | Payer: Managed Care, Other (non HMO) | Attending: Internal Medicine | Admitting: Internal Medicine

## 2015-07-12 ENCOUNTER — Encounter (HOSPITAL_COMMUNITY): Payer: Self-pay

## 2015-07-12 DIAGNOSIS — S06341A Traumatic hemorrhage of right cerebrum with loss of consciousness of 30 minutes or less, initial encounter: Secondary | ICD-10-CM

## 2015-07-12 DIAGNOSIS — I2510 Atherosclerotic heart disease of native coronary artery without angina pectoris: Secondary | ICD-10-CM | POA: Diagnosis present

## 2015-07-12 DIAGNOSIS — E7800 Pure hypercholesterolemia, unspecified: Secondary | ICD-10-CM | POA: Diagnosis present

## 2015-07-12 DIAGNOSIS — Z952 Presence of prosthetic heart valve: Secondary | ICD-10-CM

## 2015-07-12 DIAGNOSIS — I499 Cardiac arrhythmia, unspecified: Secondary | ICD-10-CM

## 2015-07-12 DIAGNOSIS — N183 Chronic kidney disease, stage 3 (moderate): Secondary | ICD-10-CM | POA: Diagnosis present

## 2015-07-12 DIAGNOSIS — I130 Hypertensive heart and chronic kidney disease with heart failure and stage 1 through stage 4 chronic kidney disease, or unspecified chronic kidney disease: Secondary | ICD-10-CM | POA: Diagnosis present

## 2015-07-12 DIAGNOSIS — I6200 Nontraumatic subdural hemorrhage, unspecified: Secondary | ICD-10-CM | POA: Diagnosis not present

## 2015-07-12 DIAGNOSIS — Z8673 Personal history of transient ischemic attack (TIA), and cerebral infarction without residual deficits: Secondary | ICD-10-CM | POA: Diagnosis not present

## 2015-07-12 DIAGNOSIS — I255 Ischemic cardiomyopathy: Secondary | ICD-10-CM | POA: Diagnosis present

## 2015-07-12 DIAGNOSIS — Z4502 Encounter for adjustment and management of automatic implantable cardiac defibrillator: Secondary | ICD-10-CM

## 2015-07-12 DIAGNOSIS — I469 Cardiac arrest, cause unspecified: Secondary | ICD-10-CM | POA: Diagnosis not present

## 2015-07-12 DIAGNOSIS — I5022 Chronic systolic (congestive) heart failure: Secondary | ICD-10-CM | POA: Diagnosis present

## 2015-07-12 DIAGNOSIS — S0281XA Fracture of other specified skull and facial bones, right side, initial encounter for closed fracture: Secondary | ICD-10-CM | POA: Diagnosis present

## 2015-07-12 DIAGNOSIS — D649 Anemia, unspecified: Secondary | ICD-10-CM | POA: Diagnosis present

## 2015-07-12 DIAGNOSIS — Z87891 Personal history of nicotine dependence: Secondary | ICD-10-CM | POA: Diagnosis not present

## 2015-07-12 DIAGNOSIS — Z953 Presence of xenogenic heart valve: Secondary | ICD-10-CM

## 2015-07-12 DIAGNOSIS — E785 Hyperlipidemia, unspecified: Secondary | ICD-10-CM | POA: Diagnosis present

## 2015-07-12 DIAGNOSIS — S065X9A Traumatic subdural hemorrhage with loss of consciousness of unspecified duration, initial encounter: Secondary | ICD-10-CM | POA: Diagnosis present

## 2015-07-12 DIAGNOSIS — Z95810 Presence of automatic (implantable) cardiac defibrillator: Secondary | ICD-10-CM

## 2015-07-12 DIAGNOSIS — Z951 Presence of aortocoronary bypass graft: Secondary | ICD-10-CM | POA: Diagnosis not present

## 2015-07-12 DIAGNOSIS — I10 Essential (primary) hypertension: Secondary | ICD-10-CM | POA: Diagnosis not present

## 2015-07-12 DIAGNOSIS — I609 Nontraumatic subarachnoid hemorrhage, unspecified: Secondary | ICD-10-CM

## 2015-07-12 DIAGNOSIS — I4901 Ventricular fibrillation: Secondary | ICD-10-CM | POA: Diagnosis present

## 2015-07-12 DIAGNOSIS — K219 Gastro-esophageal reflux disease without esophagitis: Secondary | ICD-10-CM | POA: Diagnosis present

## 2015-07-12 DIAGNOSIS — Z7982 Long term (current) use of aspirin: Secondary | ICD-10-CM | POA: Diagnosis not present

## 2015-07-12 DIAGNOSIS — I629 Nontraumatic intracranial hemorrhage, unspecified: Secondary | ICD-10-CM | POA: Diagnosis not present

## 2015-07-12 DIAGNOSIS — S06311A Contusion and laceration of right cerebrum with loss of consciousness of 30 minutes or less, initial encounter: Secondary | ICD-10-CM

## 2015-07-12 DIAGNOSIS — S065XAA Traumatic subdural hemorrhage with loss of consciousness status unknown, initial encounter: Secondary | ICD-10-CM

## 2015-07-12 DIAGNOSIS — I63432 Cerebral infarction due to embolism of left posterior cerebral artery: Secondary | ICD-10-CM | POA: Diagnosis present

## 2015-07-12 DIAGNOSIS — I472 Ventricular tachycardia, unspecified: Secondary | ICD-10-CM

## 2015-07-12 DIAGNOSIS — W19XXXA Unspecified fall, initial encounter: Secondary | ICD-10-CM | POA: Diagnosis present

## 2015-07-12 DIAGNOSIS — S020XXA Fracture of vault of skull, initial encounter for closed fracture: Secondary | ICD-10-CM | POA: Diagnosis present

## 2015-07-12 DIAGNOSIS — I619 Nontraumatic intracerebral hemorrhage, unspecified: Secondary | ICD-10-CM

## 2015-07-12 LAB — CBC WITH DIFFERENTIAL/PLATELET
Basophils Absolute: 0 10*3/uL (ref 0.0–0.1)
Basophils Relative: 0 %
EOS ABS: 0.1 10*3/uL (ref 0.0–0.7)
EOS PCT: 1 %
HCT: 35.2 % — ABNORMAL LOW (ref 39.0–52.0)
Hemoglobin: 11.5 g/dL — ABNORMAL LOW (ref 13.0–17.0)
Lymphocytes Relative: 17 %
Lymphs Abs: 1.7 10*3/uL (ref 0.7–4.0)
MCH: 28.7 pg (ref 26.0–34.0)
MCHC: 32.7 g/dL (ref 30.0–36.0)
MCV: 87.8 fL (ref 78.0–100.0)
MONO ABS: 1 10*3/uL (ref 0.1–1.0)
MONOS PCT: 10 %
NEUTROS PCT: 72 %
Neutro Abs: 7.1 10*3/uL (ref 1.7–7.7)
PLATELETS: 160 10*3/uL (ref 150–400)
RBC: 4.01 MIL/uL — ABNORMAL LOW (ref 4.22–5.81)
RDW: 14.4 % (ref 11.5–15.5)
WBC: 9.8 10*3/uL (ref 4.0–10.5)

## 2015-07-12 LAB — I-STAT TROPONIN, ED: Troponin i, poc: 0.02 ng/mL (ref 0.00–0.08)

## 2015-07-12 LAB — CBG MONITORING, ED: GLUCOSE-CAPILLARY: 125 mg/dL — AB (ref 65–99)

## 2015-07-12 LAB — COMPREHENSIVE METABOLIC PANEL
ALK PHOS: 95 U/L (ref 38–126)
ALT: 27 U/L (ref 17–63)
AST: 34 U/L (ref 15–41)
Albumin: 3.4 g/dL — ABNORMAL LOW (ref 3.5–5.0)
Anion gap: 6 (ref 5–15)
BUN: 17 mg/dL (ref 6–20)
CHLORIDE: 106 mmol/L (ref 101–111)
CO2: 28 mmol/L (ref 22–32)
CREATININE: 1.53 mg/dL — AB (ref 0.61–1.24)
Calcium: 8.2 mg/dL — ABNORMAL LOW (ref 8.9–10.3)
GFR, EST AFRICAN AMERICAN: 51 mL/min — AB (ref >60)
GFR, EST NON AFRICAN AMERICAN: 44 mL/min — AB (ref >60)
Glucose, Bld: 133 mg/dL — ABNORMAL HIGH (ref 65–99)
Potassium: 3.5 mmol/L (ref 3.5–5.1)
Sodium: 140 mmol/L (ref 135–145)
TOTAL PROTEIN: 6 g/dL — AB (ref 6.5–8.1)
Total Bilirubin: 1.2 mg/dL (ref 0.3–1.2)

## 2015-07-12 LAB — URINALYSIS, ROUTINE W REFLEX MICROSCOPIC
Bilirubin Urine: NEGATIVE
Glucose, UA: NEGATIVE mg/dL
Hgb urine dipstick: NEGATIVE
KETONES UR: NEGATIVE mg/dL
NITRITE: NEGATIVE
PH: 5 (ref 5.0–8.0)
PROTEIN: NEGATIVE mg/dL
Specific Gravity, Urine: 1.018 (ref 1.005–1.030)
Urobilinogen, UA: 1 mg/dL (ref 0.0–1.0)

## 2015-07-12 LAB — URINE MICROSCOPIC-ADD ON

## 2015-07-12 LAB — PHOSPHORUS: Phosphorus: 3 mg/dL (ref 2.5–4.6)

## 2015-07-12 LAB — MAGNESIUM: MAGNESIUM: 1.8 mg/dL (ref 1.7–2.4)

## 2015-07-12 MED ORDER — BISOPROLOL FUMARATE 10 MG PO TABS
10.0000 mg | ORAL_TABLET | Freq: Every day | ORAL | Status: DC
  Administered 2015-07-12 – 2015-07-14 (×3): 10 mg via ORAL
  Filled 2015-07-12 (×3): qty 1

## 2015-07-12 MED ORDER — HYDRALAZINE HCL 20 MG/ML IJ SOLN: 10.0000 mg | INTRAMUSCULAR | Status: DC | PRN

## 2015-07-12 MED ORDER — ACETAMINOPHEN 325 MG PO TABS
650.0000 mg | ORAL_TABLET | Freq: Four times a day (QID) | ORAL | Status: DC | PRN
  Administered 2015-07-13: 650 mg via ORAL
  Filled 2015-07-12: qty 2

## 2015-07-12 MED ORDER — NITROGLYCERIN 0.4 MG SL SUBL: 0.4000 mg | SUBLINGUAL_TABLET | SUBLINGUAL | Status: DC | PRN

## 2015-07-12 MED ORDER — SODIUM CHLORIDE 0.9 % IV SOLN: INTRAVENOUS | Status: DC

## 2015-07-12 MED ORDER — ACETAMINOPHEN 325 MG PO TABS
650.0000 mg | ORAL_TABLET | Freq: Once | ORAL | Status: AC
  Administered 2015-07-12: 650 mg via ORAL
  Filled 2015-07-12: qty 2

## 2015-07-12 MED ORDER — FUROSEMIDE 40 MG PO TABS
40.0000 mg | ORAL_TABLET | Freq: Every day | ORAL | Status: DC
  Administered 2015-07-13 – 2015-07-14 (×2): 40 mg via ORAL
  Filled 2015-07-12 (×2): qty 1

## 2015-07-12 MED ORDER — HYDRALAZINE HCL 25 MG PO TABS
25.0000 mg | ORAL_TABLET | Freq: Three times a day (TID) | ORAL | Status: DC
  Administered 2015-07-12 – 2015-07-14 (×3): 25 mg via ORAL
  Filled 2015-07-12 (×5): qty 1

## 2015-07-12 MED ORDER — MORPHINE SULFATE (PF) 4 MG/ML IV SOLN: 4.0000 mg | Freq: Once | INTRAVENOUS | Status: DC

## 2015-07-12 MED ORDER — ASPIRIN EC 81 MG PO TBEC: 81.0000 mg | DELAYED_RELEASE_TABLET | Freq: Every day | ORAL | Status: DC

## 2015-07-12 MED ORDER — ACETAMINOPHEN 650 MG RE SUPP: 650.0000 mg | Freq: Four times a day (QID) | RECTAL | Status: DC | PRN

## 2015-07-12 MED ORDER — ONDANSETRON HCL 4 MG PO TABS: 4.0000 mg | ORAL_TABLET | Freq: Four times a day (QID) | ORAL | Status: DC | PRN

## 2015-07-12 MED ORDER — ONDANSETRON HCL 4 MG/2ML IJ SOLN: 4.0000 mg | Freq: Four times a day (QID) | INTRAMUSCULAR | Status: DC | PRN

## 2015-07-12 MED ORDER — POTASSIUM CHLORIDE CRYS ER 20 MEQ PO TBCR
40.0000 meq | EXTENDED_RELEASE_TABLET | Freq: Once | ORAL | Status: AC
  Administered 2015-07-12: 40 meq via ORAL
  Filled 2015-07-12: qty 2

## 2015-07-12 MED ORDER — SODIUM CHLORIDE 0.9 % IJ SOLN
3.0000 mL | Freq: Two times a day (BID) | INTRAMUSCULAR | Status: DC
  Administered 2015-07-13 – 2015-07-14 (×4): 3 mL via INTRAVENOUS

## 2015-07-12 MED ORDER — MORPHINE SULFATE (PF) 4 MG/ML IV SOLN
4.0000 mg | Freq: Once | INTRAVENOUS | Status: AC
  Administered 2015-07-12: 4 mg via INTRAVENOUS
  Filled 2015-07-12: qty 1

## 2015-07-12 MED ORDER — LEVETIRACETAM 500 MG PO TABS
500.0000 mg | ORAL_TABLET | Freq: Two times a day (BID) | ORAL | Status: DC
  Administered 2015-07-12 – 2015-07-14 (×4): 500 mg via ORAL
  Filled 2015-07-12 (×4): qty 1

## 2015-07-12 MED ORDER — ISOSORBIDE MONONITRATE ER 30 MG PO TB24
30.0000 mg | ORAL_TABLET | Freq: Every day | ORAL | Status: DC
  Administered 2015-07-13 – 2015-07-14 (×2): 30 mg via ORAL
  Filled 2015-07-12 (×2): qty 1

## 2015-07-12 MED ORDER — ATORVASTATIN CALCIUM 80 MG PO TABS
80.0000 mg | ORAL_TABLET | Freq: Every day | ORAL | Status: DC
  Administered 2015-07-13: 80 mg via ORAL
  Filled 2015-07-12: qty 1

## 2015-07-12 NOTE — Consult Note (Addendum)
Reason for Consult:   VT, syncope  Requesting Physician: ED Primary Cardiologist Dr Hochrein/ Dr [name]  HPI:   69 y/o male with a history of previous MI with cord rupture. He had CABG and tissue MVR May 2014. His EF is depressed- last echo 3/16-EF 20%. He has had recurrent VT and atrial arrhythmias. He had a significant VF arrest with respiratory failure and toxic encephalopathy in feb 2015. He had a [employer] ICD placed in March 2015. In May 2015 he had a TEE DCCV to NSR. In June 2015 he had another VT event treated successfully with his ICD. He had been on Eliquis till Oct 2016 when this was stopped by Dr [name]. He is currently on ASA 81 mg.             He works at [employer] and this pm he had sudden syncope and suffered a small SDH and skull fracture. His ICD reportedly shocked appropriately. He denies any recent medication changes, chest pain, fever, palpitations, SOB, or other illness but admitted he had been out of his Bisoprolol for the past few days.   PMHx:  Past Medical History  Diagnosis Date  . High cholesterol   . Hypertension   . Constipation   . Reflux   . Complication of anesthesia     "Hard time waking me up" after sedation after dental procedure  . CAD (coronary artery disease)     LAD 95% proximal stenosis, D1 50-60% stenosis, the circumflex 40% stenosis, RCA subtotal stenosis.  . Cardiomyopathy, ischemic     EF was 30-35% by echo but 45-50% by cath.  Most recent EF 15%.    . Mitral regurgitation     Secondary to papillary muscle rupture  . H/O cardiac arrest   . Atrial tachycardia (HCC)   . Ventricular fibrillation (HCC) 02/16/14    successfully defibrillation by his ICD    Past Surgical History  Procedure Laterality Date  . Stomach ulcer repair    . Tee without cardioversion N/A 02/04/2013    Procedure: TRANSESOPHAGEAL ECHOCARDIOGRAM (TEE);  Surgeon: Vesta MixerPhilip J Nahser, MD;  [facility];  Service: Cardiovascular;  Laterality: N/A;    . Intraoperative transesophageal echocardiogram N/A 02/05/2013    Procedure: INTRAOPERATIVE TRANSESOPHAGEAL ECHOCARDIOGRAM;  Surgeon: Loreli SlotSteven C Hendrickson, MD;  [facility];  Service: Open Heart Surgery;  Laterality: N/A;  . Endovein harvest of greater saphenous vein Right 02/05/2013    Procedure: ENDOVEIN HARVEST OF GREATER SAPHENOUS VEIN;  Surgeon: Loreli SlotSteven C Hendrickson, MD;  [facility];  Service: Open Heart Surgery;  Laterality: Right;  . Patent foramen ovale closure N/A 02/05/2013    Procedure: PATENT FORAMEN OVALE CLOSURE;  Surgeon: Loreli SlotSteven C Hendrickson, MD;  [facility];  Service: Open Heart Surgery;  Laterality: N/A;  . Mitral valve replacement (mvr)/coronary artery bypass grafting (cabg) N/A 02/05/2013    Procedure: MITRAL VALVE REPLACEMENT (MVR)/CORONARY ARTERY BYPASS GRAFTING (CABG);  Surgeon: Loreli SlotSteven C Hendrickson, MD;  [facility];  Service: Open Heart Surgery;  Laterality: N/A;  x3 using right greater saphenous vein and left internal mammary.   . Coronary artery bypass graft    . Implantable cardioverter defibrillator implant  12/03/2013    STJ Fortify ICD implanted for secondary prevention by Dr [name]  . Cardioversion N/A 01/25/2014    Procedure: CARDIOVERSION;  Surgeon: Wendall StadePeter C Nishan, MD;  [facility];  Service: Cardiovascular;  Laterality: N/A;  . Left heart catheterization with  coronary angiogram N/A 02/03/2013    Procedure: LEFT HEART CATHETERIZATION WITH CORONARY ANGIOGRAM;  Surgeon: Iran Ouch, MD;  [facility]: [facility] C[facility] LAB;  Service: Cardiovascular;  Laterality: N/A;  . Left heart catheterization with coronary/graft angiogram N/A 11/01/2013    Procedure: LEFT HEART CATHETERIZATION WITH Isabel Caprice;  Surgeon: Peter M Swaziland, MD;  Location: [facility] CATH LAB;  Service: Cardiovascular;  Laterality: N/A;  . Implantable cardioverter defibrillator implant N/A 12/03/2013    Procedure: IMPLANTABLE CARDIOVERTER DEFIBRILLATOR IMPLANT;  Surgeon: Gardiner Rhyme, MD;  Location: [facility] CATH LAB;  Service: Cardiovascular;  Laterality: N/A;    SOCHx:  reports that he quit smoking about 41 years ago. His smoking use included Cigarettes. He does not have any smokeless tobacco history on file. He reports that he drinks alcohol. He reports that he does not use illicit drugs.  FAMHx: Family History  Problem Relation Age of Onset  . Heart attack Mother 32    ALLERGIES: No Known Allergies  ROS: Review of Systems: General: negative for chills, fever, night sweats or weight changes.  Cardiovascular: negative for chest pain, dyspnea on exertion, edema, orthopnea, palpitations, paroxysmal nocturnal dyspnea or shortness of breath HEENT: negative for any visual disturbances, blindness, glaucoma Dermatological: negative for rash Respiratory: negative for cough, hemoptysis, or wheezing Urologic: negative for hematuria or dysuria Abdominal: negative for nausea, vomiting, diarrhea, bright red blood per rectum, melena, or hematemesis Neurologic: negative for visual changes, syncope, or dizziness Musculoskeletal: negative for back pain, joint pain, or swelling Psych: cooperative and appropriate All other systems reviewed and are otherwise negative except as noted above.   HOME MEDICATIONS: Prior to Admission medications   Medication Sig Start Date End Date Taking? Authorizing Provider  acetaminophen (TYLENOL) 500 MG tablet Take 500 mg by mouth every 6 (six) hours as needed for mild pain or moderate pain.    Yes Historical Provider, MD  aspirin EC 81 MG tablet Take 1 tablet (81 mg total) by mouth daily. 06/17/15  Yes Hillis Range, MD  atorvastatin (LIPITOR) 80 MG tablet Take 1 tablet (80 mg total) by mouth daily at 6 PM. 12/15/14  Yes Rollene Rotunda, MD  bisoprolol (ZEBETA) 10 MG tablet TAKE ONE TABLET BY MOUTH ONCE DAILY 05/31/15  Yes Rollene Rotunda, MD  furosemide (LASIX) 40 MG tablet Take 1 tablet (40 mg total) by mouth daily. 12/15/14  Yes Rollene Rotunda, MD    hydrALAZINE (APRESOLINE) 25 MG tablet Take 1 tablet (25 mg total) by mouth every 8 (eight) hours. 12/15/14  Yes Rollene Rotunda, MD  isosorbide mononitrate (IMDUR) 30 MG 24 hr tablet Take 1 tablet (30 mg total) by mouth daily. 12/15/14  Yes Rollene Rotunda, MD  nitroGLYCERIN (NITROSTAT) 0.4 MG SL tablet Place 1 tablet (0.4 mg total) under the tongue every 5 (five) minutes as needed for chest pain. 12/04/13  Yes Ok Anis, NP    HOSPITAL MEDICATIONS: I have reviewed the patient's current medications.  VITALS: Blood pressure 140/79, pulse 82, temperature 98.6 F (37 C), temperature source Oral, resp. rate 18, SpO2 99 %.  PHYSICAL EXAM: General appearance: alert, cooperative, no distress and hematoma Rt orbit Neck: no carotid bruit and no JVD Lungs: clear to auscultation bilaterally Heart: regular rate and rhythm and soft systolic murmur, S4 Abdomen: soft, non-tender; bowel sounds normal; no masses,  no organomegaly and mid line surgical scar Extremities: extremities normal, atraumatic, no cyanosis or edema Pulses: 2+ and symmetric Skin: Skin color, texture, turgor normal. No rashes or lesions Neurologic: Grossly normal  LABS: Results for orders placed or performed during the hospital encounter of 07/12/15 (from the past 24 hour(s))  CBC with Differential     Status: Abnormal   Collection Time: 07/12/15  3:32 PM  Result Value Ref Range   WBC 9.8 4.0 - 10.5 K/uL   RBC 4.01 (L) 4.22 - 5.81 MIL/uL   Hemoglobin 11.5 (L) 13.0 - 17.0 g/dL   HCT 16.1 (L) 09.6 - 04.5 %   MCV 87.8 78.0 - 100.0 fL   MCH 28.7 26.0 - 34.0 pg   MCHC 32.7 30.0 - 36.0 g/dL   RDW 40.9 81.1 - 91.4 %   Platelets 160 150 - 400 K/uL   Neutrophils Relative % 72 %   Neutro Abs 7.1 1.7 - 7.7 K/uL   Lymphocytes Relative 17 %   Lymphs Abs 1.7 0.7 - 4.0 K/uL   Monocytes Relative 10 %   Monocytes Absolute 1.0 0.1 - 1.0 K/uL   Eosinophils Relative 1 %   Eosinophils Absolute 0.1 0.0 - 0.7 K/uL   Basophils Relative  0 %   Basophils Absolute 0.0 0.0 - 0.1 K/uL  Magnesium     Status: None   Collection Time: 07/12/15  3:32 PM  Result Value Ref Range   Magnesium 1.8 1.7 - 2.4 mg/dL  Phosphorus     Status: None   Collection Time: 07/12/15  3:32 PM  Result Value Ref Range   Phosphorus 3.0 2.5 - 4.6 mg/dL  Comprehensive metabolic panel     Status: Abnormal   Collection Time: 07/12/15  3:32 PM  Result Value Ref Range   Sodium 140 135 - 145 mmol/L   Potassium 3.5 3.5 - 5.1 mmol/L   Chloride 106 101 - 111 mmol/L   CO2 28 22 - 32 mmol/L   Glucose, Bld 133 (H) 65 - 99 mg/dL   BUN 17 6 - 20 mg/dL   Creatinine, Ser 7.82 (H) 0.61 - 1.24 mg/dL   Calcium 8.2 (L) 8.9 - 10.3 mg/dL   Total Protein 6.0 (L) 6.5 - 8.1 g/dL   Albumin 3.4 (L) 3.5 - 5.0 g/dL   AST 34 15 - 41 U/L   ALT 27 17 - 63 U/L   Alkaline Phosphatase 95 38 - 126 U/L   Total Bilirubin 1.2 0.3 - 1.2 mg/dL   GFR calc non Af Amer 44 (L) >60 mL/min   GFR calc Af Amer 51 (L) >60 mL/min   Anion gap 6 5 - 15  CBG monitoring, ED     Status: Abnormal   Collection Time: 07/12/15  3:35 PM  Result Value Ref Range   Glucose-Capillary 125 (H) 65 - 99 mg/dL  I-Stat Troponin, ED (not at [facility])     Status: None   Collection Time: 07/12/15  3:42 PM  Result Value Ref Range   Troponin i, poc 0.02 0.00 - 0.08 ng/mL   Comment 3          Urinalysis, Routine w reflex microscopic (not at [facility])     Status: Abnormal   Collection Time: 07/12/15  6:05 PM  Result Value Ref Range   Color, Urine YELLOW YELLOW   APPearance CLOUDY (A) CLEAR   Specific Gravity, Urine 1.018 1.005 - 1.030   pH 5.0 5.0 - 8.0   Glucose, UA NEGATIVE NEGATIVE mg/dL   Hgb urine dipstick NEGATIVE NEGATIVE   Bilirubin Urine NEGATIVE NEGATIVE   Ketones, ur NEGATIVE NEGATIVE mg/dL   Protein, ur NEGATIVE NEGATIVE mg/dL   Urobilinogen, UA 1.0  0.0 - 1.0 mg/dL   Nitrite NEGATIVE NEGATIVE   Leukocytes, UA TRACE (A) NEGATIVE  Urine microscopic-add on     Status: Abnormal   Collection Time: 07/12/15   6:05 PM  Result Value Ref Range   WBC, UA 0-2 <3 WBC/hpf   RBC / HPF 0-2 <3 RBC/hpf   Casts GRANULAR CAST (A) NEGATIVE   Urine-Other MUCOUS PRESENT     EKG: Reported as NSR wide QRS with pacing  IMAGING: Dg Chest 2 View  07/12/2015  CLINICAL DATA:  Syncope EXAM: CHEST  2 VIEW COMPARISON:  12/04/2013 FINDINGS: Cardiomegaly with vascular congestion. Interstitial prominence within the lungs could reflect early interstitial edema. Prior CABG. Left pacer is unchanged. No effusions. No acute bony abnormality. IMPRESSION: Cardiomegaly with vascular congestion and possible early interstitial edema. Electronically Signed   By: Charlett Nose M.D.   On: 07/12/2015 16:14   Ct Head Wo Contrast  07/12/2015  CLINICAL DATA:  Syncopal episode.  Fell.  Hit head. EXAM: CT HEAD WITHOUT CONTRAST CT MAXILLOFACIAL WITHOUT CONTRAST CT CERVICAL SPINE WITHOUT CONTRAST TECHNIQUE: Multidetector CT imaging of the head, cervical spine, and maxillofacial structures were performed using the standard protocol without intravenous contrast. Multiplanar CT image reconstructions of the cervical spine and maxillofacial structures were also generated. COMPARISON:  Head CT 11/01/2013 FINDINGS: CT HEAD FINDINGS There is a small subdural hematoma in the right frontoparietal region with mild sulcal effacement. There is also a fair amount of subarachnoid hemorrhage in the right frontal area and in the right sylvian fissure. There is also a subdural hematoma along the right tentorium. Subarachnoid hemorrhage is also noted in the interhemispheric fissure anteriorly. There is a hemorrhagic contusion involving the right frontal lobe in the anterior cranial fossa inferiorly. Remote left occipital infarct with encephalomalacia. The ventricles are in the midline and stable in size and configuration. The brainstem and cerebellum appear normal. There is a nondisplaced but fairly extensive right frontal bone fracture beginning at the vertex and extending  down to just above the right frontal sinus. The globes are intact. CT MAXILLOFACIAL FINDINGS There is a hematoma around the right orbit. There is a nondisplaced fracture of the orbital roof best seen on the sagittal reformatted images. No other definite facial bone fractures. The paranasal sinuses and mastoid air cells are clear. CT CERVICAL SPINE FINDINGS Advanced degenerative cervical spondylosis with multilevel disc disease and facet disease. The most significant disc disease is at C5-6 an the facet disease is fairly diffuse and more significant. There are advanced degenerative changes at C1-2. The dens is intact. The cervical vertebral bodies are overall normally aligned. There is mild degenerative anterior subluxation of C7 compared to T1 and the facets are fused bilaterally at this level. The spinal canal is generous. No spinal stenosis. The sternoclavicular joints demonstrate moderate arthropathy, vertically on the right side. The lung apices are grossly clear. IMPRESSION: 1. Small right-sided subdural hematoma and along the right tentorium. There is also a fair amount of subarachnoid hemorrhage and a hemorrhagic contusion involving the right frontal lobe. The pattern of subarachnoid hemorrhage is most likely posttraumatic and less likely due to a ruptured aneurysm. CT angiography of the brain may be helpful for further evaluation. 2. Nondisplaced right frontal skull fracture. 3. Nondisplaced orbital roof fracture. 4. Advanced degenerative cervical spondylosis but no acute cervical spine fracture. 5. Remote left sided occipital infarct with encephalomalacia. These results were called by telephone at the time of interpretation on 07/12/2015 at 5:07 pm to Dr. Crista Curb ,  who verbally acknowledged these results. Electronically Signed   By: Rudie Meyer M.D.   On: 07/12/2015 17:07   Ct Cervical Spine Wo Contrast  07/12/2015  CLINICAL DATA:  Syncopal episode.  Fell.  Hit head. EXAM: CT HEAD WITHOUT CONTRAST CT  MAXILLOFACIAL WITHOUT CONTRAST CT CERVICAL SPINE WITHOUT CONTRAST TECHNIQUE: Multidetector CT imaging of the head, cervical spine, and maxillofacial structures were performed using the standard protocol without intravenous contrast. Multiplanar CT image reconstructions of the cervical spine and maxillofacial structures were also generated. COMPARISON:  Head CT 11/01/2013 FINDINGS: CT HEAD FINDINGS There is a small subdural hematoma in the right frontoparietal region with mild sulcal effacement. There is also a fair amount of subarachnoid hemorrhage in the right frontal area and in the right sylvian fissure. There is also a subdural hematoma along the right tentorium. Subarachnoid hemorrhage is also noted in the interhemispheric fissure anteriorly. There is a hemorrhagic contusion involving the right frontal lobe in the anterior cranial fossa inferiorly. Remote left occipital infarct with encephalomalacia. The ventricles are in the midline and stable in size and configuration. The brainstem and cerebellum appear normal. There is a nondisplaced but fairly extensive right frontal bone fracture beginning at the vertex and extending down to just above the right frontal sinus. The globes are intact. CT MAXILLOFACIAL FINDINGS There is a hematoma around the right orbit. There is a nondisplaced fracture of the orbital roof best seen on the sagittal reformatted images. No other definite facial bone fractures. The paranasal sinuses and mastoid air cells are clear. CT CERVICAL SPINE FINDINGS Advanced degenerative cervical spondylosis with multilevel disc disease and facet disease. The most significant disc disease is at C5-6 an the facet disease is fairly diffuse and more significant. There are advanced degenerative changes at C1-2. The dens is intact. The cervical vertebral bodies are overall normally aligned. There is mild degenerative anterior subluxation of C7 compared to T1 and the facets are fused bilaterally at this  level. The spinal canal is generous. No spinal stenosis. The sternoclavicular joints demonstrate moderate arthropathy, vertically on the right side. The lung apices are grossly clear. IMPRESSION: 1. Small right-sided subdural hematoma and along the right tentorium. There is also a fair amount of subarachnoid hemorrhage and a hemorrhagic contusion involving the right frontal lobe. The pattern of subarachnoid hemorrhage is most likely posttraumatic and less likely due to a ruptured aneurysm. CT angiography of the brain may be helpful for further evaluation. 2. Nondisplaced right frontal skull fracture. 3. Nondisplaced orbital roof fracture. 4. Advanced degenerative cervical spondylosis but no acute cervical spine fracture. 5. Remote left sided occipital infarct with encephalomalacia. These results were called by telephone at the time of interpretation on 07/12/2015 at 5:07 pm to Dr. Crista Curb , who verbally acknowledged these results. Electronically Signed   By: Rudie Meyer M.D.   On: 07/12/2015 17:07   Ct Maxillofacial Wo Cm  07/12/2015  CLINICAL DATA:  Syncopal episode.  Fell.  Hit head. EXAM: CT HEAD WITHOUT CONTRAST CT MAXILLOFACIAL WITHOUT CONTRAST CT CERVICAL SPINE WITHOUT CONTRAST TECHNIQUE: Multidetector CT imaging of the head, cervical spine, and maxillofacial structures were performed using the standard protocol without intravenous contrast. Multiplanar CT image reconstructions of the cervical spine and maxillofacial structures were also generated. COMPARISON:  Head CT 11/01/2013 FINDINGS: CT HEAD FINDINGS There is a small subdural hematoma in the right frontoparietal region with mild sulcal effacement. There is also a fair amount of subarachnoid hemorrhage in the right frontal area and in the right sylvian  fissure. There is also a subdural hematoma along the right tentorium. Subarachnoid hemorrhage is also noted in the interhemispheric fissure anteriorly. There is a hemorrhagic contusion involving the  right frontal lobe in the anterior cranial fossa inferiorly. Remote left occipital infarct with encephalomalacia. The ventricles are in the midline and stable in size and configuration. The brainstem and cerebellum appear normal. There is a nondisplaced but fairly extensive right frontal bone fracture beginning at the vertex and extending down to just above the right frontal sinus. The globes are intact. CT MAXILLOFACIAL FINDINGS There is a hematoma around the right orbit. There is a nondisplaced fracture of the orbital roof best seen on the sagittal reformatted images. No other definite facial bone fractures. The paranasal sinuses and mastoid air cells are clear. CT CERVICAL SPINE FINDINGS Advanced degenerative cervical spondylosis with multilevel disc disease and facet disease. The most significant disc disease is at C5-6 an the facet disease is fairly diffuse and more significant. There are advanced degenerative changes at C1-2. The dens is intact. The cervical vertebral bodies are overall normally aligned. There is mild degenerative anterior subluxation of C7 compared to T1 and the facets are fused bilaterally at this level. The spinal canal is generous. No spinal stenosis. The sternoclavicular joints demonstrate moderate arthropathy, vertically on the right side. The lung apices are grossly clear. IMPRESSION: 1. Small right-sided subdural hematoma and along the right tentorium. There is also a fair amount of subarachnoid hemorrhage and a hemorrhagic contusion involving the right frontal lobe. The pattern of subarachnoid hemorrhage is most likely posttraumatic and less likely due to a ruptured aneurysm. CT angiography of the brain may be helpful for further evaluation. 2. Nondisplaced right frontal skull fracture. 3. Nondisplaced orbital roof fracture. 4. Advanced degenerative cervical spondylosis but no acute cervical spine fracture. 5. Remote left sided occipital infarct with encephalomalacia. These results  were called by telephone at the time of interpretation on 07/12/2015 at 5:07 pm to Dr. Crista Curb , who verbally acknowledged these results. Electronically Signed   By: Rudie Meyer M.D.   On: 07/12/2015 17:07    IMPRESSION: Principal Problem:   Ventricular tachycardia- recurrent Active Problems:   Cardiac arrest- out of hospital cardiac arrest,   SDH (subdural hematoma) (HCC)   CAD - CABG x 3 5/14- patent grafts 11/01/13   Cardiomyopathy, ischemic- EF down to 15% by echo 11/02/13   Chronic systolic heart failure (HCC)   Essential hypertension, benign   High cholesterol   History of MVR (tissue valve) 5/14   H/O Embolic stroke involving left posterior cerebral artery (HCC)   RECOMMENDATION: Keep K+ closer to 4.0, check Mg and TSH. Resume beta blocker. Discuss with EP tomorrow.  Time Spent Directly with Patient: 40 minutes  Abelino Derrick [phone] beeper 07/12/2015, 7:32 PM   Patient seen and discussed with PA Diona Fanti, I agree with is documenation. 69 yo male hx of ICM with LVEF 20-25%, prior CABG, bioprosthetic MVR, atach s/p DCCV, hx of VF arrest with [employer] ICD for secondary prevention admitted with syncope. History of cardiac arrest 10/2013 with VT/VF, ICD placed for secondary prevention. Recurrent VF with syncope and ICD shock 02/2015. Episode of syncope while at work. He does not remember the details, unsure of any prodrome. It appears he has not been taking one of his medications for the last 2 days, family believe its his bisoprolol.   Interrogation reviewed. Patient with VF in 300 range, appropriate defib 30J x1 with succesful conversion.   Na 140  K 3.5 Cr 1.53 BUN 17 Mg 1.8 Hgb 11.5 Plt 160 Trop neg CXR cardiomegaly with vacular congestion CT head small right subdural hematoma, right frontal lobe subarachonid hemorrhage.  11/2014 echo: LVEF 20-25%, grade I diastolic dysfunction EKG A-sensed, V-paced  Subdural hemtaoma and subarachnoid hemorrhage per neurosurg and primary  team. From cardiac standpoint likely recurrent VT secondary to his chronic systolic HF/ICM, potentially related to missing his bisoprolol the last few days. Acute ischemia is less likely. Can cycle enzymes overnight.  Continue CHF meds, has not been on ACE/ARB per notes due to renal insufficiency. Overall titration has been limited by soft bp's. Please keep K at 4 and Mg at 2. Ok to hold ASA as needed for head bleed.    Dominga Ferry  MD

## 2015-07-12 NOTE — ED Notes (Signed)
Attempted to call report

## 2015-07-12 NOTE — H&P (Signed)
[facility] History and Physical  Lonnie [name] ZOX:[id number] DOB: 11-05-1945 DOA: 07/12/2015  Referring physician: Dr.Liu. PCP: Cassell Smiles., MD  Specialists: Dr.Allred. Cardiologist.  Chief Complaint: Loss of consciousness.  HPI: Lonnie [name] is a 69 y.o. male history of CAD status post CABG, bioprosthetic mitral valve, ischemic cardiomyopathy status post ICD placement, atrial arrhythmia who was recently discontinued off Apixaban had a syncopal episode at his workplace today morning. Patient states he suddenly lost consciousness while working. Denies any palpitations chest pain shortness of breath prior or after the episode. He does not recall the incident exactly. He was brought to the ER and on exam patient has right periorbital hematoma and CT head shows intracranial subdural and subarachnoid hemorrhage. Defibrillator was interrogated and was found to have ventricular tachycardia episode. On-call cardiologist and neurosurgeon has been consulted. Patient is admitted for further observation. Patient on my exam still has mild headache but otherwise nonfocal. Denies any chest pain or shortness of breath. Patient may have missed some of his medications last few days.   Review of Systems: As presented in the history of presenting illness, rest negative.  Past Medical History  Diagnosis Date  . High cholesterol   . Hypertension   . Constipation   . Reflux   . Complication of anesthesia     "Hard time waking me up" after sedation after dental procedure  . CAD (coronary artery disease)     LAD 95% proximal stenosis, D1 50-60% stenosis, the circumflex 40% stenosis, RCA subtotal stenosis.  . Cardiomyopathy, ischemic     EF was 30-35% by echo but 45-50% by cath.  Most recent EF 15%.    . Mitral regurgitation     Secondary to papillary muscle rupture  . H/O cardiac arrest   . Atrial tachycardia (HCC)   . Ventricular fibrillation (HCC) 02/16/14    successfully defibrillation by  his ICD   Past Surgical History  Procedure Laterality Date  . Stomach ulcer repair    . Tee without cardioversion N/A 02/04/2013    Procedure: TRANSESOPHAGEAL ECHOCARDIOGRAM (TEE);  Surgeon: Vesta Mixer, MD;  Location: [facility] ENDOSCOPY;  Service: Cardiovascular;  Laterality: N/A;  . Intraoperative transesophageal echocardiogram N/A 02/05/2013    Procedure: INTRAOPERATIVE TRANSESOPHAGEAL ECHOCARDIOGRAM;  Surgeon: [name], MD;  Location: [facility] OR;  Service: Open Heart Surgery;  Laterality: N/A;  . Endovein harvest of greater saphenous vein Right 02/05/2013    Procedure: ENDOVEIN HARVEST OF GREATER SAPHENOUS VEIN;  Surgeon: [name], MD;  Location: [facility] OR;  Service: Open Heart Surgery;  Laterality: Right;  . Patent foramen ovale closure N/A 02/05/2013    Procedure: PATENT FORAMEN OVALE CLOSURE;  Surgeon: [name], MD;  Location: [facility] OR;  Service: Open Heart Surgery;  Laterality: N/A;  . Mitral valve replacement (mvr)/coronary artery bypass grafting (cabg) N/A 02/05/2013    Procedure: MITRAL VALVE REPLACEMENT (MVR)/CORONARY ARTERY BYPASS GRAFTING (CABG);  Surgeon: [name], MD;  Location: [facility] OR;  Service: Open Heart Surgery;  Laterality: N/A;  x3 using right greater saphenous vein and left internal mammary.   . Coronary artery bypass graft    . Implantable cardioverter defibrillator implant  12/03/2013    STJ Fortify ICD implanted for secondary prevention by Dr Johney Frame  . Cardioversion N/A 01/25/2014    Procedure: CARDIOVERSION;  Surgeon: Wendall Stade, MD;  Location: [facility] ENDOSCOPY;  Service: Cardiovascular;  Laterality: N/A;  . Left heart catheterization with coronary angiogram N/A 02/03/2013    Procedure: LEFT HEART  CATHETERIZATION WITH CORONARY ANGIOGRAM;  Surgeon: Iran Ouch, MD;  Location: [facility] CATH LAB;  Service: Cardiovascular;  Laterality: N/A;  . Left heart catheterization with coronary/graft angiogram N/A 11/01/2013    Procedure: LEFT HEART  CATHETERIZATION WITH Isabel Caprice;  Surgeon: Peter M Swaziland, MD;  Location: [facility] CATH LAB;  Service: Cardiovascular;  Laterality: N/A;  . Implantable cardioverter defibrillator implant N/A 12/03/2013    Procedure: IMPLANTABLE CARDIOVERTER DEFIBRILLATOR IMPLANT;  Surgeon: Gardiner Rhyme, MD;  Location: [facility] CATH LAB;  Service: Cardiovascular;  Laterality: N/A;   Social History:  reports that he quit smoking about 41 years ago. His smoking use included Cigarettes. He does not have any smokeless tobacco history on file. He reports that he drinks alcohol. He reports that he does not use illicit drugs. Where does patient live home. Can patient participate in ADLs? Yes.  No Known Allergies  Family History:  Family History  Problem Relation Age of Onset  . Heart attack Mother 59      Prior to Admission medications   Medication Sig Start Date End Date Taking? Authorizing Provider  acetaminophen (TYLENOL) 500 MG tablet Take 500 mg by mouth every 6 (six) hours as needed for mild pain or moderate pain.    Yes Historical Provider, MD  aspirin EC 81 MG tablet Take 1 tablet (81 mg total) by mouth daily. 06/17/15  Yes Hillis Range, MD  atorvastatin (LIPITOR) 80 MG tablet Take 1 tablet (80 mg total) by mouth daily at 6 PM. 12/15/14  Yes Rollene Rotunda, MD  bisoprolol (ZEBETA) 10 MG tablet TAKE ONE TABLET BY MOUTH ONCE DAILY 05/31/15  Yes Rollene Rotunda, MD  furosemide (LASIX) 40 MG tablet Take 1 tablet (40 mg total) by mouth daily. 12/15/14  Yes Rollene Rotunda, MD  hydrALAZINE (APRESOLINE) 25 MG tablet Take 1 tablet (25 mg total) by mouth every 8 (eight) hours. 12/15/14  Yes Rollene Rotunda, MD  isosorbide mononitrate (IMDUR) 30 MG 24 hr tablet Take 1 tablet (30 mg total) by mouth daily. 12/15/14  Yes Rollene Rotunda, MD  nitroGLYCERIN (NITROSTAT) 0.4 MG SL tablet Place 1 tablet (0.4 mg total) under the tongue every 5 (five) minutes as needed for chest pain. 12/04/13  Yes Ok Anis, NP    Physical  Exam: Filed Vitals:   07/12/15 1900 07/12/15 1915 07/12/15 1930 07/12/15 2056  BP: 144/80 140/79  146/82  Pulse: 82 82  82  Temp:   98.6 F (37 C) 98.4 F (36.9 C)  TempSrc:    Oral  Resp: 18 18  18   Height:    6\' 1"  (1.854 m)  Weight:    102.694 kg (226 lb 6.4 oz)  SpO2: 98% 99%  98%     General:  Moderately built and nourished.  Eyes: Anicteric no pallor. Right periorbital hematoma. Patient is able to move his eyes without difficulty.  ENT: Right periorbital hematoma.  Neck: No neck rigidity.  Cardiovascular: S1 and S2 heard.  Respiratory: No rhonchi or crepitations.  Abdomen: Soft nontender bowel sounds present.  Skin: Right periorbital hematoma.  Musculoskeletal: No edema.  Psychiatric: Appears normal.  Neurologic: Alert awake oriented to time place and person. Moves all estimate is 5 x 5. Perla positive.  Labs on Admission:  Basic Metabolic Panel:  Recent Labs Lab 07/12/15 1532  NA 140  K 3.5  CL 106  CO2 28  GLUCOSE 133*  BUN 17  CREATININE 1.53*  CALCIUM 8.2*  MG 1.8  PHOS 3.0   Liver Function  Tests:  Recent Labs Lab 07/12/15 1532  AST 34  ALT 27  ALKPHOS 95  BILITOT 1.2  PROT 6.0*  ALBUMIN 3.4*   No results for input(s): LIPASE, AMYLASE in the last 168 hours. No results for input(s): AMMONIA in the last 168 hours. CBC:  Recent Labs Lab 07/12/15 1532  WBC 9.8  NEUTROABS 7.1  HGB 11.5*  HCT 35.2*  MCV 87.8  PLT 160   Cardiac Enzymes: No results for input(s): CKTOTAL, CKMB, CKMBINDEX, TROPONINI in the last 168 hours.  BNP (last 3 results) No results for input(s): BNP in the last 8760 hours.  ProBNP (last 3 results) No results for input(s): PROBNP in the last 8760 hours.  CBG:  Recent Labs Lab 07/12/15 1535  GLUCAP 125*    Radiological Exams on Admission: Dg Chest 2 View  07/12/2015  CLINICAL DATA:  Syncope EXAM: CHEST  2 VIEW COMPARISON:  12/04/2013 FINDINGS: Cardiomegaly with vascular congestion. Interstitial  prominence within the lungs could reflect early interstitial edema. Prior CABG. Left pacer is unchanged. No effusions. No acute bony abnormality. IMPRESSION: Cardiomegaly with vascular congestion and possible early interstitial edema. Electronically Signed   By: Charlett NoseKevin  Dover M.D.   On: 07/12/2015 16:14   Ct Head Wo Contrast  07/12/2015  CLINICAL DATA:  Syncopal episode.  Fell.  Hit head. EXAM: CT HEAD WITHOUT CONTRAST CT MAXILLOFACIAL WITHOUT CONTRAST CT CERVICAL SPINE WITHOUT CONTRAST TECHNIQUE: Multidetector CT imaging of the head, cervical spine, and maxillofacial structures were performed using the standard protocol without intravenous contrast. Multiplanar CT image reconstructions of the cervical spine and maxillofacial structures were also generated. COMPARISON:  Head CT 11/01/2013 FINDINGS: CT HEAD FINDINGS There is a small subdural hematoma in the right frontoparietal region with mild sulcal effacement. There is also a fair amount of subarachnoid hemorrhage in the right frontal area and in the right sylvian fissure. There is also a subdural hematoma along the right tentorium. Subarachnoid hemorrhage is also noted in the interhemispheric fissure anteriorly. There is a hemorrhagic contusion involving the right frontal lobe in the anterior cranial fossa inferiorly. Remote left occipital infarct with encephalomalacia. The ventricles are in the midline and stable in size and configuration. The brainstem and cerebellum appear normal. There is a nondisplaced but fairly extensive right frontal bone fracture beginning at the vertex and extending down to just above the right frontal sinus. The globes are intact. CT MAXILLOFACIAL FINDINGS There is a hematoma around the right orbit. There is a nondisplaced fracture of the orbital roof best seen on the sagittal reformatted images. No other definite facial bone fractures. The paranasal sinuses and mastoid air cells are clear. CT CERVICAL SPINE FINDINGS Advanced  degenerative cervical spondylosis with multilevel disc disease and facet disease. The most significant disc disease is at C5-6 an the facet disease is fairly diffuse and more significant. There are advanced degenerative changes at C1-2. The dens is intact. The cervical vertebral bodies are overall normally aligned. There is mild degenerative anterior subluxation of C7 compared to T1 and the facets are fused bilaterally at this level. The spinal canal is generous. No spinal stenosis. The sternoclavicular joints demonstrate moderate arthropathy, vertically on the right side. The lung apices are grossly clear. IMPRESSION: 1. Small right-sided subdural hematoma and along the right tentorium. There is also a fair amount of subarachnoid hemorrhage and a hemorrhagic contusion involving the right frontal lobe. The pattern of subarachnoid hemorrhage is most likely posttraumatic and less likely due to a ruptured aneurysm. CT angiography of the  brain may be helpful for further evaluation. 2. Nondisplaced right frontal skull fracture. 3. Nondisplaced orbital roof fracture. 4. Advanced degenerative cervical spondylosis but no acute cervical spine fracture. 5. Remote left sided occipital infarct with encephalomalacia. These results were called by telephone at the time of interpretation on 07/12/2015 at 5:07 pm to Dr. Crista Curb , who verbally acknowledged these results. Electronically Signed   By: Rudie Meyer M.D.   On: 07/12/2015 17:07   Ct Cervical Spine Wo Contrast  07/12/2015  CLINICAL DATA:  Syncopal episode.  Fell.  Hit head. EXAM: CT HEAD WITHOUT CONTRAST CT MAXILLOFACIAL WITHOUT CONTRAST CT CERVICAL SPINE WITHOUT CONTRAST TECHNIQUE: Multidetector CT imaging of the head, cervical spine, and maxillofacial structures were performed using the standard protocol without intravenous contrast. Multiplanar CT image reconstructions of the cervical spine and maxillofacial structures were also generated. COMPARISON:  Head CT  11/01/2013 FINDINGS: CT HEAD FINDINGS There is a small subdural hematoma in the right frontoparietal region with mild sulcal effacement. There is also a fair amount of subarachnoid hemorrhage in the right frontal area and in the right sylvian fissure. There is also a subdural hematoma along the right tentorium. Subarachnoid hemorrhage is also noted in the interhemispheric fissure anteriorly. There is a hemorrhagic contusion involving the right frontal lobe in the anterior cranial fossa inferiorly. Remote left occipital infarct with encephalomalacia. The ventricles are in the midline and stable in size and configuration. The brainstem and cerebellum appear normal. There is a nondisplaced but fairly extensive right frontal bone fracture beginning at the vertex and extending down to just above the right frontal sinus. The globes are intact. CT MAXILLOFACIAL FINDINGS There is a hematoma around the right orbit. There is a nondisplaced fracture of the orbital roof best seen on the sagittal reformatted images. No other definite facial bone fractures. The paranasal sinuses and mastoid air cells are clear. CT CERVICAL SPINE FINDINGS Advanced degenerative cervical spondylosis with multilevel disc disease and facet disease. The most significant disc disease is at C5-6 an the facet disease is fairly diffuse and more significant. There are advanced degenerative changes at C1-2. The dens is intact. The cervical vertebral bodies are overall normally aligned. There is mild degenerative anterior subluxation of C7 compared to T1 and the facets are fused bilaterally at this level. The spinal canal is generous. No spinal stenosis. The sternoclavicular joints demonstrate moderate arthropathy, vertically on the right side. The lung apices are grossly clear. IMPRESSION: 1. Small right-sided subdural hematoma and along the right tentorium. There is also a fair amount of subarachnoid hemorrhage and a hemorrhagic contusion involving the  right frontal lobe. The pattern of subarachnoid hemorrhage is most likely posttraumatic and less likely due to a ruptured aneurysm. CT angiography of the brain may be helpful for further evaluation. 2. Nondisplaced right frontal skull fracture. 3. Nondisplaced orbital roof fracture. 4. Advanced degenerative cervical spondylosis but no acute cervical spine fracture. 5. Remote left sided occipital infarct with encephalomalacia. These results were called by telephone at the time of interpretation on 07/12/2015 at 5:07 pm to Dr. Crista Curb , who verbally acknowledged these results. Electronically Signed   By: Rudie Meyer M.D.   On: 07/12/2015 17:07   Ct Maxillofacial Wo Cm  07/12/2015  CLINICAL DATA:  Syncopal episode.  Fell.  Hit head. EXAM: CT HEAD WITHOUT CONTRAST CT MAXILLOFACIAL WITHOUT CONTRAST CT CERVICAL SPINE WITHOUT CONTRAST TECHNIQUE: Multidetector CT imaging of the head, cervical spine, and maxillofacial structures were performed using the standard protocol without intravenous  contrast. Multiplanar CT image reconstructions of the cervical spine and maxillofacial structures were also generated. COMPARISON:  Head CT 11/01/2013 FINDINGS: CT HEAD FINDINGS There is a small subdural hematoma in the right frontoparietal region with mild sulcal effacement. There is also a fair amount of subarachnoid hemorrhage in the right frontal area and in the right sylvian fissure. There is also a subdural hematoma along the right tentorium. Subarachnoid hemorrhage is also noted in the interhemispheric fissure anteriorly. There is a hemorrhagic contusion involving the right frontal lobe in the anterior cranial fossa inferiorly. Remote left occipital infarct with encephalomalacia. The ventricles are in the midline and stable in size and configuration. The brainstem and cerebellum appear normal. There is a nondisplaced but fairly extensive right frontal bone fracture beginning at the vertex and extending down to just above the  right frontal sinus. The globes are intact. CT MAXILLOFACIAL FINDINGS There is a hematoma around the right orbit. There is a nondisplaced fracture of the orbital roof best seen on the sagittal reformatted images. No other definite facial bone fractures. The paranasal sinuses and mastoid air cells are clear. CT CERVICAL SPINE FINDINGS Advanced degenerative cervical spondylosis with multilevel disc disease and facet disease. The most significant disc disease is at C5-6 an the facet disease is fairly diffuse and more significant. There are advanced degenerative changes at C1-2. The dens is intact. The cervical vertebral bodies are overall normally aligned. There is mild degenerative anterior subluxation of C7 compared to T1 and the facets are fused bilaterally at this level. The spinal canal is generous. No spinal stenosis. The sternoclavicular joints demonstrate moderate arthropathy, vertically on the right side. The lung apices are grossly clear. IMPRESSION: 1. Small right-sided subdural hematoma and along the right tentorium. There is also a fair amount of subarachnoid hemorrhage and a hemorrhagic contusion involving the right frontal lobe. The pattern of subarachnoid hemorrhage is most likely posttraumatic and less likely due to a ruptured aneurysm. CT angiography of the brain may be helpful for further evaluation. 2. Nondisplaced right frontal skull fracture. 3. Nondisplaced orbital roof fracture. 4. Advanced degenerative cervical spondylosis but no acute cervical spine fracture. 5. Remote left sided occipital infarct with encephalomalacia. These results were called by telephone at the time of interpretation on 07/12/2015 at 5:07 pm to Dr. Crista Curb , who verbally acknowledged these results. Electronically Signed   By: Rudie Meyer M.D.   On: 07/12/2015 17:07    EKG: Independently reviewed. Normal sinus rhythm with IVCD.  Assessment/Plan Principal Problem:   Intracranial bleed (HCC) Active Problems:    Essential hypertension, benign   High cholesterol   Ventricular tachycardia- recurrent   History of MVR (tissue valve) 5/14   Cardiac arrest- out of hospital cardiac arrest,   CAD - CABG x 3 5/14- patent grafts 11/01/13   Cardiomyopathy, ischemic- EF down to 15% by echo 11/02/13   Chronic systolic heart failure (HCC)   H/O Embolic stroke involving left posterior cerebral artery (HCC)   SDH (subdural hematoma) (HCC)   1. Traumatic intracranial bleed with skull fracture and right orbital fracture - on call neurosurgeon Dr. Conchita Paris has been consulted. Repeat CT head has been ordered for morning. Patient will be placed on neuro checks. Discontinue aspirin. 2. Recurrent VT causing syncopal episode - appreciate cardiology consult. Closely follow magnesium and potassium levels. Cycle cardiac markers. Continue patient's home medications including bisoprolol which patient may have missed. 3. CAD status post CABG and Bioprosthetic mitral valve replacement - discontinuing aspirin secondary to intracranial  bleed. 4. Ischemic cardiomyopathy status post AICD placement last EF measured was 20% - continue metoprolol. 5. Chronic kidney disease stage III - creatinine appears to be at baseline. 6. Anemia - follow CBC.  I have reviewed patient's old charts, labs. Personally reviewed the patient's EKG and chest x-ray.   DVT Prophylaxis SCDs.  Code Status: Full code.  Family Communication: Discussed with patient.  Disposition Plan: Admit for observation.    KAKRAKANDY,ARSHAD N. [facility] Pager [phone].  If 7PM-7AM, please contact night-coverage [email or web address] Password [email or web address] 07/12/2015, 11:46 PM

## 2015-07-12 NOTE — ED Notes (Signed)
Neuro surgery at bedside.

## 2015-07-12 NOTE — ED Provider Notes (Addendum)
CSN: [id number]     Arrival date & time 07/12/15  1446 History   First MD Initiated Contact with Patient 07/12/15 1500     Chief Complaint  Patient presents with  . Loss of Consciousness     (Consider location/radiation/quality/duration/timing/severity/associated sxs/prior Treatment) HPI   69 year old male who presents with syncope. History of hypertension, hyperlipidemia, CAD, ischemic cardiomyopathy with EF of 20-25% status post AICD and pacemaker.   Recently discontinued on Eliquis, and currently taking aspirin.Has been in his usual state of health, and today while eating lunch with a coworker had syncope. I did not have any prodromal symptoms associated with this, and was witnessed by his coworker to have loss of consciousness for about 1 minute. He was briefly disoriented for a few minutes, but quickly return to baseline. He did not see any seizure-like activity and he did not have any tongue biting or urinary incontinence. Was subsequently brought to the ED for evaluation. He reports otherwise being at his baseline, and family states that he is at his normal mental status. He  does complain of mild headache, as well as pain around his right eye where he has bruising. Denies double vision, speech changes, numbness or weakness, chest pain, difficulty breathing, nausea or vomiting, lightheadedness, palpitations, neck pain back pain, abdominal pain, or recent illnesses.   Past Medical History  Diagnosis Date  . High cholesterol   . Hypertension   . Constipation   . Reflux   . Complication of anesthesia     "Hard time waking me up" after sedation after dental procedure  . CAD (coronary artery disease)     LAD 95% proximal stenosis, D1 50-60% stenosis, the circumflex 40% stenosis, RCA subtotal stenosis.  . Cardiomyopathy, ischemic     EF was 30-35% by echo but 45-50% by cath.  Most recent EF 15%.    . Mitral regurgitation     Secondary to papillary muscle rupture  . H/O cardiac arrest    . Atrial tachycardia (HCC)   . Ventricular fibrillation (HCC) 02/16/14    successfully defibrillation by his ICD   Past Surgical History  Procedure Laterality Date  . Stomach ulcer repair    . Tee without cardioversion N/A 02/04/2013    Procedure: TRANSESOPHAGEAL ECHOCARDIOGRAM (TEE);  Surgeon: Vesta Mixer, MD;  Location: [facility] ENDOSCOPY;  Service: Cardiovascular;  Laterality: N/A;  . Intraoperative transesophageal echocardiogram N/A 02/05/2013    Procedure: INTRAOPERATIVE TRANSESOPHAGEAL ECHOCARDIOGRAM;  Surgeon: [name], MD;  Location: [facility] OR;  Service: Open Heart Surgery;  Laterality: N/A;  . Endovein harvest of greater saphenous vein Right 02/05/2013    Procedure: ENDOVEIN HARVEST OF GREATER SAPHENOUS VEIN;  Surgeon: [name], MD;  Location: [facility] OR;  Service: Open Heart Surgery;  Laterality: Right;  . Patent foramen ovale closure N/A 02/05/2013    Procedure: PATENT FORAMEN OVALE CLOSURE;  Surgeon: [name], MD;  Location: [facility] OR;  Service: Open Heart Surgery;  Laterality: N/A;  . Mitral valve replacement (mvr)/coronary artery bypass grafting (cabg) N/A 02/05/2013    Procedure: MITRAL VALVE REPLACEMENT (MVR)/CORONARY ARTERY BYPASS GRAFTING (CABG);  Surgeon: [name], MD;  Location: [facility] OR;  Service: Open Heart Surgery;  Laterality: N/A;  x3 using right greater saphenous vein and left internal mammary.   . Coronary artery bypass graft    . Implantable cardioverter defibrillator implant  12/03/2013    STJ Fortify ICD implanted for secondary prevention by Dr Johney Frame  . Cardioversion N/A 01/25/2014    Procedure:  CARDIOVERSION;  Surgeon: Wendall Stade, MD;  Location: [facility] ENDOSCOPY;  Service: Cardiovascular;  Laterality: N/A;  . Left heart catheterization with coronary angiogram N/A 02/03/2013    Procedure: LEFT HEART CATHETERIZATION WITH CORONARY ANGIOGRAM;  Surgeon: Iran Ouch, MD;  Location: [facility] CATH LAB;  Service: Cardiovascular;  Laterality: N/A;  .  Left heart catheterization with coronary/graft angiogram N/A 11/01/2013    Procedure: LEFT HEART CATHETERIZATION WITH Isabel Caprice;  Surgeon: Peter M Swaziland, MD;  Location: [facility] CATH LAB;  Service: Cardiovascular;  Laterality: N/A;  . Implantable cardioverter defibrillator implant N/A 12/03/2013    Procedure: IMPLANTABLE CARDIOVERTER DEFIBRILLATOR IMPLANT;  Surgeon: Gardiner Rhyme, MD;  Location: [facility] CATH LAB;  Service: Cardiovascular;  Laterality: N/A;   Family History  Problem Relation Age of Onset  . Heart attack Mother 33   Social History  Substance Use Topics  . Smoking status: Former Smoker    Types: Cigarettes    Quit date: 06/04/1974  . Smokeless tobacco: None     Comment: 40 yrs ago  . Alcohol Use: 0.0 oz/week    0 Standard drinks or equivalent per week     Comment: week end - 1/5 of crown- 9/25 ocassionally-AJ    Review of Systems 10/14 systems reviewed and are negative other than those stated in the HPI    Allergies  Review of patient's allergies indicates no known allergies.  Home Medications   Prior to Admission medications   Medication Sig Start Date End Date Taking? Authorizing Provider  acetaminophen (TYLENOL) 500 MG tablet Take 500 mg by mouth every 6 (six) hours as needed for mild pain or moderate pain.    Yes Historical Provider, MD  aspirin EC 81 MG tablet Take 1 tablet (81 mg total) by mouth daily. 06/17/15  Yes Hillis Range, MD  atorvastatin (LIPITOR) 80 MG tablet Take 1 tablet (80 mg total) by mouth daily at 6 PM. 12/15/14  Yes Rollene Rotunda, MD  bisoprolol (ZEBETA) 10 MG tablet TAKE ONE TABLET BY MOUTH ONCE DAILY 05/31/15  Yes Rollene Rotunda, MD  furosemide (LASIX) 40 MG tablet Take 1 tablet (40 mg total) by mouth daily. 12/15/14  Yes Rollene Rotunda, MD  hydrALAZINE (APRESOLINE) 25 MG tablet Take 1 tablet (25 mg total) by mouth every 8 (eight) hours. 12/15/14  Yes Rollene Rotunda, MD  isosorbide mononitrate (IMDUR) 30 MG 24 hr tablet Take 1 tablet (30 mg  total) by mouth daily. 12/15/14  Yes Rollene Rotunda, MD  nitroGLYCERIN (NITROSTAT) 0.4 MG SL tablet Place 1 tablet (0.4 mg total) under the tongue every 5 (five) minutes as needed for chest pain. 12/04/13  Yes Ok Anis, NP   BP 146/82 mmHg  Pulse 82  Temp(Src) 98.4 F (36.9 C) (Oral)  Resp 18  Ht 6\' 1"  (1.854 m)  Wt 226 lb 6.4 oz (102.694 kg)  BMI 29.88 kg/m2  SpO2 98% Physical Exam Physical Exam  Nursing note and vitals reviewed. Constitutional: Well developed, well nourished, non-toxic, and in no acute distress Head: Normocephalic. Large right periorbital ecchymosis with edema.  Mouth/Throat: Oropharynx is clear and moist.  Neck: Cervical collar in place. No midline tenderness or step-offs.  Cardiovascular: Normal rate and regular rhythm.  +2 radial pulses bilaterally Pulmonary/Chest: Effort normal and breath sounds normal. No chest wall tednerness. Abdominal: Soft. There is no tenderness. There is no rebound and no guarding.  Musculoskeletal: Normal range of motion and no deformities.  Neurological: Alert, no facial droop, fluent speech, moves all extremities symmetrically. Sensation  to light touch in tact throughout. PERRL. EOMI bilaterally. Skin: Skin is warm and dry.  Psychiatric: Cooperative  ED Course  Procedures (including critical care time) Labs Review Labs Reviewed  URINALYSIS, ROUTINE W REFLEX MICROSCOPIC (NOT AT [facility]) - Abnormal; Notable for the following:    APPearance CLOUDY (*)    Leukocytes, UA TRACE (*)    All other components within normal limits  CBC WITH DIFFERENTIAL/PLATELET - Abnormal; Notable for the following:    RBC 4.01 (*)    Hemoglobin 11.5 (*)    HCT 35.2 (*)    All other components within normal limits  COMPREHENSIVE METABOLIC PANEL - Abnormal; Notable for the following:    Glucose, Bld 133 (*)    Creatinine, Ser 1.53 (*)    Calcium 8.2 (*)    Total Protein 6.0 (*)    Albumin 3.4 (*)    GFR calc non Af Amer 44 (*)    GFR calc Af  Amer 51 (*)    All other components within normal limits  URINE MICROSCOPIC-ADD ON - Abnormal; Notable for the following:    Casts GRANULAR CAST (*)    All other components within normal limits  CBG MONITORING, ED - Abnormal; Notable for the following:    Glucose-Capillary 125 (*)    All other components within normal limits  MAGNESIUM  PHOSPHORUS  BASIC METABOLIC PANEL  TSH  MAGNESIUM  I-STAT TROPOININ, ED  I-STAT TROPOININ, ED    Imaging Review Dg Chest 2 View  07/12/2015  CLINICAL DATA:  Syncope EXAM: CHEST  2 VIEW COMPARISON:  12/04/2013 FINDINGS: Cardiomegaly with vascular congestion. Interstitial prominence within the lungs could reflect early interstitial edema. Prior CABG. Left pacer is unchanged. No effusions. No acute bony abnormality. IMPRESSION: Cardiomegaly with vascular congestion and possible early interstitial edema. Electronically Signed   By: Charlett Nose M.D.   On: 07/12/2015 16:14   Ct Head Wo Contrast  07/12/2015  CLINICAL DATA:  Syncopal episode.  Fell.  Hit head. EXAM: CT HEAD WITHOUT CONTRAST CT MAXILLOFACIAL WITHOUT CONTRAST CT CERVICAL SPINE WITHOUT CONTRAST TECHNIQUE: Multidetector CT imaging of the head, cervical spine, and maxillofacial structures were performed using the standard protocol without intravenous contrast. Multiplanar CT image reconstructions of the cervical spine and maxillofacial structures were also generated. COMPARISON:  Head CT 11/01/2013 FINDINGS: CT HEAD FINDINGS There is a small subdural hematoma in the right frontoparietal region with mild sulcal effacement. There is also a fair amount of subarachnoid hemorrhage in the right frontal area and in the right sylvian fissure. There is also a subdural hematoma along the right tentorium. Subarachnoid hemorrhage is also noted in the interhemispheric fissure anteriorly. There is a hemorrhagic contusion involving the right frontal lobe in the anterior cranial fossa inferiorly. Remote left occipital  infarct with encephalomalacia. The ventricles are in the midline and stable in size and configuration. The brainstem and cerebellum appear normal. There is a nondisplaced but fairly extensive right frontal bone fracture beginning at the vertex and extending down to just above the right frontal sinus. The globes are intact. CT MAXILLOFACIAL FINDINGS There is a hematoma around the right orbit. There is a nondisplaced fracture of the orbital roof best seen on the sagittal reformatted images. No other definite facial bone fractures. The paranasal sinuses and mastoid air cells are clear. CT CERVICAL SPINE FINDINGS Advanced degenerative cervical spondylosis with multilevel disc disease and facet disease. The most significant disc disease is at C5-6 an the facet disease is fairly diffuse and more significant.  There are advanced degenerative changes at C1-2. The dens is intact. The cervical vertebral bodies are overall normally aligned. There is mild degenerative anterior subluxation of C7 compared to T1 and the facets are fused bilaterally at this level. The spinal canal is generous. No spinal stenosis. The sternoclavicular joints demonstrate moderate arthropathy, vertically on the right side. The lung apices are grossly clear. IMPRESSION: 1. Small right-sided subdural hematoma and along the right tentorium. There is also a fair amount of subarachnoid hemorrhage and a hemorrhagic contusion involving the right frontal lobe. The pattern of subarachnoid hemorrhage is most likely posttraumatic and less likely due to a ruptured aneurysm. CT angiography of the brain may be helpful for further evaluation. 2. Nondisplaced right frontal skull fracture. 3. Nondisplaced orbital roof fracture. 4. Advanced degenerative cervical spondylosis but no acute cervical spine fracture. 5. Remote left sided occipital infarct with encephalomalacia. These results were called by telephone at the time of interpretation on 07/12/2015 at 5:07 pm to Dr.  Crista Curb , who verbally acknowledged these results. Electronically Signed   By: Rudie Meyer M.D.   On: 07/12/2015 17:07   Ct Cervical Spine Wo Contrast  07/12/2015  CLINICAL DATA:  Syncopal episode.  Fell.  Hit head. EXAM: CT HEAD WITHOUT CONTRAST CT MAXILLOFACIAL WITHOUT CONTRAST CT CERVICAL SPINE WITHOUT CONTRAST TECHNIQUE: Multidetector CT imaging of the head, cervical spine, and maxillofacial structures were performed using the standard protocol without intravenous contrast. Multiplanar CT image reconstructions of the cervical spine and maxillofacial structures were also generated. COMPARISON:  Head CT 11/01/2013 FINDINGS: CT HEAD FINDINGS There is a small subdural hematoma in the right frontoparietal region with mild sulcal effacement. There is also a fair amount of subarachnoid hemorrhage in the right frontal area and in the right sylvian fissure. There is also a subdural hematoma along the right tentorium. Subarachnoid hemorrhage is also noted in the interhemispheric fissure anteriorly. There is a hemorrhagic contusion involving the right frontal lobe in the anterior cranial fossa inferiorly. Remote left occipital infarct with encephalomalacia. The ventricles are in the midline and stable in size and configuration. The brainstem and cerebellum appear normal. There is a nondisplaced but fairly extensive right frontal bone fracture beginning at the vertex and extending down to just above the right frontal sinus. The globes are intact. CT MAXILLOFACIAL FINDINGS There is a hematoma around the right orbit. There is a nondisplaced fracture of the orbital roof best seen on the sagittal reformatted images. No other definite facial bone fractures. The paranasal sinuses and mastoid air cells are clear. CT CERVICAL SPINE FINDINGS Advanced degenerative cervical spondylosis with multilevel disc disease and facet disease. The most significant disc disease is at C5-6 an the facet disease is fairly diffuse and more  significant. There are advanced degenerative changes at C1-2. The dens is intact. The cervical vertebral bodies are overall normally aligned. There is mild degenerative anterior subluxation of C7 compared to T1 and the facets are fused bilaterally at this level. The spinal canal is generous. No spinal stenosis. The sternoclavicular joints demonstrate moderate arthropathy, vertically on the right side. The lung apices are grossly clear. IMPRESSION: 1. Small right-sided subdural hematoma and along the right tentorium. There is also a fair amount of subarachnoid hemorrhage and a hemorrhagic contusion involving the right frontal lobe. The pattern of subarachnoid hemorrhage is most likely posttraumatic and less likely due to a ruptured aneurysm. CT angiography of the brain may be helpful for further evaluation. 2. Nondisplaced right frontal skull fracture. 3. Nondisplaced orbital  roof fracture. 4. Advanced degenerative cervical spondylosis but no acute cervical spine fracture. 5. Remote left sided occipital infarct with encephalomalacia. These results were called by telephone at the time of interpretation on 07/12/2015 at 5:07 pm to Dr. Crista CurbANA Omaya Nieland , who verbally acknowledged these results. Electronically Signed   By: Rudie MeyerP.  Gallerani M.D.   On: 07/12/2015 17:07   Ct Maxillofacial Wo Cm  07/12/2015  CLINICAL DATA:  Syncopal episode.  Fell.  Hit head. EXAM: CT HEAD WITHOUT CONTRAST CT MAXILLOFACIAL WITHOUT CONTRAST CT CERVICAL SPINE WITHOUT CONTRAST TECHNIQUE: Multidetector CT imaging of the head, cervical spine, and maxillofacial structures were performed using the standard protocol without intravenous contrast. Multiplanar CT image reconstructions of the cervical spine and maxillofacial structures were also generated. COMPARISON:  Head CT 11/01/2013 FINDINGS: CT HEAD FINDINGS There is a small subdural hematoma in the right frontoparietal region with mild sulcal effacement. There is also a fair amount of subarachnoid  hemorrhage in the right frontal area and in the right sylvian fissure. There is also a subdural hematoma along the right tentorium. Subarachnoid hemorrhage is also noted in the interhemispheric fissure anteriorly. There is a hemorrhagic contusion involving the right frontal lobe in the anterior cranial fossa inferiorly. Remote left occipital infarct with encephalomalacia. The ventricles are in the midline and stable in size and configuration. The brainstem and cerebellum appear normal. There is a nondisplaced but fairly extensive right frontal bone fracture beginning at the vertex and extending down to just above the right frontal sinus. The globes are intact. CT MAXILLOFACIAL FINDINGS There is a hematoma around the right orbit. There is a nondisplaced fracture of the orbital roof best seen on the sagittal reformatted images. No other definite facial bone fractures. The paranasal sinuses and mastoid air cells are clear. CT CERVICAL SPINE FINDINGS Advanced degenerative cervical spondylosis with multilevel disc disease and facet disease. The most significant disc disease is at C5-6 an the facet disease is fairly diffuse and more significant. There are advanced degenerative changes at C1-2. The dens is intact. The cervical vertebral bodies are overall normally aligned. There is mild degenerative anterior subluxation of C7 compared to T1 and the facets are fused bilaterally at this level. The spinal canal is generous. No spinal stenosis. The sternoclavicular joints demonstrate moderate arthropathy, vertically on the right side. The lung apices are grossly clear. IMPRESSION: 1. Small right-sided subdural hematoma and along the right tentorium. There is also a fair amount of subarachnoid hemorrhage and a hemorrhagic contusion involving the right frontal lobe. The pattern of subarachnoid hemorrhage is most likely posttraumatic and less likely due to a ruptured aneurysm. CT angiography of the brain may be helpful for  further evaluation. 2. Nondisplaced right frontal skull fracture. 3. Nondisplaced orbital roof fracture. 4. Advanced degenerative cervical spondylosis but no acute cervical spine fracture. 5. Remote left sided occipital infarct with encephalomalacia. These results were called by telephone at the time of interpretation on 07/12/2015 at 5:07 pm to Dr. Crista CurbANA Billijo Dilling , who verbally acknowledged these results. Electronically Signed   By: Rudie MeyerP.  Gallerani M.D.   On: 07/12/2015 17:07   I have personally reviewed and evaluated these images and lab results as part of my medical decision-making.   EKG Interpretation   Date/Time:  Tuesday July 12 2015 14:55:27 EDT Ventricular Rate:  84 PR Interval:  155 QRS Duration: 200 QT Interval:  504 QTC Calculation: 596 R Axis:   -72 Text Interpretation:  Sinus rhythm Nonspecific IVCD with LAD Left  ventricular hypertrophy Abnormal  T, consider ischemia, lateral leads  Ventricularly paced rhythm with PACs Reconfirmed by Yuriko Portales MD, Christe Tellez [phone])  on 07/12/2015 3:05:56 PM       CRITICAL CARE Performed by: Lavera Guise   Total critical care time: 35 minutes  Critical care time was exclusive of separately billable procedures and treating other patients.  Critical care was necessary to treat or prevent imminent or life-threatening deterioration.  Critical care was time spent personally by me on the following activities: development of treatment plan with patient and/or surrogate as well as nursing, discussions with consultants, evaluation of patient's response to treatment, examination of patient, obtaining history from patient or surrogate, ordering and performing treatments and interventions, ordering and review of laboratory studies, ordering and review of radiographic studies, pulse oximetry and re-evaluation of patient's condition.   MDM   Final diagnoses:  SAH (subarachnoid hemorrhage) (HCC)  SDH (subdural hematoma) (HCC)  Intraparenchymal hematoma of brain,  right, with loss of consciousness of 30 minutes or less, initial encounter [facility])  Ventricular arrhythmia  AICD discharge    69 year old male with history of ischemic cardiomyopathy status post AICD/pacemaker, CAD status post CABG, on aspirin who presents with syncopal episode. He is behaving appropriately on presentation, and neurologically intact. Vital signs are non-concerning. large right periorbital hematoma/ecchymoses,  and no other evidence of acute traumatic injuries on exam. EKG with ventricularly paced rhythm. AICD is interrogated, revealing episode of ventricular tachycardia at a rate of 300, for which she was appropriately defibrillated back into normal sinus rhythm. No electrolyte derangements to explain this. No other metabolic derangements noted either. CXR without acute cardiopulmonary processes. Dr. Wyline Mood, from Cardiology is consulted regarding management of this, with recommendations pending.  In regards to his trauma workup, CT head, cervical spine, and face is performed. This is visualized and reviewed with radiology. He has evidence of of traumatic SAH, SDH, and  intraparenchymal hematoma. He also has a nondisplaced right frontal skull fracture, no nondisplaced superior orbital wall fracture without evidence of entrapment. No evidence of cervical spine injury, and he is clinically cleared from his cervical collar. Discussed findings with Dr. Conchita Paris from neurosurgery, who  recommended frequent neuro checks and repeat CT scan in 24 hours.  Findings discussed with Dr. Toniann Fail from hospitalist service, who will admit for ongoing management.   Lavera Guise, MD 07/12/15 [id number]  Lavera Guise, MD 07/12/15 [phone]

## 2015-07-12 NOTE — ED Notes (Signed)
Per EMS, Patient was at work when he passed out unexpectedly. Patient has no recollection of the event. Patient hit right eye on machine when he had a syncopal episode. When EMS arrived, patient was confused. Patient is Alert x2. Vitals WNL

## 2015-07-12 NOTE — Consult Note (Signed)
CC:  Chief Complaint  Patient presents with  . Loss of Consciousness    HPI: Lonnie [name] is a 69 y.o. male presenting to the ED via EMS after passing out at work. Of note, he does have a history of CAD, cardiomyopathy, and dysrhythmia with an AICD. He stopped Eliquis a few weeks ago and currently is on  ASA only. He is completely amnestic to the events, but his wife states that she was told he was at work standing by some machinery and fell to the ground, hitting his head on something on the way down. He currently does c/o a HA, but denies visual changes, weakness, N/T.   PMH: Past Medical History  Diagnosis Date  . High cholesterol   . Hypertension   . Constipation   . Reflux   . Complication of anesthesia     "Hard time waking me up" after sedation after dental procedure  . CAD (coronary artery disease)     LAD 95% proximal stenosis, D1 50-60% stenosis, the circumflex 40% stenosis, RCA subtotal stenosis.  . Cardiomyopathy, ischemic     EF was 30-35% by echo but 45-50% by cath.  Most recent EF 15%.    . Mitral regurgitation     Secondary to papillary muscle rupture  . H/O cardiac arrest   . Atrial tachycardia (HCC)   . Ventricular fibrillation (HCC) 02/16/14    successfully defibrillation by his ICD    PSH: Past Surgical History  Procedure Laterality Date  . Stomach ulcer repair    . Tee without cardioversion N/A 02/04/2013    Procedure: TRANSESOPHAGEAL ECHOCARDIOGRAM (TEE);  Surgeon: Vesta Mixer, MD;  Location: [facility] ENDOSCOPY;  Service: Cardiovascular;  Laterality: N/A;  . Intraoperative transesophageal echocardiogram N/A 02/05/2013    Procedure: INTRAOPERATIVE TRANSESOPHAGEAL ECHOCARDIOGRAM;  Surgeon: [name], MD;  Location: [facility] OR;  Service: Open Heart Surgery;  Laterality: N/A;  . Endovein harvest of greater saphenous vein Right 02/05/2013    Procedure: ENDOVEIN HARVEST OF GREATER SAPHENOUS VEIN;  Surgeon: [name], MD;  Location: [facility] OR;   Service: Open Heart Surgery;  Laterality: Right;  . Patent foramen ovale closure N/A 02/05/2013    Procedure: PATENT FORAMEN OVALE CLOSURE;  Surgeon: [name], MD;  Location: [facility] OR;  Service: Open Heart Surgery;  Laterality: N/A;  . Mitral valve replacement (mvr)/coronary artery bypass grafting (cabg) N/A 02/05/2013    Procedure: MITRAL VALVE REPLACEMENT (MVR)/CORONARY ARTERY BYPASS GRAFTING (CABG);  Surgeon: [name], MD;  Location: [facility] OR;  Service: Open Heart Surgery;  Laterality: N/A;  x3 using right greater saphenous vein and left internal mammary.   . Coronary artery bypass graft    . Implantable cardioverter defibrillator implant  12/03/2013    STJ Fortify ICD implanted for secondary prevention by Dr Johney Frame  . Cardioversion N/A 01/25/2014    Procedure: CARDIOVERSION;  Surgeon: Wendall Stade, MD;  Location: [facility] ENDOSCOPY;  Service: Cardiovascular;  Laterality: N/A;  . Left heart catheterization with coronary angiogram N/A 02/03/2013    Procedure: LEFT HEART CATHETERIZATION WITH CORONARY ANGIOGRAM;  Surgeon: Iran Ouch, MD;  Location: [facility] CATH LAB;  Service: Cardiovascular;  Laterality: N/A;  . Left heart catheterization with coronary/graft angiogram N/A 11/01/2013    Procedure: LEFT HEART CATHETERIZATION WITH Isabel Caprice;  Surgeon: Peter M Swaziland, MD;  Location: [facility] CATH LAB;  Service: Cardiovascular;  Laterality: N/A;  . Implantable cardioverter defibrillator implant N/A 12/03/2013    Procedure: IMPLANTABLE CARDIOVERTER DEFIBRILLATOR IMPLANT;  Surgeon:  Gardiner RhymeJames D Allred, MD;  Location: [facility] CATH LAB;  Service: Cardiovascular;  Laterality: N/A;    SH: Social History  Substance Use Topics  . Smoking status: Former Smoker    Types: Cigarettes    Quit date: 06/04/1974  . Smokeless tobacco: None     Comment: 40 yrs ago  . Alcohol Use: 0.0 oz/week    0 Standard drinks or equivalent per week     Comment: week end - 1/5 of crown- 9/25 ocassionally-AJ     MEDS: Prior to Admission medications   Medication Sig Start Date End Date Taking? Authorizing Provider  acetaminophen (TYLENOL) 500 MG tablet Take 500 mg by mouth every 6 (six) hours as needed for mild pain or moderate pain.    Yes Historical Provider, MD  aspirin EC 81 MG tablet Take 1 tablet (81 mg total) by mouth daily. 06/17/15  Yes Hillis RangeJames Allred, MD  atorvastatin (LIPITOR) 80 MG tablet Take 1 tablet (80 mg total) by mouth daily at 6 PM. 12/15/14  Yes Rollene RotundaJames Hochrein, MD  bisoprolol (ZEBETA) 10 MG tablet TAKE ONE TABLET BY MOUTH ONCE DAILY 05/31/15  Yes Rollene RotundaJames Hochrein, MD  furosemide (LASIX) 40 MG tablet Take 1 tablet (40 mg total) by mouth daily. 12/15/14  Yes Rollene RotundaJames Hochrein, MD  hydrALAZINE (APRESOLINE) 25 MG tablet Take 1 tablet (25 mg total) by mouth every 8 (eight) hours. 12/15/14  Yes Rollene RotundaJames Hochrein, MD  isosorbide mononitrate (IMDUR) 30 MG 24 hr tablet Take 1 tablet (30 mg total) by mouth daily. 12/15/14  Yes Rollene RotundaJames Hochrein, MD  nitroGLYCERIN (NITROSTAT) 0.4 MG SL tablet Place 1 tablet (0.4 mg total) under the tongue every 5 (five) minutes as needed for chest pain. 12/04/13  Yes Ok Anishristopher R Berge, NP    ALLERGY: No Known Allergies  ROS: ROS  NEUROLOGIC EXAM: Awake, alert, oriented Memory and concentration grossly intact Speech fluent, appropriate CN grossly intact Significant periorbital ecchymoses/edema on the right Motor exam: Upper Extremities Deltoid Bicep Tricep Grip  Right 5/5 5/5 5/5 5/5  Left 5/5 5/5 5/5 5/5   Lower Extremity IP Quad PF DF EHL  Right 5/5 5/5 5/5 5/5 5/5  Left 5/5 5/5 5/5 5/5 5/5   Sensation grossly intact to LT  IMGAING: CTH w/o demonstrates convexity/sylvian SAH on the right without basal SAH. There is right orbitofrontal contusion, and small right SDH. No significant mass effect. No HCP. There is a linear, non-displaced frontal skull fx.  IMPRESSION: - 69 y.o. male with syncopal episode and subsequent fall leading to traumatic SAH/SDH and  contusions. Currently neurologically intact.  PLAN: - Repeat CTH in the AM. - Will start keppra 500mg  PO BID x 7d for early post-traumatic SZ prophylaxis - SBP goal <16860mmHg - Cont to hold all anticoagulants/antiplatelet agents.

## 2015-07-12 NOTE — ED Notes (Signed)
[employer] called and state the patient received a shock around 131 pm for vfib, rate was in 300s. Pt converted to a normal rhythm after.

## 2015-07-12 NOTE — ED Notes (Signed)
Pt transported to xray 

## 2015-07-13 ENCOUNTER — Encounter (HOSPITAL_COMMUNITY): Payer: Self-pay | Admitting: Radiology

## 2015-07-13 ENCOUNTER — Inpatient Hospital Stay (HOSPITAL_COMMUNITY): Payer: Managed Care, Other (non HMO)

## 2015-07-13 DIAGNOSIS — I6200 Nontraumatic subdural hemorrhage, unspecified: Secondary | ICD-10-CM

## 2015-07-13 DIAGNOSIS — I255 Ischemic cardiomyopathy: Secondary | ICD-10-CM

## 2015-07-13 DIAGNOSIS — I10 Essential (primary) hypertension: Secondary | ICD-10-CM

## 2015-07-13 DIAGNOSIS — E7800 Pure hypercholesterolemia, unspecified: Secondary | ICD-10-CM

## 2015-07-13 DIAGNOSIS — I4901 Ventricular fibrillation: Secondary | ICD-10-CM

## 2015-07-13 DIAGNOSIS — I5022 Chronic systolic (congestive) heart failure: Secondary | ICD-10-CM

## 2015-07-13 DIAGNOSIS — I629 Nontraumatic intracranial hemorrhage, unspecified: Secondary | ICD-10-CM

## 2015-07-13 LAB — CBC WITH DIFFERENTIAL/PLATELET
BASOS ABS: 0 10*3/uL (ref 0.0–0.1)
Basophils Relative: 0 %
Eosinophils Absolute: 0 10*3/uL (ref 0.0–0.7)
Eosinophils Relative: 0 %
HEMATOCRIT: 34.4 % — AB (ref 39.0–52.0)
Hemoglobin: 11.3 g/dL — ABNORMAL LOW (ref 13.0–17.0)
LYMPHS PCT: 13 %
Lymphs Abs: 1.9 10*3/uL (ref 0.7–4.0)
MCH: 29.2 pg (ref 26.0–34.0)
MCHC: 32.8 g/dL (ref 30.0–36.0)
MCV: 88.9 fL (ref 78.0–100.0)
Monocytes Absolute: 1.5 10*3/uL — ABNORMAL HIGH (ref 0.1–1.0)
Monocytes Relative: 11 %
NEUTROS ABS: 11.2 10*3/uL — AB (ref 1.7–7.7)
NEUTROS PCT: 76 %
Platelets: 147 10*3/uL — ABNORMAL LOW (ref 150–400)
RBC: 3.87 MIL/uL — AB (ref 4.22–5.81)
RDW: 14.9 % (ref 11.5–15.5)
WBC: 14.6 10*3/uL — AB (ref 4.0–10.5)

## 2015-07-13 LAB — COMPREHENSIVE METABOLIC PANEL
ALBUMIN: 3.5 g/dL (ref 3.5–5.0)
ALK PHOS: 94 U/L (ref 38–126)
ALT: 27 U/L (ref 17–63)
ANION GAP: 9 (ref 5–15)
AST: 36 U/L (ref 15–41)
BUN: 15 mg/dL (ref 6–20)
CO2: 23 mmol/L (ref 22–32)
Calcium: 8.9 mg/dL (ref 8.9–10.3)
Chloride: 104 mmol/L (ref 101–111)
Creatinine, Ser: 1.25 mg/dL — ABNORMAL HIGH (ref 0.61–1.24)
GFR calc Af Amer: >60 mL/min (ref >60)
GFR calc non Af Amer: 57 mL/min — ABNORMAL LOW (ref >60)
GLUCOSE: 124 mg/dL — AB (ref 65–99)
POTASSIUM: 3.8 mmol/L (ref 3.5–5.1)
SODIUM: 136 mmol/L (ref 135–145)
Total Bilirubin: 1.5 mg/dL — ABNORMAL HIGH (ref 0.3–1.2)
Total Protein: 6.8 g/dL (ref 6.5–8.1)

## 2015-07-13 LAB — TSH: TSH: 1.492 u[IU]/mL (ref 0.350–4.500)

## 2015-07-13 LAB — MAGNESIUM: MAGNESIUM: 1.8 mg/dL (ref 1.7–2.4)

## 2015-07-13 LAB — TROPONIN I
TROPONIN I: 0.03 ng/mL (ref <0.031)
Troponin I: 0.03 ng/mL (ref <0.031)
Troponin I: 0.03 ng/mL (ref <0.031)

## 2015-07-13 MED ORDER — MAGNESIUM SULFATE 2 GM/50ML IV SOLN
2.0000 g | Freq: Once | INTRAVENOUS | Status: AC
  Administered 2015-07-13: 2 g via INTRAVENOUS
  Filled 2015-07-13: qty 50

## 2015-07-13 MED ORDER — POTASSIUM CHLORIDE CRYS ER 20 MEQ PO TBCR
40.0000 meq | EXTENDED_RELEASE_TABLET | Freq: Once | ORAL | Status: AC
  Administered 2015-07-13: 40 meq via ORAL
  Filled 2015-07-13: qty 2

## 2015-07-13 NOTE — Consult Note (Addendum)
ELECTROPHYSIOLOGY CONSULT NOTE    Patient ID: Lonnie [name] MRN: [id number], DOB/AGE: 1945-12-19 69 y.o.  Admit date: 07/12/2015 Date of Consult:07/13/2015  Primary Physician: Cassell Smiles., MD Primary Cardiologist: Hochrein Electrophysiologist: Treavor Blomquist  Reason for Consultation: VF   HPI:  Lonnie [name] is a 69 y.o. male with a past medical history significant for CAD s/p CABG, MR s/p MVR, VF arrest s/p STJ ICD 11/2013, previous atrial fibrillation/atrial tach (no atrial arrhythmias since 06/2014), ICM, and hypertension. He has had appropriate ICD therapy in June of 2015 but has done well without recurrent arrhythmias until this admission.  He was at work when he had sudden loss of consciousness without prodrome and hit his head when he fell.  He was brought to the ER where device interrogation demonstrated VF preceded by frequent ventricular ectopy terminated by HV shock. He reports that he has been out of Bisoprolol for the last few days.  He denies chest pain, shortness of breath, recent fever, chills, nausea or vomiting. He has not had palpitations or dizziness.   Past Medical History  Diagnosis Date  . High cholesterol   . Hypertension   . Constipation   . Reflux   . Complication of anesthesia     "Hard time waking me up" after sedation after dental procedure  . CAD (coronary artery disease)     LAD 95% proximal stenosis, D1 50-60% stenosis, the circumflex 40% stenosis, RCA subtotal stenosis.  . Cardiomyopathy, ischemic     EF was 30-35% by echo but 45-50% by cath.  Most recent EF 15%.    . Mitral regurgitation     Secondary to papillary muscle rupture  . H/O cardiac arrest   . Atrial tachycardia (HCC)   . Ventricular fibrillation (HCC) 02/16/14    successfully defibrillation by his ICD     Surgical History:  Past Surgical History  Procedure Laterality Date  . Stomach ulcer repair    . Tee without cardioversion N/A 02/04/2013    Procedure: TRANSESOPHAGEAL  ECHOCARDIOGRAM (TEE);  Surgeon: Vesta Mixer, MD;  Location: [facility] ENDOSCOPY;  Service: Cardiovascular;  Laterality: N/A;  . Intraoperative transesophageal echocardiogram N/A 02/05/2013    Procedure: INTRAOPERATIVE TRANSESOPHAGEAL ECHOCARDIOGRAM;  Surgeon: [name], MD;  Location: [facility] OR;  Service: Open Heart Surgery;  Laterality: N/A;  . Endovein harvest of greater saphenous vein Right 02/05/2013    Procedure: ENDOVEIN HARVEST OF GREATER SAPHENOUS VEIN;  Surgeon: [name], MD;  Location: [facility] OR;  Service: Open Heart Surgery;  Laterality: Right;  . Patent foramen ovale closure N/A 02/05/2013    Procedure: PATENT FORAMEN OVALE CLOSURE;  Surgeon: [name], MD;  Location: [facility] OR;  Service: Open Heart Surgery;  Laterality: N/A;  . Mitral valve replacement (mvr)/coronary artery bypass grafting (cabg) N/A 02/05/2013    Procedure: MITRAL VALVE REPLACEMENT (MVR)/CORONARY ARTERY BYPASS GRAFTING (CABG);  Surgeon: [name], MD;  Location: [facility] OR;  Service: Open Heart Surgery;  Laterality: N/A;  x3 using right greater saphenous vein and left internal mammary.   . Coronary artery bypass graft    . Implantable cardioverter defibrillator implant  12/03/2013    STJ Fortify ICD implanted for secondary prevention by Dr Johney Frame  . Cardioversion N/A 01/25/2014    Procedure: CARDIOVERSION;  Surgeon: Wendall Stade, MD;  Location: [facility] ENDOSCOPY;  Service: Cardiovascular;  Laterality: N/A;  . Left heart catheterization with coronary angiogram N/A 02/03/2013    Procedure: LEFT HEART CATHETERIZATION WITH CORONARY ANGIOGRAM;  Surgeon: Jerolyn Center  Argentina Donovan, MD;  Location: [facility] CATH LAB;  Service: Cardiovascular;  Laterality: N/A;  . Left heart catheterization with coronary/graft angiogram N/A 11/01/2013    Procedure: LEFT HEART CATHETERIZATION WITH Isabel Caprice;  Surgeon: Peter M Swaziland, MD;  Location: [facility] CATH LAB;  Service: Cardiovascular;  Laterality: N/A;  . Implantable cardioverter  defibrillator implant N/A 12/03/2013    Procedure: IMPLANTABLE CARDIOVERTER DEFIBRILLATOR IMPLANT;  Surgeon: Gardiner Rhyme, MD;  Location: [facility] CATH LAB;  Service: Cardiovascular;  Laterality: N/A;     Prescriptions prior to admission  Medication Sig Dispense Refill Last Dose  . acetaminophen (TYLENOL) 500 MG tablet Take 500 mg by mouth every 6 (six) hours as needed for mild pain or moderate pain.    over 30 days  . aspirin EC 81 MG tablet Take 1 tablet (81 mg total) by mouth daily. 90 tablet 3 07/12/2015 at Unknown time  . atorvastatin (LIPITOR) 80 MG tablet Take 1 tablet (80 mg total) by mouth daily at 6 PM. 30 tablet 11 07/11/2015 at Unknown time  . bisoprolol (ZEBETA) 10 MG tablet TAKE ONE TABLET BY MOUTH ONCE DAILY 30 tablet 6 Past Week at 800  . furosemide (LASIX) 40 MG tablet Take 1 tablet (40 mg total) by mouth daily. 30 tablet 11 07/12/2015 at Unknown time  . hydrALAZINE (APRESOLINE) 25 MG tablet Take 1 tablet (25 mg total) by mouth every 8 (eight) hours. 90 tablet 11 07/12/2015 at am  . isosorbide mononitrate (IMDUR) 30 MG 24 hr tablet Take 1 tablet (30 mg total) by mouth daily. 30 tablet 11 07/12/2015 at Unknown time  . nitroGLYCERIN (NITROSTAT) 0.4 MG SL tablet Place 1 tablet (0.4 mg total) under the tongue every 5 (five) minutes as needed for chest pain. 25 tablet 3 never    Inpatient Medications:  . atorvastatin  80 mg Oral q1800  . bisoprolol  10 mg Oral Daily  . furosemide  40 mg Oral Daily  . hydrALAZINE  25 mg Oral 3 times per day  . isosorbide mononitrate  30 mg Oral Daily  . levETIRAcetam  500 mg Oral BID  . sodium chloride  3 mL Intravenous Q12H    Allergies: No Known Allergies  Social History   Social History  . Marital Status: Married    Spouse Name: N/A  . Number of Children: N/A  . Years of Education: N/A   Occupational History  . Not on file.   Social History Main Topics  . Smoking status: Former Smoker    Types: Cigarettes    Quit date: 06/04/1974  .  Smokeless tobacco: Not on file     Comment: 40 yrs ago  . Alcohol Use: 0.0 oz/week    0 Standard drinks or equivalent per week     Comment: week end - 1/5 of crown- 9/25 ocassionally-AJ  . Drug Use: No  . Sexual Activity: Not on file   Other Topics Concern  . Not on file   Social History Narrative   Drinks about five cups of caffeine a day.     Family History  Problem Relation Age of Onset  . Heart attack Mother 81     Review of Systems: All other systems reviewed and are otherwise negative except as noted above.  Physical Exam: Filed Vitals:   07/12/15 1930 07/12/15 2056 07/13/15 0510 07/13/15 0626  BP:  146/82 110/73 112/60  Pulse:  82 67   Temp: 98.6 F (37 C) 98.4 F (36.9 C) 99.6 F (37.6 C)  TempSrc:  Oral Oral   Resp:  18 18   Height:  6\' 1"  (1.854 m)    Weight:  226 lb 6.4 oz (102.694 kg) 235 lb 0.2 oz (106.6 kg)   SpO2:  98% 96%     GEN- The patient is well appearing, alert and oriented x 3 today.   HEENT: normocephalic, R periorbital hematoma; sclera clear, conjunctiva pink; hearing intact; oropharynx clear; neck supple  Lungs- Clear to ausculation bilaterally, normal work of breathing.  No wheezes, rales, rhonchi Heart- Regular rate and rhythm, 1/6 SEM GI- soft, non-tender, non-distended, bowel sounds present  Extremities- no clubbing, cyanosis, or edema; DP/PT/radial pulses 2+ bilaterally MS- no significant deformity or atrophy Skin- warm and dry, no rash or lesion Psych- euthymic mood, full affect Neuro- strength and sensation are intact  Labs:   Lab Results  Component Value Date   WBC 14.6* 07/13/2015   HGB 11.3* 07/13/2015   HCT 34.4* 07/13/2015   MCV 88.9 07/13/2015   PLT 147* 07/13/2015     Recent Labs Lab 07/13/15 0336  NA 136  K 3.8  CL 104  CO2 23  BUN 15  CREATININE 1.25*  CALCIUM 8.9  PROT 6.8  BILITOT 1.5*  ALKPHOS 94  ALT 27  AST 36  GLUCOSE 124*      Radiology/Studies: Dg Chest 2 View 07/12/2015  CLINICAL DATA:   Syncope EXAM: CHEST  2 VIEW COMPARISON:  12/04/2013 FINDINGS: Cardiomegaly with vascular congestion. Interstitial prominence within the lungs could reflect early interstitial edema. Prior CABG. Left pacer is unchanged. No effusions. No acute bony abnormality. IMPRESSION: Cardiomegaly with vascular congestion and possible early interstitial edema. Electronically Signed   By: Charlett NoseKevin  Dover M.D.   On: 07/12/2015 16:14   Ct Head Wo Contrast 07/13/2015  CLINICAL DATA:  Follow-up intracranial hemorrhage EXAM: CT HEAD WITHOUT CONTRAST TECHNIQUE: Contiguous axial images were obtained from the base of the skull through the vertex without intravenous contrast. COMPARISON:  07/12/2015 FINDINGS: Extensive subarachnoid blood persists, most prevalent in the frontal lobes bilaterally, and the right sylvian fissure. Small right subdural hematoma and right tentorial hemorrhage also persist, mildly decreased. Probable hemorrhagic contusions of the inferior frontal lobes bilaterally, right greater than left.No new or enlarging intracranial hemorrhage. Unchanged encephalomalacia due to remote left occipital infarction. Unchanged right frontal calvarial fracture. IMPRESSION: Mildly reduced volume of subdural blood on the right. Persistent subarachnoid hemorrhage in the frontal lobes and right sylvian fissure. Persistent right tentorial hemorrhage. Bilateral inferior frontal hemorrhagic contusions. There is unchanged sulcal effacement, right greater than left. Basal cisterns remain patent. Electronically Signed   By: Ellery Plunkaniel R Mitchell M.D.   On: 07/13/2015 04:29   EKG:SR, V pacing, rate 84  TELEMETRY: AV pacing  DEVICE HISTORY: STJ dual chamber ICD implanted 11/2013 for secondary prevention  Assessment/Plan: 1.  VF The patient has had recurrent VF in the setting of missing several days of BB Would resume home medications - compliance encouraged No driving x6 months (pt aware) Keep K >3.9, Mg >1.8 This is first episode of  ventricular arrhythmia since 02/2014 - would not add AAD therapy at this time.  If he has recurrent VF with medical compliance, will need to consider.   2.  CAD/ICM No recent ischemic symptoms Continue medical therapy  Troponin normal Resume ASA when ok with neurosurgery  3.  Atrial fibrillation/atrial tachycardia No recent atrial arrhythmias (AMS episodes recorded in device are not true AF) If he has recurrent sustained atrial arrhythmias, will need resumption of anticoagulation  4.  ICD on manufacturer recall His ICD is on manufacturer recall for premature battery depletion With appropriate therapy, will need to discuss plan with Dr Johney Frame   Signed, Gypsy Balsam, NP 07/13/2015 10:11 AM   I have seen, examined the patient, and reviewed the above assessment and plan. On exam, RRR. Changes to above are made where necessary.  Importance of compliance with beta blocker therapy was stressed today.  I think for now, we should resume home medicines and follow.  We discussed premature battery depletion that has been observed rarely with [facility] ICDs.  He is clear that he would prefer to monitor this closely with remote monitoring and avoid device replacement if possible.  This is currently the recommendation from [facility].  No driving x 6 months (pt aware).  No further inpatient EP workup planned. Electrophysiology team to see as needed while here. Please call with questions.   Co Sign: Hillis Range, MD 07/13/2015 11:27 AM

## 2015-07-13 NOTE — Progress Notes (Signed)
PATIENT DETAILS Name: Lonnie [name] Age: 69 y.o. Sex: male Date of Birth: Jul 20, 1946 Admit Date: 07/12/2015 Admitting Physician Eduard Clos, MD RUE:AVWUJ,WJXBJYNW J., MD  Subjective: No complaints this morning-has no recollection of syncopal episode.  Assessment/Plan: Principal Problem: Subdural hematoma/subarachnoid hemorrhage/hemorrhagic contusion right frontal lobe: Secondary to trauma, neurosurgery following, repeat CT scan on 11/2 shows stable changes. Continue Keppra as recommended by neurosurgery-Await further recommendations from neurosurgery. Await antiplatelets and anticoagulants.  Active Problems: Ventricular fibrillation with syncope: Status post ICD shock. Likely in the setting of missing several days of beta blockers. Compliance encouraged. Cardiology following. Supplement magnesium and potassium,recheck electrolytes in a.m.  CAD status post CABG and mitral valve replacement with bioprosthetic valve: Stable, without chest pain or shortness of breath. Aspirin discontinued because of above. Continue statin and beta blocker.  Chronic systolic heart failure (EF 20-25% on 11/19/14): Clinically compensated-continue Lasix, beta blocker-reviewed cardiology consultation note-apparently not on ace/ARB due to renal insufficiency. AICD in place.  CKD stage III: Creatinine close to her baseline. Monitor periodically.  Hypertension: Controlled-continue bisoprolol, hydralazine and Imdur. Follow with adjust accordingly.   Mild leukocytosis: UA/chest x-ray-negative for PNA/UTI. Afebrile-suspect secondary to stress the margination. Monitor off antibiotics  Nondisplaced right frontal skull fracture/orbital fracture: Supportive care-second to above,-neurosurgery following  History of paroxysmal atrial fibrillation: Reviewed cardiology/EP notes-apparently no recent atrial arrhythmias-currently not a candidate for anticoagulation.  Disposition: Remain  inpatient-home on 11/3 if no further workup required  Antimicrobial agents  See below  Anti-infectives    None      DVT Prophylaxis:  SCD's  Code Status: Full code   Family Communication None at bedside  Procedures: None  CONSULTS:  cardiology and Neurosurgery  Time spent 30 minutes-Greater than 50% of this time was spent in counseling, explanation of diagnosis, planning of further management, and coordination of care.  MEDICATIONS: Scheduled Meds: . atorvastatin  80 mg Oral q1800  . bisoprolol  10 mg Oral Daily  . furosemide  40 mg Oral Daily  . hydrALAZINE  25 mg Oral 3 times per day  . isosorbide mononitrate  30 mg Oral Daily  . levETIRAcetam  500 mg Oral BID  . sodium chloride  3 mL Intravenous Q12H   Continuous Infusions:  PRN Meds:.acetaminophen **OR** acetaminophen, hydrALAZINE, nitroGLYCERIN, ondansetron **OR** ondansetron (ZOFRAN) IV    PHYSICAL EXAM: Vital signs in last 24 hours: Filed Vitals:   07/12/15 1930 07/12/15 2056 07/13/15 0510 07/13/15 0626  BP:  146/82 110/73 112/60  Pulse:  82 67   Temp: 98.6 F (37 C) 98.4 F (36.9 C) 99.6 F (37.6 C)   TempSrc:  Oral Oral   Resp:  18 18   Height:  6\' 1"  (1.854 m)    Weight:  102.694 kg (226 lb 6.4 oz) 106.6 kg (235 lb 0.2 oz)   SpO2:  98% 96%     Weight change:  Filed Weights   07/12/15 2056 07/13/15 0510  Weight: 102.694 kg (226 lb 6.4 oz) 106.6 kg (235 lb 0.2 oz)   Body mass index is 31.01 kg/(m^2).   Gen Exam: Awake and alert with clear speech. Right periorbital hematoma Neck: Supple, No JVD.   Chest: B/L Clear.   CVS: S1 S2 Regular, systolic murmur Abdomen: soft, BS +, non tender, non distended.  Extremities: no edema, lower extremities warm to touch. Neurologic: Non Focal.   Skin: No Rash.   Wounds: N/A.  Intake/Output from previous  day:  Intake/Output Summary (Last 24 hours) at 07/13/15 1033 Last data filed at 07/13/15 0841  Gross per 24 hour  Intake    200 ml  Output     200 ml  Net      0 ml     LAB RESULTS: CBC  Recent Labs Lab 07/12/15 1532 07/13/15 0729  WBC 9.8 14.6*  HGB 11.5* 11.3*  HCT 35.2* 34.4*  PLT 160 147*  MCV 87.8 88.9  MCH 28.7 29.2  MCHC 32.7 32.8  RDW 14.4 14.9  LYMPHSABS 1.7 1.9  MONOABS 1.0 1.5*  EOSABS 0.1 0.0  BASOSABS 0.0 0.0    Chemistries   Recent Labs Lab 07/12/15 1532 07/12/15 2359 07/13/15 0336  NA 140  --  136  K 3.5  --  3.8  CL 106  --  104  CO2 28  --  23  GLUCOSE 133*  --  124*  BUN 17  --  15  CREATININE 1.53*  --  1.25*  CALCIUM 8.2*  --  8.9  MG 1.8 1.8  --     CBG:  Recent Labs Lab 07/12/15 1535  GLUCAP 125*    GFR Estimated Creatinine Clearance: 70.5 mL/min (by C-G formula based on Cr of 1.25).  Coagulation profile No results for input(s): INR, PROTIME in the last 168 hours.  Cardiac Enzymes  Recent Labs Lab 07/12/15 2359 07/13/15 0336  TROPONINI 0.03 0.03    Invalid input(s): POCBNP No results for input(s): DDIMER in the last 72 hours. No results for input(s): HGBA1C in the last 72 hours. No results for input(s): CHOL, HDL, LDLCALC, TRIG, CHOLHDL, LDLDIRECT in the last 72 hours.  Recent Labs  07/12/15 2359  TSH 1.492   No results for input(s): VITAMINB12, FOLATE, FERRITIN, TIBC, IRON, RETICCTPCT in the last 72 hours. No results for input(s): LIPASE, AMYLASE in the last 72 hours.  Urine Studies No results for input(s): UHGB, CRYS in the last 72 hours.  Invalid input(s): UACOL, UAPR, USPG, UPH, UTP, UGL, UKET, UBIL, UNIT, UROB, ULEU, UEPI, UWBC, URBC, UBAC, CAST, UCOM, BILUA  MICROBIOLOGY: No results found for this or any previous visit (from the past 240 hour(s)).  RADIOLOGY STUDIES/RESULTS: Dg Chest 2 View  07/12/2015  CLINICAL DATA:  Syncope EXAM: CHEST  2 VIEW COMPARISON:  12/04/2013 FINDINGS: Cardiomegaly with vascular congestion. Interstitial prominence within the lungs could reflect early interstitial edema. Prior CABG. Left pacer is unchanged. No  effusions. No acute bony abnormality. IMPRESSION: Cardiomegaly with vascular congestion and possible early interstitial edema. Electronically Signed   By: Charlett NoseKevin  Dover M.D.   On: 07/12/2015 16:14   Ct Head Wo Contrast  07/13/2015  CLINICAL DATA:  Follow-up intracranial hemorrhage EXAM: CT HEAD WITHOUT CONTRAST TECHNIQUE: Contiguous axial images were obtained from the base of the skull through the vertex without intravenous contrast. COMPARISON:  07/12/2015 FINDINGS: Extensive subarachnoid blood persists, most prevalent in the frontal lobes bilaterally, and the right sylvian fissure. Small right subdural hematoma and right tentorial hemorrhage also persist, mildly decreased. Probable hemorrhagic contusions of the inferior frontal lobes bilaterally, right greater than left.No new or enlarging intracranial hemorrhage. Unchanged encephalomalacia due to remote left occipital infarction. Unchanged right frontal calvarial fracture. IMPRESSION: Mildly reduced volume of subdural blood on the right. Persistent subarachnoid hemorrhage in the frontal lobes and right sylvian fissure. Persistent right tentorial hemorrhage. Bilateral inferior frontal hemorrhagic contusions. There is unchanged sulcal effacement, right greater than left. Basal cisterns remain patent. Electronically Signed   By: Ellery Plunkaniel R Mitchell  M.D.   On: 07/13/2015 04:29   Ct Head Wo Contrast  07/12/2015  CLINICAL DATA:  Syncopal episode.  Fell.  Hit head. EXAM: CT HEAD WITHOUT CONTRAST CT MAXILLOFACIAL WITHOUT CONTRAST CT CERVICAL SPINE WITHOUT CONTRAST TECHNIQUE: Multidetector CT imaging of the head, cervical spine, and maxillofacial structures were performed using the standard protocol without intravenous contrast. Multiplanar CT image reconstructions of the cervical spine and maxillofacial structures were also generated. COMPARISON:  Head CT 11/01/2013 FINDINGS: CT HEAD FINDINGS There is a small subdural hematoma in the right frontoparietal region with  mild sulcal effacement. There is also a fair amount of subarachnoid hemorrhage in the right frontal area and in the right sylvian fissure. There is also a subdural hematoma along the right tentorium. Subarachnoid hemorrhage is also noted in the interhemispheric fissure anteriorly. There is a hemorrhagic contusion involving the right frontal lobe in the anterior cranial fossa inferiorly. Remote left occipital infarct with encephalomalacia. The ventricles are in the midline and stable in size and configuration. The brainstem and cerebellum appear normal. There is a nondisplaced but fairly extensive right frontal bone fracture beginning at the vertex and extending down to just above the right frontal sinus. The globes are intact. CT MAXILLOFACIAL FINDINGS There is a hematoma around the right orbit. There is a nondisplaced fracture of the orbital roof best seen on the sagittal reformatted images. No other definite facial bone fractures. The paranasal sinuses and mastoid air cells are clear. CT CERVICAL SPINE FINDINGS Advanced degenerative cervical spondylosis with multilevel disc disease and facet disease. The most significant disc disease is at C5-6 an the facet disease is fairly diffuse and more significant. There are advanced degenerative changes at C1-2. The dens is intact. The cervical vertebral bodies are overall normally aligned. There is mild degenerative anterior subluxation of C7 compared to T1 and the facets are fused bilaterally at this level. The spinal canal is generous. No spinal stenosis. The sternoclavicular joints demonstrate moderate arthropathy, vertically on the right side. The lung apices are grossly clear. IMPRESSION: 1. Small right-sided subdural hematoma and along the right tentorium. There is also a fair amount of subarachnoid hemorrhage and a hemorrhagic contusion involving the right frontal lobe. The pattern of subarachnoid hemorrhage is most likely posttraumatic and less likely due to a  ruptured aneurysm. CT angiography of the brain may be helpful for further evaluation. 2. Nondisplaced right frontal skull fracture. 3. Nondisplaced orbital roof fracture. 4. Advanced degenerative cervical spondylosis but no acute cervical spine fracture. 5. Remote left sided occipital infarct with encephalomalacia. These results were called by telephone at the time of interpretation on 07/12/2015 at 5:07 pm to Dr. Crista Curb , who verbally acknowledged these results. Electronically Signed   By: Rudie Meyer M.D.   On: 07/12/2015 17:07   Ct Cervical Spine Wo Contrast  07/12/2015  CLINICAL DATA:  Syncopal episode.  Fell.  Hit head. EXAM: CT HEAD WITHOUT CONTRAST CT MAXILLOFACIAL WITHOUT CONTRAST CT CERVICAL SPINE WITHOUT CONTRAST TECHNIQUE: Multidetector CT imaging of the head, cervical spine, and maxillofacial structures were performed using the standard protocol without intravenous contrast. Multiplanar CT image reconstructions of the cervical spine and maxillofacial structures were also generated. COMPARISON:  Head CT 11/01/2013 FINDINGS: CT HEAD FINDINGS There is a small subdural hematoma in the right frontoparietal region with mild sulcal effacement. There is also a fair amount of subarachnoid hemorrhage in the right frontal area and in the right sylvian fissure. There is also a subdural hematoma along the right tentorium. Subarachnoid hemorrhage  is also noted in the interhemispheric fissure anteriorly. There is a hemorrhagic contusion involving the right frontal lobe in the anterior cranial fossa inferiorly. Remote left occipital infarct with encephalomalacia. The ventricles are in the midline and stable in size and configuration. The brainstem and cerebellum appear normal. There is a nondisplaced but fairly extensive right frontal bone fracture beginning at the vertex and extending down to just above the right frontal sinus. The globes are intact. CT MAXILLOFACIAL FINDINGS There is a hematoma around the right  orbit. There is a nondisplaced fracture of the orbital roof best seen on the sagittal reformatted images. No other definite facial bone fractures. The paranasal sinuses and mastoid air cells are clear. CT CERVICAL SPINE FINDINGS Advanced degenerative cervical spondylosis with multilevel disc disease and facet disease. The most significant disc disease is at C5-6 an the facet disease is fairly diffuse and more significant. There are advanced degenerative changes at C1-2. The dens is intact. The cervical vertebral bodies are overall normally aligned. There is mild degenerative anterior subluxation of C7 compared to T1 and the facets are fused bilaterally at this level. The spinal canal is generous. No spinal stenosis. The sternoclavicular joints demonstrate moderate arthropathy, vertically on the right side. The lung apices are grossly clear. IMPRESSION: 1. Small right-sided subdural hematoma and along the right tentorium. There is also a fair amount of subarachnoid hemorrhage and a hemorrhagic contusion involving the right frontal lobe. The pattern of subarachnoid hemorrhage is most likely posttraumatic and less likely due to a ruptured aneurysm. CT angiography of the brain may be helpful for further evaluation. 2. Nondisplaced right frontal skull fracture. 3. Nondisplaced orbital roof fracture. 4. Advanced degenerative cervical spondylosis but no acute cervical spine fracture. 5. Remote left sided occipital infarct with encephalomalacia. These results were called by telephone at the time of interpretation on 07/12/2015 at 5:07 pm to Dr. Crista Curb , who verbally acknowledged these results. Electronically Signed   By: Rudie Meyer M.D.   On: 07/12/2015 17:07   Ct Maxillofacial Wo Cm  07/12/2015  CLINICAL DATA:  Syncopal episode.  Fell.  Hit head. EXAM: CT HEAD WITHOUT CONTRAST CT MAXILLOFACIAL WITHOUT CONTRAST CT CERVICAL SPINE WITHOUT CONTRAST TECHNIQUE: Multidetector CT imaging of the head, cervical spine, and  maxillofacial structures were performed using the standard protocol without intravenous contrast. Multiplanar CT image reconstructions of the cervical spine and maxillofacial structures were also generated. COMPARISON:  Head CT 11/01/2013 FINDINGS: CT HEAD FINDINGS There is a small subdural hematoma in the right frontoparietal region with mild sulcal effacement. There is also a fair amount of subarachnoid hemorrhage in the right frontal area and in the right sylvian fissure. There is also a subdural hematoma along the right tentorium. Subarachnoid hemorrhage is also noted in the interhemispheric fissure anteriorly. There is a hemorrhagic contusion involving the right frontal lobe in the anterior cranial fossa inferiorly. Remote left occipital infarct with encephalomalacia. The ventricles are in the midline and stable in size and configuration. The brainstem and cerebellum appear normal. There is a nondisplaced but fairly extensive right frontal bone fracture beginning at the vertex and extending down to just above the right frontal sinus. The globes are intact. CT MAXILLOFACIAL FINDINGS There is a hematoma around the right orbit. There is a nondisplaced fracture of the orbital roof best seen on the sagittal reformatted images. No other definite facial bone fractures. The paranasal sinuses and mastoid air cells are clear. CT CERVICAL SPINE FINDINGS Advanced degenerative cervical spondylosis with multilevel disc disease  and facet disease. The most significant disc disease is at C5-6 an the facet disease is fairly diffuse and more significant. There are advanced degenerative changes at C1-2. The dens is intact. The cervical vertebral bodies are overall normally aligned. There is mild degenerative anterior subluxation of C7 compared to T1 and the facets are fused bilaterally at this level. The spinal canal is generous. No spinal stenosis. The sternoclavicular joints demonstrate moderate arthropathy, vertically on the  right side. The lung apices are grossly clear. IMPRESSION: 1. Small right-sided subdural hematoma and along the right tentorium. There is also a fair amount of subarachnoid hemorrhage and a hemorrhagic contusion involving the right frontal lobe. The pattern of subarachnoid hemorrhage is most likely posttraumatic and less likely due to a ruptured aneurysm. CT angiography of the brain may be helpful for further evaluation. 2. Nondisplaced right frontal skull fracture. 3. Nondisplaced orbital roof fracture. 4. Advanced degenerative cervical spondylosis but no acute cervical spine fracture. 5. Remote left sided occipital infarct with encephalomalacia. These results were called by telephone at the time of interpretation on 07/12/2015 at 5:07 pm to Dr. Crista Curb , who verbally acknowledged these results. Electronically Signed   By: Rudie Meyer M.D.   On: 07/12/2015 17:07    Jeoffrey Massed, MD  [facility] Pager:[phone]  If 7PM-7AM, please contact night-coverage [email or web address] Password [email or web address] 07/13/2015, 10:33 AM   LOS: 1 day

## 2015-07-13 NOTE — Progress Notes (Signed)
No issues overnight. Pt reports improvement in his HA. No new N/T/W.  EXAM:  BP 109/56 mmHg  Pulse 66  Temp(Src) 98.5 F (36.9 C) (Oral)  Resp 17  Ht 6\' 1"  (1.854 m)  Wt 106.6 kg (235 lb 0.2 oz)  BMI 31.01 kg/m2  SpO2 97%  Awake, alert, oriented  Speech fluent, appropriate  CN grossly intact  5/5 BUE/BLE   IMAGING: Stable appearance of orbitofrontal contusion, SAH, and right SDH. No new findings.  IMPRESSION:  69 y.o. male s/p syncopal episode and fall with skull fx and right contusion/SDH. Remains neurologically well with stable CT.  PLAN: - Cont 7d Keppra - Can f/u with me in 3-4 weeks for clinical eval - No ASA or anticoagulants

## 2015-07-13 NOTE — Evaluation (Signed)
Physical Therapy Evaluation and Discharge Patient Details Name: Lonnie [name] MRN: [id number] DOB: 05-04-1946 Today's Date: 07/13/2015   History of Present Illness  Pt is a 69 y/o M admitted following a syncopal episode and fall at work w/ resultant subdural hematoma/subarachnoid hemorrhage/hemorrhagic contusion Rt frontal lobe.  Pt's PMH includes cardiac arrest, atrial tachycardia, v-fib, CABG.  Clinical Impression  Pt admitted with above diagnosis. Mr. Clayson is at mod I level for all mobility and will have 24/7 assist from wife if any needs arise.  No acute PT needs identified, PT is signing off.  Thank you for this order.    Follow Up Recommendations No PT follow up    Equipment Recommendations  None recommended by PT    Recommendations for Other Services       Precautions / Restrictions Restrictions Weight Bearing Restrictions: No      Mobility  Bed Mobility Overal bed mobility: Independent                Transfers Overall transfer level: Modified independent Equipment used: None             General transfer comment: Very mild instability noted but this is pt's first time up  Ambulation/Gait Ambulation/Gait assistance: Modified independent (Device/Increase time) Ambulation Distance (Feet): 250 Feet Assistive device: None Gait Pattern/deviations: Step-through pattern   Gait velocity interpretation: at or above normal speed for age/gender General Gait Details: Very mild instability noted w/ challenges to balance, see notes on DGI below.  Pt's wife reports pt is ambulating at his baseline.  Stairs Stairs: Yes Stairs assistance: Supervision Stair Management: One rail Right;Forwards;Alternating pattern Number of Stairs: 4 General stair comments: Supervision for pt's safety only  Wheelchair Mobility    Modified Rankin (Stroke Patients Only)       Balance Overall balance assessment: Modified Independent                               Standardized Balance Assessment Standardized Balance Assessment : Dynamic Gait Index   Dynamic Gait Index Level Surface: Normal Change in Gait Speed: Normal Gait with Horizontal Head Turns: Normal Gait with Vertical Head Turns: Normal Gait and Pivot Turn: Normal Step Over Obstacle: Mild Impairment Step Around Obstacles: Normal Steps: Mild Impairment (used rail) Total Score: 22       Pertinent Vitals/Pain Pain Assessment: No/denies pain    Home Living Family/patient expects to be discharged to:: Private residence Living Arrangements: Spouse/significant other Available Help at Discharge: Family;Available 24 hours/day (wife) Type of Home: House Home Access: Stairs to enter Entrance Stairs-Rails: Right Entrance Stairs-Number of Steps: 4 Home Layout: Multi-level;Able to live on main level with bedroom/bathroom (3 levels) Home Equipment: None      Prior Function Level of Independence: Independent               Hand Dominance        Extremity/Trunk Assessment   Upper Extremity Assessment: Overall WFL for tasks assessed           Lower Extremity Assessment: Overall WFL for tasks assessed (grossly 5/5 Bil LEs w/ MMT)         Communication   Communication: No difficulties  Cognition Arousal/Alertness: Awake/alert Behavior During Therapy: WFL for tasks assessed/performed Overall Cognitive Status: Within Functional Limits for tasks assessed                      General Comments General comments (  skin integrity, edema, etc.): Pt is appropraite for d/c from a mobility standpoint.    Exercises        Assessment/Plan    PT Assessment Patent does not need any further PT services  PT Diagnosis Abnormality of gait   PT Problem List    PT Treatment Interventions     PT Goals (Current goals can be found in the Care Plan section) Acute Rehab PT Goals Patient Stated Goal: to go home and get back to work PT Goal Formulation: All assessment and  education complete, DC therapy    Frequency     Barriers to discharge        Co-evaluation               End of Session Equipment Utilized During Treatment: Gait belt Activity Tolerance: Patient tolerated treatment well Patient left: in bed;with bed alarm set;with family/visitor present;Other (comment);with call bell/phone within reach (sitting EOB) Nurse Communication: Mobility status;Precautions         Time: [id number] PT Time Calculation (min) (ACUTE ONLY): 13 min   Charges:   PT Evaluation $Initial PT Evaluation Tier I: 1 Procedure     PT G Codes:       Michail JewelsAshley Parr PT, DPT [phone] Pager: [phone] 07/13/2015, 1:00 PM

## 2015-07-13 NOTE — Progress Notes (Signed)
UR Completed Camielle Sizer Graves-Bigelow, RN,BSN [phone]  

## 2015-07-14 DIAGNOSIS — I469 Cardiac arrest, cause unspecified: Secondary | ICD-10-CM

## 2015-07-14 LAB — BASIC METABOLIC PANEL
ANION GAP: 7 (ref 5–15)
BUN: 15 mg/dL (ref 6–20)
CHLORIDE: 107 mmol/L (ref 101–111)
CO2: 26 mmol/L (ref 22–32)
CREATININE: 1.28 mg/dL — AB (ref 0.61–1.24)
Calcium: 8.6 mg/dL — ABNORMAL LOW (ref 8.9–10.3)
GFR calc non Af Amer: 55 mL/min — ABNORMAL LOW (ref >60)
Glucose, Bld: 108 mg/dL — ABNORMAL HIGH (ref 65–99)
POTASSIUM: 4.6 mmol/L (ref 3.5–5.1)
SODIUM: 140 mmol/L (ref 135–145)

## 2015-07-14 LAB — MAGNESIUM: MAGNESIUM: 2.2 mg/dL (ref 1.7–2.4)

## 2015-07-14 MED ORDER — LEVETIRACETAM 500 MG PO TABS
500.0000 mg | ORAL_TABLET | Freq: Two times a day (BID) | ORAL | Status: DC
Start: 2015-07-14 — End: 2015-11-28

## 2015-07-14 NOTE — Discharge Instructions (Signed)
No aspirin or any other  blood thinner till cleared by neurosurgery.   Intracranial Hemorrhage An intracranial hemorrhage is bleeding in the layers between the skull (cranium) and brain. A blood vessel bursts and allows blood to leak inside the cranial cavity. The leaking blood then collects (hematoma). This causes pressure and damage to brain cells. The bleeding can be mild to severe. In severe cases, it can lead to permanent damage or death. Symptoms may come on suddenly or develop over time. Early diagnosis and treatment leads to better recovery.  There are four types of intracranial hemorrhage: subarachnoid, subdural, extradural, or cerebral hemorrhage. CAUSES   Head injury (trauma).  Ruptured brain aneurysm.  Bleeding from blood vessels that develop abnormally (arteriovenous malformation).  Bleeding disorder.  Use of blood thinners (anticoagulants).  Use of certain drugs, such as cocaine. For some people with intracranial hemorrhage, the cause is unknown.  RISK FACTORS  Using tobacco products, such as cigarettes and chewing tobacco.  Having high blood pressure (hypertension).  Abusing alcohol.  Being a male, especially of postmenopausal age.  Having a family history of disease in the blood vessels of the brain (cerebrovascular disease).  Having certain genetic syndromes that result in kidney disease or connective tissue disease. SIGNS AND SYMPTOMS  A sudden, severe headache with no known cause. The headache is often described as the worst headache ever experienced.  Nausea or vomiting, especially when combined with other symptoms such as a headache.  Sudden weakness or numbness of the face, arm, or leg, especially on one side of the body.  Sudden trouble walking or difficulty moving arms or legs.  Sudden confusion.  Sudden personality changes.  Trouble speaking (aphasia) or understanding.  Difficulty swallowing.  Sudden trouble seeing in one or both  eyes.  Double vision.  Dizziness.  Loss of balance or coordination.  Intolerance to light.  Stiff neck. DIAGNOSIS  Your health care provider will perform a physical exam and ask about your symptoms. If an intracranial hemorrhage is suspected, various tests may be ordered. These tests may include:   A CT scan.  An MRI.  A cerebral angiogram.  A spinal tap (lumbar puncture).  Blood tests. TREATMENT Immediate treatment in the hospital is often required to reduce the risk of brain damage. Treatment will depend on the cause of the bleeding, where it is located, and the extent of the bleeding and damage. The goals of treatment include stopping the bleeding, repairing the cause of bleeding, providing relief of symptoms, and preventing problems.   Medicines may be given to:  Lower blood pressure (antihypertensives).  Relieve pain (analgesics).  Relieve nausea or vomiting.  Surgery may be needed to stop the bleeding, repair the cause of the bleeding, or remove the blood.  Rehabilitation may be needed to improve any cognitive and day-to-day functions impaired by the condition. Further treatment depends on the duration, severity, and cause of your symptoms. Physical, speech, and occupational therapists will assess you and work to improve any functions impaired by the intracranial hemorrhage. Measures will be taken to prevent short-term and long-term problems, including infection from breathing foreign material into the lungs (aspiration pneumonia), blood clots in the legs, bedsores, and falls. HOME CARE INSTRUCTIONS  Take medicines only as directed by your health care provider.  Eat healthy foods as directed by your health care provider:  A diet low in salt (sodium), saturated fat, trans fat, and cholesterol may be recommended to manage your blood pressure.  Foods may need to be soft  or pureed, or small bites may need to be taken in order to avoid aspirating or choking.  If  studies show that your ability to swallow safely has been affected, you may need to seek help from specialists such as a dietitian, speech and language pathologist, or an occupational therapist. These health care providers can teach you how to safely get the nutrition your body needs.  Rest and limit activities or movements as directed by your health care provider.  Do not use any tobacco products including cigarettes, chewing tobacco, or electronic cigarettes. If you need help quitting, ask your health care provider.  Limit alcohol intake to no more than 1 drink per day for nonpregnant women and 2 drinks per day for men. One drink equals 12 ounces of beer, 5 ounces of wine, or 1 ounces of hard liquor.  Make any other lifestyle changes as directed by your health care provider.  Monitor and record your blood pressure as directed by your health care provider.  A safe home environment is important to reduce the risk of falls. Your health care provider may arrange for specialists to evaluate your home. Having grab bars in the bedroom and bathroom is often important. Your health care provider may arrange for special equipment to be used at home, such as raised toilets and a seat for the shower.  Do physical, occupational, and speech therapy as directed by your health care provider. Ongoing therapy may be needed to maximize your recovery.  Use a walker or a cane at all times if directed by your health care provider.  Keep all follow-up visits with your health care provider and other specialists. This is important. This includes any referrals, physical therapy, and rehabilitation. SEEK IMMEDIATE MEDICAL CARE IF:   You have a sudden, severe headache with no known cause.  You have nausea or vomiting occurring with another symptom.  You have sudden weakness or numbness of the face, arm, or leg, especially on one side of the body.  You have sudden trouble walking or difficulty moving your arms or  legs.  You have sudden confusion.  You have trouble speaking (aphasia) or understanding.  You have sudden trouble seeing in one or both eyes.  You have a sudden loss of balance or coordination.  You have a stiff neck.  You have difficulty breathing.  You have a partial or total loss of consciousness. These symptoms may represent a serious problem that is an emergency. Do not wait to see if the symptoms will go away. Get medical help right away. Call your local emergency services (911 in the [place] Do not drive yourself to the hospital.   This information is not intended to replace advice given to you by your health care provider. Make sure you discuss any questions you have with your health care provider.   Document Released: 03/24/2014 Document Reviewed: 03/24/2014 Elsevier Interactive Patient Education [employer]2016 [employer]

## 2015-07-14 NOTE — Care Management Note (Signed)
Case Management Note  Patient Details  Name: Bethann BerkshireRobert L Goldenstein MRN: [id number] Date of Birth: December 18, 1945  Subjective/Objective:   Pt admitted for fall and syncope at work.              Action/Plan: Plan is to return home. No needs from CM @ this time.    Expected Discharge Date:                  Expected Discharge Plan:  Home/Self Care  In-House Referral:  NA  Discharge planning Services  CM Consult  Post Acute Care Choice:  NA Choice offered to:  NA  DME Arranged:  N/A DME Agency:  NA  HH Arranged:  NA HH Agency:  NA  Status of Service:  Completed, signed off  Medicare Important Message Given:    Date Medicare IM Given:    Medicare IM give by:    Date Additional Medicare IM Given:    Additional Medicare Important Message give by:     If discussed at Long Length of Stay Meetings, dates discussed:    Additional Comments:  Gala LewandowskyGraves-Bigelow, Aarya Robinson Kaye, RN 07/14/2015, 9:48 AM

## 2015-07-14 NOTE — Progress Notes (Signed)
No issues overnight. No HA this am.  EXAM:  BP 125/76 mmHg  Pulse 60  Temp(Src) 98.7 F (37.1 C) (Oral)  Resp 18  Ht 6\' 1"  (1.854 m)  Wt 98.884 kg (218 lb)  BMI 28.77 kg/m2  SpO2 97%  Awake, alert, oriented  Speech fluent, appropriate  CN grossly intact  5/5 BUE/BLE   IMPRESSION:  69 y.o. male with traumatic contusions, neurologically well  PLAN: - Stable for d/c from neurosurgical standpoint. - Finish total of 7d Keppra - Can f/u with me in 3-4 weeks

## 2015-07-14 NOTE — Discharge Summary (Signed)
PATIENT DETAILS Name: Lonnie BerkshireRobert L [name] Age: 69 y.o. Sex: male Date of Birth: 1945-10-28 MRN: [id number]. Admitting Physician: Eduard ClosArshad N Kakrakandy, MD WUJ:WJXBJ,YNWGNFAOPCP:FUSCO,LAWRENCE J., MD  Admit Date: 07/12/2015 Discharge date: 07/14/2015  Recommendations for Outpatient Follow-up:  1. Finish total of 7d Keppra 2. No aspirin or anticoagulants cleared by neurosurgery  PRIMARY DISCHARGE DIAGNOSIS:  Principal Problem:   Intracranial bleed (HCC) Active Problems:   Essential hypertension, benign   High cholesterol   Ventricular tachycardia- recurrent   History of MVR (tissue valve) 5/14   Cardiac arrest- out of hospital cardiac arrest,   CAD - CABG x 3 5/14- patent grafts 11/01/13   Cardiomyopathy, ischemic- EF down to 15% by echo 11/02/13   Chronic systolic heart failure (HCC)   H/O Embolic stroke involving left posterior cerebral artery (HCC)   SDH (subdural hematoma) (HCC)      PAST MEDICAL HISTORY: Past Medical History  Diagnosis Date  . High cholesterol   . Hypertension   . Constipation   . Reflux   . Complication of anesthesia     "Hard time waking me up" after sedation after dental procedure  . CAD (coronary artery disease)     LAD 95% proximal stenosis, D1 50-60% stenosis, the circumflex 40% stenosis, RCA subtotal stenosis.  . Cardiomyopathy, ischemic     EF was 30-35% by echo but 45-50% by cath.  Most recent EF 15%.    . Mitral regurgitation     Secondary to papillary muscle rupture  . H/O cardiac arrest   . Atrial tachycardia (HCC)   . Ventricular fibrillation (HCC) 02/16/14    successfully defibrillation by his ICD    DISCHARGE MEDICATIONS: Current Discharge Medication List    START taking these medications   Details  levETIRAcetam (KEPPRA) 500 MG tablet Take 1 tablet (500 mg total) by mouth 2 (two) times daily. Qty: 9 tablet, Refills: 0      CONTINUE these medications which have NOT CHANGED   Details  acetaminophen (TYLENOL) 500 MG tablet Take 500 mg by mouth  every 6 (six) hours as needed for mild pain or moderate pain.     atorvastatin (LIPITOR) 80 MG tablet Take 1 tablet (80 mg total) by mouth daily at 6 PM. Qty: 30 tablet, Refills: 11    bisoprolol (ZEBETA) 10 MG tablet TAKE ONE TABLET BY MOUTH ONCE DAILY Qty: 30 tablet, Refills: 6    furosemide (LASIX) 40 MG tablet Take 1 tablet (40 mg total) by mouth daily. Qty: 30 tablet, Refills: 11    hydrALAZINE (APRESOLINE) 25 MG tablet Take 1 tablet (25 mg total) by mouth every 8 (eight) hours. Qty: 90 tablet, Refills: 11    isosorbide mononitrate (IMDUR) 30 MG 24 hr tablet Take 1 tablet (30 mg total) by mouth daily. Qty: 30 tablet, Refills: 11    nitroGLYCERIN (NITROSTAT) 0.4 MG SL tablet Place 1 tablet (0.4 mg total) under the tongue every 5 (five) minutes as needed for chest pain. Qty: 25 tablet, Refills: 3      STOP taking these medications     aspirin EC 81 MG tablet         ALLERGIES:  No Known Allergies  BRIEF HPI:  See H&P, Labs, Consult and Test reports for all details in brief, patient is a 69 y.o. male history of CAD status post CABG, bioprosthetic mitral valve, ischemic cardiomyopathy status post ICD placement, atrial arrhythmia who was recently discontinued off Apixaban was admitted for a syncopal episode.Defibrillator was interrogated and was found  to have ventricular tachycardia episode  CONSULTATIONS:   cardiology and Neurosurgery  PERTINENT RADIOLOGIC STUDIES: Dg Chest 2 View  07/12/2015  CLINICAL DATA:  Syncope EXAM: CHEST  2 VIEW COMPARISON:  12/04/2013 FINDINGS: Cardiomegaly with vascular congestion. Interstitial prominence within the lungs could reflect early interstitial edema. Prior CABG. Left pacer is unchanged. No effusions. No acute bony abnormality. IMPRESSION: Cardiomegaly with vascular congestion and possible early interstitial edema. Electronically Signed   By: Charlett Nose M.D.   On: 07/12/2015 16:14   Ct Head Wo Contrast  07/13/2015  CLINICAL DATA:   Follow-up intracranial hemorrhage EXAM: CT HEAD WITHOUT CONTRAST TECHNIQUE: Contiguous axial images were obtained from the base of the skull through the vertex without intravenous contrast. COMPARISON:  07/12/2015 FINDINGS: Extensive subarachnoid blood persists, most prevalent in the frontal lobes bilaterally, and the right sylvian fissure. Small right subdural hematoma and right tentorial hemorrhage also persist, mildly decreased. Probable hemorrhagic contusions of the inferior frontal lobes bilaterally, right greater than left.No new or enlarging intracranial hemorrhage. Unchanged encephalomalacia due to remote left occipital infarction. Unchanged right frontal calvarial fracture. IMPRESSION: Mildly reduced volume of subdural blood on the right. Persistent subarachnoid hemorrhage in the frontal lobes and right sylvian fissure. Persistent right tentorial hemorrhage. Bilateral inferior frontal hemorrhagic contusions. There is unchanged sulcal effacement, right greater than left. Basal cisterns remain patent. Electronically Signed   By: Ellery Plunk M.D.   On: 07/13/2015 04:29   Ct Head Wo Contrast  07/12/2015  CLINICAL DATA:  Syncopal episode.  Fell.  Hit head. EXAM: CT HEAD WITHOUT CONTRAST CT MAXILLOFACIAL WITHOUT CONTRAST CT CERVICAL SPINE WITHOUT CONTRAST TECHNIQUE: Multidetector CT imaging of the head, cervical spine, and maxillofacial structures were performed using the standard protocol without intravenous contrast. Multiplanar CT image reconstructions of the cervical spine and maxillofacial structures were also generated. COMPARISON:  Head CT 11/01/2013 FINDINGS: CT HEAD FINDINGS There is a small subdural hematoma in the right frontoparietal region with mild sulcal effacement. There is also a fair amount of subarachnoid hemorrhage in the right frontal area and in the right sylvian fissure. There is also a subdural hematoma along the right tentorium. Subarachnoid hemorrhage is also noted in the  interhemispheric fissure anteriorly. There is a hemorrhagic contusion involving the right frontal lobe in the anterior cranial fossa inferiorly. Remote left occipital infarct with encephalomalacia. The ventricles are in the midline and stable in size and configuration. The brainstem and cerebellum appear normal. There is a nondisplaced but fairly extensive right frontal bone fracture beginning at the vertex and extending down to just above the right frontal sinus. The globes are intact. CT MAXILLOFACIAL FINDINGS There is a hematoma around the right orbit. There is a nondisplaced fracture of the orbital roof best seen on the sagittal reformatted images. No other definite facial bone fractures. The paranasal sinuses and mastoid air cells are clear. CT CERVICAL SPINE FINDINGS Advanced degenerative cervical spondylosis with multilevel disc disease and facet disease. The most significant disc disease is at C5-6 an the facet disease is fairly diffuse and more significant. There are advanced degenerative changes at C1-2. The dens is intact. The cervical vertebral bodies are overall normally aligned. There is mild degenerative anterior subluxation of C7 compared to T1 and the facets are fused bilaterally at this level. The spinal canal is generous. No spinal stenosis. The sternoclavicular joints demonstrate moderate arthropathy, vertically on the right side. The lung apices are grossly clear. IMPRESSION: 1. Small right-sided subdural hematoma and along the right tentorium. There is also  a fair amount of subarachnoid hemorrhage and a hemorrhagic contusion involving the right frontal lobe. The pattern of subarachnoid hemorrhage is most likely posttraumatic and less likely due to a ruptured aneurysm. CT angiography of the brain may be helpful for further evaluation. 2. Nondisplaced right frontal skull fracture. 3. Nondisplaced orbital roof fracture. 4. Advanced degenerative cervical spondylosis but no acute cervical spine  fracture. 5. Remote left sided occipital infarct with encephalomalacia. These results were called by telephone at the time of interpretation on 07/12/2015 at 5:07 pm to Dr. Crista Curb , who verbally acknowledged these results. Electronically Signed   By: Rudie Meyer M.D.   On: 07/12/2015 17:07   Ct Cervical Spine Wo Contrast  07/12/2015  CLINICAL DATA:  Syncopal episode.  Fell.  Hit head. EXAM: CT HEAD WITHOUT CONTRAST CT MAXILLOFACIAL WITHOUT CONTRAST CT CERVICAL SPINE WITHOUT CONTRAST TECHNIQUE: Multidetector CT imaging of the head, cervical spine, and maxillofacial structures were performed using the standard protocol without intravenous contrast. Multiplanar CT image reconstructions of the cervical spine and maxillofacial structures were also generated. COMPARISON:  Head CT 11/01/2013 FINDINGS: CT HEAD FINDINGS There is a small subdural hematoma in the right frontoparietal region with mild sulcal effacement. There is also a fair amount of subarachnoid hemorrhage in the right frontal area and in the right sylvian fissure. There is also a subdural hematoma along the right tentorium. Subarachnoid hemorrhage is also noted in the interhemispheric fissure anteriorly. There is a hemorrhagic contusion involving the right frontal lobe in the anterior cranial fossa inferiorly. Remote left occipital infarct with encephalomalacia. The ventricles are in the midline and stable in size and configuration. The brainstem and cerebellum appear normal. There is a nondisplaced but fairly extensive right frontal bone fracture beginning at the vertex and extending down to just above the right frontal sinus. The globes are intact. CT MAXILLOFACIAL FINDINGS There is a hematoma around the right orbit. There is a nondisplaced fracture of the orbital roof best seen on the sagittal reformatted images. No other definite facial bone fractures. The paranasal sinuses and mastoid air cells are clear. CT CERVICAL SPINE FINDINGS Advanced  degenerative cervical spondylosis with multilevel disc disease and facet disease. The most significant disc disease is at C5-6 an the facet disease is fairly diffuse and more significant. There are advanced degenerative changes at C1-2. The dens is intact. The cervical vertebral bodies are overall normally aligned. There is mild degenerative anterior subluxation of C7 compared to T1 and the facets are fused bilaterally at this level. The spinal canal is generous. No spinal stenosis. The sternoclavicular joints demonstrate moderate arthropathy, vertically on the right side. The lung apices are grossly clear. IMPRESSION: 1. Small right-sided subdural hematoma and along the right tentorium. There is also a fair amount of subarachnoid hemorrhage and a hemorrhagic contusion involving the right frontal lobe. The pattern of subarachnoid hemorrhage is most likely posttraumatic and less likely due to a ruptured aneurysm. CT angiography of the brain may be helpful for further evaluation. 2. Nondisplaced right frontal skull fracture. 3. Nondisplaced orbital roof fracture. 4. Advanced degenerative cervical spondylosis but no acute cervical spine fracture. 5. Remote left sided occipital infarct with encephalomalacia. These results were called by telephone at the time of interpretation on 07/12/2015 at 5:07 pm to Dr. Crista Curb , who verbally acknowledged these results. Electronically Signed   By: Rudie Meyer M.D.   On: 07/12/2015 17:07   Ct Maxillofacial Wo Cm  07/12/2015  CLINICAL DATA:  Syncopal episode.  Fell.  Hit head. EXAM: CT HEAD WITHOUT CONTRAST CT MAXILLOFACIAL WITHOUT CONTRAST CT CERVICAL SPINE WITHOUT CONTRAST TECHNIQUE: Multidetector CT imaging of the head, cervical spine, and maxillofacial structures were performed using the standard protocol without intravenous contrast. Multiplanar CT image reconstructions of the cervical spine and maxillofacial structures were also generated. COMPARISON:  Head CT 11/01/2013  FINDINGS: CT HEAD FINDINGS There is a small subdural hematoma in the right frontoparietal region with mild sulcal effacement. There is also a fair amount of subarachnoid hemorrhage in the right frontal area and in the right sylvian fissure. There is also a subdural hematoma along the right tentorium. Subarachnoid hemorrhage is also noted in the interhemispheric fissure anteriorly. There is a hemorrhagic contusion involving the right frontal lobe in the anterior cranial fossa inferiorly. Remote left occipital infarct with encephalomalacia. The ventricles are in the midline and stable in size and configuration. The brainstem and cerebellum appear normal. There is a nondisplaced but fairly extensive right frontal bone fracture beginning at the vertex and extending down to just above the right frontal sinus. The globes are intact. CT MAXILLOFACIAL FINDINGS There is a hematoma around the right orbit. There is a nondisplaced fracture of the orbital roof best seen on the sagittal reformatted images. No other definite facial bone fractures. The paranasal sinuses and mastoid air cells are clear. CT CERVICAL SPINE FINDINGS Advanced degenerative cervical spondylosis with multilevel disc disease and facet disease. The most significant disc disease is at C5-6 an the facet disease is fairly diffuse and more significant. There are advanced degenerative changes at C1-2. The dens is intact. The cervical vertebral bodies are overall normally aligned. There is mild degenerative anterior subluxation of C7 compared to T1 and the facets are fused bilaterally at this level. The spinal canal is generous. No spinal stenosis. The sternoclavicular joints demonstrate moderate arthropathy, vertically on the right side. The lung apices are grossly clear. IMPRESSION: 1. Small right-sided subdural hematoma and along the right tentorium. There is also a fair amount of subarachnoid hemorrhage and a hemorrhagic contusion involving the right frontal  lobe. The pattern of subarachnoid hemorrhage is most likely posttraumatic and less likely due to a ruptured aneurysm. CT angiography of the brain may be helpful for further evaluation. 2. Nondisplaced right frontal skull fracture. 3. Nondisplaced orbital roof fracture. 4. Advanced degenerative cervical spondylosis but no acute cervical spine fracture. 5. Remote left sided occipital infarct with encephalomalacia. These results were called by telephone at the time of interpretation on 07/12/2015 at 5:07 pm to Dr. Crista Curb , who verbally acknowledged these results. Electronically Signed   By: Rudie Meyer M.D.   On: 07/12/2015 17:07     PERTINENT LAB RESULTS: CBC:  Recent Labs  07/12/15 1532 07/13/15 0729  WBC 9.8 14.6*  HGB 11.5* 11.3*  HCT 35.2* 34.4*  PLT 160 147*   CMET CMP     Component Value Date/Time   NA 140 07/14/2015 0420   K 4.6 07/14/2015 0420   CL 107 07/14/2015 0420   CO2 26 07/14/2015 0420   GLUCOSE 108* 07/14/2015 0420   BUN 15 07/14/2015 0420   CREATININE 1.28* 07/14/2015 0420   CREATININE 1.30 11/12/2014 1201   CALCIUM 8.6* 07/14/2015 0420   PROT 6.8 07/13/2015 0336   ALBUMIN 3.5 07/13/2015 0336   AST 36 07/13/2015 0336   ALT 27 07/13/2015 0336   ALKPHOS 94 07/13/2015 0336   BILITOT 1.5* 07/13/2015 0336   GFRNONAA 55* 07/14/2015 0420   GFRAA >60 07/14/2015 0420  GFR Estimated Creatinine Clearance: 66.5 mL/min (by C-G formula based on Cr of 1.28). No results for input(s): LIPASE, AMYLASE in the last 72 hours.  Recent Labs  07/12/15 2359 07/13/15 0336 07/13/15 1113  TROPONINI 0.03 0.03 0.03   Invalid input(s): POCBNP No results for input(s): DDIMER in the last 72 hours. No results for input(s): HGBA1C in the last 72 hours. No results for input(s): CHOL, HDL, LDLCALC, TRIG, CHOLHDL, LDLDIRECT in the last 72 hours.  Recent Labs  07/12/15 2359  TSH 1.492   No results for input(s): VITAMINB12, FOLATE, FERRITIN, TIBC, IRON, RETICCTPCT in the last  72 hours. Coags: No results for input(s): INR in the last 72 hours.  Invalid input(s): PT Microbiology: No results found for this or any previous visit (from the past 240 hour(s)).   BRIEF HOSPITAL COURSE:  Subdural hematoma/subarachnoid hemorrhage/hemorrhagic contusion right frontal lobe: Secondary to trauma, neurosurgery following, repeat CT scan on 11/2 shows stable changes. Continue Keppra as recommended by neurosurgery-No further recommendations from neurosurgery-apart from outpatient follow up. Avoid antiplatelets and anticoagulants till cleared by Neurosurgery for such agents-patient instructed multiple times.   Active Problems: Ventricular fibrillation with syncope: Status post ICD shock. Likely in the setting of missing several days of beta blockers. Compliance encouraged. No further recommendations from Cardiology.  CAD status post CABG and mitral valve replacement with bioprosthetic valve: Stable, without chest pain or shortness of breath. Aspirin discontinued because of above. Continue statin and beta blocker.  Chronic systolic heart failure (EF 20-25% on 11/19/14): Clinically compensated-continue Lasix, beta blocker-reviewed cardiology consultation note-apparently not on ace/ARB due to renal insufficiency. AICD in place.  CKD stage III: Creatinine close to baseline. Monitor periodically.  Hypertension: Controlled-continue bisoprolol, hydralazine and Imdur. Follow with adjust accordingly.   Mild leukocytosis: UA/chest x-ray-negative for PNA/UTI. Afebrile-suspect secondary to stress the margination. Monitored off antibiotics  Nondisplaced right frontal skull fracture/orbital fracture: Supportive care-second to above,neurosurgery following  History of paroxysmal atrial fibrillation: Reviewed cardiology/EP notes-apparently no recent atrial arrhythmias-currently not a candidate for anticoagulation.Defer further to the outpatient setting.  TODAY-DAY OF DISCHARGE:  Subjective:    Lonnie [name] today has no headache,no chest abdominal pain,no new weakness tingling or numbness, feels much better wants to go home today.   Objective:   Blood pressure 125/76, pulse 60, temperature 98.7 F (37.1 C), temperature source Oral, resp. rate 18, height 6\' 1"  (1.854 m), weight 98.884 kg (218 lb), SpO2 97 %.  Intake/Output Summary (Last 24 hours) at 07/14/15 1050 Last data filed at 07/14/15 0900  Gross per 24 hour  Intake    860 ml  Output    300 ml  Net    560 ml   Filed Weights   07/12/15 2056 07/13/15 0510 07/14/15 0521  Weight: 102.694 kg (226 lb 6.4 oz) 106.6 kg (235 lb 0.2 oz) 98.884 kg (218 lb)    Exam Awake Alert, Oriented *3, No new F.N deficits, Normal affect [place].AT,PERRAL Supple Neck,No JVD, No cervical lymphadenopathy appriciated.  Symmetrical Chest wall movement, Good air movement bilaterally, CTAB RRR,No Gallops,Rubs or new Murmurs, No Parasternal Heave +ve B.Sounds, Abd Soft, Non tender, No organomegaly appriciated, No rebound -guarding or rigidity. No Cyanosis, Clubbing or edema, No new Rash or bruise  DISCHARGE CONDITION: Stable  DISPOSITION: Home  DISCHARGE INSTRUCTIONS:    Activity:  As tolerated   Get Medicines reviewed and adjusted: Please take all your medications with you for your next visit with your Primary MD  Please request your Primary MD to go over all  hospital tests and procedure/radiological results at the follow up, please ask your Primary MD to get all Hospital records sent to his/her office.  If you experience worsening of your admission symptoms, develop shortness of breath, life threatening emergency, suicidal or homicidal thoughts you must seek medical attention immediately by calling 911 or calling your MD immediately  if symptoms less severe.  You must read complete instructions/literature along with all the possible adverse reactions/side effects for all the Medicines you take and that have been prescribed to you. Take  any new Medicines after you have completely understood and accpet all the possible adverse reactions/side effects.   Do not drive when taking Pain medications.   Do not take more than prescribed Pain, Sleep and Anxiety Medications  Special Instructions: If you have smoked or chewed Tobacco  in the last 2 yrs please stop smoking, stop any regular Alcohol  and or any Recreational drug use.  Wear Seat belts while driving.  Please note  You were cared for by a hospitalist during your hospital stay. Once you are discharged, your primary care physician will handle any further medical issues. Please note that NO REFILLS for any discharge medications will be authorized once you are discharged, as it is imperative that you return to your primary care physician (or establish a relationship with a primary care physician if you do not have one) for your aftercare needs so that they can reassess your need for medications and monitor your lab values.   Diet recommendation: Heart Healthy diet   Discharge Instructions    Call MD for:  extreme fatigue    Complete by:  As directed      Call MD for:  persistant dizziness or light-headedness    Complete by:  As directed      Call MD for:    Complete by:  As directed   syncope     Diet - low sodium heart healthy    Complete by:  As directed      Discharge instructions    Complete by:  As directed   No aspirin or any other blood thinner till cleared by neurosurgery.     Driving Restrictions    Complete by:  As directed   No driving or flying an airplane. No operating heavy machinery, participating in activities at height x 6 months     Increase activity slowly    Complete by:  As directed            Follow-up Information    Follow up with Cassell Smiles., MD. Schedule an appointment as soon as possible for a visit in 1 week.   Specialty:  Internal Medicine   Contact information:   [address] [place] [address] [phone]        Follow up with Hillis Range, MD. Schedule an appointment as soon as possible for a visit in 2 weeks.   Specialty:  Cardiology   Contact information:   [address] [place] [address] [phone]       Follow up with Lisbeth Renshaw, C, MD. Schedule an appointment as soon as possible for a visit in 2 weeks.   Specialty:  Neurosurgery   Contact information:   [address] [place] [address] [phone]      Total Time spent on discharge equals  45 minutes.  SignedJeoffrey Massed 07/14/2015 10:50 AM

## 2015-07-15 ENCOUNTER — Encounter: Payer: Managed Care, Other (non HMO) | Admitting: Internal Medicine

## 2015-07-20 ENCOUNTER — Telehealth: Payer: Self-pay | Admitting: Cardiology

## 2015-07-20 NOTE — Telephone Encounter (Signed)
Attempted to confirm remote transmission with pt. No answer and was unable to leave a message.   

## 2015-08-01 NOTE — Progress Notes (Signed)
Cardiology Office Note Date:  08/02/2015  Patient ID:  Lonnie [name], Lonnie [name] [birth year], MRN [id number] PCP:  Cassell Smiles., MD  Cardiologist:  Dr. Antoine Poche Electrophysiologist: Dr. Johney Frame    Chief Complaint: syncope, VF arrest, post hospital f/u  History of Present Illness: Lonnie [name] is a 69 y.o. male with history of HTN, hyperlipidemia, CAD, ICM, VHD, MR s/p MVR (bioprosthetic), Atach, VF/cardiac arrest March 2015  07/14/15 discharged from hospital after a syncopal event with head trauma resulting in R periorbital heamtoma, both SDH and SAH.  His device was interrogated and showed a VF event proceeded by ventricular ectopy and terminated by HV shock, he had been out of his bisoprolol a few days prior to the event. He had no pre-syncopal prodrome or warning.  He had  VF event with syncope previously 02/16/14 successfully defibrillated. He had a VF/VT arrest 2Feb 2015 that prompted a life vest post CABG/MVR and then ICD implant March 2015.  Today the patient feels well, he has had no symptoms, no CP, palpitations or SOB, no dizziness, near syncope or syncope, no shocks from his device.  He has seen Dr. Sharion Dove who he states cleared to resume ASA and return to work.  He assures me he is taking his medicines without missing any doses   Past Medical History  Diagnosis Date  . High cholesterol   . Hypertension   . Constipation   . Reflux   . Complication of anesthesia     "Hard time waking me up" after sedation after dental procedure  . CAD (coronary artery disease)     LAD 95% proximal stenosis, D1 50-60% stenosis, the circumflex 40% stenosis, RCA subtotal stenosis.  . Cardiomyopathy, ischemic     EF was 30-35% by echo but 45-50% by cath.  Most recent EF 15%.    . Mitral regurgitation     Secondary to papillary muscle rupture  . H/O cardiac arrest   . Atrial tachycardia (HCC)   . Ventricular fibrillation (HCC) 02/16/14    successfully defibrillation by his ICD    Past  Surgical History  Procedure Laterality Date  . Stomach ulcer repair    . Tee without cardioversion N/A 02/04/2013    Procedure: TRANSESOPHAGEAL ECHOCARDIOGRAM (TEE);  Surgeon: Vesta Mixer, MD;  Location: [facility] ENDOSCOPY;  Service: Cardiovascular;  Laterality: N/A;  . Intraoperative transesophageal echocardiogram N/A 02/05/2013    Procedure: INTRAOPERATIVE TRANSESOPHAGEAL ECHOCARDIOGRAM;  Surgeon: [name], MD;  Location: [facility] OR;  Service: Open Heart Surgery;  Laterality: N/A;  . Endovein harvest of greater saphenous vein Right 02/05/2013    Procedure: ENDOVEIN HARVEST OF GREATER SAPHENOUS VEIN;  Surgeon: [name], MD;  Location: [facility] OR;  Service: Open Heart Surgery;  Laterality: Right;  . Patent foramen ovale closure N/A 02/05/2013    Procedure: PATENT FORAMEN OVALE CLOSURE;  Surgeon: [name], MD;  Location: [facility] OR;  Service: Open Heart Surgery;  Laterality: N/A;  . Mitral valve replacement (mvr)/coronary artery bypass grafting (cabg) N/A 02/05/2013    Procedure: MITRAL VALVE REPLACEMENT (MVR)/CORONARY ARTERY BYPASS GRAFTING (CABG);  Surgeon: [name], MD;  Location: [facility] OR;  Service: Open Heart Surgery;  Laterality: N/A;  x3 using right greater saphenous vein and left internal mammary.   . Coronary artery bypass graft    . Implantable cardioverter defibrillator implant  12/03/2013    STJ Fortify ICD implanted for secondary prevention by Dr Johney Frame  . Cardioversion N/A 01/25/2014    Procedure: CARDIOVERSION;  Surgeon: Wendall Stade, MD;  Location: [facility] ENDOSCOPY;  Service: Cardiovascular;  Laterality: N/A;  . Left heart catheterization with coronary angiogram N/A 02/03/2013    Procedure: LEFT HEART CATHETERIZATION WITH CORONARY ANGIOGRAM;  Surgeon: Iran Ouch, MD;  Location: [facility] CATH LAB;  Service: Cardiovascular;  Laterality: N/A;  . Left heart catheterization with coronary/graft angiogram N/A 11/01/2013    Procedure: LEFT HEART CATHETERIZATION WITH  Isabel Caprice;  Surgeon: Peter M Swaziland, MD;  Location: [facility] CATH LAB;  Service: Cardiovascular;  Laterality: N/A;  . Implantable cardioverter defibrillator implant N/A 12/03/2013    Procedure: IMPLANTABLE CARDIOVERTER DEFIBRILLATOR IMPLANT;  Surgeon: Gardiner Rhyme, MD;  Location: [facility] CATH LAB;  Service: Cardiovascular;  Laterality: N/A;    Current Outpatient Prescriptions  Medication Sig Dispense Refill  . acetaminophen (TYLENOL) 500 MG tablet Take 500 mg by mouth every 6 (six) hours as needed for mild pain or moderate pain.     Marland Kitchen aspirin 81 MG tablet Take 81 mg by mouth daily.    Marland Kitchen atorvastatin (LIPITOR) 80 MG tablet Take 1 tablet (80 mg total) by mouth daily at 6 PM. 30 tablet 11  . bisoprolol (ZEBETA) 10 MG tablet TAKE ONE TABLET BY MOUTH ONCE DAILY 30 tablet 6  . furosemide (LASIX) 40 MG tablet Take 1 tablet (40 mg total) by mouth daily. 30 tablet 11  . hydrALAZINE (APRESOLINE) 25 MG tablet Take 1 tablet (25 mg total) by mouth every 8 (eight) hours. 90 tablet 11  . isosorbide mononitrate (IMDUR) 30 MG 24 hr tablet Take 1 tablet (30 mg total) by mouth daily. 30 tablet 11  . levETIRAcetam (KEPPRA) 500 MG tablet Take 1 tablet (500 mg total) by mouth 2 (two) times daily. 9 tablet 0  . nitroGLYCERIN (NITROSTAT) 0.4 MG SL tablet Place 1 tablet (0.4 mg total) under the tongue every 5 (five) minutes as needed for chest pain. 25 tablet 3   No current facility-administered medications for this visit.    Allergies:   Review of patient's allergies indicates no known allergies.   Social History:  The patient  reports that he quit smoking about 41 years ago. His smoking use included Cigarettes. He does not have any smokeless tobacco history on file. He reports that he drinks alcohol. He reports that he does not use illicit drugs.   Family History:  The patient's family history includes Heart attack (age of onset: 71) in his mother.  ROS:  Please see the history of present illness.    All  other systems are reviewed and otherwise negative.   PHYSICAL EXAM:  VS:  BP 96/70 mmHg  Pulse 78  Ht  (1.854 m)  Wt 209 lb 6.4 oz (94.983 kg)  BMI 27.63 kg/m2  SpO2 97% BMI: Body mass index is 27.63 kg/(m^2).  A recheck of his BP is 110/68 Well nourished, well developed, in no acute distress HEENT: normocephalic, atraumatic Neck: no JVD, carotid bruits or masses Cardiac:  normal S1, S2; RRR; no significant murmurs, no rubs, or gallops Lungs:  clear to auscultation bilaterally, no wheezing, rhonchi or rales Abd: soft, nontender,  + BS MS: no deformity or atrophy Ext: no edema Skin: warm and dry, no rash Neuro:  No gross deficits appreciated Psych: euthymic mood, full affect  ICD site is stable, no tethering or discomfort   EKG:  Done today shows SR, V pacing DEVICE informationSTJ dual chamber ICD implanted 11/2013 for secondary prevention   Recent Labs: 07/12/2015: TSH 1.492 07/13/2015: ALT 27;  [facility] [facility] [address]Macedoniane MadisonLawanna [phone] JourneDevereux Treatment Networke Bro k<[phone] tnerHughesTraining and development officerhiatric Hospit<BCristinEXTT2. 8(U>TAG>2.8(RedmondUAna

## 2015-08-02 ENCOUNTER — Telehealth: Payer: Self-pay | Admitting: *Deleted

## 2015-08-02 ENCOUNTER — Encounter: Payer: Self-pay | Admitting: Physician Assistant

## 2015-08-02 ENCOUNTER — Ambulatory Visit (INDEPENDENT_AMBULATORY_CARE_PROVIDER_SITE_OTHER): Payer: Managed Care, Other (non HMO) | Admitting: Physician Assistant

## 2015-08-02 VITALS — BP 96/70 | HR 78 | Ht 73.0 in | Wt 209.4 lb

## 2015-08-02 DIAGNOSIS — I255 Ischemic cardiomyopathy: Secondary | ICD-10-CM

## 2015-08-02 DIAGNOSIS — I10 Essential (primary) hypertension: Secondary | ICD-10-CM

## 2015-08-02 DIAGNOSIS — I519 Heart disease, unspecified: Secondary | ICD-10-CM

## 2015-08-02 LAB — MAGNESIUM: Magnesium: 2 mg/dL (ref 1.5–2.5)

## 2015-08-02 LAB — BASIC METABOLIC PANEL
BUN: 23 mg/dL (ref 7–25)
CHLORIDE: 104 mmol/L (ref 98–110)
CO2: 27 mmol/L (ref 20–31)
CREATININE: 1.4 mg/dL — AB (ref 0.70–1.18)
Calcium: 9.2 mg/dL (ref 8.6–10.3)
GLUCOSE: 92 mg/dL (ref 65–99)
POTASSIUM: 4 mmol/L (ref 3.5–5.3)
Sodium: 142 mmol/L (ref 135–146)

## 2015-08-02 NOTE — Patient Instructions (Signed)
Medication Instructions:  Your physician recommends that you continue on your current medications as directed. Please refer to the Current Medication list given to you today.    If you need a refill on your cardiac medications before your next appointment, please call your pharmacy.  Labwork: NONE ORDER TODAY    Testing/Procedures:  NONE ORDER TODAY    Follow-Up  [facility]  April  AS SCHEDULED   Your physician wants you to follow-up in: ONE YEAR WITH  You will receive a reminder letter in the mail two months in advance. If you don't receive a letter, please call our office to schedule the follow-up appointment.  Remote monitoring is used to monitor your Pacemaker of ICD from home. This monitoring reduces the number of office visits required to check your device to one time per year. It allows us to keep an eye on the functioning of your device to ensure it is working properly. You are scheduled for a device check from home on . JANUARY   You may send your transmission at any time that day. If you have a wireless device, the transmission will be sent automatically. After your physician reviews your transmission, you will receive a postcard with your next transmission date.     Any Other Special Instructions Will Be Listed Below (If Applicable).

## 2015-08-03 ENCOUNTER — Ambulatory Visit (INDEPENDENT_AMBULATORY_CARE_PROVIDER_SITE_OTHER): Payer: Managed Care, Other (non HMO)

## 2015-08-03 DIAGNOSIS — Z95810 Presence of automatic (implantable) cardiac defibrillator: Secondary | ICD-10-CM | POA: Diagnosis not present

## 2015-08-03 DIAGNOSIS — I5022 Chronic systolic (congestive) heart failure: Secondary | ICD-10-CM

## 2015-08-03 NOTE — Progress Notes (Signed)
EPIC Encounter for ICM Monitoring  Patient Name: Lonnie BerkshireRobert L Bluestein is a 69 y.o. male Date: 08/03/2015 Primary Care Physican: Cassell SmilesFUSCO,LAWRENCE J., MD Primary Cardiologist: Hochrein Electrophysiologist: Allred Dry Weight: unknown       In the past month, have you:  1. Gained more than 2 pounds in a day or more than 5 pounds in a week? Does not weigh.  Education given on importance of weighing daily to monitor for fluid weight gain, 2-3 lbs overnight or 5 lbs in a week.   2. Had changes in your medications (with verification of current medications)? no  3. Had more shortness of breath than is usual for you? no  4. Limited your activity because of shortness of breath? no  5. Not been able to sleep because of shortness of breath? no  6. Had increased swelling in your feet or ankles? no  7. Had symptoms of dehydration (dizziness, dry mouth, increased thirst, decreased urine output) no  8. Had changes in sodium restriction? no  9. Been compliant with medication? Yes   ICM trend:   Follow-up plan: ICM clinic phone appointment on 09/07/2015.  1st ICM encounter.   Corvue daily impedance below baseline 07/13/2015 to 07/17/2015 and he denied any symptoms.  He reported he was hospitalized on 07/12/2015 for head injury due to fall.  He should be returning to work in a couple of weeks but plans to retire the first of the year.  Daily impedance above baseline 07/18/2015 to 07/30/2015 and encouraged him to increase fluid intake.  Education given general recommendation for fluid intake is 64 oz daily.  Patient reported he is feeling fine at this time.  No changes today.   Copy of note sent to patient's primary care physician, primary cardiologist, and device following physician.  Karie SodaLaurie S Clebert Wenger, RN, CCM 08/03/2015 5:49 PM

## 2015-08-08 ENCOUNTER — Telehealth: Payer: Self-pay | Admitting: Internal Medicine

## 2015-08-08 NOTE — Telephone Encounter (Signed)
Form was faxed to Dr Antoine PocheHochrein as he is his general cardiologist

## 2015-08-08 NOTE — Telephone Encounter (Signed)
New Message     Pt's wife calling stating that the pt needs a release form stating he is ok to return to work. 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 Please fax to pt's job, [employer], Attn: Vista Deckndrea Earl at Fax# [phone]. Pt's wife also states that Sue Lushndrea from pt's HR at work faxed over some forms that need to be filled out and returned to her. Wife wants to make sure we received them and find out when that can be completed. Please call back and advise.

## 2015-08-11 ENCOUNTER — Encounter: Payer: Self-pay | Admitting: *Deleted

## 2015-08-11 ENCOUNTER — Telehealth: Payer: Self-pay | Admitting: *Deleted

## 2015-08-11 NOTE — Telephone Encounter (Signed)
-----   Message from [facility] LPRenee Lynn Ursuy, [place] JerseyPA-C sent at 08/03/2015  3:15 PM EST ----- Please let the patient know his labs look stable.  Thanks

## 2015-08-11 NOTE — Telephone Encounter (Signed)
Follow up      Renee faxed a note to patients employer regarding restrictions.  (Pt fainted at work)  Sue LushAndrea in [employer] want to talk to Luster LandsbergRenee to see if she saw the letter that was sent to Dr [name] regarding this incident.  Sometimes pt works alone and if he faints again it could be hours before anyone finds him.  Please call

## 2015-08-15 NOTE — Telephone Encounter (Signed)
Paperwork was sent to Dr Antoine PocheHochrein as he is his primary Cardiologist per Dr [name].  Will forward to them.

## 2015-08-15 NOTE — Telephone Encounter (Signed)
Have spoken w/ patient at Dr. Jenene SlickerHochrein's request concerning whether pt wants to proceed w/ disability filing or return to work w/ restrictions. He is unsure and has requested to speak to Dr. Antoine PocheHochrein about this, so I am forwarding this on.  I have also called & left message for Sue Lushndrea at pt's employment requesting a call back regarding the paperwork.

## 2015-08-18 ENCOUNTER — Telehealth: Payer: Self-pay | Admitting: Internal Medicine

## 2015-08-18 NOTE — Telephone Encounter (Signed)
Walk in pt form-note left for Lonnie [name]-gave to her

## 2015-08-22 ENCOUNTER — Encounter: Payer: Self-pay | Admitting: *Deleted

## 2015-08-22 ENCOUNTER — Telehealth: Payer: Self-pay | Admitting: Internal Medicine

## 2015-08-22 NOTE — Telephone Encounter (Signed)
Papers done and letter faxed to his work.  He and his wife came and picked the papers up and are hand delivering to his office

## 2015-08-22 NOTE — Telephone Encounter (Signed)
New message      Calling to see if patient's return to work notice has been faxed or is it ready to be picked up?

## 2015-09-09 ENCOUNTER — Telehealth: Payer: Self-pay

## 2015-09-09 NOTE — Telephone Encounter (Signed)
ICM transmission received.  Attempted patient call and voice mail option is not set up.

## 2015-09-19 ENCOUNTER — Ambulatory Visit (INDEPENDENT_AMBULATORY_CARE_PROVIDER_SITE_OTHER): Payer: 59 | Admitting: *Deleted

## 2015-09-19 DIAGNOSIS — I255 Ischemic cardiomyopathy: Secondary | ICD-10-CM | POA: Diagnosis not present

## 2015-09-19 NOTE — Progress Notes (Signed)
Remote ICD transmission.   

## 2015-09-20 ENCOUNTER — Telehealth: Payer: Self-pay

## 2015-09-20 NOTE — Telephone Encounter (Signed)
ICM remote transmission received.   Attempted call to patient and person answering phone stated he was at work until 4:30pm.

## 2015-09-20 NOTE — Telephone Encounter (Signed)
Unable to reach patient for ICM follow up.  Remote was rescheduled for 09/19/2015.

## 2015-09-21 NOTE — Telephone Encounter (Signed)
error 

## 2015-09-28 NOTE — Telephone Encounter (Signed)
Unable to reach patient for remote ICM transmission follow up for 09/19/2015.   Next remote ICM transmission scheduled for 10/20/2015 and patient letter sent with new date and to call if experiencing HF symptoms.   CorVue daily impedance below reference line from 09/16/2015 to 09/19/2015 suggesting fluid retention.

## 2015-10-09 LAB — CUP PACEART REMOTE DEVICE CHECK
Battery Remaining Longevity: 62 mo
Date Time Interrogation Session: 20170129105642
HIGH POWER IMPEDANCE MEASURED VALUE: 60 Ohm
Implantable Lead Location: 753859
Lead Channel Impedance Value: 390 Ohm
Lead Channel Sensing Intrinsic Amplitude: 4.1 mV
Lead Channel Setting Pacing Amplitude: 2.5 V
Lead Channel Setting Pacing Pulse Width: 0.5 ms
MDC IDC LEAD IMPLANT DT: 20150326
MDC IDC LEAD IMPLANT DT: 20150326
MDC IDC LEAD LOCATION: 753860
MDC IDC MSMT BATTERY REMAINING PERCENTAGE: 75 %
MDC IDC MSMT LEADCHNL RA IMPEDANCE VALUE: 390 Ohm
MDC IDC MSMT LEADCHNL RV SENSING INTR AMPL: 11.7 mV
MDC IDC SET LEADCHNL RA PACING AMPLITUDE: 2 V
MDC IDC SET LEADCHNL RV SENSING SENSITIVITY: 0.5 mV
MDC IDC STAT BRADY RA PERCENT PACED: 70 %
MDC IDC STAT BRADY RV PERCENT PACED: 96 %
Pulse Gen Serial Number: 7175865

## 2015-10-12 ENCOUNTER — Encounter: Payer: Self-pay | Admitting: Cardiology

## 2015-10-21 ENCOUNTER — Telehealth: Payer: Self-pay

## 2015-10-21 NOTE — Telephone Encounter (Signed)
Remote ICM transmission received.  Attempted patient call and voice mail box has not been set up 

## 2015-10-27 NOTE — Telephone Encounter (Signed)
Unable to reach patient for remote monthly ICM follow up.  Next remote ICM transmission scheduled for 11/09/2015 and patient letter sent with new date and to call if experiencing any symptoms.    Corvue thoracic impedance above reference line from 10/16/2015 to 10/19/2015 suggesting dryness.    ICM trend for 10/20/2015

## 2015-11-09 ENCOUNTER — Ambulatory Visit (INDEPENDENT_AMBULATORY_CARE_PROVIDER_SITE_OTHER): Payer: 59

## 2015-11-09 DIAGNOSIS — Z95810 Presence of automatic (implantable) cardiac defibrillator: Secondary | ICD-10-CM

## 2015-11-09 DIAGNOSIS — I5022 Chronic systolic (congestive) heart failure: Secondary | ICD-10-CM | POA: Diagnosis not present

## 2015-11-11 NOTE — Progress Notes (Signed)
EPIC Encounter for ICM Monitoring  Patient Name: Lonnie [name] is a 70 y.o. male Date: 11/11/2015 Primary Care Physican: Lonnie SmilesFUSCO,Lonnie J., MD Primary Cardiologist: Hochrein Electrophysiologist: Allred Dry Weight: 218 lb   Bi-V Pacing 94%      In the past month, have you:  1. Gained more than 2 pounds in a day or more than 5 pounds in a week? Gained 10 lbs in last 2- 3 weeks.  Previous weight was around 207 lbs  2. Had changes in your medications (with verification of current medications)? no  3. Had more shortness of breath than is usual for you? no  4. Limited your activity because of shortness of breath? no  5. Not been able to sleep because of shortness of breath? no  6. Had increased swelling in your feet or ankles? no  7. Had symptoms of dehydration (dizziness, dry mouth, increased thirst, decreased urine output) no  8. Had changes in sodium restriction? no  9. Been compliant with medication? Yes   ICM trend: 3 month view for 11/09/2015   ICM trend: 1 year view for 11/09/2015   Follow-up plan: ICM clinic phone appointment on 11/16/2015.  Corvue thoracic impedance below reference line 10/21/2015 to 10/24/2015 and 11/03/2014 to 11/06/2015 suggesting fluid retention.  Patient reported a 10 lb weight gain in the last 2-3 weeks but had contributed it to eating more.  Reviewed fluid symptoms and he denied any symptoms.  Thoracic impedance returned to reference line 11/07/2015 suggesting fluid levels had become stable.  Encouraged him to follow low salt diet.  Advised would repeat transmission on 11/16/2015 to recheck fluid levels and he should call if he begins to have any fluid symptoms before that transmission.     Copy of note sent to patient's primary care physician, primary cardiologist, and device following physician.  Karie SodaLaurie S Short, RN, CCM 11/11/2015 2:17 PM

## 2015-11-13 ENCOUNTER — Encounter: Payer: Self-pay | Admitting: Internal Medicine

## 2015-11-16 ENCOUNTER — Ambulatory Visit (INDEPENDENT_AMBULATORY_CARE_PROVIDER_SITE_OTHER): Payer: Self-pay

## 2015-11-16 DIAGNOSIS — Z95810 Presence of automatic (implantable) cardiac defibrillator: Secondary | ICD-10-CM

## 2015-11-16 DIAGNOSIS — I5022 Chronic systolic (congestive) heart failure: Secondary | ICD-10-CM

## 2015-11-16 NOTE — Progress Notes (Signed)
EPIC Encounter for ICM Monitoring  Patient Name: Lonnie [name] is a 70 y.o. male Date: 11/16/2015 Primary Care Physican: Cassell SmilesFUSCO,LAWRENCE J., MD Primary Cardiologist: Hochrein Electrophysiologist: Allred Dry Weight: Unknown - has not weighed since 11/11/2015       In the past month, have you:  1. Gained more than 2 pounds in a day or more than 5 pounds in a week? Unknown  2. Had changes in your medications (with verification of current medications)? no  3. Had more shortness of breath than is usual for you? no  4. Limited your activity because of shortness of breath? no  5. Not been able to sleep because of shortness of breath? no  6. Had increased swelling in your feet or ankles? no  7. Had symptoms of dehydration (dizziness, dry mouth, increased thirst, decreased urine output) no  8. Had changes in sodium restriction? no  9. Been compliant with medication? Yes   ICM trend: 3 month view for 11/16/2015   ICM trend: 1 year view for 11/16/2015   Follow-up plan: ICM clinic phone appointment on 12/19/2015.  Corvue thoracic impedance trending along reference line suggesting stable fluid levels.  He denied any fluid symptoms and contributing weight gain to increase in appetite.    AT/AF Burden has increased from <1% on 3/3/201/ to 3.4% on 11/16/2015.  He denied any symptoms and stated he is feeling fine.  Patient is not anticoagulated . Advised will have Dr [name] review transmission and recommendations regarding AT/AF burden.     Copy of note sent to patient's primary care physician, primary cardiologist, and device following physician.  Karie SodaLaurie S Short, RN, CCM 11/16/2015 5:45 PM   Reviewed AT/AF burden increase with Dr [name] in the office. He recommended patient have an appointment with [name], NP as soon as available.           Call to patient and notified that Dr [name] would like for him to make an appointment with [name], NP to discuss recent change in transmission.   Advised scheduler will contact him to make an      appointment.

## 2015-11-24 ENCOUNTER — Encounter: Payer: 59 | Admitting: Internal Medicine

## 2015-11-28 ENCOUNTER — Encounter: Payer: Self-pay | Admitting: Internal Medicine

## 2015-11-28 ENCOUNTER — Ambulatory Visit (INDEPENDENT_AMBULATORY_CARE_PROVIDER_SITE_OTHER): Payer: 59 | Admitting: Internal Medicine

## 2015-11-28 VITALS — BP 106/62 | HR 84 | Ht 73.0 in | Wt 213.4 lb

## 2015-11-28 DIAGNOSIS — S065X9A Traumatic subdural hemorrhage with loss of consciousness of unspecified duration, initial encounter: Secondary | ICD-10-CM

## 2015-11-28 DIAGNOSIS — S065XAA Traumatic subdural hemorrhage with loss of consciousness status unknown, initial encounter: Secondary | ICD-10-CM

## 2015-11-28 DIAGNOSIS — I2510 Atherosclerotic heart disease of native coronary artery without angina pectoris: Secondary | ICD-10-CM

## 2015-11-28 DIAGNOSIS — I480 Paroxysmal atrial fibrillation: Secondary | ICD-10-CM | POA: Diagnosis not present

## 2015-11-28 DIAGNOSIS — I5022 Chronic systolic (congestive) heart failure: Secondary | ICD-10-CM

## 2015-11-28 DIAGNOSIS — I4891 Unspecified atrial fibrillation: Secondary | ICD-10-CM | POA: Insufficient documentation

## 2015-11-28 DIAGNOSIS — I6200 Nontraumatic subdural hemorrhage, unspecified: Secondary | ICD-10-CM | POA: Diagnosis not present

## 2015-11-28 LAB — BASIC METABOLIC PANEL
BUN: 27 mg/dL — ABNORMAL HIGH (ref 7–25)
CALCIUM: 8.9 mg/dL (ref 8.6–10.3)
CHLORIDE: 106 mmol/L (ref 98–110)
CO2: 28 mmol/L (ref 20–31)
Creat: 1.51 mg/dL — ABNORMAL HIGH (ref 0.70–1.18)
GLUCOSE: 90 mg/dL (ref 65–99)
Potassium: 4.1 mmol/L (ref 3.5–5.3)
SODIUM: 143 mmol/L (ref 135–146)

## 2015-11-28 LAB — CBC WITH DIFFERENTIAL/PLATELET
BASOS PCT: 0 % (ref 0–1)
Basophils Absolute: 0 10*3/uL (ref 0.0–0.1)
Eosinophils Absolute: 0.2 10*3/uL (ref 0.0–0.7)
Eosinophils Relative: 2 % (ref 0–5)
HEMATOCRIT: 39.3 % (ref 39.0–52.0)
HEMOGLOBIN: 13.4 g/dL (ref 13.0–17.0)
LYMPHS ABS: 2 10*3/uL (ref 0.7–4.0)
Lymphocytes Relative: 25 % (ref 12–46)
MCH: 29.8 pg (ref 26.0–34.0)
MCHC: 34.1 g/dL (ref 30.0–36.0)
MCV: 87.3 fL (ref 78.0–100.0)
MONO ABS: 1 10*3/uL (ref 0.1–1.0)
MONOS PCT: 12 % (ref 3–12)
MPV: 10.8 fL (ref 8.6–12.4)
NEUTROS ABS: 4.9 10*3/uL (ref 1.7–7.7)
NEUTROS PCT: 61 % (ref 43–77)
Platelets: 195 10*3/uL (ref 150–400)
RBC: 4.5 MIL/uL (ref 4.22–5.81)
RDW: 14.2 % (ref 11.5–15.5)
WBC: 8.1 10*3/uL (ref 4.0–10.5)

## 2015-11-28 NOTE — Progress Notes (Signed)
ICM remote transmission rescheduled from 12/19/2015 to 12/29/2015 due to patient had office visit defib check with Dr [name] 11/28/2015.

## 2015-11-28 NOTE — Patient Instructions (Addendum)
Medication Instructions:  Your physician recommends that you continue on your current medications as directed. Please refer to the Current Medication list given to you today.    Labwork: Your physician recommends that you return for lab work today:BMP/BNP/CBC   Testing/Procedures: Your physician has requested that you have a TEE. During a TEE, sound waves are used to create images of your heart. It provides your doctor with information about the size and shape of your heart and how well your heart's chambers and valves are working. In this test, a transducer is attached to the end of a flexible tube that's guided down your throat and into your esophagus (the tube leading from you mouth to your stomach) to get a more detailed image of your heart. You are not awake for the procedure. Please see the instruction sheet given to you today. For further information please visit [email or web address].   TEE on 12/05/15 with Dr Jens Somrenshaw   [facility].  Do not eat or drink after midnight the night prior to the surgery.  Okay to take medications with a small sip of water the morning of TEE  Case number [id number]     Follow-Up:   Will call after TEE with instructions as to next visit  Any Other Special Instructions Will Be Listed Below (If Applicable).     If you need a refill on your cardiac medications before your next appointment, please call your pharmacy.

## 2015-11-28 NOTE — Progress Notes (Signed)
Electrophysiology Office Note Date: 11/28/2015  ID:  Lonnie [name], DOB 27-Dec-1945, MRN [id number]  PCP: Cassell SmilesFUSCO,LAWRENCE J., [name] Primary Cardiologist: Hochrein Electrophysiologist: Bentzion Dauria  CC: ICD follow-up  Lonnie BerkshireRobert L Lonnie [name] is a 70 y.o. male seen today for electrophysiology follow-up for increased atrial tach burden/atrial fibrillation on remote interrogation.  Since last being seen in our clinic, the patient reports doing very well. He denies chest pain, palpitations, dyspnea, PND, orthopnea, nausea, vomiting, dizziness, syncope, edema, weight gain, or early satiety.  He has not had ICD shocks.   Device History: STJ dual chamber ICD implanted 2015 for secondary prevention History of appropriate therapy: Yes History of AAD therapy: Yes   Past Medical History  Diagnosis Date  . High cholesterol   . Hypertension   . Constipation   . Reflux   . Complication of anesthesia     "Hard time waking me up" after sedation after dental procedure  . CAD (coronary artery disease)     LAD 95% proximal stenosis, D1 50-60% stenosis, the circumflex 40% stenosis, RCA subtotal stenosis.  . Cardiomyopathy, ischemic     EF was 30-35% by echo but 45-50% by cath.  Most recent EF 15%.    . Mitral regurgitation     Secondary to papillary muscle rupture  . H/O cardiac arrest   . Atrial tachycardia (HCC)   . Ventricular fibrillation (HCC) 02/16/14    successfully defibrillation by his ICD   Past Surgical History  Procedure Laterality Date  . Stomach ulcer repair    . Tee without cardioversion N/A 02/04/2013    Procedure: TRANSESOPHAGEAL ECHOCARDIOGRAM (TEE);  Surgeon: Lonnie [name];  [facility];  Service: Cardiovascular;  Laterality: N/A;  . Intraoperative transesophageal echocardiogram N/A 02/05/2013    Procedure: INTRAOPERATIVE TRANSESOPHAGEAL ECHOCARDIOGRAM;  Surgeon: Lonnie [name];  [facility];  Service: Open Heart Surgery;  Laterality: N/A;  . Endovein harvest  of greater saphenous vein Right 02/05/2013    Procedure: ENDOVEIN HARVEST OF GREATER SAPHENOUS VEIN;  Surgeon: Lonnie [name];  [facility];  Service: Open Heart Surgery;  Laterality: Right;  . Patent foramen ovale closure N/A 02/05/2013    Procedure: PATENT FORAMEN OVALE CLOSURE;  Surgeon: Lonnie [name];  [facility];  Service: Open Heart Surgery;  Laterality: N/A;  . Mitral valve replacement (mvr)/coronary artery bypass grafting (cabg) N/A 02/05/2013    Procedure: MITRAL VALVE REPLACEMENT (MVR)/CORONARY ARTERY BYPASS GRAFTING (CABG);  Surgeon: Lonnie [name];  [facility];  Service: Open Heart Surgery;  Laterality: N/A;  x3 using right greater saphenous vein and left internal mammary.   . Coronary artery bypass graft    . Implantable cardioverter defibrillator implant  12/03/2013    STJ Fortify ICD implanted for secondary prevention by Dr Lonnie [name]  . Cardioversion N/A 01/25/2014    Procedure: CARDIOVERSION;  Surgeon: Lonnie [name];  [facility];  Service: Cardiovascular;  Laterality: N/A;  . Left heart catheterization with coronary angiogram N/A 02/03/2013    Procedure: LEFT HEART CATHETERIZATION WITH CORONARY ANGIOGRAM;  Surgeon: Lonnie [name];  [facility];  Service: Cardiovascular;  Laterality: N/A;  . Left heart catheterization with coronary/graft angiogram N/A 11/01/2013    Procedure: LEFT HEART CATHETERIZATION WITH Isabel CapriceORONARY/GRAFT ANGIOGRAM;  Surgeon: Lonnie [name];  [facility];  Service: Cardiovascular;  Laterality: N/A;  . Implantable cardioverter defibrillator implant N/A 12/03/2013    Procedure: IMPLANTABLE CARDIOVERTER DEFIBRILLATOR IMPLANT;  Surgeon: Lonnie [name];  [facility] C[facility];  Service: Cardiovascular;  Laterality: N/A;    Current Outpatient Prescriptions  Medication Sig Dispense Refill  . acetaminophen (TYLENOL) 500 MG tablet Take 500 mg by mouth every 6 (six) hours as needed for mild  pain or moderate pain.     Marland Kitchen aspirin 81 MG tablet Take 81 mg by mouth daily.    Marland Kitchen atorvastatin (LIPITOR) 80 MG tablet Take 1 tablet (80 mg total) by mouth daily at 6 PM. 30 tablet 11  . bisoprolol (ZEBETA) 10 MG tablet TAKE ONE TABLET BY MOUTH ONCE DAILY 30 tablet 6  . furosemide (LASIX) 40 MG tablet Take 1 tablet (40 mg total) by mouth daily. 30 tablet 11  . hydrALAZINE (APRESOLINE) 25 MG tablet Take 1 tablet (25 mg total) by mouth every 8 (eight) hours. 90 tablet 11  . isosorbide mononitrate (IMDUR) 30 MG 24 hr tablet Take 1 tablet (30 mg total) by mouth daily. 30 tablet 11  . nitroGLYCERIN (NITROSTAT) 0.4 MG SL tablet Place 1 tablet (0.4 mg total) under the tongue every 5 (five) minutes as needed for chest pain. 25 tablet 3   No current facility-administered medications for this visit.    Allergies:   Review of patient's allergies indicates no known allergies.   Social History: Social History   Social History  . Marital Status: Married    Spouse Name: N/A  . Number of Children: N/A  . Years of Education: N/A   Occupational History  . Not on file.   Social History Main Topics  . Smoking status: Former Smoker    Types: Cigarettes    Quit date: 06/04/1974  . Smokeless tobacco: Not on file     Comment: 40 yrs ago  . Alcohol Use: 0.0 oz/week    0 Standard drinks or equivalent per week     Comment: week end - 1/5 of crown- 9/25 ocassionally-AJ  . Drug Use: No  . Sexual Activity: Not on file   Other Topics Concern  . Not on file   Social History Narrative   Drinks about five cups of caffeine a day.    Family History: Family History  Problem Relation Age of Onset  . Heart attack Mother 34  . Heart attack Brother     Review of Systems: All other systems reviewed and are otherwise negative except as noted above.   Physical Exam: VS:  BP 106/62 mmHg  Pulse 84  Ht  (1.854 m)  Wt 213 lb 6.4 oz (96.798 kg)  BMI 28.16 kg/m2 , BMI Body mass index is 28.16  kg/(m^2).  GEN- The patient is elderly appearing, alert and oriented x 3 today.   HEENT: normocephalic, atraumatic; sclera clear, conjunctiva pink; hearing intact; oropharynx clear; neck supple Lungs- Clear to ausculation bilaterally, normal work of breathing.  No wheezes, rales, rhonchi Heart- Regular rate and rhythm GI- soft, non-tender, non-distended, bowel sounds present Extremities- no clubbing, cyanosis, or edema; DP/PT/radial pulses 2+ bilaterally MS- no significant deformity or atrophy Skin- warm and dry, no rash or lesion; ICD pocket well healed Psych- euthymic mood, full affect Neuro- strength and sensation are intact  ICD interrogation- reviewed in detail today,  See PACEART report  EKG:  EKG is not ordered today.  Recent Labs: 07/12/2015: TSH 1.492 07/13/2015: ALT 27; Hemoglobin 11.3*; Platelets 147* 08/02/2015: BUN 23; Creat 1.40*; Magnesium 2.0; Potassium 4.0; Sodium 142   Wt Readings from Last 3 Encounters:  11/28/15 213 lb 6.4 oz (96.798  kg)  08/02/15 209 lb 6.4 oz (94.983 kg)  07/14/15 218 lb (98.884 kg)     Other studies Reviewed: Additional studies/ records that were reviewed today include: previous office notes  Assessment and Plan:  1.  Chronic systolic dysfunction euvolemic today Stable on an appropriate medical regimen Normal ICD function See Pace Art report No changes today VP at 90% today - pt would like to continue to defer CRT upgrade in the absence of symptoms   2.  Atrial tachycardia/paroxysmal atrial fibrillation  Burden by device interrogation today 4.8% Asymptomatic He has had prior SDH on Eliquis.  This patients CHA2DS2-VASc Score and unadjusted Ischemic Stroke Rate (% per year) is equal to 4.8 % stroke rate/year from a score of 4 which necessitates long term oral anticoagulation to prevent stroke. HasBled score is 3.  Modified Rankin Score is 0. Unfortunately, with prior SDH, he is not a great candidate for long term anticoagulation. I do  think he could tolerate short term OAC.  Procedural risks for the Watchman implant have been reviewed with the patient including a 1% risk of stroke, 2% risk of perforation, 0.1% risk of device embolization.  Given the patient's poor candidacy for long-term oral anticoagulation, ability to tolerate short term oral anticoagulation, I have recommended the watchman left atrial appendage closure system.  TEE will be scheduled to review LAA anatomy.  The patient understands that the ability to implant Watchman is dependent on results of the TEE.  If patient is candidate for Watchman based on TEE results, we will schedule the procedure at the next available time.  Will discuss with neurosurgery ability to take anticoagulation and Dr Antoine Poche who knows the patient well.  Risks, benefits for TEE discussed with patient today  3.  VF No recent recurrence Continue medical therapy  4.  HTN Stable No change required today  Current medicines are reviewed at length with the patient today.   The patient does not have concerns regarding his medicines.  The following changes were made today:  none  Labs/ tests ordered today include: BMET, BNP,CBC, TEE     Disposition:   Follow up with EP NP by phone following TEE    Signed, Hillis Range, [name]   11/28/2015 10:35 AM  [facility] [address] [place] [address] [phone] (office) [phone] (fax

## 2015-11-29 LAB — CUP PACEART INCLINIC DEVICE CHECK
Battery Remaining Longevity: 61.2
Brady Statistic RA Percent Paced: 62 %
HighPow Impedance: 65.25 Ohm
Implantable Lead Implant Date: 20150326
Implantable Lead Location: 753860
Lead Channel Impedance Value: 412.5 Ohm
Lead Channel Pacing Threshold Pulse Width: 0.5 ms
Lead Channel Pacing Threshold Pulse Width: 0.5 ms
Lead Channel Setting Pacing Amplitude: 2 V
Lead Channel Setting Pacing Amplitude: 2.5 V
Lead Channel Setting Pacing Pulse Width: 0.5 ms
MDC IDC LEAD IMPLANT DT: 20150326
MDC IDC LEAD LOCATION: 753859
MDC IDC MSMT LEADCHNL RA PACING THRESHOLD AMPLITUDE: 0.75 V
MDC IDC MSMT LEADCHNL RA SENSING INTR AMPL: 5 mV
MDC IDC MSMT LEADCHNL RV IMPEDANCE VALUE: 400 Ohm
MDC IDC MSMT LEADCHNL RV PACING THRESHOLD AMPLITUDE: 1.25 V
MDC IDC MSMT LEADCHNL RV SENSING INTR AMPL: 11.7 mV
MDC IDC PG SERIAL: 7175865
MDC IDC SESS DTM: 20170320140242
MDC IDC SET LEADCHNL RV SENSING SENSITIVITY: 0.5 mV
MDC IDC STAT BRADY RV PERCENT PACED: 90 %

## 2015-11-29 LAB — BRAIN NATRIURETIC PEPTIDE: BRAIN NATRIURETIC PEPTIDE: 137.2 pg/mL — AB (ref <100)

## 2015-12-05 ENCOUNTER — Encounter (HOSPITAL_COMMUNITY): Payer: Self-pay

## 2015-12-05 ENCOUNTER — Ambulatory Visit (HOSPITAL_BASED_OUTPATIENT_CLINIC_OR_DEPARTMENT_OTHER): Payer: 59

## 2015-12-05 ENCOUNTER — Ambulatory Visit (HOSPITAL_COMMUNITY)
Admission: RE | Admit: 2015-12-05 | Discharge: 2015-12-05 | Disposition: A | Discharge status: Home / Self Care | Payer: 59 | Source: Ambulatory Visit | Attending: Cardiology | Admitting: Cardiology

## 2015-12-05 ENCOUNTER — Encounter (HOSPITAL_COMMUNITY)
Admission: RE | Disposition: A | Discharge status: Home / Self Care | Payer: Self-pay | Source: Ambulatory Visit | Attending: Cardiology

## 2015-12-05 DIAGNOSIS — Z7901 Long term (current) use of anticoagulants: Secondary | ICD-10-CM | POA: Insufficient documentation

## 2015-12-05 DIAGNOSIS — I2510 Atherosclerotic heart disease of native coronary artery without angina pectoris: Secondary | ICD-10-CM | POA: Insufficient documentation

## 2015-12-05 DIAGNOSIS — Z955 Presence of coronary angioplasty implant and graft: Secondary | ICD-10-CM | POA: Diagnosis not present

## 2015-12-05 DIAGNOSIS — I10 Essential (primary) hypertension: Secondary | ICD-10-CM | POA: Insufficient documentation

## 2015-12-05 DIAGNOSIS — Z8249 Family history of ischemic heart disease and other diseases of the circulatory system: Secondary | ICD-10-CM | POA: Diagnosis not present

## 2015-12-05 DIAGNOSIS — Z87891 Personal history of nicotine dependence: Secondary | ICD-10-CM | POA: Diagnosis not present

## 2015-12-05 DIAGNOSIS — I4891 Unspecified atrial fibrillation: Secondary | ICD-10-CM | POA: Diagnosis not present

## 2015-12-05 DIAGNOSIS — Z7982 Long term (current) use of aspirin: Secondary | ICD-10-CM | POA: Diagnosis not present

## 2015-12-05 DIAGNOSIS — I351 Nonrheumatic aortic (valve) insufficiency: Secondary | ICD-10-CM | POA: Diagnosis not present

## 2015-12-05 DIAGNOSIS — I480 Paroxysmal atrial fibrillation: Secondary | ICD-10-CM | POA: Diagnosis not present

## 2015-12-05 DIAGNOSIS — Z952 Presence of prosthetic heart valve: Secondary | ICD-10-CM | POA: Diagnosis not present

## 2015-12-05 HISTORY — PX: TEE WITHOUT CARDIOVERSION: SHX5443

## 2015-12-05 SURGERY — ECHOCARDIOGRAM, TRANSESOPHAGEAL
Anesthesia: Moderate Sedation

## 2015-12-05 MED ORDER — MIDAZOLAM HCL 10 MG/2ML IJ SOLN
INTRAMUSCULAR | Status: DC | PRN
  Administered 2015-12-05 (×2): 2 mg via INTRAVENOUS

## 2015-12-05 MED ORDER — SODIUM CHLORIDE 0.9 % IV SOLN: INTRAVENOUS | Status: DC

## 2015-12-05 MED ORDER — MIDAZOLAM HCL 5 MG/ML IJ SOLN
INTRAMUSCULAR | Status: AC
  Filled 2015-12-05: qty 2

## 2015-12-05 MED ORDER — LIDOCAINE VISCOUS 2 % MT SOLN
OROMUCOSAL | Status: AC
  Filled 2015-12-05: qty 15

## 2015-12-05 MED ORDER — FENTANYL CITRATE (PF) 100 MCG/2ML IJ SOLN
INTRAMUSCULAR | Status: AC
  Filled 2015-12-05: qty 2

## 2015-12-05 MED ORDER — FENTANYL CITRATE (PF) 100 MCG/2ML IJ SOLN
INTRAMUSCULAR | Status: DC | PRN
  Administered 2015-12-05: 25 ug via INTRAVENOUS

## 2015-12-05 MED ORDER — SODIUM CHLORIDE 0.9 % IV SOLN
INTRAVENOUS | Status: DC
  Administered 2015-12-05: 500 mL via INTRAVENOUS

## 2015-12-05 MED ORDER — BUTAMBEN-TETRACAINE-BENZOCAINE 2-2-14 % EX AERO
INHALATION_SPRAY | CUTANEOUS | Status: DC | PRN
  Administered 2015-12-05: 2 via TOPICAL

## 2015-12-05 NOTE — H&P (Signed)
Lonnie [name]  11/28/2015 10:00 AM  Office Visit  MRN:  [id number]   Description: Male DOB: 12-02-1945  Provider: Hillis Range, MD  Department: [address] Office       Vital Signs  Most recent update: 11/28/2015 9:59 AM by Chana Bode, CMA    BP Pulse Ht Wt BMI    106/62 mmHg 84  (1.854 m) 213 lb 6.4 oz (96.798 kg) 28.16 kg/m2      Progress Notes      Hillis Range, MD at 11/28/2015 9:52 AM     Status: Signed       Expand All Collapse All      Electrophysiology Office Note Date: 11/28/2015  ID: Lonnie [name], DOB 18-Jun-1946, MRN [id number]  PCP: Cassell Smiles., MD Primary Cardiologist: Hochrein Electrophysiologist: Allred  CC: ICD follow-up  Lonnie [name] is a 70 y.o. male seen today for electrophysiology follow-up for increased atrial tach burden/atrial fibrillation on remote interrogation. Since last being seen in our clinic, the patient reports doing very well. He denies chest pain, palpitations, dyspnea, PND, orthopnea, nausea, vomiting, dizziness, syncope, edema, weight gain, or early satiety. He has not had ICD shocks.   Device History: STJ dual chamber ICD implanted 2015 for secondary prevention History of appropriate therapy: Yes History of AAD therapy: Yes   Past Medical History  Diagnosis Date  . High cholesterol   . Hypertension   . Constipation   . Reflux   . Complication of anesthesia     "Hard time waking me up" after sedation after dental procedure  . CAD (coronary artery disease)     LAD 95% proximal stenosis, D1 50-60% stenosis, the circumflex 40% stenosis, RCA subtotal stenosis.  . Cardiomyopathy, ischemic     EF was 30-35% by echo but 45-50% by cath. Most recent EF 15%.   . Mitral regurgitation     Secondary to papillary muscle rupture  . H/O cardiac arrest   . Atrial tachycardia (HCC)   . Ventricular fibrillation (HCC) 02/16/14    successfully defibrillation  by his ICD   Past Surgical History  Procedure Laterality Date  . Stomach ulcer repair    . Tee without cardioversion N/A 02/04/2013    Procedure: TRANSESOPHAGEAL ECHOCARDIOGRAM (TEE); Surgeon: Vesta Mixer, MD; Location: [facility] ENDOSCOPY; Service: Cardiovascular; Laterality: N/A;  . Intraoperative transesophageal echocardiogram N/A 02/05/2013    Procedure: INTRAOPERATIVE TRANSESOPHAGEAL ECHOCARDIOGRAM; Surgeon: [name], MD; Location: [facility] OR; Service: Open Heart Surgery; Laterality: N/A;  . Endovein harvest of greater saphenous vein Right 02/05/2013    Procedure: ENDOVEIN HARVEST OF GREATER SAPHENOUS VEIN; Surgeon: [name], MD; Location[facility]; Service: Open Heart Surgery; Laterality: Right;  . Patent foramen ovale closure N/A 02/05/2013    Procedure: PATENT FORAMEN OVALE CLOSURE; Surgeon: [name], MD; Location: [facility] OR; Service: Open Heart Surgery; Laterality: N/A;  . Mitral valve replacement (mvr)/coronary artery bypass grafting (cabg) N/A 02/05/2013    Procedure: MITRAL VALVE REPLACEMENT (MVR)/CORONARY ARTERY BYPASS GRAFTING (CABG); Surgeon: [name], MD; Location: [facility] OR; Service: Open Heart Surgery; Laterality: N/A; x3 using right greater saphenous vein and left internal mammary.   . Coronary artery bypass graft    . Implantable cardioverter defibrillator implant  12/03/2013    STJ Fortify ICD implanted for secondary prevention by Dr Johney Frame  . Cardioversion N/A 01/25/2014    Procedure: CARDIOVERSION; Surgeon: Wendall Stade, MD; Location: [facility] ENDOSCOPY; Service: Cardiovascular; Laterality: N/A;  . Left heart catheterization with coronary angiogram N/A  02/03/2013    Procedure: LEFT HEART CATHETERIZATION WITH CORONARY ANGIOGRAM; Surgeon: Iran OuchMuhammad A Arida, MD; [facility]: [facility] CATH LAB; Service: Cardiovascular; Laterality: N/A;  . Left heart catheterization with  coronary/graft angiogram N/A 11/01/2013    Procedure: LEFT HEART CATHETERIZATION WITH Isabel CapriceORONARY/GRAFT ANGIOGRAM; Surgeon: Peter M SwazilandJordan, MD; [facility]; Service: Cardiovascular; Laterality: N/A;  . Implantable cardioverter defibrillator implant N/A 12/03/2013    Procedure: IMPLANTABLE CARDIOVERTER DEFIBRILLATOR IMPLANT; Surgeon: Gardiner RhymeJames D Allred, MD; [facility]; Service: Cardiovascular; Laterality: N/A;    Current Outpatient Prescriptions  Medication Sig Dispense Refill  . acetaminophen (TYLENOL) 500 MG tablet Take 500 mg by mouth every 6 (six) hours as needed for mild pain or moderate pain.     Marland Kitchen. aspirin 81 MG tablet Take 81 mg by mouth daily.    Marland Kitchen. atorvastatin (LIPITOR) 80 MG tablet Take 1 tablet (80 mg total) by mouth daily at 6 PM. 30 tablet 11  . bisoprolol (ZEBETA) 10 MG tablet TAKE ONE TABLET BY MOUTH ONCE DAILY 30 tablet 6  . furosemide (LASIX) 40 MG tablet Take 1 tablet (40 mg total) by mouth daily. 30 tablet 11  . hydrALAZINE (APRESOLINE) 25 MG tablet Take 1 tablet (25 mg total) by mouth every 8 (eight) hours. 90 tablet 11  . isosorbide mononitrate (IMDUR) 30 MG 24 hr tablet Take 1 tablet (30 mg total) by mouth daily. 30 tablet 11  . nitroGLYCERIN (NITROSTAT) 0.4 MG SL tablet Place 1 tablet (0.4 mg total) under the tongue every 5 (five) minutes as needed for chest pain. 25 tablet 3   No current facility-administered medications for this visit.    Allergies: Review of patient's allergies indicates no known allergies.   Social History: Social History   Social History  . Marital Status: Married    Spouse Name: N/A  . Number of Children: N/A  . Years of Education: N/A   Occupational History  . Not on file.   Social History Main Topics  . Smoking status: Former Smoker    Types: Cigarettes    Quit date: 06/04/1974  . Smokeless tobacco: Not on file      Comment: 40 yrs ago  . Alcohol Use: 0.0 oz/week    0 Standard drinks or equivalent per week     Comment: week end - 1/5 of crown- 9/25 ocassionally-AJ  . Drug Use: No  . Sexual Activity: Not on file   Other Topics Concern  . Not on file   Social History Narrative   Drinks about five cups of caffeine a day.    Family History: Family History  Problem Relation Age of Onset  . Heart attack Mother 3055  . Heart attack Brother     Review of Systems: All other systems reviewed and are otherwise negative except as noted above.   Physical Exam: VS: BP 106/62 mmHg  Pulse 84  Ht 6\' 1"  (1.854 m)  Wt 213 lb 6.4 oz (96.798 kg)  BMI 28.16 kg/m2 , BMI Body mass index is 28.16 kg/(m^2).  GEN- The patient is elderly appearing, alert and oriented x 3 today.  HEENT: normocephalic, atraumatic; sclera clear, conjunctiva pink; hearing intact; oropharynx clear; neck supple Lungs- Clear to ausculation bilaterally, normal work of breathing. No wheezes, rales, rhonchi Heart- Regular rate and rhythm GI- soft, non-tender, non-distended, bowel sounds present Extremities- no clubbing, cyanosis, or edema; DP/PT/radial pulses 2+ bilaterally MS- no significant deformity or atrophy Skin- warm and dry, no rash or lesion; ICD pocket well healed Psych- euthymic  mood, full affect Neuro- strength and sensation are intact  ICD interrogation- reviewed in detail today, See PACEART report  EKG: EKG is not ordered today.  Recent Labs: 07/12/2015: TSH 1.492 07/13/2015: ALT 27; Hemoglobin 11.3*; Platelets 147* 08/02/2015: BUN 23; Creat 1.40*; Magnesium 2.0; Potassium 4.0; Sodium 142   Wt Readings from Last 3 Encounters:  11/28/15 213 lb 6.4 oz (96.798 kg)  08/02/15 209 lb 6.4 oz (94.983 kg)  07/14/15 218 lb (98.884 kg)     Other studies Reviewed: Additional studies/ records that were reviewed today include: previous office notes  Assessment and  Plan:  1. Chronic systolic dysfunction euvolemic today Stable on an appropriate medical regimen Normal ICD function See Pace Art report No changes today VP at 90% today - pt would like to continue to defer CRT upgrade in the absence of symptoms   2. Atrial tachycardia/paroxysmal atrial fibrillation  Burden by device interrogation today 4.8% Asymptomatic He has had prior SDH on Eliquis.  This patients CHA2DS2-VASc Score and unadjusted Ischemic Stroke Rate (% per year) is equal to 4.8 % stroke rate/year from a score of 4 which necessitates long term oral anticoagulation to prevent stroke. HasBled score is 3. Modified Rankin Score is 0. Unfortunately, with prior SDH, he is not a great candidate for long term anticoagulation. I do think he could tolerate short term OAC. Procedural risks for the Watchman implant have been reviewed with the patient including a 1% risk of stroke, 2% risk of perforation, 0.1% risk of device embolization. Given the patient's poor candidacy for long-term oral anticoagulation, ability to tolerate short term oral anticoagulation, I have recommended the watchman left atrial appendage closure system. TEE will be scheduled to review LAA anatomy. The patient understands that the ability to implant Watchman is dependent on results of the TEE. If patient is candidate for Watchman based on TEE results, we will schedule the procedure at the next available time.  Will discuss with neurosurgery ability to take anticoagulation and Dr Antoine Poche who knows the patient well.  Risks, benefits for TEE discussed with patient today  3. VF No recent recurrence Continue medical therapy  4. HTN Stable No change required today  Current medicines are reviewed at length with the patient today.  The patient does not have concerns regarding his medicines. The following changes were made today: none  Labs/ tests ordered today include: BMET, BNP,CBC, TEE     Disposition:  Follow up with EP NP by phone following TEE    Signed, Hillis Range, MD  11/28/2015 10:35 AM  [facility] [address] [place] [address] [phone] (office) [phone] (fax       For TEE pre watchman; no changes. Olga Millers

## 2015-12-05 NOTE — Progress Notes (Signed)
  Echocardiogram Echocardiogram Transesophageal has been performed.  Janalyn HarderWest, Kania Regnier R 12/05/2015, 9:43 AM

## 2015-12-05 NOTE — Interval H&P Note (Signed)
History and Physical Interval Note:  12/05/2015 9:05 AM  Lonnie [name]  has presented today for surgery, with the diagnosis of AFIB  The various methods of treatment have been discussed with the patient and family. After consideration of risks, benefits and other options for treatment, the patient has consented to  Procedure(s): TRANSESOPHAGEAL ECHOCARDIOGRAM (TEE) (N/A) as a surgical intervention .  The patient's history has been reviewed, patient examined, no change in status, stable for surgery.  I have reviewed the patient's chart and labs.  Questions were answered to the patient's satisfaction.     Olga MillersBrian Ezell Melikian

## 2015-12-05 NOTE — CV Procedure (Addendum)
See full TEE report in [employer]. Patient sedated with fentanyl 25 micrograms and versed 4 mg IV. Lonnie MillersBrian Chi Garlow

## 2015-12-05 NOTE — Discharge Instructions (Signed)
Moderate Conscious Sedation, Adult, Care After °Refer to this sheet in the next few weeks. These instructions provide you with information on caring for yourself after your procedure. Your health care provider may also give you more specific instructions. Your treatment has been planned according to current medical practices, but problems sometimes occur. Call your health care provider if you have any problems or questions after your procedure. °WHAT TO EXPECT AFTER THE PROCEDURE  °After your procedure: °· You may feel sleepy, clumsy, and have poor balance for several hours. °· Vomiting may occur if you eat too soon after the procedure. °HOME CARE INSTRUCTIONS °· Do not participate in any activities where you could become injured for at least 24 hours. Do not: °¨ Drive. °¨ Swim. °¨ Ride a bicycle. °¨ Operate heavy machinery. °¨ Cook. °¨ Use power tools. °¨ Climb ladders. °¨ Work from a high place. °· Do not make important decisions or sign legal documents until you are improved. °· If you vomit, drink water, juice, or soup when you can drink without vomiting. Make sure you have little or no nausea before eating solid foods. °· Only take over-the-counter or prescription medicines for pain, discomfort, or fever as directed by your health care provider. °· Make sure you and your family fully understand everything about the medicines given to you, including what side effects may occur. °· You should not drink alcohol, take sleeping pills, or take medicines that cause drowsiness for at least 24 hours. °· If you smoke, do not smoke without supervision. °· If you are feeling better, you may resume normal activities 24 hours after you were sedated. °· Keep all appointments with your health care provider. °SEEK MEDICAL CARE IF: °· Your skin is pale or bluish in color. °· You continue to feel nauseous or vomit. °· Your pain is getting worse and is not helped by medicine. °· You have bleeding or swelling. °· You are still  sleepy or feeling clumsy after 24 hours. °SEEK IMMEDIATE MEDICAL CARE IF: °· You develop a rash. °· You have difficulty breathing. °· You develop any type of allergic problem. °· You have a fever. °MAKE SURE YOU: °· Understand these instructions. °· Will watch your condition. °· Will get help right away if you are not doing well or get worse. °  °This information is not intended to replace advice given to you by your health care provider. Make sure you discuss any questions you have with your health care provider. °  °Document Released: 06/17/2013 Document Revised: 09/17/2014 Document Reviewed: 06/17/2013 °Elsevier Interactive Patient Education ©2016 [employer] Inc[employer] ° ° °

## 2015-12-06 ENCOUNTER — Encounter (HOSPITAL_COMMUNITY): Payer: Self-pay | Admitting: Cardiology

## 2015-12-06 ENCOUNTER — Telehealth: Payer: Self-pay | Admitting: Nurse Practitioner

## 2015-12-06 NOTE — Telephone Encounter (Signed)
Spoke with patient. TEE images favorable for Watchman implant. I have sent a staff message to neurosurgery who feels that the patient will be appropriate for short term anticoagulation as long as we check a CT of the head prior to initiation.  Pt has appt with Dr Antoine PocheHochrein 12/16/15 to discuss further.  I have tentatively held a spot for 01/26/16 for Watchman pending those discussions.    I will plan to call patient back after appt with Dr Antoine PocheHochrein to follow up.  Gypsy BalsamAmber Cristine Daw, NP 12/06/2015 4:26 PM

## 2015-12-06 NOTE — Telephone Encounter (Signed)
-----   Message from Lisbeth RenshawNeelesh Nundkumar, MD sent at 12/06/2015  7:51 AM EDT ----- Regarding: Anticoagulation question He should be fine for short-term anticoagulation after atrial appendage closure. Would recommend doing a baseline head CT prior to the procedure to make sure it looks ok and serve as a baseline.

## 2015-12-09 ENCOUNTER — Other Ambulatory Visit: Payer: Self-pay | Admitting: Cardiology

## 2015-12-12 NOTE — Telephone Encounter (Signed)
Rx(s) sent to pharmacy electronically.  

## 2015-12-15 NOTE — Progress Notes (Signed)
HPI The patient presents for evaluation after CABG and mitral valve replacement. He presented late after myocardial infarction. 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 He ultimately required CABG with bioprosthetic mitral valve replacement. He had a resultant EF of 30-35%.  We had discussed an ICD and he was considering this.   He suffered cardiac arrest on 2/22 with VT/VF.  Follow up cath demonstrated patent bypass grafts.  He initially went home with a Life Vest.  He subsequently returned for ICD placement on 12/03/13.  In November his ICD fired while he was at work. Prior to that he had a syncopal episode and suffered a subdural hematoma. He has recovered from this and now has been considered for the Watchman device. He has had a repeat TEE which demonstrates his EF to be 20-25%. There was a mitral valve prosthesis. There was no evidence of atrial thrombus.    He actually says he is feeling well. He has retired. He denies any cardiovascular symptoms. The patient denies any new symptoms such as chest discomfort, neck or arm discomfort. There has been no new shortness of breath, PND or orthopnea. There have been no reported palpitations, presyncope or syncope.  He is not being as active as I would like.  No Known Allergies  Current Outpatient Prescriptions  Medication Sig Dispense Refill  . acetaminophen (TYLENOL) 500 MG tablet Take 500 mg by mouth every 6 (six) hours as needed for mild pain or moderate pain.     Marland Kitchen. aspirin 81 MG tablet Take 81 mg by mouth daily.    Marland Kitchen. atorvastatin (LIPITOR) 80 MG tablet Take 1 tablet (80 mg total) by mouth daily at 6 PM. 30 tablet 11  . bisoprolol (ZEBETA) 10 MG tablet TAKE ONE TABLET BY MOUTH ONCE DAILY 30 tablet 6  . furosemide (LASIX) 40 MG tablet Take 1 tablet (40 mg total) by mouth daily. PLEASE KEEP UPCOMING APPOINTMENT ON FRIDAY 30 tablet 0  . hydrALAZINE (APRESOLINE) 25 MG tablet Take 1 tablet (25 mg total) by mouth every 8  (eight) hours. 90 tablet 11  . isosorbide mononitrate (IMDUR) 30 MG 24 hr tablet Take 1 tablet (30 mg total) by mouth daily. PLEASE KEEP UPCOMING APPOINTMENT ON FRIDAY 30 tablet 0  . nitroGLYCERIN (NITROSTAT) 0.4 MG SL tablet Place 1 tablet (0.4 mg total) under the tongue every 5 (five) minutes as needed for chest pain. 25 tablet 3   No current facility-administered medications for this visit.    Past Medical History  Diagnosis Date  . High cholesterol   . Hypertension   . Constipation   . Reflux   . Complication of anesthesia     "Hard time waking me up" after sedation after dental procedure  . CAD (coronary artery disease)     LAD 95% proximal stenosis, D1 50-60% stenosis, the circumflex 40% stenosis, RCA subtotal stenosis.  . Cardiomyopathy, ischemic     EF was 30-35% by echo but 45-50% by cath.  Most recent EF 15%.    . Mitral regurgitation     Secondary to papillary muscle rupture  . H/O cardiac arrest   . Atrial tachycardia (HCC)   . Ventricular fibrillation (HCC) 02/16/14    successfully defibrillation by his ICD    Past Surgical History  Procedure Laterality Date  . Stomach ulcer repair    . Tee without cardioversion N/A 02/04/2013    Procedure: TRANSESOPHAGEAL ECHOCARDIOGRAM (TEE);  Surgeon: Vesta MixerPhilip J Nahser, MD;  [facility] END[facility];  Service:  Cardiovascular;  Laterality: N/A;  . Intraoperative transesophageal echocardiogram N/A 02/05/2013    Procedure: INTRAOPERATIVE TRANSESOPHAGEAL ECHOCARDIOGRAM;  Surgeon: [name], MD;  Location[facility] OR;  Service: Open Heart Surgery;  Laterality: N/A;  . Endovein harvest of greater saphenous vein Right 02/05/2013    Procedure: ENDOVEIN HARVEST OF GREATER SAPHENOUS VEIN;  Surgeon: [name], MD;  Location: [facility] OR;  Service: Open Heart Surgery;  Laterality: Right;  . Patent foramen ovale closure N/A 02/05/2013    Procedure: PATENT FORAMEN OVALE CLOSURE;  Surgeon: [name], MD;  Location: [facility] OR;  Service:  Open Heart Surgery;  Laterality: N/A;  . Mitral valve replacement (mvr)/coronary artery bypass grafting (cabg) N/A 02/05/2013    Procedure: MITRAL VALVE REPLACEMENT (MVR)/CORONARY ARTERY BYPASS GRAFTING (CABG);  Surgeon: [name], MD;  Location: [facility] OR;  Service: Open Heart Surgery;  Laterality: N/A;  x3 using right greater saphenous vein and left internal mammary.   . Coronary artery bypass graft    . Implantable cardioverter defibrillator implant  12/03/2013    STJ Fortify ICD implanted for secondary prevention by Dr Johney Frame  . Cardioversion N/A 01/25/2014    Procedure: CARDIOVERSION;  Surgeon: Wendall Stade, MD;  Location: [facility] ENDOSCOPY;  Service: Cardiovascular;  Laterality: N/A;  . Left heart catheterization with coronary angiogram N/A 02/03/2013    Procedure: LEFT HEART CATHETERIZATION WITH CORONARY ANGIOGRAM;  Surgeon: Iran Ouch, MD;  Location: [facility] CATH LAB;  Service: Cardiovascular;  Laterality: N/A;  . Left heart catheterization with coronary/graft angiogram N/A 11/01/2013    Procedure: LEFT HEART CATHETERIZATION WITH Isabel Caprice;  Surgeon: Peter M Swaziland, MD;  Location: [facility] CATH LAB;  Service: Cardiovascular;  Laterality: N/A;  . Implantable cardioverter defibrillator implant N/A 12/03/2013    Procedure: IMPLANTABLE CARDIOVERTER DEFIBRILLATOR IMPLANT;  Surgeon: Gardiner Rhyme, MD;  Location: [facility] CATH LAB;  Service: Cardiovascular;  Laterality: N/A;  . Tee without cardioversion N/A 12/05/2015    Procedure: TRANSESOPHAGEAL ECHOCARDIOGRAM (TEE);  Surgeon: Lewayne Bunting, MD;  Location: [facility] ENDOSCOPY;  Service: Cardiovascular;  Laterality: N/A;    ROS:  As stated in the HPI and negative for all other systems.   PHYSICAL EXAM BP 114/78 mmHg  Pulse 88  Ht  (1.854 m)  Wt 217 lb (98.431 kg)  BMI 28.64 kg/m2 GENERAL:  Well appearing NECK:  No JVD, waveform within normal limits, carotid upstroke brisk and symmetric, no bruits, no thyromegaly CHEST:  Well healed  ICD pocket.  LUNGS:  Slightly diminished breath sounds at the bases BACK:  No CVA tenderness CHEST:  Well healed sternotomy scar, well healed ICD scar.  HEART:  PMI not displaced or sustained,S1 and S2 within normal limits, no S3, no S4, no clicks, no rubs, soft apical systolic murmur nonradiating, no diastolic murmurs ABD:  Flat, positive bowel sounds normal in frequency in pitch, no bruits, no rebound, no guarding, no midline pulsatile mass, no hepatomegaly, no splenomegaly EXT:  2 plus pulses throughout, no lower extremity edema, no cyanosis no clubbing NEURO:  Nonfocal    ASSESSMENT AND PLAN  CARDIOMYOPATHY:   He seems to be euvolemic.  I will titrate his meds slightly with increasing his Imdur to 60 mg daily.  MVR: He has stable valve prosthesis. No change in therapy is indicated.  CAD:  The patient is having no symptoms. No further cardiovascular testing is suggested. He will continue with risk reduction.  ATRIAL FIBRILLATION:  With his recent subdural hematoma he's not an anticoagulation candidate. He  is a candidate however for watchman.  I have seen DOMINIK YORDY is a 70 y.o. male in the office today who is being considered for a Watchman left atrial appendage closure device.  He has a history of paroxysmal atrial fibrillation.  This patients CHA2DS2-VASc Score and unadjusted Ischemic Stroke Rate (% per year) is equal to 4.8 % stroke rate/year from a score of 4 which necessitates long term oral anticoagulation to prevent stroke.  Unfortunately, He is not felt to be a long term Warfarin candidate secondary to [facility].  The patients chart has been reviewed and I feel that they would be a candidate for short term oral anticoagulation.  Procedural risks for the Watchman implant have been reviewed with the patient including a 1% risk of stroke, 2% risk of perforation, 0.1% risk of device embolization.  Given the patient's poor candidacy for long-term oral anticoagulation, ability to tolerate short  term oral anticoagulation I have recommended the watchman left atrial appendage closure system.  ICD:  He is up to date and will continue to follow up and he has an upcoming appt with Dr. Johney Frame  CKD:    This has been stable as below  Lab Results  Component Value Date   CREATININE 1.51* 11/28/2015

## 2015-12-16 ENCOUNTER — Ambulatory Visit (INDEPENDENT_AMBULATORY_CARE_PROVIDER_SITE_OTHER): Payer: 59 | Admitting: Cardiology

## 2015-12-16 ENCOUNTER — Encounter: Payer: Self-pay | Admitting: Cardiology

## 2015-12-16 VITALS — BP 114/78 | HR 88 | Ht 73.0 in | Wt 217.0 lb

## 2015-12-16 DIAGNOSIS — I480 Paroxysmal atrial fibrillation: Secondary | ICD-10-CM | POA: Diagnosis not present

## 2015-12-16 MED ORDER — ISOSORBIDE MONONITRATE ER 60 MG PO TB24: 60.0000 mg | ORAL_TABLET | Freq: Every day | ORAL | Status: DC

## 2015-12-16 NOTE — Patient Instructions (Signed)
Your physician has recommended you make the following change in your medication: Increase Isosorbide 60 mg daily

## 2015-12-29 ENCOUNTER — Telehealth: Payer: Self-pay | Admitting: Cardiology

## 2015-12-29 NOTE — Telephone Encounter (Signed)
Spoke with pt and reminded pt of remote transmission that is due today. Pt verbalized understanding.   

## 2015-12-29 NOTE — Progress Notes (Signed)
Remote ICM transmission not received as scheduled on 12/29/2015.  Next ICM remote scheduled for 01/18/2016.

## 2016-01-03 ENCOUNTER — Other Ambulatory Visit: Payer: Self-pay | Admitting: Nurse Practitioner

## 2016-01-03 ENCOUNTER — Encounter: Payer: Self-pay | Admitting: Nurse Practitioner

## 2016-01-03 ENCOUNTER — Telehealth: Payer: Self-pay | Admitting: Nurse Practitioner

## 2016-01-03 DIAGNOSIS — I480 Paroxysmal atrial fibrillation: Secondary | ICD-10-CM

## 2016-01-03 DIAGNOSIS — S065X9A Traumatic subdural hemorrhage with loss of consciousness of unspecified duration, initial encounter: Secondary | ICD-10-CM

## 2016-01-03 DIAGNOSIS — S065XAA Traumatic subdural hemorrhage with loss of consciousness status unknown, initial encounter: Secondary | ICD-10-CM

## 2016-01-03 NOTE — Telephone Encounter (Signed)
Spoke with patient regarding Watchman. Dr Antoine PocheHochrein agrees that he is a candidate.  Neurosurgery states ok to proceed with short term anticoagulation as long as we get baseline CT of head prior to procedure. Reviewed all with patient. Will plan CT of head week before procedure with pre-procedure labs. Will mail letter of instructions to patient today.   Gypsy BalsamAmber Yitzhak Awan, NP 01/03/2016 8:43 AM

## 2016-01-04 NOTE — Addendum Note (Signed)
Addended by: Dennis BastLANIER, KELLY F on: 01/04/2016 02:02 PM   Modules accepted: Orders

## 2016-01-06 ENCOUNTER — Encounter: Payer: Self-pay | Admitting: Cardiology

## 2016-01-09 ENCOUNTER — Other Ambulatory Visit: Payer: Self-pay | Admitting: Cardiology

## 2016-01-10 NOTE — Telephone Encounter (Signed)
REFILL 

## 2016-01-16 ENCOUNTER — Ambulatory Visit (INDEPENDENT_AMBULATORY_CARE_PROVIDER_SITE_OTHER)
Admission: RE | Admit: 2016-01-16 | Discharge: 2016-01-16 | Disposition: A | Discharge status: Home / Self Care | Payer: 59 | Source: Ambulatory Visit | Attending: Internal Medicine | Admitting: Internal Medicine

## 2016-01-16 DIAGNOSIS — S065X9A Traumatic subdural hemorrhage with loss of consciousness of unspecified duration, initial encounter: Secondary | ICD-10-CM

## 2016-01-16 DIAGNOSIS — I6200 Nontraumatic subdural hemorrhage, unspecified: Secondary | ICD-10-CM | POA: Diagnosis not present

## 2016-01-16 DIAGNOSIS — S065XAA Traumatic subdural hemorrhage with loss of consciousness status unknown, initial encounter: Secondary | ICD-10-CM

## 2016-01-18 ENCOUNTER — Telehealth: Payer: Self-pay | Admitting: Cardiology

## 2016-01-18 ENCOUNTER — Ambulatory Visit (INDEPENDENT_AMBULATORY_CARE_PROVIDER_SITE_OTHER): Payer: 59

## 2016-01-18 DIAGNOSIS — Z95810 Presence of automatic (implantable) cardiac defibrillator: Secondary | ICD-10-CM

## 2016-01-18 DIAGNOSIS — I5022 Chronic systolic (congestive) heart failure: Secondary | ICD-10-CM | POA: Diagnosis not present

## 2016-01-18 NOTE — Telephone Encounter (Signed)
Spoke with pt and reminded pt of remote transmission that is due today. Pt verbalized understanding.   

## 2016-01-19 NOTE — Progress Notes (Signed)
EPIC Encounter for ICM Monitoring  Patient Name: Lonnie BerkshireRobert L [name] is a 70 y.o. male Date: 01/19/2016 Primary Care Physican: Cassell SmilesFUSCO,LAWRENCE J., MD Primary Cardiologist: Hochrein Electrophysiologist: Allred Dry Weight: unknown  Bi-V Pacing 75% AT/AF Burden 23%     In the past month, have you:  1. Gained more than 2 pounds in a day or more than 5 pounds in a week? no  2. Had changes in your medications (with verification of current medications)? no  3. Had more shortness of breath than is usual for you? no  4. Limited your activity because of shortness of breath? no  5. Not been able to sleep because of shortness of breath? no  6. Had increased swelling in your feet, ankles, legs or stomach area? no  7. Had symptoms of dehydration (dizziness, dry mouth, increased thirst, decreased urine output) no  8. Had changes in sodium restriction? no  9. Been compliant with medication? Yes  ICM trend: 3 month view for 01/18/2016  ICM trend: 1 year view for 01/18/2016   Follow-up plan: ICM clinic phone appointment 01/25/2016.   Patient scheduled for watchman procedure on 01/26/2016 with Dr [name].   FLUID LEVELS:  Corvue racic impedance decreased 01/17/2016 to 01/18/2016 suggesting fluid accumulation.    SYMPTOMS:  None.  Denied any symptoms such as SOB and/or lower extremity swelling.  He does not weigh routinely.  Encouraged to call for any fluid symptoms.   EDUCATION:  Advised to limit sodium intake to < 2000 mg and he reported he has cut back on his salt intake.  Advised to limit fluid intake to 64 oz daily.     Patient was due to have labs drawn prior to CT scan on 01/16/2016 but he did not get it done.  Discussed with Dr Jenel LucksAllred's nurse Dennis BastKelly Lanier, RN and advised he will need to have labs drawn prior to the procedure.  He agreed have them drawn on Monday, 01/23/2016.  11/28/2015 Creatinine 1.51, BUN 27, Potassium 4.1, Sodium 143 08/02/2015 Creatinine 1.40, BUN 23, Potassium 4.0, Sodium  142 07/14/2015 Creatinine 1.28, BUN 15, Potassium 4.6, Sodium 140   Advised will send to PCP, Dr Antoine PocheHochrein and Dr [name] for review of decreased thoracic impedance suggesting fluid accumulation, he is asymptomatic, and watchman procedure scheduled for 01/26/2016.  If any recommendations will call him back.  Repeat ICM remote transmission 01/25/2016.   Karie SodaLaurie S Kyria Bumgardner, RN, CCM 01/19/2016 11:46 AM

## 2016-01-23 ENCOUNTER — Telehealth: Payer: Self-pay | Admitting: Nurse Practitioner

## 2016-01-23 ENCOUNTER — Other Ambulatory Visit (INDEPENDENT_AMBULATORY_CARE_PROVIDER_SITE_OTHER): Payer: 59 | Admitting: *Deleted

## 2016-01-23 DIAGNOSIS — I480 Paroxysmal atrial fibrillation: Secondary | ICD-10-CM

## 2016-01-23 LAB — BASIC METABOLIC PANEL
BUN: 25 mg/dL (ref 7–25)
CO2: 27 mmol/L (ref 20–31)
Calcium: 9.1 mg/dL (ref 8.6–10.3)
Chloride: 106 mmol/L (ref 98–110)
Creat: 1.33 mg/dL — ABNORMAL HIGH (ref 0.70–1.18)
GLUCOSE: 99 mg/dL (ref 65–99)
POTASSIUM: 4.2 mmol/L (ref 3.5–5.3)
SODIUM: 141 mmol/L (ref 135–146)

## 2016-01-23 LAB — CBC WITH DIFFERENTIAL/PLATELET
BASOS PCT: 0 %
Basophils Absolute: 0 cells/uL (ref 0–200)
EOS ABS: 144 {cells}/uL (ref 15–500)
Eosinophils Relative: 2 %
HCT: 36.6 % — ABNORMAL LOW (ref 38.5–50.0)
Hemoglobin: 12.1 g/dL — ABNORMAL LOW (ref 13.2–17.1)
LYMPHS PCT: 28 %
Lymphs Abs: 2016 cells/uL (ref 850–3900)
MCH: 28.1 pg (ref 27.0–33.0)
MCHC: 33.1 g/dL (ref 32.0–36.0)
MCV: 84.9 fL (ref 80.0–100.0)
MONOS PCT: 12 %
MPV: 10.2 fL (ref 7.5–12.5)
Monocytes Absolute: 864 cells/uL (ref 200–950)
Neutro Abs: 4176 cells/uL (ref 1500–7800)
Neutrophils Relative %: 58 %
PLATELETS: 195 10*3/uL (ref 140–400)
RBC: 4.31 MIL/uL (ref 4.20–5.80)
RDW: 14.3 % (ref 11.0–15.0)
WBC: 7.2 10*3/uL (ref 3.8–10.8)

## 2016-01-23 NOTE — Telephone Encounter (Signed)
Spoke with patient's wife (DPR on file) about Watchman procedure for Thursday.  Instructions reviewed, all questions answered.  Gypsy BalsamAmber Aoi Kouns, NP 01/23/2016 11:18 AM

## 2016-01-25 ENCOUNTER — Ambulatory Visit (INDEPENDENT_AMBULATORY_CARE_PROVIDER_SITE_OTHER): Payer: 59

## 2016-01-25 ENCOUNTER — Encounter (HOSPITAL_COMMUNITY): Payer: Self-pay | Admitting: Certified Registered Nurse Anesthetist

## 2016-01-25 DIAGNOSIS — I5022 Chronic systolic (congestive) heart failure: Secondary | ICD-10-CM

## 2016-01-25 DIAGNOSIS — Z95810 Presence of automatic (implantable) cardiac defibrillator: Secondary | ICD-10-CM

## 2016-01-25 NOTE — Anesthesia Preprocedure Evaluation (Addendum)
Anesthesia Evaluation  Patient identified by MRN, date of birth, ID band Patient awake    Reviewed: Allergy & Precautions, NPO status , Patient's Chart, lab work & pertinent test results  History of Anesthesia Complications (+) PROLONGED EMERGENCE and history of anesthetic complications  Airway Mallampati: II  TM Distance: >3 FB Neck ROM: Full    Dental no notable dental hx.    Pulmonary neg pulmonary ROS, former smoker,    Pulmonary exam normal breath sounds clear to auscultation       Cardiovascular Exercise Tolerance: Poor hypertension, Pt. on medications and Pt. on home beta blockers + CAD, + Past MI, + CABG, + Peripheral Vascular Disease, +CHF and + DOE  + dysrhythmias Atrial Fibrillation and Supra Ventricular Tachycardia + Cardiac Defibrillator + Valvular Problems/Murmurs MR and AI  Rhythm:Regular Rate:Normal + Systolic murmurs ECHO 12/05/15 - EF 20% to 25%. - Aortic valve:  mildregurgitation. - Ascending aorta: The ascending aorta was mildly dilated. - Mitral valve: A bioprosthesis was present. - Atrial septum: There was a patent foramen ovale. Impressions: - Inferior and inferior lateral akinesis with aneurysm formationwith other walls hypokinetic; overall severely reduced LV function; moderate LAE with no LAA thrombus; s/p bioprosthetic MVR with trace MR; sclerotic aortic valve with mild AI; mild TR;patent foramen ovale by color doppler. Prewatchman device    Neuro/Psych H/O Embolic stroke involving left posterior cerebral artery Chronic unilateral cerebral infarction, watershed distribution MCI (mild cognitive impairment) SDH (subdural hematoma) Intracranial bleed    CVA, No Residual Symptoms negative neurological ROS  negative psych ROS   GI/Hepatic negative GI ROS, Neg liver ROS,   Endo/Other  negative endocrine ROS  Renal/GU Renal InsufficiencyRenal diseasenegative Renal ROS  negative genitourinary    Musculoskeletal negative musculoskeletal ROS (+)   Abdominal   Peds negative pediatric ROS (+)  Hematology negative hematology ROS (+)   Anesthesia Other Findings Out of hospital cardiac arrest on 11/01/13 with VT/VF  Reproductive/Obstetrics negative OB ROS                        EKG 07/12/15: Sinus rhythm Nonspecific IVCD with LAD Left ventricular hypertrophy Abnormal T, consider ischemia, lateral leads Ventricularly paced rhythm with PACs Reconfirmed by LIU MD, DANA  BP Readings from Last 3 Encounters:  12/16/15 114/78  12/05/15 127/65  11/28/15 106/62   Lab Results  Component Value Date   WBC 7.2 01/23/2016   HGB 12.1* 01/23/2016   HCT 36.6* 01/23/2016   MCV 84.9 01/23/2016   PLT 195 01/23/2016     Chemistry      Component Value Date/Time   NA 141 01/23/2016 0818   K 4.2 01/23/2016 0818   CL 106 01/23/2016 0818   CO2 27 01/23/2016 0818   BUN 25 01/23/2016 0818   CREATININE 1.33* 01/23/2016 0818   CREATININE 1.28* 07/14/2015 0420      Component Value Date/Time   CALCIUM 9.1 01/23/2016 0818   ALKPHOS 94 07/13/2015 0336   AST 36 07/13/2015 0336   ALT 27 07/13/2015 0336   BILITOT 1.5* 07/13/2015 0336     Lab Results  Component Value Date   INR 1.10 11/02/2013   INR 1.14 11/01/2013   INR 2.0 04/10/2013   Lab Results  Component Value Date   HGBA1C 5.3 02/04/2013     Anesthesia Physical Anesthesia Plan  ASA: IV  Anesthesia Plan: General   Post-op Pain Management:    Induction: Intravenous  Airway Management Planned: Oral ETT  Additional Equipment:  Arterial line  Intra-op Plan:   Post-operative Plan: Extubation in OR and Possible Post-op intubation/ventilation  Informed Consent: I have reviewed the patients History and Physical, chart, labs and discussed the procedure including the risks, benefits and alternatives for the proposed anesthesia with the patient or authorized representative who has indicated his/her  understanding and acceptance.   Dental advisory given  Plan Discussed with: CRNA, Anesthesiologist and Surgeon  Anesthesia Plan Comments:        Anesthesia Quick Evaluation

## 2016-01-26 ENCOUNTER — Inpatient Hospital Stay (HOSPITAL_COMMUNITY)
Admission: RE | Admit: 2016-01-26 | Discharge: 2016-01-27 | DRG: 274 | Disposition: A | Discharge status: Home / Self Care | Payer: Medicare Other | Source: Ambulatory Visit | Attending: Cardiovascular Disease | Admitting: Cardiovascular Disease

## 2016-01-26 ENCOUNTER — Encounter (HOSPITAL_COMMUNITY)
Admission: RE | Disposition: A | Discharge status: Home / Self Care | Payer: Self-pay | Source: Ambulatory Visit | Attending: Cardiovascular Disease

## 2016-01-26 ENCOUNTER — Inpatient Hospital Stay (HOSPITAL_COMMUNITY): Payer: Medicare Other | Admitting: Certified Registered Nurse Anesthetist

## 2016-01-26 ENCOUNTER — Encounter (HOSPITAL_COMMUNITY): Payer: Self-pay | Admitting: Anesthesiology

## 2016-01-26 ENCOUNTER — Inpatient Hospital Stay (HOSPITAL_COMMUNITY): Payer: Medicare Other

## 2016-01-26 DIAGNOSIS — I110 Hypertensive heart disease with heart failure: Secondary | ICD-10-CM | POA: Diagnosis present

## 2016-01-26 DIAGNOSIS — I471 Supraventricular tachycardia: Secondary | ICD-10-CM | POA: Diagnosis not present

## 2016-01-26 DIAGNOSIS — Z87891 Personal history of nicotine dependence: Secondary | ICD-10-CM

## 2016-01-26 DIAGNOSIS — I2510 Atherosclerotic heart disease of native coronary artery without angina pectoris: Secondary | ICD-10-CM | POA: Diagnosis not present

## 2016-01-26 DIAGNOSIS — Z953 Presence of xenogenic heart valve: Secondary | ICD-10-CM

## 2016-01-26 DIAGNOSIS — Z006 Encounter for examination for normal comparison and control in clinical research program: Secondary | ICD-10-CM

## 2016-01-26 DIAGNOSIS — Z95810 Presence of automatic (implantable) cardiac defibrillator: Secondary | ICD-10-CM | POA: Diagnosis not present

## 2016-01-26 DIAGNOSIS — I255 Ischemic cardiomyopathy: Secondary | ICD-10-CM | POA: Diagnosis present

## 2016-01-26 DIAGNOSIS — Z8674 Personal history of sudden cardiac arrest: Secondary | ICD-10-CM

## 2016-01-26 DIAGNOSIS — Q211 Atrial septal defect: Secondary | ICD-10-CM | POA: Diagnosis not present

## 2016-01-26 DIAGNOSIS — Z8249 Family history of ischemic heart disease and other diseases of the circulatory system: Secondary | ICD-10-CM

## 2016-01-26 DIAGNOSIS — I739 Peripheral vascular disease, unspecified: Secondary | ICD-10-CM | POA: Diagnosis not present

## 2016-01-26 DIAGNOSIS — I5022 Chronic systolic (congestive) heart failure: Secondary | ICD-10-CM | POA: Diagnosis present

## 2016-01-26 DIAGNOSIS — I4891 Unspecified atrial fibrillation: Secondary | ICD-10-CM | POA: Diagnosis present

## 2016-01-26 DIAGNOSIS — I480 Paroxysmal atrial fibrillation: Principal | ICD-10-CM | POA: Diagnosis present

## 2016-01-26 DIAGNOSIS — Z7982 Long term (current) use of aspirin: Secondary | ICD-10-CM

## 2016-01-26 DIAGNOSIS — Z951 Presence of aortocoronary bypass graft: Secondary | ICD-10-CM

## 2016-01-26 DIAGNOSIS — I63432 Cerebral infarction due to embolism of left posterior cerebral artery: Secondary | ICD-10-CM | POA: Diagnosis present

## 2016-01-26 DIAGNOSIS — E7800 Pure hypercholesterolemia, unspecified: Secondary | ICD-10-CM | POA: Diagnosis present

## 2016-01-26 HISTORY — PX: LEFT ATRIAL APPENDAGE OCCLUSION: SHX173A

## 2016-01-26 LAB — POCT ACTIVATED CLOTTING TIME
ACTIVATED CLOTTING TIME: 219 s
ACTIVATED CLOTTING TIME: 162 s

## 2016-01-26 LAB — TYPE AND SCREEN
ABO/RH(D): O POS
Antibody Screen: NEGATIVE

## 2016-01-26 LAB — MRSA PCR SCREENING: MRSA BY PCR: NEGATIVE

## 2016-01-26 SURGERY — LEFT ATRIAL APPENDAGE OCCLUSION
Anesthesia: General

## 2016-01-26 MED ORDER — HEPARIN (PORCINE) IN NACL 2-0.9 UNIT/ML-% IJ SOLN
INTRAMUSCULAR | Status: AC
  Filled 2016-01-26: qty 500

## 2016-01-26 MED ORDER — WARFARIN - PHYSICIAN DOSING INPATIENT: Freq: Every day | Status: DC

## 2016-01-26 MED ORDER — FENTANYL CITRATE (PF) 100 MCG/2ML IJ SOLN: 25.0000 ug | INTRAMUSCULAR | Status: DC | PRN

## 2016-01-26 MED ORDER — WARFARIN VIDEO
Freq: Once | Status: AC
  Administered 2016-01-27: 1

## 2016-01-26 MED ORDER — PHENYLEPHRINE HCL 10 MG/ML IJ SOLN
10.0000 mg | INTRAVENOUS | Status: DC | PRN
  Administered 2016-01-26: 10 ug/min via INTRAVENOUS

## 2016-01-26 MED ORDER — ISOSORBIDE MONONITRATE ER 60 MG PO TB24
60.0000 mg | ORAL_TABLET | Freq: Every day | ORAL | Status: DC
  Administered 2016-01-27: 60 mg via ORAL
  Filled 2016-01-26: qty 1

## 2016-01-26 MED ORDER — ROCURONIUM BROMIDE 100 MG/10ML IV SOLN
INTRAVENOUS | Status: DC | PRN
Start: 2016-01-26 — End: 2016-01-26
  Administered 2016-01-26: 30 mg via INTRAVENOUS
  Administered 2016-01-26: 20 mg via INTRAVENOUS

## 2016-01-26 MED ORDER — COUMADIN BOOK
Freq: Once | Status: AC
  Administered 2016-01-26: 1
  Filled 2016-01-26 (×2): qty 1

## 2016-01-26 MED ORDER — ASPIRIN 81 MG PO TABS: 81.0000 mg | ORAL_TABLET | Freq: Every day | ORAL | Status: DC

## 2016-01-26 MED ORDER — ETOMIDATE 2 MG/ML IV SOLN
INTRAVENOUS | Status: DC | PRN
  Administered 2016-01-26: 8 mg via INTRAVENOUS

## 2016-01-26 MED ORDER — BISOPROLOL FUMARATE 5 MG PO TABS
10.0000 mg | ORAL_TABLET | Freq: Every day | ORAL | Status: DC
  Administered 2016-01-27: 10 mg via ORAL
  Filled 2016-01-26: qty 2

## 2016-01-26 MED ORDER — HEPARIN SODIUM (PORCINE) 1000 UNIT/ML IJ SOLN
INTRAMUSCULAR | Status: AC
  Filled 2016-01-26: qty 1

## 2016-01-26 MED ORDER — SODIUM CHLORIDE 0.45 % IV SOLN: INTRAVENOUS | Status: DC

## 2016-01-26 MED ORDER — WARFARIN SODIUM 5 MG PO TABS
5.0000 mg | ORAL_TABLET | Freq: Once | ORAL | Status: AC
  Administered 2016-01-26: 5 mg via ORAL
  Filled 2016-01-26: qty 1

## 2016-01-26 MED ORDER — LIDOCAINE HCL (PF) 1 % IJ SOLN
INTRAMUSCULAR | Status: DC | PRN
  Administered 2016-01-26: 6 mL

## 2016-01-26 MED ORDER — PROPOFOL 10 MG/ML IV BOLUS
INTRAVENOUS | Status: DC | PRN
Start: 2016-01-26 — End: 2016-01-26
  Administered 2016-01-26: 40 mg via INTRAVENOUS

## 2016-01-26 MED ORDER — ACETAMINOPHEN 500 MG PO TABS: 1000.0000 mg | ORAL_TABLET | Freq: Every day | ORAL | Status: DC | PRN

## 2016-01-26 MED ORDER — FUROSEMIDE 40 MG PO TABS
40.0000 mg | ORAL_TABLET | Freq: Every day | ORAL | Status: DC
  Administered 2016-01-27: 40 mg via ORAL
  Filled 2016-01-26: qty 1

## 2016-01-26 MED ORDER — IOPAMIDOL (ISOVUE-370) INJECTION 76%
INTRAVENOUS | Status: AC
  Filled 2016-01-26: qty 100

## 2016-01-26 MED ORDER — HYDRALAZINE HCL 25 MG PO TABS
25.0000 mg | ORAL_TABLET | Freq: Three times a day (TID) | ORAL | Status: DC
  Administered 2016-01-26 – 2016-01-27 (×2): 25 mg via ORAL
  Filled 2016-01-26 (×2): qty 1

## 2016-01-26 MED ORDER — ATORVASTATIN CALCIUM 80 MG PO TABS
80.0000 mg | ORAL_TABLET | Freq: Every day | ORAL | Status: DC
  Administered 2016-01-26: 80 mg via ORAL
  Filled 2016-01-26: qty 1

## 2016-01-26 MED ORDER — NITROGLYCERIN 0.4 MG SL SUBL: 0.4000 mg | SUBLINGUAL_TABLET | SUBLINGUAL | Status: DC | PRN

## 2016-01-26 MED ORDER — ASPIRIN 81 MG PO CHEW
81.0000 mg | CHEWABLE_TABLET | Freq: Every day | ORAL | Status: DC
  Administered 2016-01-27: 81 mg via ORAL
  Filled 2016-01-26: qty 1

## 2016-01-26 MED ORDER — ONDANSETRON HCL 4 MG/2ML IJ SOLN: 4.0000 mg | Freq: Four times a day (QID) | INTRAMUSCULAR | Status: DC | PRN

## 2016-01-26 MED ORDER — IOPAMIDOL (ISOVUE-370) INJECTION 76%
INTRAVENOUS | Status: DC | PRN
  Administered 2016-01-26: 40 mL via INTRA_ARTERIAL

## 2016-01-26 MED ORDER — SODIUM CHLORIDE 0.9 % IV SOLN: 250.0000 mL | INTRAVENOUS | Status: DC | PRN

## 2016-01-26 MED ORDER — SODIUM CHLORIDE 0.9% FLUSH: 3.0000 mL | INTRAVENOUS | Status: DC | PRN

## 2016-01-26 MED ORDER — ONDANSETRON HCL 4 MG/2ML IJ SOLN
INTRAMUSCULAR | Status: DC | PRN
  Administered 2016-01-26: 4 mg via INTRAVENOUS

## 2016-01-26 MED ORDER — SODIUM CHLORIDE 0.9 % IV SOLN
INTRAVENOUS | Status: AC
  Administered 2016-01-26: 10:00:00 via INTRAVENOUS

## 2016-01-26 MED ORDER — MORPHINE SULFATE (PF) 2 MG/ML IV SOLN
2.0000 mg | INTRAVENOUS | Status: DC | PRN
Start: 2016-01-26 — End: 2016-01-27

## 2016-01-26 MED ORDER — LACTATED RINGERS IV SOLN
INTRAVENOUS | Status: DC | PRN
Start: 2016-01-26 — End: 2016-01-26
  Administered 2016-01-26: 07:00:00 via INTRAVENOUS

## 2016-01-26 MED ORDER — LIDOCAINE 2% (20 MG/ML) 5 ML SYRINGE
INTRAMUSCULAR | Status: DC | PRN
  Administered 2016-01-26: 60 mg via INTRAVENOUS

## 2016-01-26 MED ORDER — SODIUM CHLORIDE 0.9 % IV SOLN: INTRAVENOUS | Status: DC

## 2016-01-26 MED ORDER — SUCCINYLCHOLINE CHLORIDE 200 MG/10ML IV SOSY
PREFILLED_SYRINGE | INTRAVENOUS | Status: DC | PRN
  Administered 2016-01-26: 100 mg via INTRAVENOUS

## 2016-01-26 MED ORDER — SUGAMMADEX SODIUM 200 MG/2ML IV SOLN
INTRAVENOUS | Status: DC | PRN
  Administered 2016-01-26: 200 mg via INTRAVENOUS

## 2016-01-26 MED ORDER — ACETAMINOPHEN-CODEINE #3 300-30 MG PO TABS: 1.0000 | ORAL_TABLET | ORAL | Status: DC | PRN

## 2016-01-26 MED ORDER — CEFAZOLIN SODIUM-DEXTROSE 2-4 GM/100ML-% IV SOLN
INTRAVENOUS | Status: AC
  Filled 2016-01-26: qty 100

## 2016-01-26 MED ORDER — SODIUM CHLORIDE 0.9% FLUSH
3.0000 mL | Freq: Two times a day (BID) | INTRAVENOUS | Status: DC
  Administered 2016-01-26: 3 mL via INTRAVENOUS

## 2016-01-26 MED ORDER — CEFAZOLIN SODIUM-DEXTROSE 2-4 GM/100ML-% IV SOLN
2.0000 g | INTRAVENOUS | Status: AC
  Administered 2016-01-26: 2 g via INTRAVENOUS
  Filled 2016-01-26: qty 100

## 2016-01-26 MED ORDER — LIDOCAINE HCL (PF) 1 % IJ SOLN
INTRAMUSCULAR | Status: AC
  Filled 2016-01-26: qty 30

## 2016-01-26 MED ORDER — FENTANYL CITRATE (PF) 250 MCG/5ML IJ SOLN
INTRAMUSCULAR | Status: DC | PRN
  Administered 2016-01-26: 50 ug via INTRAVENOUS

## 2016-01-26 MED ORDER — HEPARIN (PORCINE) IN NACL 2-0.9 UNIT/ML-% IJ SOLN
INTRAMUSCULAR | Status: DC | PRN
  Administered 2016-01-26: 2000 mL

## 2016-01-26 MED ORDER — HEPARIN SODIUM (PORCINE) 1000 UNIT/ML IJ SOLN
INTRAMUSCULAR | Status: DC | PRN
  Administered 2016-01-26: 3000 [IU] via INTRAVENOUS
  Administered 2016-01-26: 12000 [IU] via INTRAVENOUS

## 2016-01-26 SURGICAL SUPPLY — 15 items
CATH DIAG 6FR PIGTAIL ANGLED (CATHETERS) ×3 IMPLANT
CATH EXPO 5F MPA-1 (CATHETERS) ×3 IMPLANT
COVER DOME SNAP 22 D (MISCELLANEOUS) ×3 IMPLANT
ELECT DEFIB PAD ADLT CADENCE (PAD) ×3 IMPLANT
KIT HEART LEFT (KITS) ×3 IMPLANT
PACK CARDIAC CATHETERIZATION (CUSTOM PROCEDURE TRAY) ×3 IMPLANT
PAD DEFIB LIFELINK (PAD) ×3 IMPLANT
SHEATH INTRO CHECKFLO 16F 13 (SHEATH) ×1 IMPLANT
SHEATH INTRO CHECKFLO 16F 13CM (SHEATH) ×2
SHEATH PINNACLE 8F 10CM (SHEATH) ×3 IMPLANT
TRANSDUCER W/STOPCOCK (MISCELLANEOUS) ×3 IMPLANT
TUBING CIL FLEX 10 FLL-RA (TUBING) ×3 IMPLANT
WATCHMAN ACCESS DOUBLE CURVE (SHEATH) ×3 IMPLANT
WATCHMAN CLOSURE 27MM (Prosthesis & Implant Heart) ×3 IMPLANT
WIRE AMPLATZ WHISKJ .035X260CM (WIRE) ×3 IMPLANT

## 2016-01-26 NOTE — Progress Notes (Signed)
EPIC Encounter for ICM Monitoring  Patient Name: Lonnie BerkshireRobert L Shevchenko is a 70 y.o. male Date: 01/26/2016 Primary Care Physican: Cassell SmilesFUSCO,LAWRENCE J., MD Primary Cardiologist: Hochrein Electrophysiologist: Allred Dry Weight: unknown   Bi-V Pacing 75%  AT/AF Burden 22%      In the past month, have you:  1. Gained more than 2 pounds in a day or more than 5 pounds in a week? N/A  2. Had changes in your medications (with verification of current medications)? N/A  3. Had more shortness of breath than is usual for you? N/A  4. Limited your activity because of shortness of breath? N/A  5. Not been able to sleep because of shortness of breath? N/A  6. Had increased swelling in your feet or ankles? N/A  7. Had symptoms of dehydration (dizziness, dry mouth, increased thirst, decreased urine output) N/A  8. Had changes in sodium restriction? N/A  9. Been compliant with medication? N/A   ICM trend: 3 month view for 01/25/2016   ICM trend: 1 year view for 01/25/2016   Follow-up plan: ICM clinic phone appointment on 02/27/2016.  Patient scheduled for Watchman procedure today.   Transmission reviewed.  Since last ICM transmission on 01/18/2016, thoracic impedance continues below reference line which started on 01/17/2016 to 01/25/2016 suggesting fluid accumulation.    Copy of note sent to Dr Antoine PocheHochrein and Dr [name] for review and any recommendations.  Repeat transmission scheduled 02/01/2016.  Karie SodaLaurie S Short, RN, CCM 01/26/2016 10:50 AM   Hillis RangeJames Allred, MD  Karie SodaLaurie S Short, RN            Lets just see how he does post procedure    Patient has office appointment with Penne LashAmber Seiler,NP on 02/03/2016.   Next ICM transmission 02/27/2016.

## 2016-01-26 NOTE — Anesthesia Postprocedure Evaluation (Signed)
Anesthesia Post Note  Patient: Lonnie BerkshireRobert L Litle  Procedure(s) Performed: Procedure(s) (LRB): LEFT ATRIAL APPENDAGE OCCLUSION (N/A)  Patient location during evaluation: PACU Anesthesia Type: General Level of consciousness: awake and alert Pain management: pain level controlled Vital Signs Assessment: post-procedure vital signs reviewed and stable Respiratory status: spontaneous breathing, nonlabored ventilation, respiratory function stable and patient connected to nasal cannula oxygen Cardiovascular status: blood pressure returned to baseline and stable Postop Assessment: no signs of nausea or vomiting Anesthetic complications: no    Last Vitals:  Filed Vitals:   01/26/16 0610  BP: 127/92  Pulse: 61  Temp: 36.7 C  Resp: 18    Last Pain: There were no vitals filed for this visit.               Jimya Ciani S

## 2016-01-26 NOTE — H&P (Signed)
Lonnie [name] is a 70 y.o. Male who presents today for Watchman LAA  occlusion. Since last being seen in our clinic, the patient reports doing very well. He denies chest pain, palpitations, dyspnea, PND, orthopnea, nausea, vomiting, dizziness, syncope, edema, weight gain, or early satiety. He has not had ICD shocks.   Device History: STJ dual chamber ICD implanted 2015 for secondary prevention History of appropriate therapy: Yes History of AAD therapy: Yes   Past Medical History  Diagnosis Date  . High cholesterol   . Hypertension   . Constipation   . Reflux   . Complication of anesthesia     "Hard time waking me up" after sedation after dental procedure  . CAD (coronary artery disease)     LAD 95% proximal stenosis, D1 50-60% stenosis, the circumflex 40% stenosis, RCA subtotal stenosis.  . Cardiomyopathy, ischemic     EF was 30-35% by echo but 45-50% by cath. Most recent EF 15%.   . Mitral regurgitation     Secondary to papillary muscle rupture  . H/O cardiac arrest   . Atrial tachycardia (HCC)   . Ventricular fibrillation (HCC) 02/16/14    successfully defibrillation by his ICD   Past Surgical History  Procedure Laterality Date  . Stomach ulcer repair    . Tee without cardioversion N/A 02/04/2013    Procedure: TRANSESOPHAGEAL ECHOCARDIOGRAM (TEE); Surgeon: Vesta Mixer, MD; Location: [facility] ENDOSCOPY; Service: Cardiovascular; Laterality: N/A;  . Intraoperative transesophageal echocardiogram N/A 02/05/2013    Procedure: INTRAOPERATIVE TRANSESOPHAGEAL ECHOCARDIOGRAM; Surgeon: [name], MD; Location: [facility] OR; Service: Open Heart Surgery; Laterality: N/A;  . Endovein harvest of greater saphenous vein Right 02/05/2013    Procedure: ENDOVEIN HARVEST OF GREATER SAPHENOUS VEIN; Surgeon: [name], MD; Location: [facility] OR; Service: Open Heart Surgery; Laterality: Right;  .  Patent foramen ovale closure N/A 02/05/2013    Procedure: PATENT FORAMEN OVALE CLOSURE; Surgeon: [name], MD; Location: [facility] OR; Service: Open Heart Surgery; Laterality: N/A;  . Mitral valve replacement (mvr)/coronary artery bypass grafting (cabg) N/A 02/05/2013    Procedure: MITRAL VALVE REPLACEMENT (MVR)/CORONARY ARTERY BYPASS GRAFTING (CABG); Surgeon: [name], MD; Location: [facility] OR; Service: Open Heart Surgery; Laterality: N/A; x3 using right greater saphenous vein and left internal mammary.   . Coronary artery bypass graft    . Implantable cardioverter defibrillator implant  12/03/2013    STJ Fortify ICD implanted for secondary prevention by Dr Johney Frame  . Cardioversion N/A 01/25/2014    Procedure: CARDIOVERSION; Surgeon: Wendall Stade, MD; Location: [facility] ENDOSCOPY; Service: Cardiovascular; Laterality: N/A;  . Left heart catheterization with coronary angiogram N/A 02/03/2013    Procedure: LEFT HEART CATHETERIZATION WITH CORONARY ANGIOGRAM; Surgeon: Iran Ouch, MD; Location: [facility] CATH LAB; Service: Cardiovascular; Laterality: N/A;  . Left heart catheterization with coronary/graft angiogram N/A 11/01/2013    Procedure: LEFT HEART CATHETERIZATION WITH Isabel Caprice; Surgeon: Peter M Swaziland, MD; Location: [facility] CATH LAB; Service: Cardiovascular; Laterality: N/A;  . Implantable cardioverter defibrillator implant N/A 12/03/2013    Procedure: IMPLANTABLE CARDIOVERTER DEFIBRILLATOR IMPLANT; Surgeon: Gardiner Rhyme, MD; Location: [facility] CATH LAB; Service: Cardiovascular; Laterality: N/A;    Current Outpatient Prescriptions  Medication Sig Dispense Refill  . acetaminophen (TYLENOL) 500 MG tablet Take 500 mg by mouth every 6 (six) hours as needed for mild pain or moderate pain.     Marland Kitchen aspirin 81 MG tablet Take 81 mg by mouth daily.    Marland Kitchen atorvastatin (LIPITOR) 80 MG tablet Take 1 tablet (80  mg  total) by mouth daily at 6 PM. 30 tablet 11  . bisoprolol (ZEBETA) 10 MG tablet TAKE ONE TABLET BY MOUTH ONCE DAILY 30 tablet 6  . furosemide (LASIX) 40 MG tablet Take 1 tablet (40 mg total) by mouth daily. 30 tablet 11  . hydrALAZINE (APRESOLINE) 25 MG tablet Take 1 tablet (25 mg total) by mouth every 8 (eight) hours. 90 tablet 11  . isosorbide mononitrate (IMDUR) 30 MG 24 hr tablet Take 1 tablet (30 mg total) by mouth daily. 30 tablet 11  . nitroGLYCERIN (NITROSTAT) 0.4 MG SL tablet Place 1 tablet (0.4 mg total) under the tongue every 5 (five) minutes as needed for chest pain. 25 tablet 3   No current facility-administered medications for this visit.    Allergies: Review of patient's allergies indicates no known allergies.   Social History: Social History   Social History  . Marital Status: Married    Spouse Name: N/A  . Number of Children: N/A  . Years of Education: N/A   Occupational History  . Not on file.   Social History Main Topics  . Smoking status: Former Smoker    Types: Cigarettes    Quit date: 06/04/1974  . Smokeless tobacco: Not on file     Comment: 40 yrs ago  . Alcohol Use: 0.0 oz/week    0 Standard drinks or equivalent per week     Comment: week end - 1/5 of crown- 9/25 ocassionally-AJ  . Drug Use: No  . Sexual Activity: Not on file   Other Topics Concern  . Not on file   Social History Narrative   Drinks about five cups of caffeine a day.    Family History: Family History  Problem Relation Age of Onset  . Heart attack Mother [address]55  . Heart attack Brother     Review of Systems: All other systems reviewed and are otherwise negative except as noted above.   Physical Exam: Filed Vitals:   01/26/16 0610  BP: 127/92  Pulse: 61  Temp: 98 F (36.7 C)  Resp: 18   GEN- The patient is elderly appearing, alert and oriented x 3 today.   HEENT: normocephalic, atraumatic; sclera clear, conjunctiva pink; hearing intact; oropharynx clear; neck supple Lungs- Clear to ausculation bilaterally, normal work of breathing. No wheezes, rales, rhonchi Heart- Regular rate and rhythm GI- soft, non-tender, non-distended, bowel sounds present Extremities- no clubbing, cyanosis, or edema; DP/PT/radial pulses 2+ bilaterally MS- no significant deformity or atrophy Skin- warm and dry, no rash or lesion; ICD pocket well healed Psych- euthymic mood, full affect Neuro- strength and sensation are intact  ICD interrogation- reviewed in detail today, See PACEART report   Assessment and Plan:  1. Chronic systolic dysfunction euvolemic today Stable on an appropriate medical regimen  2. Atrial tachycardia/paroxysmal atrial fibrillation   Asymptomatic He has had prior SDH on Eliquis.  This patients CHA2DS2-VASc Score and unadjusted Ischemic Stroke Rate (% per year) is equal to 4.8 % stroke rate/year from a score of 4 which necessitates long term oral anticoagulation to prevent stroke. HasBled score is 3. Modified Rankin Score is 0. Unfortunately, with prior SDH, he is not a great candidate for long term anticoagulation. I do think he could tolerate short term OAC. Procedural risks for the Watchman implant have been reviewed with the patient including a 1% risk of stroke, 2% risk of perforation, 0.1% risk of device embolization. Given the patient's poor candidacy for long-term oral anticoagulation, ability to tolerate short term oral anticoagulation,  I have recommended the watchman left atrial appendage closure system.  TEE is reviewed. Pt seen by Dr Antoine Poche 4/17 who agrees with Watchman implantation.  3. VF No recent recurrence Continue medical therapy  Hillis Range MD, [facility] 01/26/2016 7:15 AM

## 2016-01-26 NOTE — Progress Notes (Signed)
Echocardiogram Echocardiogram Transesophageal has been performed.  Dorothey BasemanReel, Coltrane Tugwell M 01/26/2016, 9:41 AM

## 2016-01-26 NOTE — Progress Notes (Signed)
Site area: rt groin fv sheath Site Prior to Removal:  Level 0 Pressure Applied For:  25 minutes Manual:   yes Patient Status During Pull:  stable Post Pull Site:  Level  0 Post Pull Instructions Given:  yes Post Pull Pulses Present: yes Dressing Applied:  Small tegaderm Bedrest begins @  1045 Comments:

## 2016-01-26 NOTE — Anesthesia Procedure Notes (Signed)
Procedure Name: Intubation Date/Time: 01/26/2016 7:51 AM Performed by: Fabian NovemberSOLHEIM, Wilson Sample SALOMAN Pre-anesthesia Checklist: Patient identified, Patient being monitored, Timeout performed, Emergency Drugs available and Suction available Patient Re-evaluated:Patient Re-evaluated prior to inductionOxygen Delivery Method: Circle System Utilized Preoxygenation: Pre-oxygenation with 100% oxygen Intubation Type: IV induction Ventilation: Mask ventilation with difficulty, Two handed mask ventilation required and Oral airway inserted - appropriate to patient size Laryngoscope Size: Mac and 4 Grade View: Grade IV Tube type: Oral Tube size: 8.0 mm Number of attempts: 1 Airway Equipment and Method: Bougie stylet Placement Confirmation: ETT inserted through vocal cords under direct vision,  positive ETCO2 and breath sounds checked- equal and bilateral Secured at: 23 cm Tube secured with: Tape Dental Injury: Injury to lip  Difficulty Due To: Difficulty was anticipated, Difficult Airway- due to anterior larynx, Difficult Airway- due to limited oral opening, Difficult Airway- due to dentition and Difficult Airway- due to large tongue Future Recommendations: Recommend- induction with short-acting agent, and alternative techniques readily available Comments: Small cut noted on lower lip after oral airway was removed prior to DL.

## 2016-01-26 NOTE — Discharge Instructions (Addendum)
No driving for 4 days. No lifting over 5 lbs for 1 week. No sexual activity for 1 week. Keep procedure site clean & dry. If you notice increased pain, swelling, bleeding or pus, call/return!  You may shower, but no soaking baths/hot tubs/pools for 1 week.      Information on my medicine - Coumadin   (Warfarin)  This medication education was reviewed with me or my healthcare representative as part of my discharge preparation.  The pharmacist that spoke with me during my hospital stay was:  Lonnie [name], Lonnie [name], [facility]  Why was Coumadin prescribed for you? Coumadin was prescribed for you because you have a blood clot or a medical condition that can cause an increased risk of forming blood clots. Blood clots can cause serious health problems by blocking the flow of blood to the heart, lung, or brain. Coumadin can prevent harmful blood clots from forming. As a reminder your indication for Coumadin is:   Stroke Prevention Because Of Atrial Fibrillation - Watchman's Procedure What test will check on my response to Coumadin? While on Coumadin (warfarin) you will need to have an INR test regularly to ensure that your dose is keeping you in the desired range. The INR (international normalized ratio) number is calculated from the result of the laboratory test called prothrombin time (PT).  If an INR APPOINTMENT HAS NOT ALREADY BEEN MADE FOR YOU please schedule an appointment to have this lab work done by your health care provider within 7 days. Your INR goal is usually a number between:  2 to 3 or your provider may give you a more narrow range like 2-2.5.  Ask your health care provider during an office visit what your goal INR is.  What  do you need to  know  About  COUMADIN? Take Coumadin (warfarin) exactly as prescribed by your healthcare provider about the same time each day.  DO NOT stop taking without talking to the doctor who prescribed the medication.  Stopping without other blood clot prevention medication  to take the place of Coumadin may increase your risk of developing a new clot or stroke.  Get refills before you run out.  What do you do if you miss a dose? If you miss a dose, take it as soon as you remember on the same day then continue your regularly scheduled regimen the next day.  Do not take two doses of Coumadin at the same time.  Important Safety Information A possible side effect of Coumadin (Warfarin) is an increased risk of bleeding. You should call your healthcare provider right away if you experience any of the following: ? Bleeding from an injury or your nose that does not stop. ? Unusual colored urine (red or dark brown) or unusual colored stools (red or black). ? Unusual bruising for unknown reasons. ? A serious fall or if you hit your head (even if there is no bleeding).  Some foods or medicines interact with Coumadin (warfarin) and might alter your response to warfarin. To help avoid this: ? Eat a balanced diet, maintaining a consistent amount of Vitamin K. ? Notify your provider about major diet changes you plan to make. ? Avoid alcohol or limit your intake to 1 drink for women and 2 drinks for men per day. (1 drink is 5 oz. wine, 12 oz. beer, or 1.5 oz. liquor.)  Make sure that ANY health care provider who prescribes medication for you knows that you are taking Coumadin (warfarin).  Also make sure  the healthcare provider who is monitoring your Coumadin knows when you have started a new medication including herbals and non-prescription products.  Coumadin (Warfarin)  Major Drug Interactions  Increased Warfarin Effect Decreased Warfarin Effect  Alcohol (large quantities) Antibiotics (esp. Septra/Bactrim, Flagyl, Cipro) Amiodarone (Cordarone) Aspirin (ASA) Cimetidine (Tagamet) Megestrol (Megace) NSAIDs (ibuprofen, naproxen, etc.) Piroxicam (Feldene) Propafenone (Rythmol SR) Propranolol (Inderal) Isoniazid (INH) Posaconazole (Noxafil) Barbiturates  (Phenobarbital) Carbamazepine (Tegretol) Chlordiazepoxide (Librium) Cholestyramine (Questran) Griseofulvin Oral Contraceptives Rifampin Sucralfate (Carafate) Vitamin K   Coumadin (Warfarin) Major Herbal Interactions  Increased Warfarin Effect Decreased Warfarin Effect  Garlic Ginseng Ginkgo biloba Coenzyme Q10 Green tea St. Johns wort    Coumadin (Warfarin) FOOD Interactions  Eat a consistent number of servings per week of foods HIGH in Vitamin K (1 serving =  cup)  Collards (cooked, or boiled & drained) Kale (cooked, or boiled & drained) Mustard greens (cooked, or boiled & drained) Parsley *serving size only =  cup Spinach (cooked, or boiled & drained) Swiss chard (cooked, or boiled & drained) Turnip greens (cooked, or boiled & drained)  Eat a consistent number of servings per week of foods MEDIUM-HIGH in Vitamin K (1 serving = 1 cup)  Asparagus (cooked, or boiled & drained) Broccoli (cooked, boiled & drained, or raw & chopped) Brussel sprouts (cooked, or boiled & drained) *serving size only =  cup Lettuce, raw (green leaf, endive, romaine) Spinach, raw Turnip greens, raw & chopped   These websites have more information on Coumadin (warfarin):  http://www.king-russell.com/; https://www.hines.net/;

## 2016-01-26 NOTE — Discharge Summary (Signed)
ELECTROPHYSIOLOGY PROCEDURE DISCHARGE SUMMARY    Patient ID: Lonnie BerkshireRobert L Snelling,  MRN: [id number], DOB/AGE: 1946/08/26 70 y.o.  Admit date: 01/26/2016 Discharge date: 01/27/2016  Primary Care Physician: Cassell SmilesFUSCO,LAWRENCE J., MD Primary Cardiologist: Hochrein Electrophysiologist: Hillis RangeJames Allred, MD  Primary Discharge Diagnosis:  Paroxysmal atrial fibrillation status post LAA occluder insertion this admission  Secondary Discharge Diagnosis:  1.  Chronic systolic heart failure 2.  Hypertension 3.  Hyperlipidemia 4.  Ischemic cardiomyopathy s/p STJ ICD 5.  Prior VF arrest  6.  CAD   Procedures This Admission:  1.  Insertion of left atrial appendage occluder (Watchman) on 01/26/16 by Dr [name] and Dr Excell Seltzerooper.  This study demonstrated successful implantation of Watchman LAA occluder with no early apparent complications. TEE at time of procedure demonstrated no leak around device.   Brief HPI: Lonnie [name] is a 70 y.o. male with a history of paroxysmal atrial fibrillation.  They are felt to not be a candidate for long term Warfarin due to prior SDH. Risks, benefits, and alternatives to Watchman implant were reviewed with the patient who wished to proceed.  The patient underwent TEE prior to the procedure which demonstrated appendage [facility].    Hospital Course:  The patient was admitted and underwent Watchman insertion with details as outlined above.  They were monitored on telemetry overnight which demonstrated AV pacing.  Groin was without complication on the day of discharge.  The patient was examined and considered to be stable for discharge.  Bedside echo demonstrated stable device position without pericardial effusion.  Wound care and restrictions were reviewed with the patient.   This patients CHA2DS2-VASc Score and unadjusted Ischemic Stroke Rate (% per year) is equal to 4.8 % stroke rate/year from a score of 4 Above score calculated as 1 point each  if present [CHF, HTN, DM, Vascular=MI/PAD/Aortic Plaque, Age if 65-74, or Male] Above score calculated as 2 points each if present [Age > 75, or Stroke/TIA/TE]  The patient will be started on Warfarin at discharge with goal INR of 2-3.  Coumadin clinic follow up will be arranged in 4 days.  The patient will be seen by APP in 1 week for groin check, monitor for s/s of bleeding, CBC.  45 day TEE will also be scheduled.   Physical Exam: Filed Vitals:   01/26/16 2011 01/26/16 2121 01/26/16 2349 01/27/16 0317  BP: 116/77 113/73 116/71 122/77  Pulse: 64  69 66  Temp: 98.3 F (36.8 C)  98.2 F (36.8 C) 98.8 F (37.1 C)  TempSrc: Oral  Oral Oral  Resp: 23 18 15 21   Height:      Weight:      SpO2: 95%  95% 96%    GEN- The patient is elderly appearing, alert and oriented x 3 today.   HEENT: normocephalic, atraumatic; sclera clear, conjunctiva pink; hearing intact; oropharynx clear; neck supple Lungs- Clear to ausculation bilaterally, normal work of breathing.  No wheezes, rales, rhonchi Heart- Regular rate and rhythm, 1/6 systolic murmur GI- soft, non-tender, non-distended, bowel sounds present Extremities- no clubbing, cyanosis, or edema; DP/PT/radial pulses 2+ bilaterally, groin without hematoma/bruit MS- no significant deformity or atrophy Skin- warm and dry, no rash or lesion Psych- euthymic mood, full affect Neuro- strength and sensation are intact   Labs:   Lab Results  Component Value Date   WBC 7.2 01/23/2016   HGB 12.1* 01/23/2016   HCT 36.6* 01/23/2016   MCV 84.9 01/23/2016   PLT 195 01/23/2016  Recent Labs Lab 01/27/16 0311  NA 143  K 3.7  CL 104  CO2 29  BUN 19  CREATININE 1.46*  CALCIUM 8.6*  GLUCOSE 96     Discharge Medications:    Medication List    TAKE these medications        acetaminophen 500 MG tablet  Commonly known as:  TYLENOL  Take 1,000 mg by mouth daily as needed for mild pain.     aspirin 81 MG tablet  Take 81 mg by mouth  daily.     atorvastatin 80 MG tablet  Commonly known as:  LIPITOR  TAKE ONE TABLET BY MOUTH ONCE DAILY AT 6 PM     bisoprolol 10 MG tablet  Commonly known as:  ZEBETA  TAKE ONE TABLET BY MOUTH ONCE DAILY     furosemide 40 MG tablet  Commonly known as:  LASIX  TAKE ONE TABLET BY MOUTH ONCE DAILY     hydrALAZINE 25 MG tablet  Commonly known as:  APRESOLINE  TAKE ONE TABLET BY MOUTH EVERY 8 HOURS     isosorbide mononitrate 60 MG 24 hr tablet  Commonly known as:  IMDUR  Take 1 tablet (60 mg total) by mouth daily.     nitroGLYCERIN 0.4 MG SL tablet  Commonly known as:  NITROSTAT  Place 1 tablet (0.4 mg total) under the tongue every 5 (five) minutes as needed for chest pain.     warfarin 5 MG tablet  Commonly known as:  COUMADIN  Take 1 tablet (5 mg total) by mouth daily.        Disposition:  Discharge Instructions    Diet - low sodium heart healthy    Complete by:  As directed      Increase activity slowly    Complete by:  As directed           Follow-up Information    Follow up with Gypsy Balsam, NP On 02/03/2016.   Specialty:  Cardiology   Why:  at [facility] information:   [address] [place] [address] [phone]       Follow up with [facility] Office On 01/30/2016.   Specialty:  Cardiology   Why:  at 12:45PM for coumadin check    Contact information:   [address] [place] [address] [phone]      Duration of Discharge Encounter: Greater than 30 minutes including physician time.  Signed, Gypsy Balsam, NP 01/27/2016 7:21 AM  Patient seen, examined. Available data reviewed. Agree with findings, assessment, and plan as outlined by Gypsy Balsam, NP. The patient was independently interviewed and examined. Heart is regular rate and rhythm, lungs are clear, the right groin site is clear, there is no peripheral edema. Bedside echo shows no evidence of pericardial effusion. Follow-up as outlined  above. The patient is started on warfarin.  Tonny Bollman, M.D. 01/27/2016 9:23 AM

## 2016-01-26 NOTE — CV Procedure (Signed)
TEE was performed intraprocedurally to assist the the placement of a Watchman LAA occluder device. This involves 2D, 3D and color doppler images, and quantitative measurements. Please see the full TEE report for details.  Velva Molinari C. Inita Uram, MD, FACC Attending Cardiologist [facility]   

## 2016-01-26 NOTE — Progress Notes (Signed)
Lt radial a line removed and manual pressure held for 10 minutes. 3+ lt radial pulse. Small tegaderm dressing applied. Level 0

## 2016-01-26 NOTE — Transfer of Care (Signed)
Immediate Anesthesia Transfer of Care Note  Patient: Lonnie BerkshireRobert L [name]  Procedure(s) Performed: Procedure(s): LEFT ATRIAL APPENDAGE OCCLUSION (N/A)  Patient Location: PACU  Anesthesia Type:General  Level of Consciousness: awake, alert , oriented and patient cooperative  Airway & Oxygen Therapy: Patient Spontanous Breathing and Patient connected to nasal cannula oxygen  Post-op Assessment: Report given to RN and Post -op Vital signs reviewed and stable  Post vital signs: Reviewed and stable  Last Vitals:  Filed Vitals:   01/26/16 0610  BP: 127/92  Pulse: 61  Temp: 36.7 C  Resp: 18    Last Pain: There were no vitals filed for this visit.    Patients Stated Pain Goal: 3 (01/26/16 [id number])  Complications: No apparent anesthesia complications

## 2016-01-27 DIAGNOSIS — I480 Paroxysmal atrial fibrillation: Principal | ICD-10-CM

## 2016-01-27 LAB — BASIC METABOLIC PANEL
ANION GAP: 10 (ref 5–15)
BUN: 19 mg/dL (ref 6–20)
CALCIUM: 8.6 mg/dL — AB (ref 8.9–10.3)
CO2: 29 mmol/L (ref 22–32)
Chloride: 104 mmol/L (ref 101–111)
Creatinine, Ser: 1.46 mg/dL — ABNORMAL HIGH (ref 0.61–1.24)
GFR calc Af Amer: 54 mL/min — ABNORMAL LOW (ref >60)
GFR, EST NON AFRICAN AMERICAN: 47 mL/min — AB (ref >60)
Glucose, Bld: 96 mg/dL (ref 65–99)
POTASSIUM: 3.7 mmol/L (ref 3.5–5.1)
SODIUM: 143 mmol/L (ref 135–145)

## 2016-01-27 LAB — PROTIME-INR
INR: 1.12 (ref 0.00–1.49)
PROTHROMBIN TIME: 14.6 s (ref 11.6–15.2)

## 2016-01-27 MED ORDER — WARFARIN SODIUM 5 MG PO TABS: 5.0000 mg | ORAL_TABLET | Freq: Every day | ORAL | Status: DC

## 2016-01-27 NOTE — Plan of Care (Signed)
Problem: Education: Goal: Knowledge of [facility] Education information/materials will improve Outcome: Completed/Met Date Met:  01/27/16 Patient and wife viewed Coumadin video.

## 2016-01-30 ENCOUNTER — Ambulatory Visit (INDEPENDENT_AMBULATORY_CARE_PROVIDER_SITE_OTHER): Payer: 59 | Admitting: *Deleted

## 2016-01-30 DIAGNOSIS — I4891 Unspecified atrial fibrillation: Secondary | ICD-10-CM

## 2016-01-30 DIAGNOSIS — I480 Paroxysmal atrial fibrillation: Secondary | ICD-10-CM | POA: Diagnosis not present

## 2016-01-30 LAB — POCT INR: INR: 1.2

## 2016-02-03 ENCOUNTER — Encounter: Payer: Self-pay | Admitting: *Deleted

## 2016-02-03 ENCOUNTER — Ambulatory Visit (INDEPENDENT_AMBULATORY_CARE_PROVIDER_SITE_OTHER): Payer: 59 | Admitting: Nurse Practitioner

## 2016-02-03 ENCOUNTER — Encounter: Payer: Self-pay | Admitting: Nurse Practitioner

## 2016-02-03 VITALS — BP 110/68 | HR 76 | Ht 73.0 in | Wt 217.0 lb

## 2016-02-03 DIAGNOSIS — I10 Essential (primary) hypertension: Secondary | ICD-10-CM

## 2016-02-03 DIAGNOSIS — I5022 Chronic systolic (congestive) heart failure: Secondary | ICD-10-CM

## 2016-02-03 DIAGNOSIS — I480 Paroxysmal atrial fibrillation: Secondary | ICD-10-CM | POA: Diagnosis not present

## 2016-02-03 NOTE — Patient Instructions (Signed)
Medication Instructions:   Your physician recommends that you continue on your current medications as directed. Please refer to the Current Medication list given to you today.   If you need a refill on your cardiac medications before your next appointment, please call your pharmacy.  Labwork:  NONE ORDER TODAY   Testing/Procedures: SEE LETTER FOR TEE    Follow-Up: 8 WEEKS AFTER  TEE 03/15/16 WITH SEILER    Any Other Special Instructions Will Be Listed Below (If Applicable).

## 2016-02-03 NOTE — Progress Notes (Signed)
Electrophysiology Office Note Date: 02/03/2016  ID:  Lonnie [name], DOB [birth year], MRN [id number]  PCP: Cassell Smiles., MD Primary Cardiologist: Antoine Poche Electrophysiologist: Johney Frame  CC: Watchman follow up  Lonnie [name] is a 70 y.o. male seen today for Dr Johney Frame.  He presents today for follow up post Watchman impalnt.  Since discharge, the patient reports doing very well.  He denies chest pain, palpitations, dyspnea, PND, orthopnea, nausea, vomiting, dizziness, syncope, edema, weight gain, or early satiety. He has not had procedural related complications. No bleeding issues.   Past Medical History  Diagnosis Date  . High cholesterol   . Hypertension   . Constipation   . Reflux   . Complication of anesthesia     "Hard time waking me up" after sedation after dental procedure  . CAD (coronary artery disease)     LAD 95% proximal stenosis, D1 50-60% stenosis, the circumflex 40% stenosis, RCA subtotal stenosis.  . Cardiomyopathy, ischemic     EF was 30-35% by echo but 45-50% by cath.  Most recent EF 15%.    . Mitral regurgitation     Secondary to papillary muscle rupture  . H/O cardiac arrest   . Atrial tachycardia (HCC)   . Ventricular fibrillation (HCC) 02/16/14    successfully defibrillation by his ICD   Past Surgical History  Procedure Laterality Date  . Stomach ulcer repair    . Tee without cardioversion N/A 02/04/2013    Procedure: TRANSESOPHAGEAL ECHOCARDIOGRAM (TEE);  Surgeon: Vesta Mixer, MD;  Location: [facility] ENDOSCOPY;  Service: Cardiovascular;  Laterality: N/A;  . Intraoperative transesophageal echocardiogram N/A 02/05/2013    Procedure: INTRAOPERATIVE TRANSESOPHAGEAL ECHOCARDIOGRAM;  Surgeon: [name], MD;  Location: [facility] OR;  Service: Open Heart Surgery;  Laterality: N/A;  . Endovein harvest of greater saphenous vein Right 02/05/2013    Procedure: ENDOVEIN HARVEST OF GREATER SAPHENOUS VEIN;  Surgeon: [name], MD;  Location: [facility] OR;   Service: Open Heart Surgery;  Laterality: Right;  . Patent foramen ovale closure N/A 02/05/2013    Procedure: PATENT FORAMEN OVALE CLOSURE;  Surgeon: [name], MD;  Location: [facility] OR;  Service: Open Heart Surgery;  Laterality: N/A;  . Mitral valve replacement (mvr)/coronary artery bypass grafting (cabg) N/A 02/05/2013    Procedure: MITRAL VALVE REPLACEMENT (MVR)/CORONARY ARTERY BYPASS GRAFTING (CABG);  Surgeon: [name], MD;  Location: [facility] OR;  Service: Open Heart Surgery;  Laterality: N/A;  x3 using right greater saphenous vein and left internal mammary.   . Coronary artery bypass graft    . Implantable cardioverter defibrillator implant  12/03/2013    STJ Fortify ICD implanted for secondary prevention by Dr Johney Frame  . Cardioversion N/A 01/25/2014    Procedure: CARDIOVERSION;  Surgeon: Wendall Stade, MD;  Location: [facility] ENDOSCOPY;  Service: Cardiovascular;  Laterality: N/A;  . Left heart catheterization with coronary angiogram N/A 02/03/2013    Procedure: LEFT HEART CATHETERIZATION WITH CORONARY ANGIOGRAM;  Surgeon: Iran Ouch, MD;  Location: [facility] CATH LAB;  Service: Cardiovascular;  Laterality: N/A;  . Left heart catheterization with coronary/graft angiogram N/A 11/01/2013    Procedure: LEFT HEART CATHETERIZATION WITH Isabel Caprice;  Surgeon: Peter M Swaziland, MD;  Location: [facility] CATH LAB;  Service: Cardiovascular;  Laterality: N/A;  . Implantable cardioverter defibrillator implant N/A 12/03/2013    Procedure: IMPLANTABLE CARDIOVERTER DEFIBRILLATOR IMPLANT;  Surgeon: Gardiner Rhyme, MD;  Location: [facility] CATH LAB;  Service: Cardiovascular;  Laterality: N/A;  . Tee without cardioversion N/A 12/05/2015  Procedure: TRANSESOPHAGEAL ECHOCARDIOGRAM (TEE);  Surgeon: Lewayne Bunting, MD;  Location: [facility] ENDOSCOPY;  Service: Cardiovascular;  Laterality: N/A;  . Electrophysiologic study N/A 01/26/2016    Procedure: LEFT ATRIAL APPENDAGE OCCLUSION;  Surgeon: Hillis Range, MD;  Location:  [facility] INVASIVE CV LAB;  Service: Cardiovascular;  Laterality: N/A;    Current Outpatient Prescriptions  Medication Sig Dispense Refill  . acetaminophen (TYLENOL) 500 MG tablet Take 1,000 mg by mouth daily as needed for mild pain.     Marland Kitchen aspirin 81 MG tablet Take 81 mg by mouth daily.    Marland Kitchen atorvastatin (LIPITOR) 80 MG tablet TAKE ONE TABLET BY MOUTH ONCE DAILY AT 6 PM 30 tablet 4  . bisoprolol (ZEBETA) 10 MG tablet TAKE ONE TABLET BY MOUTH ONCE DAILY 30 tablet 4  . furosemide (LASIX) 40 MG tablet TAKE ONE TABLET BY MOUTH ONCE DAILY 30 tablet 4  . hydrALAZINE (APRESOLINE) 25 MG tablet TAKE ONE TABLET BY MOUTH EVERY 8 HOURS 90 tablet 4  . isosorbide mononitrate (IMDUR) 60 MG 24 hr tablet Take 1 tablet (60 mg total) by mouth daily. 90 tablet 3  . nitroGLYCERIN (NITROSTAT) 0.4 MG SL tablet Place 1 tablet (0.4 mg total) under the tongue every 5 (five) minutes as needed for chest pain. 25 tablet 3  . warfarin (COUMADIN) 5 MG tablet Take 1 tablet (5 mg total) by mouth daily. 30 tablet 0   No current facility-administered medications for this visit.    Allergies:   Review of patient's allergies indicates no known allergies.   Social History: Social History   Social History  . Marital Status: Married    Spouse Name: N/A  . Number of Children: N/A  . Years of Education: N/A   Occupational History  . Not on file.   Social History Main Topics  . Smoking status: Former Smoker    Types: Cigarettes    Quit date: 06/04/1974  . Smokeless tobacco: Not on file     Comment: 40 yrs ago  . Alcohol Use: 0.0 oz/week    0 Standard drinks or equivalent per week     Comment: week end - 1/5 of crown- 9/25 ocassionally-AJ  . Drug Use: No  . Sexual Activity: Not on file   Other Topics Concern  . Not on file   Social History Narrative   Drinks about five cups of caffeine a day.    Family History: Family History  Problem Relation Age of Onset  . Heart attack Mother 41  . Heart attack Brother      Review of Systems: All other systems reviewed and are otherwise negative except as noted above.   Physical Exam: VS:  BP 110/68 mmHg  Pulse 76  Ht 6\' 1"  (1.854 m)  Wt 217 lb (98.431 kg)  BMI 28.64 kg/m2 , BMI Body mass index is 28.64 kg/(m^2). Wt Readings from Last 3 Encounters:  02/03/16 217 lb (98.431 kg)  01/26/16 215 lb 6.2 oz (97.7 kg)  12/16/15 217 lb (98.431 kg)    GEN- The patient is well appearing, alert and oriented x 3 today.   HEENT: normocephalic, atraumatic; sclera clear, conjunctiva pink; hearing intact; oropharynx clear; neck supple  Lungs- Clear to ausculation bilaterally, normal work of breathing.  No wheezes, rales, rhonchi Heart- Regular rate and rhythm  GI- soft, non-tender, non-distended, bowel sounds present  Extremities- no clubbing, cyanosis, or edema; DP/PT/radial pulses 2+ bilaterally MS- no significant deformity or atrophy Skin- warm and dry, no rash or lesion  Psych-  euthymic mood, full affect Neuro- strength and sensation are intact   EKG:  EKG is not ordered today.  Recent Labs: 07/12/2015: TSH 1.492 07/13/2015: ALT 27 08/02/2015: Magnesium 2.0 11/28/2015: Brain Natriuretic Peptide 137.2* 01/23/2016: Hemoglobin 12.1*; Platelets 195 01/27/2016: BUN 19; Creatinine, Ser 1.46*; Potassium 3.7; Sodium 143    Other studies Reviewed: Additional studies/ records that were reviewed today include: hospital records   Assessment and Plan: 1.  Paroxysmal atrial fibrillation Doing well s/p watchman Continue Warfarin and ASA 81mg  daily (Warfarin to be followed in [place] coumadin clinic) TEE at 6 weeks to evaluate for device sealing and leaks  CHADS2VASC is 4  2.  Chronic systolic heart failure Euvolemic on exam Continue current therapy Normal device interrogation during recent hospitalization   3.  HTN Stable No change required today    Current medicines are reviewed at length with the patient today.   The patient does not have concerns  regarding his medicines.  The following changes were made today:  none  Labs/ tests ordered today include: TEE at 6 weeks post Watchman   Disposition:   Follow up with me after 6 week TEE     Signed, Gypsy BalsamAmber Cameryn Schum, NP 02/03/2016 9:49 AM   [facility] [address] [place] [address] [phone] (office) [phone] (fax)

## 2016-02-07 ENCOUNTER — Ambulatory Visit (INDEPENDENT_AMBULATORY_CARE_PROVIDER_SITE_OTHER): Payer: 59 | Admitting: *Deleted

## 2016-02-07 DIAGNOSIS — I480 Paroxysmal atrial fibrillation: Secondary | ICD-10-CM | POA: Diagnosis not present

## 2016-02-07 DIAGNOSIS — I4891 Unspecified atrial fibrillation: Secondary | ICD-10-CM

## 2016-02-07 LAB — POCT INR: INR: 2.7

## 2016-02-14 ENCOUNTER — Ambulatory Visit (INDEPENDENT_AMBULATORY_CARE_PROVIDER_SITE_OTHER): Payer: 59 | Admitting: *Deleted

## 2016-02-14 DIAGNOSIS — I480 Paroxysmal atrial fibrillation: Secondary | ICD-10-CM | POA: Diagnosis not present

## 2016-02-14 DIAGNOSIS — I4891 Unspecified atrial fibrillation: Secondary | ICD-10-CM

## 2016-02-14 LAB — POCT INR: INR: 3.2

## 2016-02-16 ENCOUNTER — Other Ambulatory Visit: Payer: Self-pay | Admitting: Nurse Practitioner

## 2016-02-27 ENCOUNTER — Ambulatory Visit (INDEPENDENT_AMBULATORY_CARE_PROVIDER_SITE_OTHER): Payer: 59 | Admitting: *Deleted

## 2016-02-27 DIAGNOSIS — I469 Cardiac arrest, cause unspecified: Secondary | ICD-10-CM

## 2016-02-27 DIAGNOSIS — Z95810 Presence of automatic (implantable) cardiac defibrillator: Secondary | ICD-10-CM | POA: Diagnosis not present

## 2016-02-27 DIAGNOSIS — I5022 Chronic systolic (congestive) heart failure: Secondary | ICD-10-CM

## 2016-02-27 NOTE — Progress Notes (Signed)
Remote ICD transmission.   

## 2016-02-27 NOTE — Progress Notes (Signed)
EPIC Encounter for ICM Monitoring  Patient Name: Lonnie BerkshireRobert L Giuliani is a 70 y.o. male Date: 02/27/2016 Primary Care Physican: Cassell SmilesFUSCO,LAWRENCE J., MD Primary Cardiologist: Hochrein Electrophysiologist: Allred Dry Weight: unknown  Bi-V Pacing 82%  AT/AF Burden 1.2%      In the past month, have you:  1. Gained more than 2 pounds in a day or more than 5 pounds in a week? No  2. Had changes in your medications (with verification of current medications)? No  3. Had more shortness of breath than is usual for you? No   4. Limited your activity because of shortness of breath? No   5. Not been able to sleep because of shortness of breath? No   6. Had increased swelling in your feet, ankles, legs or stomach area? No   7. Had symptoms of dehydration (dizziness, dry mouth, increased thirst, decreased urine output) No   8. Had changes in sodium restriction? No   9. Been compliant with medication? Yes   ICM trend: 3 month view for 02/27/2016   ICM trend: 1 year view for 02/27/2016   Follow-up plan: ICM clinic phone appointment 03/29/2016.    FLUID LEVELS: Corvue thoracic impedance trending along baseline suggesting stable fluid levels.    SYMPTOMS:  He reported he is doing very well.  Watchman procedure went well for him.  He denied any symptoms such as weight gain of 3 pounds overnight or 5 pounds within a week, SOB and/or lower extremity swelling. Encouraged to call for any fluid symptoms.   RECOMMENDATIONS: No changes today.    Karie SodaLaurie S Short, RN, CCM 02/27/2016 9:45 AM

## 2016-02-28 ENCOUNTER — Ambulatory Visit (INDEPENDENT_AMBULATORY_CARE_PROVIDER_SITE_OTHER): Payer: 59 | Admitting: *Deleted

## 2016-02-28 DIAGNOSIS — I480 Paroxysmal atrial fibrillation: Secondary | ICD-10-CM

## 2016-02-28 DIAGNOSIS — I4891 Unspecified atrial fibrillation: Secondary | ICD-10-CM

## 2016-02-28 LAB — CUP PACEART REMOTE DEVICE CHECK
Battery Remaining Longevity: 59 mo
Battery Remaining Percentage: 70 %
Brady Statistic AP VS Percent: 4.1 %
Brady Statistic AS VP Percent: 27 %
Brady Statistic AS VS Percent: 1 %
Brady Statistic RV Percent Paced: 82 %
Date Time Interrogation Session: 20170619060018
HIGH POWER IMPEDANCE MEASURED VALUE: 64 Ohm
HighPow Impedance: 64 Ohm
Implantable Lead Implant Date: 20150326
Implantable Lead Location: 753859
Lead Channel Impedance Value: 380 Ohm
Lead Channel Pacing Threshold Amplitude: 0.75 V
Lead Channel Sensing Intrinsic Amplitude: 4.2 mV
Lead Channel Setting Sensing Sensitivity: 0.5 mV
MDC IDC LEAD IMPLANT DT: 20150326
MDC IDC LEAD LOCATION: 753860
MDC IDC MSMT BATTERY VOLTAGE: 2.95 V
MDC IDC MSMT LEADCHNL RA IMPEDANCE VALUE: 380 Ohm
MDC IDC MSMT LEADCHNL RA PACING THRESHOLD PULSEWIDTH: 0.5 ms
MDC IDC MSMT LEADCHNL RV PACING THRESHOLD AMPLITUDE: 1.25 V
MDC IDC MSMT LEADCHNL RV PACING THRESHOLD PULSEWIDTH: 0.5 ms
MDC IDC MSMT LEADCHNL RV SENSING INTR AMPL: 11.7 mV
MDC IDC PG SERIAL: 7175865
MDC IDC SET LEADCHNL RA PACING AMPLITUDE: 2 V
MDC IDC SET LEADCHNL RV PACING AMPLITUDE: 2.5 V
MDC IDC SET LEADCHNL RV PACING PULSEWIDTH: 0.5 ms
MDC IDC STAT BRADY AP VP PERCENT: 56 %
MDC IDC STAT BRADY RA PERCENT PACED: 48 %

## 2016-02-28 LAB — POCT INR: INR: 2.5

## 2016-03-02 ENCOUNTER — Encounter: Payer: Self-pay | Admitting: Cardiology

## 2016-03-02 ENCOUNTER — Encounter (HOSPITAL_COMMUNITY): Payer: Self-pay | Admitting: Internal Medicine

## 2016-03-14 ENCOUNTER — Ambulatory Visit (INDEPENDENT_AMBULATORY_CARE_PROVIDER_SITE_OTHER): Payer: 59 | Admitting: *Deleted

## 2016-03-14 ENCOUNTER — Other Ambulatory Visit: Payer: Self-pay | Admitting: Nurse Practitioner

## 2016-03-14 DIAGNOSIS — I4891 Unspecified atrial fibrillation: Secondary | ICD-10-CM | POA: Diagnosis not present

## 2016-03-14 DIAGNOSIS — I480 Paroxysmal atrial fibrillation: Secondary | ICD-10-CM

## 2016-03-14 LAB — POCT INR: INR: 2.2

## 2016-03-15 ENCOUNTER — Ambulatory Visit (HOSPITAL_COMMUNITY)
Admission: RE | Admit: 2016-03-15 | Discharge: 2016-03-15 | Disposition: A | Discharge status: Home / Self Care | Payer: 59 | Source: Ambulatory Visit | Attending: Internal Medicine | Admitting: Internal Medicine

## 2016-03-15 ENCOUNTER — Encounter (HOSPITAL_COMMUNITY)
Admission: RE | Disposition: A | Discharge status: Home / Self Care | Payer: Self-pay | Source: Ambulatory Visit | Attending: Internal Medicine

## 2016-03-15 ENCOUNTER — Ambulatory Visit (HOSPITAL_BASED_OUTPATIENT_CLINIC_OR_DEPARTMENT_OTHER): Payer: 59

## 2016-03-15 ENCOUNTER — Encounter (HOSPITAL_COMMUNITY): Payer: Self-pay | Admitting: *Deleted

## 2016-03-15 DIAGNOSIS — I4891 Unspecified atrial fibrillation: Secondary | ICD-10-CM

## 2016-03-15 DIAGNOSIS — Z952 Presence of prosthetic heart valve: Secondary | ICD-10-CM

## 2016-03-15 DIAGNOSIS — I10 Essential (primary) hypertension: Secondary | ICD-10-CM | POA: Diagnosis not present

## 2016-03-15 DIAGNOSIS — Z7982 Long term (current) use of aspirin: Secondary | ICD-10-CM | POA: Insufficient documentation

## 2016-03-15 DIAGNOSIS — I2510 Atherosclerotic heart disease of native coronary artery without angina pectoris: Secondary | ICD-10-CM | POA: Insufficient documentation

## 2016-03-15 DIAGNOSIS — Z953 Presence of xenogenic heart valve: Secondary | ICD-10-CM | POA: Insufficient documentation

## 2016-03-15 DIAGNOSIS — Z7901 Long term (current) use of anticoagulants: Secondary | ICD-10-CM | POA: Diagnosis not present

## 2016-03-15 DIAGNOSIS — Z87891 Personal history of nicotine dependence: Secondary | ICD-10-CM | POA: Insufficient documentation

## 2016-03-15 DIAGNOSIS — Z8674 Personal history of sudden cardiac arrest: Secondary | ICD-10-CM | POA: Insufficient documentation

## 2016-03-15 DIAGNOSIS — Z8673 Personal history of transient ischemic attack (TIA), and cerebral infarction without residual deficits: Secondary | ICD-10-CM | POA: Insufficient documentation

## 2016-03-15 DIAGNOSIS — Z79899 Other long term (current) drug therapy: Secondary | ICD-10-CM | POA: Insufficient documentation

## 2016-03-15 DIAGNOSIS — Z95810 Presence of automatic (implantable) cardiac defibrillator: Secondary | ICD-10-CM | POA: Insufficient documentation

## 2016-03-15 DIAGNOSIS — E7800 Pure hypercholesterolemia, unspecified: Secondary | ICD-10-CM | POA: Insufficient documentation

## 2016-03-15 DIAGNOSIS — I255 Ischemic cardiomyopathy: Secondary | ICD-10-CM | POA: Insufficient documentation

## 2016-03-15 DIAGNOSIS — I469 Cardiac arrest, cause unspecified: Secondary | ICD-10-CM | POA: Diagnosis present

## 2016-03-15 DIAGNOSIS — Z09 Encounter for follow-up examination after completed treatment for conditions other than malignant neoplasm: Secondary | ICD-10-CM | POA: Diagnosis not present

## 2016-03-15 DIAGNOSIS — I519 Heart disease, unspecified: Secondary | ICD-10-CM | POA: Diagnosis present

## 2016-03-15 DIAGNOSIS — I63432 Cerebral infarction due to embolism of left posterior cerebral artery: Secondary | ICD-10-CM | POA: Diagnosis present

## 2016-03-15 DIAGNOSIS — I361 Nonrheumatic tricuspid (valve) insufficiency: Secondary | ICD-10-CM | POA: Diagnosis not present

## 2016-03-15 DIAGNOSIS — Z4509 Encounter for adjustment and management of other cardiac device: Secondary | ICD-10-CM | POA: Diagnosis present

## 2016-03-15 DIAGNOSIS — Z951 Presence of aortocoronary bypass graft: Secondary | ICD-10-CM | POA: Diagnosis not present

## 2016-03-15 HISTORY — PX: TEE WITHOUT CARDIOVERSION: SHX5443

## 2016-03-15 SURGERY — ECHOCARDIOGRAM, TRANSESOPHAGEAL
Anesthesia: Moderate Sedation

## 2016-03-15 MED ORDER — SODIUM CHLORIDE 0.9 % IV SOLN
INTRAVENOUS | Status: DC
  Administered 2016-03-15: 500 mL via INTRAVENOUS

## 2016-03-15 MED ORDER — BUTAMBEN-TETRACAINE-BENZOCAINE 2-2-14 % EX AERO
INHALATION_SPRAY | CUTANEOUS | Status: DC | PRN
  Administered 2016-03-15: 2 via TOPICAL

## 2016-03-15 MED ORDER — FENTANYL CITRATE (PF) 100 MCG/2ML IJ SOLN
INTRAMUSCULAR | Status: DC | PRN
  Administered 2016-03-15 (×2): 25 ug via INTRAVENOUS

## 2016-03-15 MED ORDER — LIDOCAINE VISCOUS 2 % MT SOLN
OROMUCOSAL | Status: AC
  Filled 2016-03-15: qty 15

## 2016-03-15 MED ORDER — FENTANYL CITRATE (PF) 100 MCG/2ML IJ SOLN
INTRAMUSCULAR | Status: AC
  Filled 2016-03-15: qty 2

## 2016-03-15 MED ORDER — MIDAZOLAM HCL 5 MG/ML IJ SOLN
INTRAMUSCULAR | Status: AC
  Filled 2016-03-15: qty 2

## 2016-03-15 MED ORDER — MIDAZOLAM HCL 10 MG/2ML IJ SOLN
INTRAMUSCULAR | Status: DC | PRN
  Administered 2016-03-15 (×2): 1 mg via INTRAVENOUS
  Administered 2016-03-15: 2 mg via INTRAVENOUS
  Administered 2016-03-15: 1 mg via INTRAVENOUS

## 2016-03-15 MED ORDER — LIDOCAINE VISCOUS 2 % MT SOLN
OROMUCOSAL | Status: DC | PRN
  Administered 2016-03-15: 1 via OROMUCOSAL

## 2016-03-15 NOTE — Discharge Instructions (Signed)

## 2016-03-15 NOTE — Progress Notes (Signed)
  Echocardiogram Echocardiogram Transesophageal has been performed.  Delcie RochENNINGTON, Lissandra Keil 03/15/2016, 9:44 AM

## 2016-03-15 NOTE — CV Procedure (Addendum)
TRANSESOPHAGEAL ECHOCARDIOGRAM (TEE) NOTE / POST OPERATIVE WATCHMAN  INDICATIONS: Post-op Watchman  PROCEDURE:   Informed consent was obtained prior to the procedure. The risks, benefits and alternatives for the procedure were discussed and the patient comprehended these risks.  Risks include, but are not limited to, cough, sore throat, vomiting, nausea, somnolence, esophageal and stomach trauma or perforation, bleeding, low blood pressure, aspiration, pneumonia, infection, trauma to the teeth and death.  After a procedural time-out, the patient was given 5 mg versed and 75 mcg fentanyl for moderate sedation.  The patient's heart rate, blood pressure, and oxygen saturation are monitored continuously during the procedure.The oropharynx was anesthetized 10 cc of topical 1% viscous lidocaine and 2 cetacaine sprays.  The transesophageal probe was inserted in the esophagus and stomach without difficulty and multiple views were obtained.  The patient was kept under observation until the patient left the procedure room.  The period of conscious sedation is 23 minutes, of which I was present face-to-face 100% of this time. The patient left the procedure room in stable condition.   Agitated microbubble saline contrast was not administered.  COMPLICATIONS:    There were no immediate complications.  Findings:  1. LEFT VENTRICLE: The left ventricular wall thickness is normal.  The left ventricular cavity is dilated in size. Wall motion is severely hypokinetic.  LVEF is 20-25%.  2. RIGHT VENTRICLE:  The right ventricle is normal in structure and function without any thrombus or masses.    3. LEFT ATRIUM:  The left atrium is dilated in size without any thrombus or masses.  There is not spontaneous echo contrast ("smoke") in the left atrium consistent with a low flow state.  4. LEFT ATRIAL APPENDAGE:  The left atrial appendage is noted to have a watchman device in place. The device measures 2.2 cm  fully expanded across the optium. There are 2 distinct high-velocity color jets noted inferior and superior to the device with a jet measuring 0.2 cm in width.  5. ATRIAL SEPTUM:  The atrial septum is noted to have a post-septal puncture ASD with moderate left to right flow by color doppler that is patent.  6. RIGHT ATRIUM:  The right atrium is normal in size and function without any thrombus or masses. AICD wires are noted.  7. MITRAL VALVE:  The mitral valve is bioprosthetic with normal leaflet motion and trivial regurgitation.  There were no vegetations or stenosis.  8. AORTIC VALVE:  The aortic valve is trileaflet, sclerotic and with  trivial regurgitation.  There were no vegetations or stenosis  9. TRICUSPID VALVE:  The tricuspid valve is normal in structure and function with Moderate regurgitation.  There were no vegetations or stenosis  10.  PULMONIC VALVE:  The pulmonic valve is normal in structure and function with no regurgitation.  There were no vegetations or stenosis.   11. AORTIC ARCH, ASCENDING AND DESCENDING AORTA:  There was grade 1 Myrtis Ser(Katz et. Al, 1992) atherosclerosis of the ascending aorta, aortic arch, or proximal descending aorta.  12. PULMONARY VEINS: Anomalous pulmonary venous return was not noted.  13. PERICARDIUM: The pericardium appeared normal and non-thickened.  There is no pericardial effusion.  IMPRESSION:   Closure of the LAA with a 27 mm Watchman device with a maximum extended measurement of 22 mm. There is a persistent superior jet adjacent to the coumadin ridge measuring <0.5 cm. High velocity flow is noted. There is a smaller 0.2 cm inferior jet as well. These jets are small, but  high velocity.   RECOMMENDATIONS:    1.  Follow-up with Dr. [name] / [name], NP for discussion regarding ongoing anticoagulation.  Time Spent Directly with the Patient:  30 minutes   Chrystie NoseKenneth C. Von Inscoe, MD, [facility]villeFACC Attending Cardiologist [facility]  03/15/2016, 9:30  AM

## 2016-03-15 NOTE — H&P (Signed)
ADMISSION HISTORY & PHYSICAL   Chief Complaint:  Follow-up Watchman TEE  Cardiologist: Dr. [name]  Primary Care Physician: Cassell SmilesFUSCO,LAWRENCE J., MD  HPI:  This is a 70 y.o. male with a past medical history significant for atrial fibrillation s/p Watchman LAA occluder placement. He is here for follow-up TEE per protocol.  PMHx:  Past Medical History  Diagnosis Date  . High cholesterol   . Hypertension   . Constipation   . Reflux   . Complication of anesthesia     "Hard time waking me up" after sedation after dental procedure  . CAD (coronary artery disease)     LAD 95% proximal stenosis, D1 50-60% stenosis, the circumflex 40% stenosis, RCA subtotal stenosis.  . Cardiomyopathy, ischemic     EF was 30-35% by echo but 45-50% by cath.  Most recent EF 15%.    . Mitral regurgitation     Secondary to papillary muscle rupture  . H/O cardiac arrest   . Atrial tachycardia (HCC)   . Ventricular fibrillation (HCC) 02/16/14    successfully defibrillation by his ICD    Past Surgical History  Procedure Laterality Date  . Stomach ulcer repair    . Tee without cardioversion N/A 02/04/2013    Procedure: TRANSESOPHAGEAL ECHOCARDIOGRAM (TEE);  Surgeon: Vesta MixerPhilip J Nahser, MD;  [facility];  Service: Cardiovascular;  Laterality: N/A;  . Intraoperative transesophageal echocardiogram N/A 02/05/2013    Procedure: INTRAOPERATIVE TRANSESOPHAGEAL ECHOCARDIOGRAM;  Surgeon: Loreli SlotSteven C Hendrickson, MD;  [facility];  Service: Open Heart Surgery;  Laterality: N/A;  . Endovein harvest of greater saphenous vein Right 02/05/2013    Procedure: ENDOVEIN HARVEST OF GREATER SAPHENOUS VEIN;  Surgeon: Loreli SlotSteven C Hendrickson, MD;  [facility];  Service: Open Heart Surgery;  Laterality: Right;  . Patent foramen ovale closure N/A 02/05/2013    Procedure: PATENT FORAMEN OVALE CLOSURE;  Surgeon: Loreli SlotSteven C Hendrickson, MD;  [facility];  Service: Open Heart Surgery;  Laterality: N/A;  . Mitral valve  replacement (mvr)/coronary artery bypass grafting (cabg) N/A 02/05/2013    Procedure: MITRAL VALVE REPLACEMENT (MVR)/CORONARY ARTERY BYPASS GRAFTING (CABG);  Surgeon: Loreli SlotSteven C Hendrickson, MD;  [facility];  Service: Open Heart Surgery;  Laterality: N/A;  x3 using right greater saphenous vein and left internal mammary.   . Coronary artery bypass graft    . Implantable cardioverter defibrillator implant  12/03/2013    STJ Fortify ICD implanted for secondary prevention by Dr [name]  . Cardioversion N/A 01/25/2014    Procedure: CARDIOVERSION;  Surgeon: Wendall StadePeter C Nishan, MD;  [facility];  Service: Cardiovascular;  Laterality: N/A;  . Left heart catheterization with coronary angiogram N/A 02/03/2013    Procedure: LEFT HEART CATHETERIZATION WITH CORONARY ANGIOGRAM;  Surgeon: Iran OuchMuhammad A Arida, MD;  [facility]: [facility] CATH LAB;  Service: Cardiovascular;  Laterality: N/A;  . Left heart catheterization with coronary/graft angiogram N/A 11/01/2013    Procedure: LEFT HEART CATHETERIZATION WITH Isabel CapriceORONARY/GRAFT ANGIOGRAM;  Surgeon: Peter M SwazilandJordan, MD;  [facility];  Service: Cardiovascular;  Laterality: N/A;  . Implantable cardioverter defibrillator implant N/A 12/03/2013    Procedure: IMPLANTABLE CARDIOVERTER DEFIBRILLATOR IMPLANT;  Surgeon: Gardiner RhymeJames D Allred, MD;  [facility];  Service: Cardiovascular;  Laterality: N/A;  . Tee without cardioversion N/A 12/05/2015    Procedure: TRANSESOPHAGEAL ECHOCARDIOGRAM (TEE);  Surgeon: Lewayne BuntingBrian S Crenshaw, MD;  [facility];  Service: Cardiovascular;  Laterality: N/A;  . Left atrial appendage occlusion N/A 01/26/2016    Procedure: LEFT  ATRIAL APPENDAGE OCCLUSION;  Surgeon: Hillis RangeJames Allred, MD;  [facility] IN[facility] LAB;  Service: Cardiovascular;  Laterality: N/A;    FAMHx:  Family History  Problem Relation Age of Onset  . Heart attack Mother 8455  . Heart attack Brother     SOCHx:   reports that he quit smoking about 41 years ago. His  smoking use included Cigarettes. He does not have any smokeless tobacco history on file. He reports that he drinks alcohol. He reports that he does not use illicit drugs.  ALLERGIES:  No Known Allergies  ROS: Pertinent items noted in HPI and remainder of comprehensive ROS otherwise negative.  HOME MEDS:   Medication List    ASK your doctor about these medications        acetaminophen 500 MG tablet  Commonly known as:  TYLENOL  Take 1,000 mg by mouth daily as needed for mild pain.     aspirin 81 MG tablet  Take 81 mg by mouth daily.     atorvastatin 80 MG tablet  Commonly known as:  LIPITOR  TAKE ONE TABLET BY MOUTH ONCE DAILY AT 6 PM     bisoprolol 10 MG tablet  Commonly known as:  ZEBETA  TAKE ONE TABLET BY MOUTH ONCE DAILY     furosemide 40 MG tablet  Commonly known as:  LASIX  TAKE ONE TABLET BY MOUTH ONCE DAILY     hydrALAZINE 25 MG tablet  Commonly known as:  APRESOLINE  TAKE ONE TABLET BY MOUTH EVERY 8 HOURS     isosorbide mononitrate 60 MG 24 hr tablet  Commonly known as:  IMDUR  Take 1 tablet (60 mg total) by mouth daily.     nitroGLYCERIN 0.4 MG SL tablet  Commonly known as:  NITROSTAT  Place 1 tablet (0.4 mg total) under the tongue every 5 (five) minutes as needed for chest pain.     warfarin 5 MG tablet  Commonly known as:  COUMADIN  Take as directed by Coumadin Clinic        LABS/IMAGING: Results for orders placed or performed in visit on 03/14/16 (from the past 48 hour(s))  POCT INR     Status: Normal   Collection Time: 03/14/16  2:35 PM  Result Value Ref Range   INR 2.2    No results found.  VITALS: Filed Vitals:   03/15/16 0735  BP: 98/68  Pulse: 92  Temp: 97.8 F (36.6 C)  Resp: 18    EXAM: General appearance: alert and no distress Lungs: clear to auscultation bilaterally Heart: irregularly irregular rhythm Extremities: extremities normal, atraumatic, no cyanosis or edema  IMPRESSION: Principal Problem:   Atrial  fibrillation (HCC) Active Problems:   History of MVR (tissue valve) 5/14   Cardiac arrest- out of hospital cardiac arrest,   Chronic systolic dysfunction of left ventricle   H/O Embolic stroke involving left posterior cerebral artery (HCC)   PLAN: 1. Plan for follow-up TEE today for Watchman LAA occluder device per protocol. Discussed risks/benefits/alternatives with him and he wishes to proceed.  Chrystie NoseKenneth C. Hilty, MD, [facility] Attending Cardiologist [facility]  Chrystie NoseKenneth C Hilty 03/15/2016, 7:56 AM

## 2016-03-29 ENCOUNTER — Telehealth: Payer: Self-pay

## 2016-03-29 NOTE — Telephone Encounter (Signed)
ICM call to patient and requested he send remote transmission.  He stated he is out of town and will send on 04/03/2016.

## 2016-03-29 NOTE — Telephone Encounter (Addendum)
Attempted to call back to patient due to he has an office appointment with Dr [name] on 04/02/2016.  Unable to reach and no voice mail is set up on either phone number.  ICM transmission will be rescheduled to 05/03/2016.

## 2016-04-02 ENCOUNTER — Ambulatory Visit (INDEPENDENT_AMBULATORY_CARE_PROVIDER_SITE_OTHER): Payer: 59 | Admitting: Internal Medicine

## 2016-04-02 ENCOUNTER — Encounter: Payer: Self-pay | Admitting: Internal Medicine

## 2016-04-02 VITALS — BP 108/68 | HR 88 | Ht 73.0 in | Wt 221.2 lb

## 2016-04-02 DIAGNOSIS — I255 Ischemic cardiomyopathy: Secondary | ICD-10-CM | POA: Diagnosis not present

## 2016-04-02 DIAGNOSIS — I472 Ventricular tachycardia, unspecified: Secondary | ICD-10-CM

## 2016-04-02 DIAGNOSIS — I5022 Chronic systolic (congestive) heart failure: Secondary | ICD-10-CM

## 2016-04-02 DIAGNOSIS — I4891 Unspecified atrial fibrillation: Secondary | ICD-10-CM | POA: Diagnosis not present

## 2016-04-02 MED ORDER — CLOPIDOGREL BISULFATE 75 MG PO TABS
75.0000 mg | ORAL_TABLET | Freq: Every day | ORAL | 3 refills | Status: DC
Start: 2016-04-02 — End: 2016-06-08

## 2016-04-02 NOTE — Patient Instructions (Signed)
Medication Instructions:  Your physician has recommended you make the following change in your medication: 1) Increase Aspirin to 325 mg daily 2) Stop Warfarin 3) Start Plavix 75 mg daily   Labwork: None ordered   Testing/Procedures: None ordered   Follow-Up: Your physician recommends that you schedule a follow-up appointment as scheduled    Any Other Special Instructions Will Be Listed Below (If Applicable).     If you need a refill on your cardiac medications before your next appointment, please call your pharmacy.

## 2016-04-02 NOTE — Progress Notes (Signed)
Electrophysiology Office Note Date: 04/02/2016  ID:  Lonnie [name], DOB [birth year], MRN [id number]  PCP: Cassell Smiles., MD Primary Cardiologist: Antoine Poche Electrophysiologist: Johney Frame  CC: Watchman follow up  Lonnie [name] is a 70 y.o. male seen today for Watchman follow up.  He has not had bleeding issues on warfarin. He does report some increased fatigue recently that he thinks may be due to the heat.  He denies chest pain, palpitations, dyspnea, PND, orthopnea, nausea, vomiting, dizziness, syncope, edema, weight gain, or early satiety.   Past Medical History:  Diagnosis Date  . Atrial tachycardia (HCC)   . CAD (coronary artery disease)    LAD 95% proximal stenosis, D1 50-60% stenosis, the circumflex 40% stenosis, RCA subtotal stenosis.  . Cardiomyopathy, ischemic    EF was 30-35% by echo but 45-50% by cath.  Most recent EF 15%.    . Complication of anesthesia    "Hard time waking me up" after sedation after dental procedure  . Constipation   . H/O cardiac arrest   . High cholesterol   . Hypertension   . Mitral regurgitation    Secondary to papillary muscle rupture  . Reflux   . Ventricular fibrillation (HCC) 02/16/14   successfully defibrillation by his ICD   Past Surgical History:  Procedure Laterality Date  . CARDIOVERSION N/A 01/25/2014   Procedure: CARDIOVERSION;  Surgeon: Wendall Stade, MD;  Location: [facility] ENDOSCOPY;  Service: Cardiovascular;  Laterality: N/A;  . CORONARY ARTERY BYPASS GRAFT    . ENDOVEIN HARVEST OF GREATER SAPHENOUS VEIN Right 02/05/2013   Procedure: ENDOVEIN HARVEST OF GREATER SAPHENOUS VEIN;  Surgeon: [name], MD;  Location[facility];  Service: Open Heart Surgery;  Laterality: Right;  . IMPLANTABLE CARDIOVERTER DEFIBRILLATOR IMPLANT  12/03/2013   STJ Fortify ICD implanted for secondary prevention by Dr Johney Frame  . IMPLANTABLE CARDIOVERTER DEFIBRILLATOR IMPLANT N/A 12/03/2013   Procedure: IMPLANTABLE CARDIOVERTER DEFIBRILLATOR IMPLANT;   Surgeon: Gardiner Rhyme, MD;  Location: [facility] CATH LAB;  Service: Cardiovascular;  Laterality: N/A;  . INTRAOPERATIVE TRANSESOPHAGEAL ECHOCARDIOGRAM N/A 02/05/2013   Procedure: INTRAOPERATIVE TRANSESOPHAGEAL ECHOCARDIOGRAM;  Surgeon: [name], MD;  Location: [facility] OR;  Service: Open Heart Surgery;  Laterality: N/A;  . LEFT ATRIAL APPENDAGE OCCLUSION N/A 01/26/2016   Procedure: LEFT ATRIAL APPENDAGE OCCLUSION;  Surgeon: Hillis Range, MD;  Location: [facility] INVASIVE CV LAB;  Service: Cardiovascular;  Laterality: N/A;  . LEFT HEART CATHETERIZATION WITH CORONARY ANGIOGRAM N/A 02/03/2013   Procedure: LEFT HEART CATHETERIZATION WITH CORONARY ANGIOGRAM;  Surgeon: Iran Ouch, MD;  Location: [facility] CATH LAB;  Service: Cardiovascular;  Laterality: N/A;  . LEFT HEART CATHETERIZATION WITH CORONARY/GRAFT ANGIOGRAM N/A 11/01/2013   Procedure: LEFT HEART CATHETERIZATION WITH Isabel Caprice;  Surgeon: Peter M Swaziland, MD;  Location: [facility] CATH LAB;  Service: Cardiovascular;  Laterality: N/A;  . MITRAL VALVE REPLACEMENT (MVR)/CORONARY ARTERY BYPASS GRAFTING (CABG) N/A 02/05/2013   Procedure: MITRAL VALVE REPLACEMENT (MVR)/CORONARY ARTERY BYPASS GRAFTING (CABG);  Surgeon: [name], MD;  Location: [facility] OR;  Service: Open Heart Surgery;  Laterality: N/A;  x3 using right greater saphenous vein and left internal mammary.   Marland Kitchen PATENT FORAMEN OVALE CLOSURE N/A 02/05/2013   Procedure: PATENT FORAMEN OVALE CLOSURE;  Surgeon: [name], MD;  Location: [facility] OR;  Service: Open Heart Surgery;  Laterality: N/A;  . stomach ulcer repair    . TEE WITHOUT CARDIOVERSION N/A 02/04/2013   Procedure: TRANSESOPHAGEAL ECHOCARDIOGRAM (TEE);  Surgeon: Vesta Mixer, MD;  Location: [facility]  ENDOSCOPY;  Service: Cardiovascular;  Laterality: N/A;  . TEE WITHOUT CARDIOVERSION N/A 12/05/2015   Procedure: TRANSESOPHAGEAL ECHOCARDIOGRAM (TEE);  Surgeon: Lewayne Bunting, MD;  Location: [facility] ENDOSCOPY;  Service: Cardiovascular;   Laterality: N/A;  . TEE WITHOUT CARDIOVERSION N/A 03/15/2016   Procedure: TRANSESOPHAGEAL ECHOCARDIOGRAM (TEE);  Surgeon: Chrystie Nose, MD;  Location: [facility] ENDOSCOPY;  Service: Cardiovascular;  Laterality: N/A;    Current Outpatient Prescriptions  Medication Sig Dispense Refill  . acetaminophen (TYLENOL) 500 MG tablet Take 1,000 mg by mouth daily as needed for mild pain.     Marland Kitchen aspirin 325 MG tablet Take 325 mg by mouth daily.    Marland Kitchen atorvastatin (LIPITOR) 80 MG tablet TAKE ONE TABLET BY MOUTH ONCE DAILY AT 6 PM 30 tablet 4  . bisoprolol (ZEBETA) 10 MG tablet TAKE ONE TABLET BY MOUTH ONCE DAILY 30 tablet 4  . furosemide (LASIX) 40 MG tablet TAKE ONE TABLET BY MOUTH ONCE DAILY 30 tablet 4  . hydrALAZINE (APRESOLINE) 25 MG tablet TAKE ONE TABLET BY MOUTH EVERY 8 HOURS 90 tablet 4  . isosorbide mononitrate (IMDUR) 60 MG 24 hr tablet Take 1 tablet (60 mg total) by mouth daily. 90 tablet 3  . nitroGLYCERIN (NITROSTAT) 0.4 MG SL tablet Place 1 tablet (0.4 mg total) under the tongue every 5 (five) minutes as needed for chest pain. 25 tablet 3  . clopidogrel (PLAVIX) 75 MG tablet Take 1 tablet (75 mg total) by mouth daily. 90 tablet 3   No current facility-administered medications for this visit.     Allergies:   Review of patient's allergies indicates no known allergies.   Social History: Social History   Social History  . Marital status: Married    Spouse name: N/A  . Number of children: N/A  . Years of education: N/A   Occupational History  . Not on file.   Social History Main Topics  . Smoking status: Former Smoker    Types: Cigarettes    Quit date: 06/04/1974  . Smokeless tobacco: Not on file     Comment: 40 yrs ago  . Alcohol use 0.0 oz/week     Comment: week end - 1/5 of crown- 9/25 ocassionally-AJ  . Drug use: No  . Sexual activity: Not on file   Other Topics Concern  . Not on file   Social History Narrative   Drinks about five cups of caffeine a day.    Family  History: Family History  Problem Relation Age of Onset  . Heart attack Mother 77  . Heart attack Brother     Review of Systems: All other systems reviewed and are otherwise negative except as noted above.   Physical Exam: VS:  BP 108/68   Pulse 88   Ht 6\' 1"  (1.854 m)   Wt 221 lb 3.2 oz (100.3 kg)   BMI 29.18 kg/m  , BMI Body mass index is 29.18 kg/m. Wt Readings from Last 3 Encounters:  04/02/16 221 lb 3.2 oz (100.3 kg)  03/15/16 217 lb (98.4 kg)  02/03/16 217 lb (98.4 kg)    GEN- The patient is well appearing, alert and oriented x 3 today.   HEENT: normocephalic, atraumatic; sclera clear, conjunctiva pink; hearing intact; oropharynx clear; neck supple  Lungs- Clear to ausculation bilaterally, normal work of breathing.  No wheezes, rales, rhonchi Heart- Irregular rate and rhythm  GI- soft, non-tender, non-distended, bowel sounds present  Extremities- no clubbing, cyanosis, or edema; DP/PT/radial pulses 2+ bilaterally MS- no significant deformity or  atrophy Skin- warm and dry, no rash or lesion  Psych- euthymic mood, full affect Neuro- strength and sensation are intact   EKG:  EKG is not ordered today.  Recent Labs: 07/12/2015: TSH 1.492 07/13/2015: ALT 27 08/02/2015: Magnesium 2.0 11/28/2015: Brain Natriuretic Peptide 137.2 01/23/2016: Hemoglobin 12.1; Platelets 195 01/27/2016: BUN 19; Creatinine, Ser 1.46; Potassium 3.7; Sodium 143    Assessment and Plan: 1.  Paroxysmal atrial fibrillation Doing well s/p watchman TEE at 6 weeks demonstrated good device seal with small jets Stop Warfarin Increase ASA to  today, start plavix  daily  CHADS2VASC is 4  2.  Chronic systolic heart failure Euvolemic on exam Continue current therapy Normal device interrogation during recent hospitalization   3.  HTN Stable No change required today  4.  Fatigue Potentially related to atrial fibrillation and increased burden although the patient isn't convinced that he was  better in may than he is now. Have offered AAD therapy to maintain SR but he would like to defer at this time Will follow up again in September as scheduled     Current medicines are reviewed at length with the patient today.   The patient does not have concerns regarding his medicines.  The following changes were made today:  Stop Warfarin, increase ASA to  daily, start Plavix  daily   Labs/ tests ordered today include: none   Disposition:   Follow up with me as scheduled, EP NP 07/2016   Signed, Hillis Range, MD  04/02/2016 4:04 PM   [facility] [address] [place] [address] [phone] (office) [phone] (fax)

## 2016-04-03 ENCOUNTER — Ambulatory Visit: Payer: Self-pay | Admitting: *Deleted

## 2016-04-03 DIAGNOSIS — I480 Paroxysmal atrial fibrillation: Secondary | ICD-10-CM

## 2016-04-03 LAB — CUP PACEART INCLINIC DEVICE CHECK
Battery Remaining Longevity: 62.4
Brady Statistic RA Percent Paced: 31 %
Brady Statistic RV Percent Paced: 57 %
Date Time Interrogation Session: 20170724191936
HighPow Impedance: 63 Ohm
Implantable Lead Implant Date: 20150326
Implantable Lead Implant Date: 20150326
Implantable Lead Location: 753859
Implantable Lead Location: 753860
Lead Channel Impedance Value: 387.5 Ohm
Lead Channel Impedance Value: 387.5 Ohm
Lead Channel Pacing Threshold Amplitude: 0.75 V
Lead Channel Pacing Threshold Amplitude: 0.75 V
Lead Channel Pacing Threshold Pulse Width: 0.5 ms
Lead Channel Pacing Threshold Pulse Width: 0.5 ms
Lead Channel Sensing Intrinsic Amplitude: 11.7 mV
Lead Channel Sensing Intrinsic Amplitude: 5 mV
Lead Channel Setting Pacing Amplitude: 2 V
Lead Channel Setting Pacing Amplitude: 2.5 V
Lead Channel Setting Pacing Pulse Width: 0.5 ms
Lead Channel Setting Sensing Sensitivity: 0.5 mV
Pulse Gen Serial Number: 7175865

## 2016-05-03 ENCOUNTER — Telehealth: Payer: Self-pay | Admitting: Cardiology

## 2016-05-03 NOTE — Telephone Encounter (Signed)
Spoke with pt and reminded pt of remote transmission that is due today. Pt verbalized understanding.   

## 2016-05-04 NOTE — Progress Notes (Signed)
No ICM remote transmission received for 05/03/2016.  Next ICM remote transmission scheduled for 05/09/2016.

## 2016-05-07 ENCOUNTER — Ambulatory Visit (INDEPENDENT_AMBULATORY_CARE_PROVIDER_SITE_OTHER): Payer: 59

## 2016-05-07 DIAGNOSIS — I5022 Chronic systolic (congestive) heart failure: Secondary | ICD-10-CM

## 2016-05-07 DIAGNOSIS — Z95810 Presence of automatic (implantable) cardiac defibrillator: Secondary | ICD-10-CM | POA: Diagnosis not present

## 2016-05-07 NOTE — Progress Notes (Signed)
EPIC Encounter for ICM Monitoring  Patient Name: Lonnie BerkshireRobert L [name] is a 70 y.o. male Date: 05/07/2016 Primary Care Physican: Cassell SmilesFUSCO,LAWRENCE J., MD Primary Cardiologist: Hochrein Electrophysiologist: Allred Dry Weight: Does not weigh V Pacing:  12% (decreased from 74% on 03/15/2016) AT/AF Burden > 99% (increased from 6.9% on 03/15/2016)  AMS Episodes 185  Mode Switch >99% (increased from 6.9% on 03/15/2016)     Heart Failure questions reviewed, pt asymptomatic.  He reported he feels fine at this time and denied any symptoms.    Thoracic impedance normal.  Recommendations: No changes.  Low sodium diet education provided.    Follow-up plan: ICM clinic phone appointment on 07/09/2016.  Office appointment with Dr [name] 06/08/2016.   Copy of ICM check sent to primary cardiologist and device physician.   ICM trend: 05/03/2016      Karie SodaLaurie S Chiann Goffredo, RN 05/07/2016 9:00 AM

## 2016-05-21 ENCOUNTER — Encounter: Payer: 59 | Admitting: Nurse Practitioner

## 2016-06-06 ENCOUNTER — Other Ambulatory Visit: Payer: Self-pay | Admitting: Cardiology

## 2016-06-06 NOTE — Telephone Encounter (Signed)
Rx(s) sent to pharmacy electronically.  

## 2016-06-08 ENCOUNTER — Encounter: Payer: Self-pay | Admitting: Internal Medicine

## 2016-06-08 ENCOUNTER — Ambulatory Visit (INDEPENDENT_AMBULATORY_CARE_PROVIDER_SITE_OTHER): Payer: Medicare Other | Admitting: Internal Medicine

## 2016-06-08 VITALS — BP 111/76 | HR 85 | Ht 73.0 in | Wt 229.0 lb

## 2016-06-08 DIAGNOSIS — I481 Persistent atrial fibrillation: Secondary | ICD-10-CM | POA: Diagnosis not present

## 2016-06-08 DIAGNOSIS — Z95810 Presence of automatic (implantable) cardiac defibrillator: Secondary | ICD-10-CM

## 2016-06-08 DIAGNOSIS — I4819 Other persistent atrial fibrillation: Secondary | ICD-10-CM

## 2016-06-08 DIAGNOSIS — I5022 Chronic systolic (congestive) heart failure: Secondary | ICD-10-CM

## 2016-06-08 NOTE — Progress Notes (Signed)
Electrophysiology Office Note Date: 06/08/2016  ID:  Lonnie [name], DOB 06-04-1946, MRN [id number]  PCP: Cassell SmilesFUSCO,LAWRENCE J., MD Primary Cardiologist: Antoine PocheHochrein Electrophysiologist: [name]  CC: Watchman follow up  Lonnie [name] is a 70 y.o. male seen today for follow up.  He is doing well.  He is unaware of his afib.  He is happy to no longer be on coumadin.  He denies chest pain, palpitations, dyspnea, PND, orthopnea, nausea, vomiting, dizziness, syncope, edema, weight gain, or early satiety.   Past Medical History:  Diagnosis Date  . Atrial tachycardia (HCC)   . CAD (coronary artery disease)    LAD 95% proximal stenosis, D1 50-60% stenosis, the circumflex 40% stenosis, RCA subtotal stenosis.  . Cardiomyopathy, ischemic    EF was 30-35% by echo but 45-50% by cath.  Most recent EF 15%.    . Complication of anesthesia    "Hard time waking me up" after sedation after dental procedure  . Constipation   . H/O cardiac arrest   . High cholesterol   . Hypertension   . Mitral regurgitation    Secondary to papillary muscle rupture  . Reflux   . Ventricular fibrillation (HCC) 02/16/14   successfully defibrillation by his ICD   Past Surgical History:  Procedure Laterality Date  . CARDIOVERSION N/A 01/25/2014   Procedure: CARDIOVERSION;  Surgeon: Wendall StadePeter C Nishan, MD;  [facility];  Service: Cardiovascular;  Laterality: N/A;  . CORONARY ARTERY BYPASS GRAFT    . ENDOVEIN HARVEST OF GREATER SAPHENOUS VEIN Right 02/05/2013   Procedure: ENDOVEIN HARVEST OF GREATER SAPHENOUS VEIN;  Surgeon: Loreli SlotSteven C Hendrickson, MD;  [facility];  Service: Open Heart Surgery;  Laterality: Right;  . IMPLANTABLE CARDIOVERTER DEFIBRILLATOR IMPLANT  12/03/2013   STJ Fortify ICD implanted for secondary prevention by Dr [name]  . IMPLANTABLE CARDIOVERTER DEFIBRILLATOR IMPLANT N/A 12/03/2013   Procedure: IMPLANTABLE CARDIOVERTER DEFIBRILLATOR IMPLANT;  Surgeon: Gardiner RhymeJames D Norissa Bartee, MD;  [facility];  Service: Cardiovascular;  Laterality: N/A;  . INTRAOPERATIVE TRANSESOPHAGEAL ECHOCARDIOGRAM N/A 02/05/2013   Procedure: INTRAOPERATIVE TRANSESOPHAGEAL ECHOCARDIOGRAM;  Surgeon: Loreli SlotSteven C Hendrickson, MD;  [facility];  Service: Open Heart Surgery;  Laterality: N/A;  . LEFT ATRIAL APPENDAGE OCCLUSION N/A 01/26/2016   Procedure: LEFT ATRIAL APPENDAGE OCCLUSION;  Surgeon: Hillis RangeJames Harini Dearmond, MD;  [facility]: [facility] INVASIVE CV LAB;  Service: Cardiovascular;  Laterality: N/A;  . LEFT HEART CATHETERIZATION WITH CORONARY ANGIOGRAM N/A 02/03/2013   Procedure: LEFT HEART CATHETERIZATION WITH CORONARY ANGIOGRAM;  Surgeon: Iran OuchMuhammad A Arida, MD;  Location: [facility] CATH LAB;  Service: Cardiovascular;  Laterality: N/A;  . LEFT HEART CATHETERIZATION WITH CORONARY/GRAFT ANGIOGRAM N/A 11/01/2013   Procedure: LEFT HEART CATHETERIZATION WITH Isabel CapriceORONARY/GRAFT ANGIOGRAM;  Surgeon: Peter M SwazilandJordan, MD;  [facility];  Service: Cardiovascular;  Laterality: N/A;  . MITRAL VALVE REPLACEMENT (MVR)/CORONARY ARTERY BYPASS GRAFTING (CABG) N/A 02/05/2013   Procedure: MITRAL VALVE REPLACEMENT (MVR)/CORONARY ARTERY BYPASS GRAFTING (CABG);  Surgeon: Loreli SlotSteven C Hendrickson, MD;  [facility];  Service: Open Heart Surgery;  Laterality: N/A;  x3 using right greater saphenous vein and left internal mammary.   Marland Kitchen. PATENT FORAMEN OVALE CLOSURE N/A 02/05/2013   Procedure: PATENT FORAMEN OVALE CLOSURE;  Surgeon: Loreli SlotSteven C Hendrickson, MD;  [facility];  Service: Open Heart Surgery;  Laterality: N/A;  . stomach ulcer repair    . TEE WITHOUT CARDIOVERSION N/A 02/04/2013   Procedure: TRANSESOPHAGEAL ECHOCARDIOGRAM (TEE);  Surgeon: Vesta MixerPhilip J Nahser, MD;  [facility];  Service: Cardiovascular;  Laterality: N/A;  . TEE WITHOUT CARDIOVERSION N/A 12/05/2015   Procedure: TRANSESOPHAGEAL ECHOCARDIOGRAM (TEE);  Surgeon: Lewayne Bunting, MD;  Location: [facility] ENDOSCOPY;  Service: Cardiovascular;  Laterality: N/A;  . TEE WITHOUT CARDIOVERSION N/A  03/15/2016   Procedure: TRANSESOPHAGEAL ECHOCARDIOGRAM (TEE);  Surgeon: Chrystie Nose, MD;  Location: [facility] ENDOSCOPY;  Service: Cardiovascular;  Laterality: N/A;    Current Outpatient Prescriptions  Medication Sig Dispense Refill  . acetaminophen (TYLENOL) 500 MG tablet Take 1,000 mg by mouth daily as needed for mild pain.     Marland Kitchen aspirin 325 MG tablet Take 325 mg by mouth daily.    Marland Kitchen atorvastatin (LIPITOR) 80 MG tablet TAKE ONE TABLET BY MOUTH ONCE DAILY AT 6 PM 30 tablet 4  . bisoprolol (ZEBETA) 10 MG tablet TAKE ONE TABLET BY MOUTH ONCE DAILY 30 tablet 6  . furosemide (LASIX) 40 MG tablet TAKE ONE TABLET BY MOUTH ONCE DAILY 30 tablet 4  . hydrALAZINE (APRESOLINE) 25 MG tablet TAKE ONE TABLET BY MOUTH EVERY 8 HOURS 90 tablet 4  . isosorbide mononitrate (IMDUR) 60 MG 24 hr tablet Take 1 tablet (60 mg total) by mouth daily. 90 tablet 3  . nitroGLYCERIN (NITROSTAT) 0.4 MG SL tablet Place 1 tablet (0.4 mg total) under the tongue every 5 (five) minutes as needed for chest pain. 25 tablet 3   No current facility-administered medications for this visit.     Allergies:   Review of patient's allergies indicates no known allergies.   Social History: Social History   Social History  . Marital status: Married    Spouse name: N/A  . Number of children: N/A  . Years of education: N/A   Occupational History  . Not on file.   Social History Main Topics  . Smoking status: Former Smoker    Types: Cigarettes    Quit date: 06/04/1974  . Smokeless tobacco: Never Used     Comment: 40 yrs ago  . Alcohol use 0.0 oz/week     Comment: week end - 1/5 of crown- 9/25 ocassionally-AJ  . Drug use: No  . Sexual activity: Not on file   Other Topics Concern  . Not on file   Social History Narrative   Drinks about five cups of caffeine a day.    Family History: Family History  Problem Relation Age of Onset  . Heart attack Mother 25  . Heart attack Brother     Review of Systems: All other systems  reviewed and are otherwise negative except as noted above.   Physical Exam: VS:  BP 111/76   Pulse 85   Ht 6\' 1"  (1.854 m)   Wt 229 lb (103.9 kg)   SpO2 97%   BMI 30.21 kg/m  , BMI Body mass index is 30.21 kg/m. Wt Readings from Last 3 Encounters:  06/08/16 229 lb (103.9 kg)  04/02/16 221 lb 3.2 oz (100.3 kg)  03/15/16 217 lb (98.4 kg)    GEN- The patient is well appearing, alert and oriented x 3 today.   HEENT: normocephalic, atraumatic; sclera clear, conjunctiva pink; hearing intact; oropharynx clear; neck supple  Lungs- Clear to ausculation bilaterally, normal work of breathing.  No wheezes, rales, rhonchi Heart- Irregular rate and rhythm  GI- soft, non-tender, non-distended, bowel sounds present  Extremities- no clubbing, cyanosis, or edema; DP/PT/radial pulses 2+ bilaterally MS- no significant deformity or atrophy Skin- warm and dry, no rash or lesion  Psych- euthymic mood, full affect Neuro- strength and sensation are intact  EKG:  EKG is not ordered today.  Recent Labs: 07/12/2015: TSH 1.492 07/13/2015: ALT 27 08/02/2015: Magnesium 2.0 11/28/2015: Brain Natriuretic Peptide 137.2 01/23/2016: Hemoglobin 12.1; Platelets 195 01/27/2016: BUN 19; Creatinine, Ser 1.46; Potassium 3.7; Sodium 143    Assessment and Plan: 1.  Persistent atrial fibrillation Doing well s/p watchman TEE at 6 weeks demonstrated good device seal with small jets Stop plavix 07/28/16 and continue ASA 325mg  daily long term. CHADS2VASC is 4 Given paucity of symptoms, I am reluctant to consider AAD therapy presently Device reprogrammed VVI today  2.  Chronic systolic heart failure Euvolemic on exam Continue current therapy  3.  HTN Stable No change required today   Disposition:   Follow up with Dr Antoine Poche as scheduled carelink Return to see me in 6 months   Signed, Hillis Range, MD  06/08/2016 10:34 AM   [facility] [address] [place]  [address] [phone] (office) [phone] (fax)

## 2016-06-08 NOTE — Patient Instructions (Addendum)
Medication Instructions:   Stop Plavix on July 28, 2016.    Continue all other medications.    Labwork: none  Testing/Procedures: none  Follow-Up: Your physician wants you to follow up in: 6 months.  You will receive a reminder letter in the mail one-two months in advance.  If you don't receive a letter, please call our office to schedule the follow up appointment   Any Other Special Instructions Will Be Listed Below (If Applicable).  If you need a refill on your cardiac medications before your next appointment, please call your pharmacy.

## 2016-06-15 ENCOUNTER — Encounter: Payer: Managed Care, Other (non HMO) | Admitting: Internal Medicine

## 2016-06-20 ENCOUNTER — Other Ambulatory Visit: Payer: Self-pay | Admitting: Cardiology

## 2016-06-26 ENCOUNTER — Other Ambulatory Visit: Payer: Self-pay | Admitting: Cardiology

## 2016-06-26 NOTE — Telephone Encounter (Signed)
Rx has been sent to the pharmacy electronically. ° °

## 2016-06-29 LAB — CUP PACEART INCLINIC DEVICE CHECK
HIGH POWER IMPEDANCE MEASURED VALUE: 64.125
Implantable Lead Implant Date: 20150326
Implantable Lead Implant Date: 20150326
Implantable Lead Location: 753860
Lead Channel Impedance Value: 387.5 Ohm
Lead Channel Pacing Threshold Amplitude: 1.25 V
Lead Channel Pacing Threshold Pulse Width: 0.8 ms
Lead Channel Sensing Intrinsic Amplitude: 11.7 mV
Lead Channel Sensing Intrinsic Amplitude: 5 mV
Lead Channel Setting Pacing Pulse Width: 0.8 ms
Lead Channel Setting Sensing Sensitivity: 0.5 mV
MDC IDC LEAD LOCATION: 753859
MDC IDC MSMT BATTERY REMAINING LONGEVITY: 68.4
MDC IDC MSMT LEADCHNL RV IMPEDANCE VALUE: 387.5 Ohm
MDC IDC SESS DTM: 20170929133440
MDC IDC SET LEADCHNL RV PACING AMPLITUDE: 2.5 V
MDC IDC STAT BRADY RA PERCENT PACED: 0.02 %
MDC IDC STAT BRADY RV PERCENT PACED: 14 %
Pulse Gen Serial Number: 7175865

## 2016-07-09 ENCOUNTER — Ambulatory Visit (INDEPENDENT_AMBULATORY_CARE_PROVIDER_SITE_OTHER): Payer: Medicare Other

## 2016-07-09 ENCOUNTER — Telehealth: Payer: Self-pay

## 2016-07-09 DIAGNOSIS — I5022 Chronic systolic (congestive) heart failure: Secondary | ICD-10-CM | POA: Diagnosis not present

## 2016-07-09 DIAGNOSIS — Z95810 Presence of automatic (implantable) cardiac defibrillator: Secondary | ICD-10-CM

## 2016-07-09 NOTE — Progress Notes (Signed)
EPIC Encounter for ICM Monitoring  Patient Name: Bethann BerkshireRobert L Drees is a 70 y.o. male Date: 07/09/2016 Primary Care Physican: Cassell SmilesFUSCO,LAWRENCE J., MD Primary Cardiologist: Hochrein Electrophysiologist: Allred Dry Weight:  unknown AT/AF Burden > 99% (increased from 6.9% on 03/15/2016)        Attempted ICM call and unable to reach. Transmission reviewed.   Thoracic impedance normal   Follow-up plan: ICM clinic phone appointment on 08/09/2016.  Copy of ICM check sent to device physician.   ICM trend: 07/09/2016     AT/AF   Karie SodaLaurie S Blaize Nipper, RN 07/09/2016 2:37 PM

## 2016-07-09 NOTE — Telephone Encounter (Signed)
Spoke to patient and wife.  Assisted wife with sending transmission but would not send.  Provided STJ tech service number and advised to call today or tomorrow.

## 2016-07-09 NOTE — Telephone Encounter (Signed)
Remote ICM transmission received.  Attempted patient call and mail boxes are full, no message left.

## 2016-08-09 ENCOUNTER — Ambulatory Visit (INDEPENDENT_AMBULATORY_CARE_PROVIDER_SITE_OTHER): Payer: Medicare Other

## 2016-08-09 DIAGNOSIS — Z95810 Presence of automatic (implantable) cardiac defibrillator: Secondary | ICD-10-CM | POA: Diagnosis not present

## 2016-08-09 DIAGNOSIS — I5022 Chronic systolic (congestive) heart failure: Secondary | ICD-10-CM

## 2016-08-09 NOTE — Progress Notes (Signed)
EPIC Encounter for ICM Monitoring  Patient Name: Lonnie BerkshireRobert L [name] is a 70 y.o. male Date: 08/09/2016 Primary Care Physican: Cassell SmilesFUSCO,LAWRENCE J., MD Primary Cardiologist: Hochrein Electrophysiologist: Allred Dry Weight:     unknown - does not weigh AT/AF Burden > 99% (increased from 6.9% on 03/15/2016)      Heart Failure questions reviewed, pt asymptomatic   Thoracic impedance normal   Recommendations: No changes.  Reinforced low salt food choices and limiting fluid intake to < 2 liters per day. Encouraged to call for fluid symptoms.    Follow-up plan: ICM clinic phone appointment on 09/11/2016.  Copy of ICM check sent to device physician.   ICM trend: 08/09/2016       Karie SodaLaurie S Lizandro Spellman, RN 08/09/2016 7:36 AM

## 2016-08-21 ENCOUNTER — Other Ambulatory Visit: Payer: Self-pay | Admitting: Cardiology

## 2016-08-21 NOTE — Telephone Encounter (Signed)
REFILL 

## 2016-09-11 ENCOUNTER — Telehealth: Payer: Self-pay

## 2016-09-11 ENCOUNTER — Ambulatory Visit (INDEPENDENT_AMBULATORY_CARE_PROVIDER_SITE_OTHER): Payer: Medicare Other | Admitting: *Deleted

## 2016-09-11 DIAGNOSIS — I472 Ventricular tachycardia, unspecified: Secondary | ICD-10-CM

## 2016-09-11 DIAGNOSIS — I5022 Chronic systolic (congestive) heart failure: Secondary | ICD-10-CM | POA: Diagnosis not present

## 2016-09-11 DIAGNOSIS — Z95810 Presence of automatic (implantable) cardiac defibrillator: Secondary | ICD-10-CM | POA: Diagnosis not present

## 2016-09-11 NOTE — Progress Notes (Signed)
EPIC Encounter for ICM Monitoring  Patient Name: Lonnie BerkshireRobert L [name] is a 71 y.o. male Date: 09/11/2016 Primary Care Physican: Cassell SmilesFUSCO,LAWRENCE J., MD Primary Cardiologist:Hochrein Electrophysiologist: Allred Dry Weight:unknown  AT/AF Burden >99%        Attempted ICM call and unable to reach.  Transmission reviewed.   Thoracic impedance normal   Follow-up plan: ICM clinic phone appointment on 10/12/2016.  Copy of ICM check sent to device physician.   3 month ICM trend : 09/11/2016   1 Year ICM trend:      Karie SodaLaurie S Short, RN 09/11/2016 12:09 PM

## 2016-09-11 NOTE — Telephone Encounter (Signed)
Remote ICM transmission received.  Attempted patient call and no answer 

## 2016-09-13 NOTE — Progress Notes (Signed)
Remote ICD transmission.   

## 2016-09-14 ENCOUNTER — Encounter: Payer: Self-pay | Admitting: Cardiology

## 2016-09-19 LAB — CUP PACEART REMOTE DEVICE CHECK
Battery Voltage: 2.95 V
Date Time Interrogation Session: 20180102115518
HIGH POWER IMPEDANCE MEASURED VALUE: 69 Ohm
HighPow Impedance: 69 Ohm
Implantable Lead Implant Date: 20150326
Implantable Lead Location: 753859
Lead Channel Pacing Threshold Pulse Width: 0.8 ms
Lead Channel Sensing Intrinsic Amplitude: 11.7 mV
Lead Channel Sensing Intrinsic Amplitude: 5 mV
Lead Channel Setting Pacing Pulse Width: 0.8 ms
Lead Channel Setting Sensing Sensitivity: 0.5 mV
MDC IDC LEAD IMPLANT DT: 20150326
MDC IDC LEAD LOCATION: 753860
MDC IDC MSMT BATTERY REMAINING LONGEVITY: 61 mo
MDC IDC MSMT BATTERY REMAINING PERCENTAGE: 63 %
MDC IDC MSMT LEADCHNL RA IMPEDANCE VALUE: 390 Ohm
MDC IDC MSMT LEADCHNL RV IMPEDANCE VALUE: 390 Ohm
MDC IDC MSMT LEADCHNL RV PACING THRESHOLD AMPLITUDE: 1.25 V
MDC IDC PG IMPLANT DT: 20150326
MDC IDC SET LEADCHNL RV PACING AMPLITUDE: 2.5 V
MDC IDC STAT BRADY RV PERCENT PACED: 15 %
Pulse Gen Serial Number: 7175865

## 2016-10-12 ENCOUNTER — Ambulatory Visit (INDEPENDENT_AMBULATORY_CARE_PROVIDER_SITE_OTHER): Payer: Medicare Other

## 2016-10-12 ENCOUNTER — Telehealth: Payer: Self-pay

## 2016-10-12 DIAGNOSIS — Z95810 Presence of automatic (implantable) cardiac defibrillator: Secondary | ICD-10-CM

## 2016-10-12 DIAGNOSIS — I5022 Chronic systolic (congestive) heart failure: Secondary | ICD-10-CM | POA: Diagnosis not present

## 2016-10-12 NOTE — Telephone Encounter (Signed)
Remote ICM transmission received.  Attempted patient call and no voice mail box set up to leave message

## 2016-10-12 NOTE — Progress Notes (Signed)
EPIC Encounter for ICM Monitoring  Patient Name: Lonnie BerkshireRobert L [name] is a 71 y.o. male Date: 10/12/2016 Primary Care Physican: Cassell SmilesFUSCO,LAWRENCE J., MD Primary Cardiologist:Hochrein Electrophysiologist: Allred Dry Weight:unknown  AT/AF Burden >99%         Attempted call to patient and unable to reach.  Transmission reviewed.   Thoracic impedance normal   Recommendations: NONE - Unable to reach patient   Follow-up plan: ICM clinic phone appointment on 12/17/2016 and office appt for defib check with Dr [name] 11/16/2016.  Copy of ICM check sent to device physician.   3 month ICM trend: 10/12/2016     1 Year ICM trend:      Karie SodaLaurie S Short, RN 10/12/2016 9:24 AM

## 2016-11-09 ENCOUNTER — Encounter: Payer: Medicare Other | Admitting: Internal Medicine

## 2016-11-16 ENCOUNTER — Ambulatory Visit (INDEPENDENT_AMBULATORY_CARE_PROVIDER_SITE_OTHER): Payer: Medicare Other | Admitting: Internal Medicine

## 2016-11-16 ENCOUNTER — Encounter: Payer: Self-pay | Admitting: Internal Medicine

## 2016-11-16 VITALS — BP 98/61 | HR 88 | Ht 73.0 in | Wt 236.0 lb

## 2016-11-16 DIAGNOSIS — I5022 Chronic systolic (congestive) heart failure: Secondary | ICD-10-CM | POA: Diagnosis not present

## 2016-11-16 DIAGNOSIS — I481 Persistent atrial fibrillation: Secondary | ICD-10-CM

## 2016-11-16 DIAGNOSIS — I472 Ventricular tachycardia, unspecified: Secondary | ICD-10-CM

## 2016-11-16 DIAGNOSIS — I4819 Other persistent atrial fibrillation: Secondary | ICD-10-CM

## 2016-11-16 DIAGNOSIS — I110 Hypertensive heart disease with heart failure: Secondary | ICD-10-CM | POA: Diagnosis not present

## 2016-11-16 DIAGNOSIS — I10 Essential (primary) hypertension: Secondary | ICD-10-CM | POA: Diagnosis not present

## 2016-11-16 MED ORDER — BISOPROLOL FUMARATE 10 MG PO TABS: 20.0000 mg | ORAL_TABLET | Freq: Every day | ORAL | 6 refills | Status: DC

## 2016-11-16 NOTE — Progress Notes (Signed)
Electrophysiology Office Note Date: 11/16/2016  ID:  BRIGHT SPIELMANN, DOB [birth year], MRN [id number]  PCP: Cassell Smiles., MD Primary Cardiologist: Antoine Poche Electrophysiologist: Johney Frame  CC: Watchman follow up  Lonnie [name] is a 71 y.o. male seen today for follow up.  He is doing well.  He is unaware of his afib.  He is happy to no longer be on coumadin.  He denies chest pain, palpitations, dyspnea, PND, orthopnea, nausea, vomiting, dizziness, syncope, edema, weight gain, or early satiety.   Past Medical History:  Diagnosis Date  . Atrial tachycardia (HCC)   . CAD (coronary artery disease)    LAD 95% proximal stenosis, D1 50-60% stenosis, the circumflex 40% stenosis, RCA subtotal stenosis.  . Cardiomyopathy, ischemic    EF was 30-35% by echo but 45-50% by cath.  Most recent EF 15%.    . Complication of anesthesia    "Hard time waking me up" after sedation after dental procedure  . Constipation   . H/O cardiac arrest   . High cholesterol   . Hypertension   . Mitral regurgitation    Secondary to papillary muscle rupture  . Reflux   . Ventricular fibrillation (HCC) 02/16/14   successfully defibrillation by his ICD   Past Surgical History:  Procedure Laterality Date  . CARDIOVERSION N/A 01/25/2014   Procedure: CARDIOVERSION;  Surgeon: Wendall Stade, MD;  Location: [facility] ENDOSCOPY;  Service: Cardiovascular;  Laterality: N/A;  . CORONARY ARTERY BYPASS GRAFT    . ENDOVEIN HARVEST OF GREATER SAPHENOUS VEIN Right 02/05/2013   Procedure: ENDOVEIN HARVEST OF GREATER SAPHENOUS VEIN;  Surgeon: [name], MD;  Location: [facility] OR;  Service: Open Heart Surgery;  Laterality: Right;  . IMPLANTABLE CARDIOVERTER DEFIBRILLATOR IMPLANT  12/03/2013   STJ Fortify ICD implanted for secondary prevention by Dr Johney Frame  . IMPLANTABLE CARDIOVERTER DEFIBRILLATOR IMPLANT N/A 12/03/2013   Procedure: IMPLANTABLE CARDIOVERTER DEFIBRILLATOR IMPLANT;  Surgeon: Gardiner Rhyme, MD;  Location: [facility] CATH  LAB;  Service: Cardiovascular;  Laterality: N/A;  . INTRAOPERATIVE TRANSESOPHAGEAL ECHOCARDIOGRAM N/A 02/05/2013   Procedure: INTRAOPERATIVE TRANSESOPHAGEAL ECHOCARDIOGRAM;  Surgeon: [name], MD;  Location: [facility] OR;  Service: Open Heart Surgery;  Laterality: N/A;  . LEFT ATRIAL APPENDAGE OCCLUSION N/A 01/26/2016   Procedure: LEFT ATRIAL APPENDAGE OCCLUSION;  Surgeon: Hillis Range, MD;  Location: [facility] INVASIVE CV LAB;  Service: Cardiovascular;  Laterality: N/A;  . LEFT HEART CATHETERIZATION WITH CORONARY ANGIOGRAM N/A 02/03/2013   Procedure: LEFT HEART CATHETERIZATION WITH CORONARY ANGIOGRAM;  Surgeon: Iran Ouch, MD;  Location: [facility] CATH LAB;  Service: Cardiovascular;  Laterality: N/A;  . LEFT HEART CATHETERIZATION WITH CORONARY/GRAFT ANGIOGRAM N/A 11/01/2013   Procedure: LEFT HEART CATHETERIZATION WITH Isabel Caprice;  Surgeon: Peter M Swaziland, MD;  Location: [facility] CATH LAB;  Service: Cardiovascular;  Laterality: N/A;  . MITRAL VALVE REPLACEMENT (MVR)/CORONARY ARTERY BYPASS GRAFTING (CABG) N/A 02/05/2013   Procedure: MITRAL VALVE REPLACEMENT (MVR)/CORONARY ARTERY BYPASS GRAFTING (CABG);  Surgeon: [name], MD;  Location: [facility] OR;  Service: Open Heart Surgery;  Laterality: N/A;  x3 using right greater saphenous vein and left internal mammary.   Marland Kitchen PATENT FORAMEN OVALE CLOSURE N/A 02/05/2013   Procedure: PATENT FORAMEN OVALE CLOSURE;  Surgeon: [name], MD;  Location: [facility] OR;  Service: Open Heart Surgery;  Laterality: N/A;  . stomach ulcer repair    . TEE WITHOUT CARDIOVERSION N/A 02/04/2013   Procedure: TRANSESOPHAGEAL ECHOCARDIOGRAM (TEE);  Surgeon: Vesta Mixer, MD;  Location: [facility] ENDOSCOPY;  Service: Cardiovascular;  Laterality: N/A;  . TEE WITHOUT CARDIOVERSION N/A 12/05/2015   Procedure: TRANSESOPHAGEAL ECHOCARDIOGRAM (TEE);  Surgeon: Lewayne Bunting, MD;  Location: [facility] ENDOSCOPY;  Service: Cardiovascular;  Laterality: N/A;  . TEE WITHOUT CARDIOVERSION N/A  03/15/2016   Procedure: TRANSESOPHAGEAL ECHOCARDIOGRAM (TEE);  Surgeon: Chrystie Nose, MD;  Location: [facility] ENDOSCOPY;  Service: Cardiovascular;  Laterality: N/A;    Current Outpatient Prescriptions  Medication Sig Dispense Refill  . acetaminophen (TYLENOL) 500 MG tablet Take 1,000 mg by mouth daily as needed for mild pain.     Marland Kitchen aspirin 325 MG tablet Take 325 mg by mouth daily.    Marland Kitchen atorvastatin (LIPITOR) 80 MG tablet TAKE ONE TABLET BY MOUTH ONCE DAILY AT 6 PM 30 tablet 6  . bisoprolol (ZEBETA) 10 MG tablet TAKE ONE TABLET BY MOUTH ONCE DAILY 30 tablet 6  . furosemide (LASIX) 40 MG tablet TAKE ONE TABLET BY MOUTH ONCE DAILY 30 tablet 4  . hydrALAZINE (APRESOLINE) 25 MG tablet TAKE ONE TABLET BY MOUTH EVERY 8 HOURS 270 tablet 1  . isosorbide mononitrate (IMDUR) 60 MG 24 hr tablet Take 1 tablet (60 mg total) by mouth daily. 90 tablet 3  . nitroGLYCERIN (NITROSTAT) 0.4 MG SL tablet Place 1 tablet (0.4 mg total) under the tongue every 5 (five) minutes as needed for chest pain. 25 tablet 3   No current facility-administered medications for this visit.     Allergies:   Patient has no known allergies.   Social History: Social History   Social History  . Marital status: Married    Spouse name: N/A  . Number of children: N/A  . Years of education: N/A   Occupational History  . Not on file.   Social History Main Topics  . Smoking status: Former Smoker    Types: Cigarettes    Quit date: 06/04/1974  . Smokeless tobacco: Never Used     Comment: 40 yrs ago  . Alcohol use 0.0 oz/week     Comment: week end - 1/5 of crown- 9/25 ocassionally-AJ  . Drug use: No  . Sexual activity: Not on file   Other Topics Concern  . Not on file   Social History Narrative   Drinks about five cups of caffeine a day.    Family History: Family History  Problem Relation Age of Onset  . Heart attack Mother 24  . Heart attack Brother     Review of Systems: All other systems reviewed and are otherwise  negative except as noted above.   Physical Exam: VS:  BP 98/61   Pulse 88   Ht 6\' 1"  (1.854 m)   Wt 236 lb (107 kg)   BMI 31.14 kg/m  , BMI Body mass index is 31.14 kg/m. Wt Readings from Last 3 Encounters:  11/16/16 236 lb (107 kg)  06/08/16 229 lb (103.9 kg)  04/02/16 221 lb 3.2 oz (100.3 kg)    GEN- The patient is well appearing, alert and oriented x 3 today.   HEENT: normocephalic, atraumatic; sclera clear, conjunctiva pink; hearing intact; oropharynx clear; neck supple  Lungs- Clear to ausculation bilaterally, normal work of breathing.  No wheezes, rales, rhonchi Heart- Irregular rate and rhythm  GI- soft, non-tender, non-distended, bowel sounds present  Extremities- no clubbing, cyanosis, or edema; DP/PT/radial pulses 2+ bilaterally MS- no significant deformity or atrophy Skin- warm and dry, no rash or lesion , ICD pocket is well healed Psych- euthymic mood, full affect Neuro- strength and sensation are intact  Recent Labs: 11/28/2015:  Brain Natriuretic Peptide 137.2 01/23/2016: Hemoglobin 12.1; Platelets 195 01/27/2016: BUN 19; Creatinine, Ser 1.46; Potassium 3.7; Sodium 143    Assessment and Plan: 1.  Persistent atrial fibrillation Doing well s/p watchman On ASA only doing doing well  High V rate episodes are due to rapidly conducing afib/ atypical atrial flutter I have increased bisoprolol to 20mg  daily today  2.  Chronic systolic heart failure Euvolemic on exam Continue current therapy Bmet, BNP today Followed in ICM device clinic Normal ICD function See Pace Art report No changes today  3.  HTN Stable No change required today bmet   Disposition:   Follow up with Dr Antoine PocheHochrein as scheduled Merlin Return to see me in 12 months   Signed, Hillis RangeJames Nyeisha Goodall, MD  11/16/2016 12:48 PM   [facility] [address] [place] [id number] [phone] (office) [phone] (fax)

## 2016-11-16 NOTE — Patient Instructions (Signed)
Medication Instructions:   Increase Bisoprolol to 20mg  daily.  Continue all other medications.    Labwork:  BMET, BNP - orders given today.   Office will contact with results via phone or letter.    Testing/Procedures: none  Follow-Up:  Your physician wants you to follow up in:  1 year.  You will receive a reminder letter in the mail one-two months in advance.  If you don't receive a letter, please call our office to schedule the follow up appointment - Dr. [name].  Your physician wants you to follow up in: 6 months.  You will receive a reminder letter in the mail one-two months in advance.  If you don't receive a letter, please call our office to schedule the follow up appointment - Hochrein  Any Other Special Instructions Will Be Listed Below (If Applicable). Remote monitoring is used to monitor your Pacemaker of ICD from home. This monitoring reduces the number of office visits required to check your device to one time per year. It allows us to keep an eye on the functioning of your device to ensure it is working properly. You are scheduled for a device check from home on 02/18/2017.  You may send your transmission at any time that day. If you have a wireless device, the transmission will be sent automatically. After your physician reviews your transmission, you will receive a postcard with your next transmission date.  If you need a refill on your cardiac medications before your next appointment, please call your pharmacy.

## 2016-11-19 ENCOUNTER — Telehealth: Payer: Self-pay

## 2016-11-19 NOTE — Telephone Encounter (Signed)
-----   Message from Eustace MooreLydia M Anderson, LPN sent at 4/09/81193/08/2017  9:59 AM EDT -----   ----- Message ----- From: Hillis RangeJames Allred, MD Sent: 11/19/2016   8:04 AM To: Elfredia NevinsLawrence Fusco, MD, Lesle ChrisAngela G Hill, LPN  Results reviewed.  Inocencio HomesGayle, please inform pt of result. I will route to primary care also.

## 2016-11-19 NOTE — Telephone Encounter (Signed)
PATIENT NOTIFIED.

## 2016-11-20 LAB — CUP PACEART INCLINIC DEVICE CHECK
Brady Statistic RA Percent Paced: 0 %
HighPow Impedance: 68.625
Implantable Lead Implant Date: 20150326
Implantable Lead Location: 753859
Implantable Pulse Generator Implant Date: 20150326
Lead Channel Impedance Value: 387.5 Ohm
Lead Channel Pacing Threshold Pulse Width: 0.8 ms
Lead Channel Pacing Threshold Pulse Width: 0.8 ms
Lead Channel Sensing Intrinsic Amplitude: 5 mV
Lead Channel Setting Pacing Amplitude: 2.5 V
Lead Channel Setting Pacing Pulse Width: 0.8 ms
Lead Channel Setting Sensing Sensitivity: 0.5 mV
MDC IDC LEAD IMPLANT DT: 20150326
MDC IDC LEAD LOCATION: 753860
MDC IDC MSMT LEADCHNL RV IMPEDANCE VALUE: 387.5 Ohm
MDC IDC MSMT LEADCHNL RV PACING THRESHOLD AMPLITUDE: 1.25 V
MDC IDC MSMT LEADCHNL RV PACING THRESHOLD AMPLITUDE: 1.25 V
MDC IDC MSMT LEADCHNL RV SENSING INTR AMPL: 11.7 mV
MDC IDC PG SERIAL: 7175865
MDC IDC SESS DTM: 20180309181714
MDC IDC STAT BRADY RV PERCENT PACED: 16 %

## 2016-11-25 ENCOUNTER — Other Ambulatory Visit: Payer: Self-pay | Admitting: Cardiology

## 2016-12-03 ENCOUNTER — Encounter: Payer: Medicare Other | Admitting: Internal Medicine

## 2016-12-11 ENCOUNTER — Encounter: Payer: Medicare Other | Admitting: *Deleted

## 2016-12-11 ENCOUNTER — Telehealth: Payer: Self-pay | Admitting: Cardiology

## 2016-12-11 NOTE — Telephone Encounter (Signed)
Confirmed remote transmission w/ pt wife. Pt wife stated that pt home monitor is automatic and he doesn't have to do anything. I explained to pt wife that ICM clinic nurse was moving a couple patients up from Monday 12-17-16 to today and therefore it would not be automatic. She informed me to call pt. Attempted to call pt number. No answer and unable to leave a message.

## 2016-12-17 ENCOUNTER — Ambulatory Visit (INDEPENDENT_AMBULATORY_CARE_PROVIDER_SITE_OTHER): Payer: Medicare Other

## 2016-12-17 ENCOUNTER — Telehealth: Payer: Self-pay

## 2016-12-17 DIAGNOSIS — I5022 Chronic systolic (congestive) heart failure: Secondary | ICD-10-CM

## 2016-12-17 DIAGNOSIS — Z95810 Presence of automatic (implantable) cardiac defibrillator: Secondary | ICD-10-CM

## 2016-12-17 NOTE — Progress Notes (Signed)
EPIC Encounter for ICM Monitoring  Patient Name: Lonnie [name] is a 71 y.o. male Date: 12/17/2016 Primary Care Physican: Glo Herring, MD Primary Cardiologist:Hochrein Electrophysiologist: Allred Dry Weight:unknown  AT/AF Burden >99%        Attempted call to patient and unable to reach. Transmission reviewed.    Thoracic impedance normal.  Prescribed dosage: Furosemide 40 mg 1 tablet daily.  Labs: 11/16/2016 Creatinine 1.57, BUN 26, Potassium 4.3, Sodium 143, EGFR 44-53  Recommendations: NONE - Unable to reach patient   Follow-up plan: ICM clinic phone appointment on 01/17/2017.    Copy of ICM check sent to device physician.   3 month ICM trend: 12/17/2016   1 Year ICM trend:      Rosalene Billings, RN 12/17/2016 8:32 AM

## 2016-12-17 NOTE — Telephone Encounter (Signed)
Remote ICM transmission received.  Attempted patient call to home and cell and no voice mail set up.

## 2016-12-18 ENCOUNTER — Other Ambulatory Visit: Payer: Self-pay

## 2016-12-18 MED ORDER — ATORVASTATIN CALCIUM 80 MG PO TABS: 80.0000 mg | ORAL_TABLET | Freq: Every day | ORAL | 1 refills | Status: DC

## 2016-12-18 NOTE — Telephone Encounter (Signed)
Rx(s) sent to pharmacy electronically.  

## 2016-12-24 ENCOUNTER — Other Ambulatory Visit: Payer: Self-pay | Admitting: Cardiology

## 2016-12-25 NOTE — Telephone Encounter (Signed)
Rx(s) sent to pharmacy electronically.  

## 2017-01-17 ENCOUNTER — Ambulatory Visit (INDEPENDENT_AMBULATORY_CARE_PROVIDER_SITE_OTHER): Payer: Medicare Other

## 2017-01-17 ENCOUNTER — Telehealth: Payer: Self-pay

## 2017-01-17 DIAGNOSIS — I5022 Chronic systolic (congestive) heart failure: Secondary | ICD-10-CM

## 2017-01-17 DIAGNOSIS — Z95810 Presence of automatic (implantable) cardiac defibrillator: Secondary | ICD-10-CM | POA: Diagnosis not present

## 2017-01-17 NOTE — Telephone Encounter (Signed)
Remote ICM transmission received.  Attempted patient call and no answer or voice mail set up. 

## 2017-01-17 NOTE — Progress Notes (Signed)
EPIC Encounter for ICM Monitoring  Patient Name: DNAIEL VOLLER is a 71 y.o. male Date: 01/17/2017 Primary Care Physican: Redmond School, MD Primary Cardiologist:Hochrein Electrophysiologist: Allred Dry Weight:unknown  AT/AF Burden >99%       Attempted call to patient and unable to reach.  Transmission reviewed.    Thoracic impedance normal but has been abnormal suggesting fluid accumulation in the last month.  Prescribed dosage: Furosemide 40 mg 1 tablet daily.  Labs: 11/16/2016 Creatinine 1.57, BUN 26, Potassium 4.3, Sodium 143, EGFR 44-53  Recommendations: NONE - Unable to reach patient   Follow-up plan: ICM clinic phone appointment on 02/18/2017.    Copy of ICM check sent to device physician.   3 month ICM trend: 01/17/2017   1 Year ICM trend:      Rosalene Billings, RN 01/17/2017 9:17 AM

## 2017-01-22 ENCOUNTER — Other Ambulatory Visit: Payer: Self-pay | Admitting: Cardiology

## 2017-01-23 NOTE — Telephone Encounter (Signed)
Rx has been sent to the pharmacy electronically. ° °

## 2017-02-06 ENCOUNTER — Other Ambulatory Visit: Payer: Self-pay | Admitting: Cardiology

## 2017-02-06 NOTE — Telephone Encounter (Signed)
Rx has been sent to the pharmacy electronically. ° °

## 2017-02-18 ENCOUNTER — Ambulatory Visit (INDEPENDENT_AMBULATORY_CARE_PROVIDER_SITE_OTHER): Payer: Medicare Other | Admitting: *Deleted

## 2017-02-18 DIAGNOSIS — Z95810 Presence of automatic (implantable) cardiac defibrillator: Secondary | ICD-10-CM | POA: Diagnosis not present

## 2017-02-18 DIAGNOSIS — I255 Ischemic cardiomyopathy: Secondary | ICD-10-CM

## 2017-02-18 DIAGNOSIS — I5022 Chronic systolic (congestive) heart failure: Secondary | ICD-10-CM | POA: Diagnosis not present

## 2017-02-18 NOTE — Progress Notes (Signed)
EPIC Encounter for ICM Monitoring  Patient Name: Lonnie [name] is a 71 y.o. male Date: 02/18/2017 Primary Care Physican: Redmond School, MD Primary Cardiologist:Hochrein Electrophysiologist: Allred Dry Weight:unknown  AT/AF Burden 42%         Spoke with wife. Heart Failure questions reviewed, pt asymptomatic    Thoracic impedance normal.  Prescribed dosage: Furosemide 40 mg 1 tablet daily.  Labs: 11/16/2016 Creatinine 1.57, BUN 26, Potassium 4.3, Sodium 143, EGFR 44-53  Recommendations: No changes.   Encouraged to call for fluid symptoms.  Follow-up plan: ICM clinic phone appointment on 03/21/2017.    Copy of ICM check sent to device physician.   3 month ICM trend: 02/18/2017      1 Year ICM trend:      Rosalene Billings, RN 02/18/2017 3:39 PM

## 2017-02-18 NOTE — Progress Notes (Signed)
Remote ICD transmission.   

## 2017-02-20 ENCOUNTER — Encounter: Payer: Self-pay | Admitting: Cardiology

## 2017-02-20 LAB — CUP PACEART REMOTE DEVICE CHECK
HighPow Impedance: 69 Ohm
HighPow Impedance: 69 Ohm
Implantable Lead Location: 753859
Lead Channel Impedance Value: 380 Ohm
Lead Channel Pacing Threshold Pulse Width: 0.8 ms
Lead Channel Sensing Intrinsic Amplitude: 2.7 mV
Lead Channel Setting Pacing Amplitude: 2.5 V
Lead Channel Setting Pacing Pulse Width: 0.8 ms
MDC IDC LEAD IMPLANT DT: 20150326
MDC IDC LEAD IMPLANT DT: 20150326
MDC IDC LEAD LOCATION: 753860
MDC IDC MSMT BATTERY REMAINING LONGEVITY: 54 mo
MDC IDC MSMT BATTERY REMAINING PERCENTAGE: 58 %
MDC IDC MSMT BATTERY VOLTAGE: 2.93 V
MDC IDC MSMT LEADCHNL RV IMPEDANCE VALUE: 440 Ohm
MDC IDC MSMT LEADCHNL RV PACING THRESHOLD AMPLITUDE: 1.25 V
MDC IDC MSMT LEADCHNL RV SENSING INTR AMPL: 11.4 mV
MDC IDC PG IMPLANT DT: 20150326
MDC IDC SESS DTM: 20180611080019
MDC IDC SET LEADCHNL RV SENSING SENSITIVITY: 0.5 mV
MDC IDC STAT BRADY RV PERCENT PACED: 63 %
Pulse Gen Serial Number: 7175865

## 2017-03-21 ENCOUNTER — Telehealth: Payer: Self-pay

## 2017-03-21 ENCOUNTER — Ambulatory Visit (INDEPENDENT_AMBULATORY_CARE_PROVIDER_SITE_OTHER): Payer: Medicare Other

## 2017-03-21 DIAGNOSIS — I5022 Chronic systolic (congestive) heart failure: Secondary | ICD-10-CM

## 2017-03-21 DIAGNOSIS — Z95810 Presence of automatic (implantable) cardiac defibrillator: Secondary | ICD-10-CM | POA: Diagnosis not present

## 2017-03-21 NOTE — Progress Notes (Signed)
EPIC Encounter for ICM Monitoring  Patient Name: Lonnie [name] is a 71 y.o. male Date: 03/21/2017 Primary Care Physican: Redmond School, MD Primary Cardiologist:Hochrein Electrophysiologist: Allred Dry Weight:unknown  AT/AF Burden >99%           Attempted call to patient and unable to reach.  Transmission reviewed.    Thoracic impedance normal but was abnormal suggesting fluid accumulation from 03/10/2017 to 03/17/2017.  Prescribed dosage: Furosemide 40 mg 1 tablet daily.  Labs: 11/16/2016 Creatinine 1.57, BUN 26, Potassium 4.3, Sodium 143, EGFR 44-53  Recommendations: NONE - Unable to reach patient   Follow-up plan: ICM clinic phone appointment on 04/23/2017.  Office appointment scheduled 06/11/2017 with Dr. Percival Spanish.  Copy of ICM check sent to device physician.   3 month ICM trend: 03/21/2017   1 Year ICM trend:      Rosalene Billings, RN 03/21/2017 11:28 AM

## 2017-03-21 NOTE — Telephone Encounter (Signed)
Remote ICM transmission received.  Attempted patient call and voice mail box has not been set up 

## 2017-04-15 ENCOUNTER — Other Ambulatory Visit: Payer: Self-pay | Admitting: Cardiology

## 2017-04-16 NOTE — Telephone Encounter (Signed)
REFILL 

## 2017-04-21 ENCOUNTER — Other Ambulatory Visit: Payer: Self-pay | Admitting: Cardiology

## 2017-04-23 ENCOUNTER — Ambulatory Visit (INDEPENDENT_AMBULATORY_CARE_PROVIDER_SITE_OTHER): Payer: Medicare Other

## 2017-04-23 DIAGNOSIS — I5022 Chronic systolic (congestive) heart failure: Secondary | ICD-10-CM | POA: Diagnosis not present

## 2017-04-23 DIAGNOSIS — Z95810 Presence of automatic (implantable) cardiac defibrillator: Secondary | ICD-10-CM | POA: Diagnosis not present

## 2017-04-23 NOTE — Progress Notes (Signed)
EPIC Encounter for ICM Monitoring  Patient Name: Lonnie [name] is a 71 y.o. male Date: 04/23/2017 Primary Care Physican: Redmond School, MD Primary Cardiologist:Hochrein Electrophysiologist: Allred Dry Weight:unknown  AT/AF Burden 25%       Heart Failure questions reviewed, pt asymptomatic.   Thoracic impedance normal but was abnormal suggesting fluid accumulation from 03/26/2017 to 04/01/2017 and 04/19/2017 to 04/21/2017.  Prescribed dosage: Furosemide 40 mg 1 tablet daily.  Labs: 11/16/2016 Creatinine 1.57, BUN 26, Potassium 4.3, Sodium 143, EGFR 44-53  Recommendations: No changes.  Advised to limit salt intake to 2000 mg/day and fluid intake to < 2 liters/day.  Encouraged to call for fluid symptoms.  Follow-up plan: ICM clinic phone appointment on 05/27/2017.    Copy of ICM check sent to device physician.   3 month ICM trend: 04/23/2017       1 Year ICM trend:      Rosalene Billings, RN 04/23/2017 8:13 AM

## 2017-05-27 ENCOUNTER — Ambulatory Visit (INDEPENDENT_AMBULATORY_CARE_PROVIDER_SITE_OTHER): Payer: Medicare Other

## 2017-05-27 ENCOUNTER — Ambulatory Visit (INDEPENDENT_AMBULATORY_CARE_PROVIDER_SITE_OTHER): Payer: Medicare Other | Admitting: *Deleted

## 2017-05-27 DIAGNOSIS — Z95810 Presence of automatic (implantable) cardiac defibrillator: Secondary | ICD-10-CM

## 2017-05-27 DIAGNOSIS — I255 Ischemic cardiomyopathy: Secondary | ICD-10-CM

## 2017-05-27 DIAGNOSIS — I5022 Chronic systolic (congestive) heart failure: Secondary | ICD-10-CM | POA: Diagnosis not present

## 2017-05-27 NOTE — Progress Notes (Signed)
EPIC Encounter for ICM Monitoring  Patient Name: Lonnie [name] is a 71 y.o. male Date: 05/27/2017 Primary Care Physican: Redmond School, MD Primary Cardiologist:Hochrein Electrophysiologist: Allred Dry Weight:unknown  AT/AF Burden 21%         Heart Failure questions reviewed, pt asymptomatic.   Thoracic impedance abnormal suggesting fluid accumulation since 05/24/2017.  Prescribed dosage: Furosemide 40 mg 1 tablet daily.  Labs: 11/16/2016 Creatinine 1.57, BUN 26, Potassium 4.3, Sodium 143, EGFR 44-53  Recommendations:  Copy of ICM check sent to Dr. Percival Spanish and Dr. Rayann Heman for review and recommendations if needed.   Advised to limit salt to 2000 mg daily.  He has been eating snacks high in salt.  Follow-up plan: ICM clinic phone appointment on 06/04/2017 to recheck fluid levels.  Office appointment scheduled 06/13/2017 with Dr. Percival Spanish.  3 month ICM trend: 05/27/2017    AT/AF Burden   1 Year ICM trend:      Rosalene Billings, RN 05/27/2017 1:30 PM

## 2017-05-27 NOTE — Progress Notes (Signed)
Remote ICD transmission.   

## 2017-05-29 ENCOUNTER — Encounter: Payer: Self-pay | Admitting: Cardiology

## 2017-05-29 LAB — CUP PACEART REMOTE DEVICE CHECK
Date Time Interrogation Session: 20180919125303
Implantable Lead Implant Date: 20150326
Implantable Lead Location: 753860
Implantable Pulse Generator Implant Date: 20150326
Lead Channel Setting Sensing Sensitivity: 0.5 mV
MDC IDC LEAD IMPLANT DT: 20150326
MDC IDC LEAD LOCATION: 753859
MDC IDC SET LEADCHNL RV PACING AMPLITUDE: 2.5 V
MDC IDC SET LEADCHNL RV PACING PULSEWIDTH: 0.8 ms
Pulse Gen Serial Number: 7175865

## 2017-06-04 ENCOUNTER — Ambulatory Visit (INDEPENDENT_AMBULATORY_CARE_PROVIDER_SITE_OTHER): Payer: Medicare Other

## 2017-06-04 ENCOUNTER — Telehealth: Payer: Self-pay

## 2017-06-04 DIAGNOSIS — Z95810 Presence of automatic (implantable) cardiac defibrillator: Secondary | ICD-10-CM

## 2017-06-04 DIAGNOSIS — I5022 Chronic systolic (congestive) heart failure: Secondary | ICD-10-CM

## 2017-06-04 NOTE — Progress Notes (Signed)
EPIC Encounter for ICM Monitoring  Patient Name: Lonnie [name] is a 71 y.o. male Date: 06/04/2017 Primary Care Physican: Redmond School, MD Primary Cardiologist:Hochrein Electrophysiologist: Allred Dry Weight:unknown  AT/AF Burden 20%         Attempted call to patient and unable to reach.  Transmission reviewed.    Thoracic impedance normal but was abnormal suggesting fluid accumulation from 05/25/2017 to 05/28/2017.  Prescribed dosage: Furosemide 40 mg 1 tablet daily.  Labs: 11/16/2016 Creatinine 1.57, BUN 26, Potassium 4.3, Sodium 143, EGFR 44-53  Recommendations: NONE - Unable to reach patient   Follow-up plan: ICM clinic phone appointment on 07/01/2017.  Office appointment scheduled 06/13/2017 with Dr. Percival Spanish.  Copy of ICM check sent to Dr. Rayann Heman.   3 month ICM trend: 9/35/2018   AT/AF        1 Year ICM trend:   Rosalene Billings, RN 06/04/2017 9:33 AM

## 2017-06-04 NOTE — Telephone Encounter (Signed)
Remote ICM transmission received.  Attempted call to patient and voice mail has not been set up.  

## 2017-06-13 ENCOUNTER — Ambulatory Visit: Payer: Medicare Other | Admitting: Cardiology

## 2017-06-13 NOTE — Progress Notes (Signed)
HPI The patient presents for evaluation after CABG and mitral valve replacement. He presented late after myocardial infarction. 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 He ultimately required CABG with bioprosthetic mitral valve replacement. He had a resultant EF of 30-35%.  We had discussed an ICD and he was considering this.   He suffered cardiac arrest on 2/22 with VT/VF.  Follow up cath demonstrated patent bypass grafts.  He initially went home with a Life Vest.  He subsequently returned for ICD placement on 12/03/13.  In November his ICD fired while he was at work. Prior to that he had a syncopal episode and suffered a subdural hematoma. He has recovered from this and now is status post Watchman.  His last EF is 20 - 25%.    Since I last saw him he has done well.  The patient denies any new symptoms such as chest discomfort, neck or arm discomfort. There has been no new shortness of breath, PND or orthopnea. There have been no reported palpitations, presyncope or syncope.   He is not being as active as I would like.     No Known Allergies  Current Outpatient Prescriptions  Medication Sig Dispense Refill  . acetaminophen (TYLENOL) 500 MG tablet Take 1,000 mg by mouth daily as needed for mild pain.     Marland Kitchen aspirin 325 MG tablet Take 325 mg by mouth daily.    Marland Kitchen atorvastatin (LIPITOR) 80 MG tablet Take 1 tablet (80 mg total) by mouth daily at 6 PM. 90 tablet 1  . bisoprolol (ZEBETA) 10 MG tablet Take 10 mg by mouth daily.    . furosemide (LASIX) 40 MG tablet TAKE 1 TABLET BY MOUTH ONCE DAILY 30 tablet 6  . hydrALAZINE (APRESOLINE) 50 MG tablet Take 1 tablet (50 mg total) by mouth 2 (two) times daily. 60 tablet 6  . isosorbide mononitrate (IMDUR) 60 MG 24 hr tablet Take 1 tablet (60 mg total) by mouth daily. 30 tablet 10  . nitroGLYCERIN (NITROSTAT) 0.4 MG SL tablet Place 1 tablet (0.4 mg total) under the tongue every 5 (five) minutes as needed for chest pain. 25 tablet  3   No current facility-administered medications for this visit.     Past Medical History:  Diagnosis Date  . Atrial tachycardia (HCC)   . CAD (coronary artery disease)    LAD 95% proximal stenosis, D1 50-60% stenosis, the circumflex 40% stenosis, RCA subtotal stenosis.  . Cardiomyopathy, ischemic    EF was 30-35% by echo but 45-50% by cath.  Most recent EF 15%.    . Complication of anesthesia    "Hard time waking me up" after sedation after dental procedure  . Constipation   . H/O cardiac arrest   . High cholesterol   . Hypertension   . Mitral regurgitation    Secondary to papillary muscle rupture  . Reflux   . Ventricular fibrillation (HCC) 02/16/14   successfully defibrillation by his ICD    Past Surgical History:  Procedure Laterality Date  . CARDIOVERSION N/A 01/25/2014   Procedure: CARDIOVERSION;  Surgeon: Wendall Stade, MD;  Location: [facility] ENDOSCOPY;  Service: Cardiovascular;  Laterality: N/A;  . CORONARY ARTERY BYPASS GRAFT    . ENDOVEIN HARVEST OF GREATER SAPHENOUS VEIN Right 02/05/2013   Procedure: ENDOVEIN HARVEST OF GREATER SAPHENOUS VEIN;  Surgeon: [name], MD;  Location[facility] OR;  Service: Open Heart Surgery;  Laterality: Right;  . IMPLANTABLE CARDIOVERTER DEFIBRILLATOR IMPLANT  12/03/2013   STJ  Fortify ICD implanted for secondary prevention by Dr Johney Frame  . IMPLANTABLE CARDIOVERTER DEFIBRILLATOR IMPLANT N/A 12/03/2013   Procedure: IMPLANTABLE CARDIOVERTER DEFIBRILLATOR IMPLANT;  Surgeon: Gardiner Rhyme, MD;  Location: [facility] CATH LAB;  Service: Cardiovascular;  Laterality: N/A;  . INTRAOPERATIVE TRANSESOPHAGEAL ECHOCARDIOGRAM N/A 02/05/2013   Procedure: INTRAOPERATIVE TRANSESOPHAGEAL ECHOCARDIOGRAM;  Surgeon: [name], MD;  Location: [facility] OR;  Service: Open Heart Surgery;  Laterality: N/A;  . LEFT ATRIAL APPENDAGE OCCLUSION N/A 01/26/2016   Procedure: LEFT ATRIAL APPENDAGE OCCLUSION;  Surgeon: Hillis Range, MD;  Location: [facility] INVASIVE CV LAB;  Service:  Cardiovascular;  Laterality: N/A;  . LEFT HEART CATHETERIZATION WITH CORONARY ANGIOGRAM N/A 02/03/2013   Procedure: LEFT HEART CATHETERIZATION WITH CORONARY ANGIOGRAM;  Surgeon: Iran Ouch, MD;  Location: [facility] CATH LAB;  Service: Cardiovascular;  Laterality: N/A;  . LEFT HEART CATHETERIZATION WITH CORONARY/GRAFT ANGIOGRAM N/A 11/01/2013   Procedure: LEFT HEART CATHETERIZATION WITH Isabel Caprice;  Surgeon: Peter M Swaziland, MD;  Location: [facility] CATH LAB;  Service: Cardiovascular;  Laterality: N/A;  . MITRAL VALVE REPLACEMENT (MVR)/CORONARY ARTERY BYPASS GRAFTING (CABG) N/A 02/05/2013   Procedure: MITRAL VALVE REPLACEMENT (MVR)/CORONARY ARTERY BYPASS GRAFTING (CABG);  Surgeon: [name], MD;  Location: [facility] OR;  Service: Open Heart Surgery;  Laterality: N/A;  x3 using right greater saphenous vein and left internal mammary.   Marland Kitchen PATENT FORAMEN OVALE CLOSURE N/A 02/05/2013   Procedure: PATENT FORAMEN OVALE CLOSURE;  Surgeon: [name], MD;  Location: [facility] OR;  Service: Open Heart Surgery;  Laterality: N/A;  . stomach ulcer repair    . TEE WITHOUT CARDIOVERSION N/A 02/04/2013   Procedure: TRANSESOPHAGEAL ECHOCARDIOGRAM (TEE);  Surgeon: Vesta Mixer, MD;  Location: [facility] ENDOSCOPY;  Service: Cardiovascular;  Laterality: N/A;  . TEE WITHOUT CARDIOVERSION N/A 12/05/2015   Procedure: TRANSESOPHAGEAL ECHOCARDIOGRAM (TEE);  Surgeon: Lewayne Bunting, MD;  Location: [facility] ENDOSCOPY;  Service: Cardiovascular;  Laterality: N/A;  . TEE WITHOUT CARDIOVERSION N/A 03/15/2016   Procedure: TRANSESOPHAGEAL ECHOCARDIOGRAM (TEE);  Surgeon: Chrystie Nose, MD;  Location: [facility] ENDOSCOPY;  Service: Cardiovascular;  Laterality: N/A;    ROS:  As stated in the HPI and negative for all other systems.   PHYSICAL EXAM BP 112/68   Pulse 61   Ht  (1.854 m)   Wt 234 lb (106.1 kg)   BMI 30.87 kg/m   GENERAL:  Well appearing NECK:  No jugular venous distention, waveform within normal limits, carotid  upstroke brisk and symmetric, no bruits, no thyromegaly LUNGS:  Clear to auscultation bilaterally CHEST: Well healed sternotomy scar. HEART:  PMI not displaced or sustained,S1 and S2 within normal limits, no S3, no S4, no clicks, no rubs, Soft apical systolic murmur radiating slightly out the aortic outflow tract, no diastolic murmurs ABD:  Flat, positive bowel sounds normal in frequency in pitch, no bruits, no rebound, no guarding, no midline pulsatile mass, no hepatomegaly, no splenomegaly EXT:  2 plus pulses throughout, no edema, no cyanosis no clubbing   EKG:  Ventricular pacing.  Lab Results  Component Value Date   CREATININE 1.46 (H) 01/27/2016   CREATININE 1.33 (H) 01/23/2016   CREATININE 1.51 (H) 11/28/2015     ASSESSMENT AND PLAN  CARDIOMYOPATHY:  I'm going to reduce his bisoprolol to the maximum 10 mg. He's forgetting to take his hydralazine on the third dose on a change that to 50 mg twice daily. He seems to be euvolemic and he'll otherwise continue on the meds as listed.  MVR: He has stable  valve prosthesis on his last TEE.  No change in therapy is indicated.    CAD:  The patient has no new sypmtoms.  No further cardiovascular testing is indicated.  We will continue with aggressive risk reduction and meds as listed.  ATRIAL FIBRILLATION:  He is not a good anticoagulation candidate  He has now had a Watchman.  Interestingly the ASA is suggested to be 325.  I will continue this dose.   ICD:  He is up to date with follow up and I reviewed the report.   CKD:  Creatinine was 1.57 in March. No change in therapy is planned.

## 2017-06-14 ENCOUNTER — Ambulatory Visit (INDEPENDENT_AMBULATORY_CARE_PROVIDER_SITE_OTHER): Payer: Medicare Other | Admitting: Cardiology

## 2017-06-14 ENCOUNTER — Encounter: Payer: Self-pay | Admitting: Cardiology

## 2017-06-14 VITALS — BP 112/68 | HR 61 | Ht 73.0 in | Wt 234.0 lb

## 2017-06-14 DIAGNOSIS — I2510 Atherosclerotic heart disease of native coronary artery without angina pectoris: Secondary | ICD-10-CM | POA: Diagnosis not present

## 2017-06-14 DIAGNOSIS — I481 Persistent atrial fibrillation: Secondary | ICD-10-CM | POA: Diagnosis not present

## 2017-06-14 DIAGNOSIS — I4819 Other persistent atrial fibrillation: Secondary | ICD-10-CM

## 2017-06-14 DIAGNOSIS — Z952 Presence of prosthetic heart valve: Secondary | ICD-10-CM | POA: Diagnosis not present

## 2017-06-14 MED ORDER — HYDRALAZINE HCL 50 MG PO TABS: 50.0000 mg | ORAL_TABLET | Freq: Two times a day (BID) | ORAL | 6 refills | Status: DC

## 2017-06-14 NOTE — Patient Instructions (Signed)
Medication Instructions:  INCREASE- Hydralazine 50 mg twice a day DECREASE- Bisoprolol 10 mg daily  If you need a refill on your cardiac medications before your next appointment, please call your pharmacy.  Labwork: None Ordered   Testing/Procedures: None Ordered   Follow-Up: Your physician wants you to follow-up in: 1 Year. You should receive a reminder letter in the mail two months in advance. If you do not receive a letter, please call our office [phone].   Thank you for choosing [facility] at [facility]!!

## 2017-06-16 ENCOUNTER — Encounter: Payer: Self-pay | Admitting: Cardiology

## 2017-07-01 ENCOUNTER — Other Ambulatory Visit: Payer: Self-pay | Admitting: Internal Medicine

## 2017-07-01 ENCOUNTER — Ambulatory Visit (INDEPENDENT_AMBULATORY_CARE_PROVIDER_SITE_OTHER): Payer: Medicare Other

## 2017-07-01 DIAGNOSIS — I5022 Chronic systolic (congestive) heart failure: Secondary | ICD-10-CM

## 2017-07-01 DIAGNOSIS — Z95810 Presence of automatic (implantable) cardiac defibrillator: Secondary | ICD-10-CM

## 2017-07-01 NOTE — Progress Notes (Signed)
EPIC Encounter for ICM Monitoring  Patient Name: Lonnie [name] is a 72 y.o. male Date: 07/01/2017 Primary Care Physican: Redmond School, MD Primary Cardiologist:Hochrein Electrophysiologist: Allred Dry Weight:does not weigh  AT/AF Burden 18%       Heart Failure questions reviewed, pt asymptomatic.   Thoracic impedance normal.  Prescribed dosage: Furosemide 40 mg 1 tablet daily.  Labs: 11/16/2016 Creatinine 1.57, BUN 26, Potassium 4.3, Sodium 143, EGFR 44-53  Recommendations: No changes.   Encouraged to call for fluid symptoms.  Follow-up plan: ICM clinic phone appointment on 08/05/2017.   Copy of ICM check sent to Dr. Rayann Heman.   3 month ICM trend: 07/01/2017   1 Year ICM trend:      Rosalene Billings, RN 07/01/2017 10:16 AM

## 2017-07-08 ENCOUNTER — Other Ambulatory Visit: Payer: Self-pay

## 2017-07-08 ENCOUNTER — Other Ambulatory Visit: Payer: Self-pay | Admitting: Cardiology

## 2017-07-08 MED ORDER — BISOPROLOL FUMARATE 10 MG PO TABS: 10.0000 mg | ORAL_TABLET | Freq: Every day | ORAL | 3 refills | Status: DC

## 2017-07-08 NOTE — Telephone Encounter (Signed)
REFILL 

## 2017-07-31 ENCOUNTER — Other Ambulatory Visit: Payer: Self-pay | Admitting: Cardiology

## 2017-07-31 NOTE — Telephone Encounter (Signed)
REFILL 

## 2017-08-05 ENCOUNTER — Telehealth: Payer: Self-pay | Admitting: Cardiology

## 2017-08-05 NOTE — Telephone Encounter (Signed)
Attempted to confirm remote transmission with pt. No answer and was unable to leave a message.   

## 2017-08-07 IMAGING — CT CT HEAD W/O CM
1 series · 15 of 29 positions shown, 19 images · non-contrast
Comparison: July 13, 2015

CLINICAL DATA: Prior subdural hematoma with recurrent atrial
fibrillation

EXAM:
CT HEAD WITHOUT CONTRAST
TECHNIQUE: Contiguous axial images were obtained from the base of the skull
through the vertex without intravenous contrast.

[Series 3: head 5.0 h37s · axial · 0.42mm/px · z∈[-29,+101]mm · 15 of 29 slices shown, 19 images]
[im 2/29  brain]
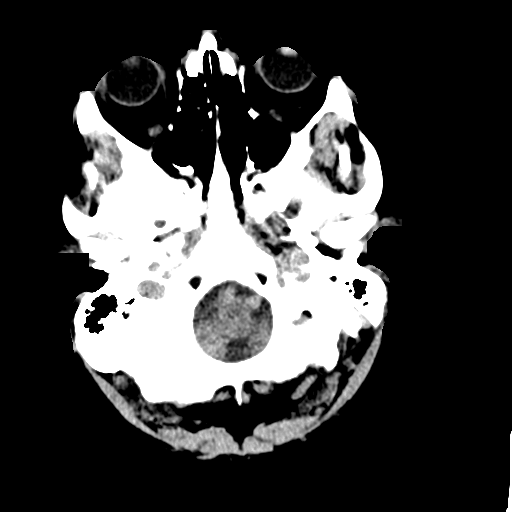
[im 2/29  bone]
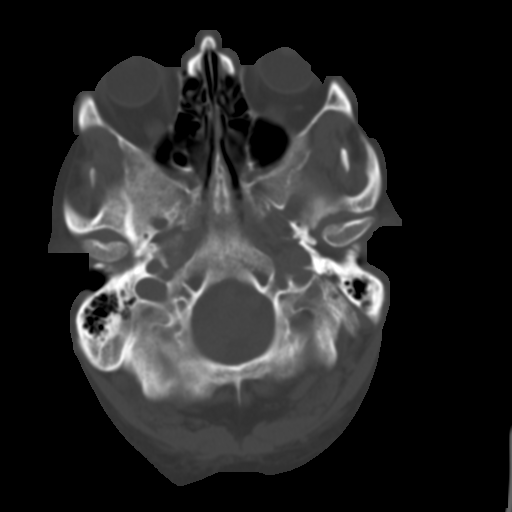
[im 4/29  brain]
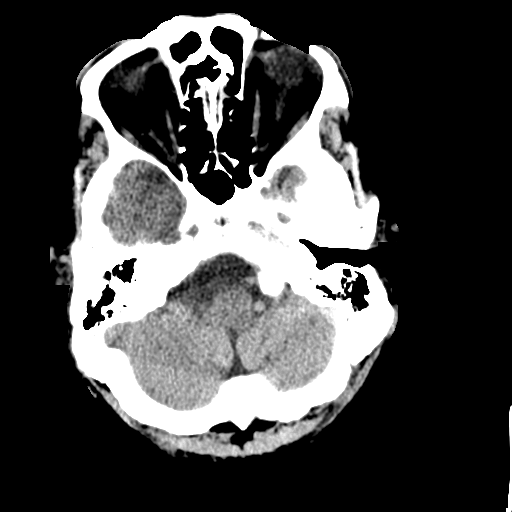
[im 6/29  brain]
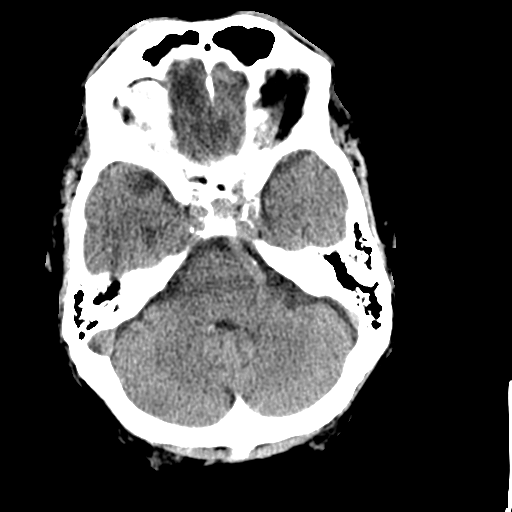
[im 8/29  brain]
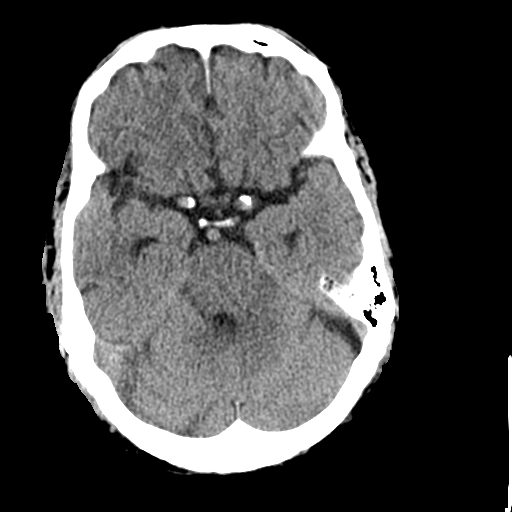
[im 10/29  brain]
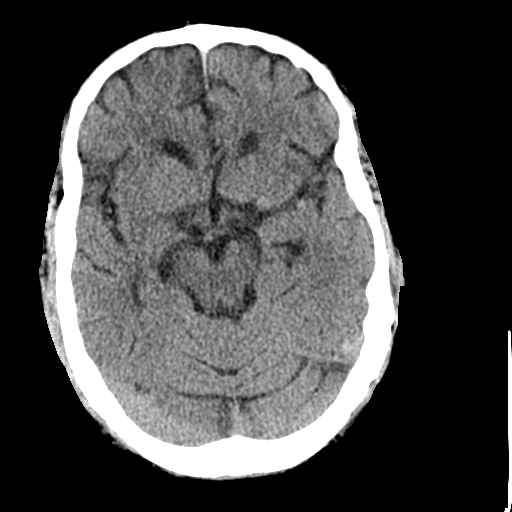
[im 10/29  bone]
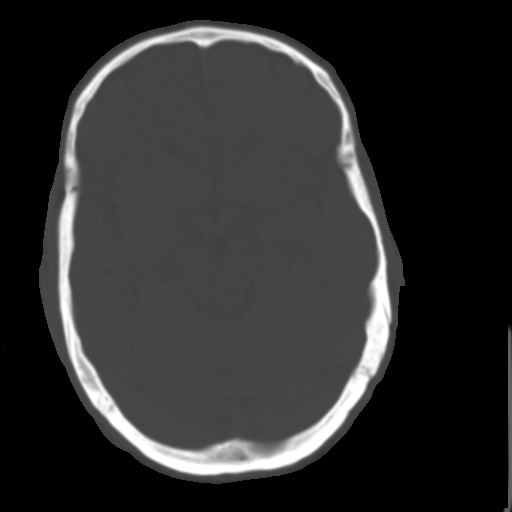
[im 11/29  brain]
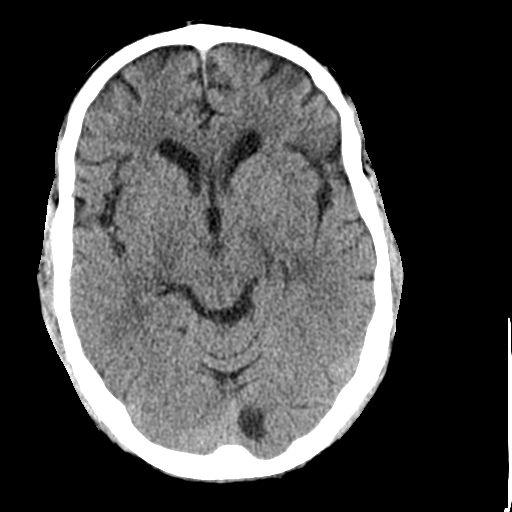
[im 13/29  brain]
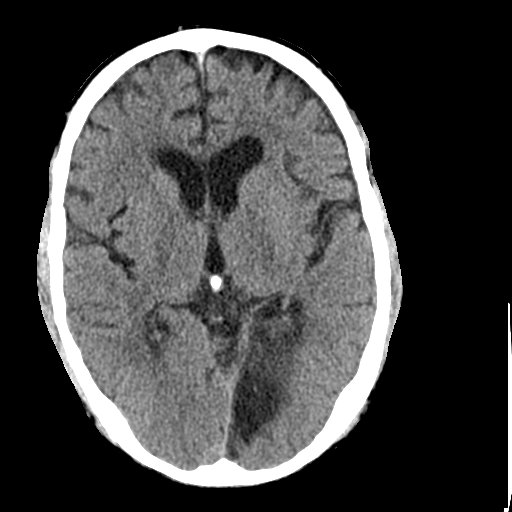
[im 15/29  brain]
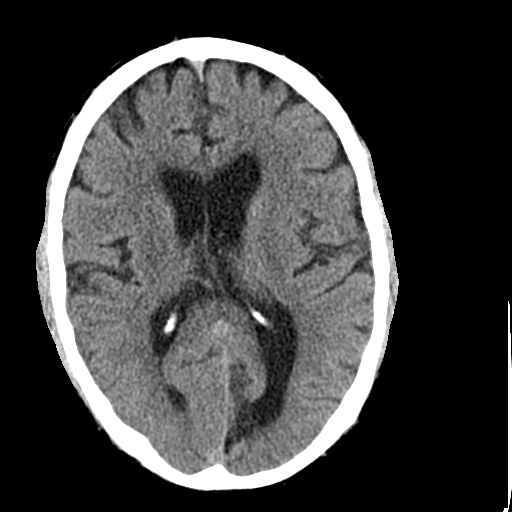
[im 17/29  brain]
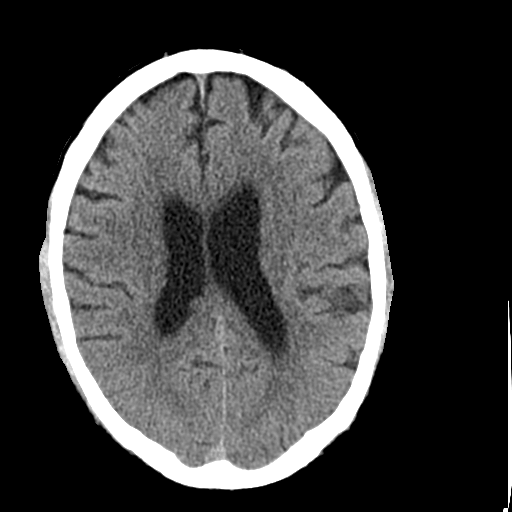
[im 17/29  bone]
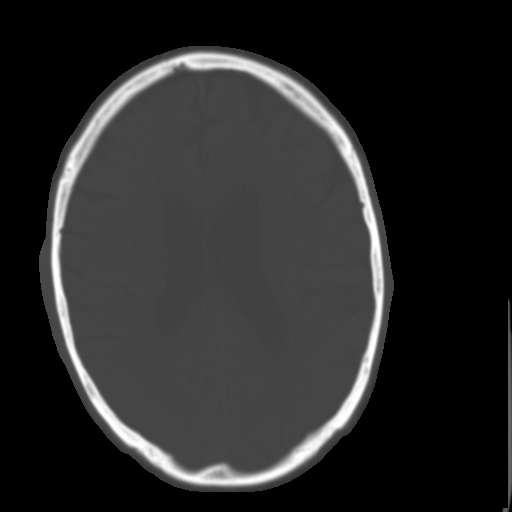
[im 19/29  brain]
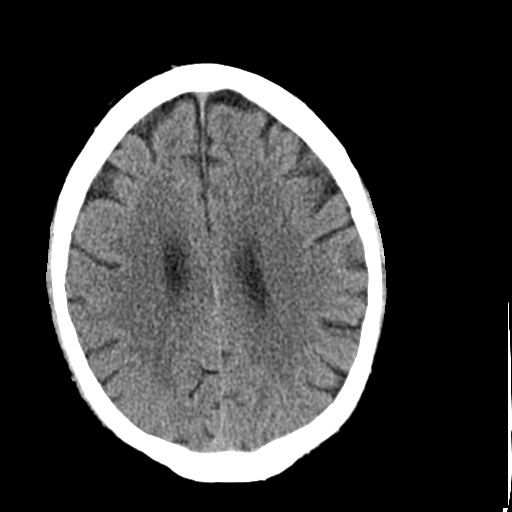
[im 20/29  brain]
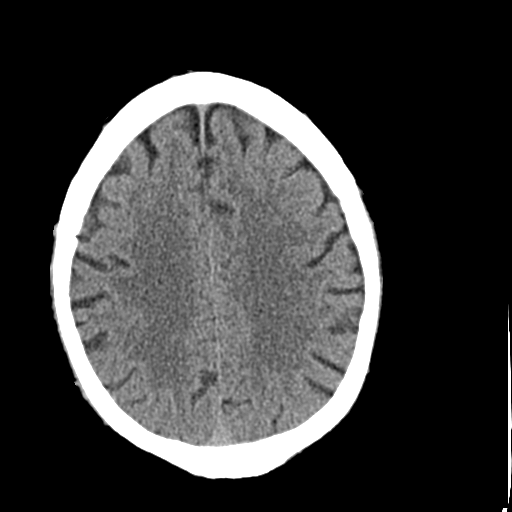
[im 22/29  brain]
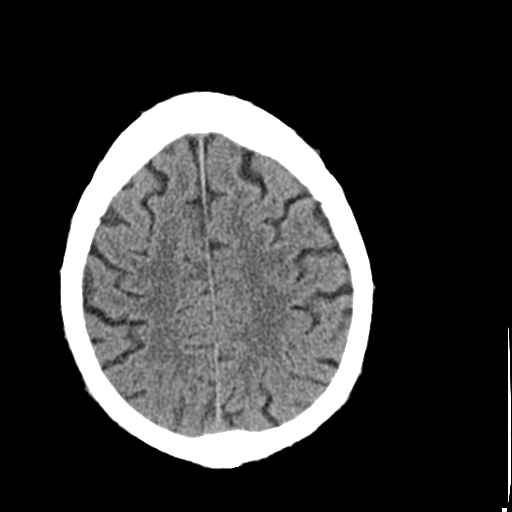
[im 24/29  brain]
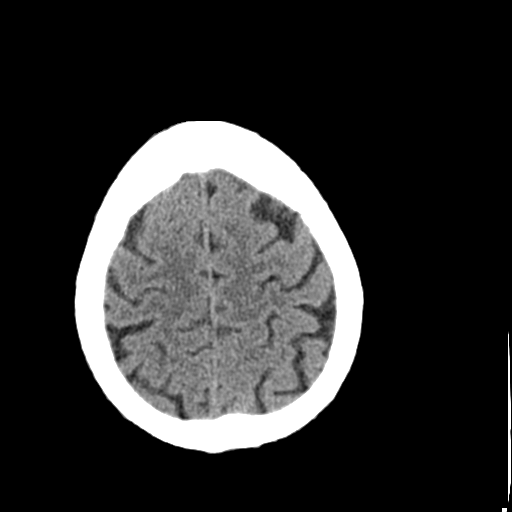
[im 24/29  bone]
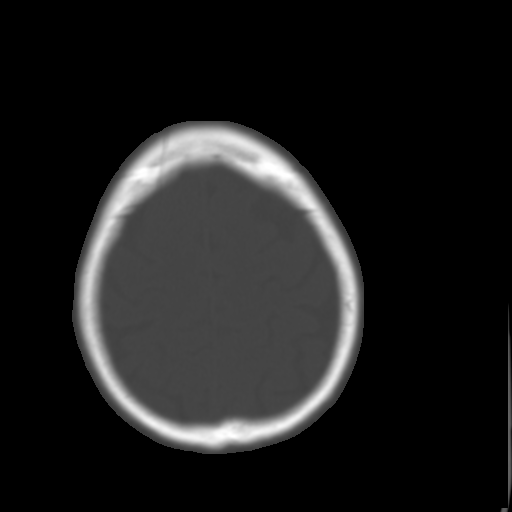
[im 26/29  brain]
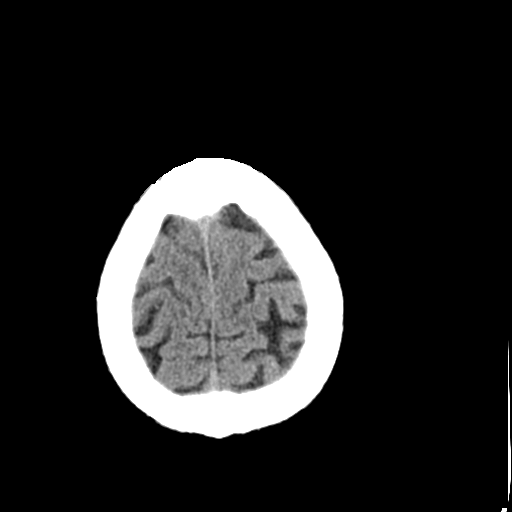
[im 28/29  brain]
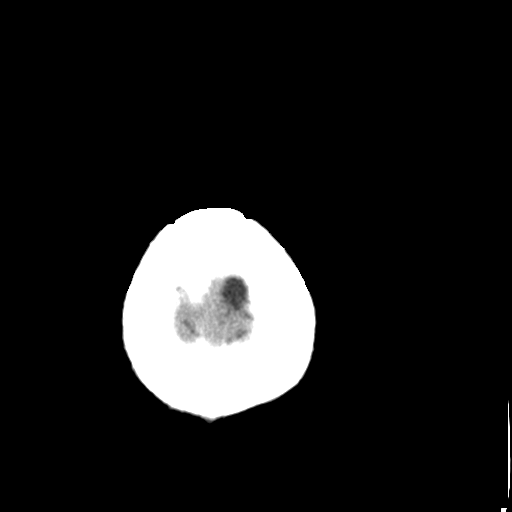

[15 of 29 positions shown; findings below may reference images not displayed]

FINDINGS: There is mild diffuse atrophy. The previous subarachnoid hemorrhage
as well as previous right-sided subdural hematomas have resolved.
Currently there is no intra-axial or extra-axial hemorrhage. There
are no subdural or epidural fluid collections. There is no mass or
edema. No midline shift. There is evidence of a prior infarct in the
medial left occipital lobe, stable. There is minimal periventricular
small vessel disease in the centra semiovale bilaterally. No acute
infarct is appreciable. The bony calvarium appears intact.
Visualized mastoid air cells are clear. No intraorbital lesions are
evident in the visualized orbital regions. There are foci of
calcification in the cavernous carotid arteries bilaterally.
IMPRESSION: No residual recurrent hemorrhage or extra-axial fluid identified. No
mass or edema. Prior infarct medial left occipital lobe. Mild
atrophy with minimal periventricular small vessel disease. No acute
appearing infarct.

## 2017-08-15 NOTE — Progress Notes (Signed)
No ICM remote transmission received for 08/05/2017 and next ICM transmission scheduled for 08/26/2017.

## 2017-08-26 ENCOUNTER — Ambulatory Visit (INDEPENDENT_AMBULATORY_CARE_PROVIDER_SITE_OTHER): Payer: Medicare Other | Admitting: *Deleted

## 2017-08-26 DIAGNOSIS — I5022 Chronic systolic (congestive) heart failure: Secondary | ICD-10-CM | POA: Diagnosis not present

## 2017-08-26 DIAGNOSIS — Z95810 Presence of automatic (implantable) cardiac defibrillator: Secondary | ICD-10-CM | POA: Diagnosis not present

## 2017-08-26 DIAGNOSIS — I255 Ischemic cardiomyopathy: Secondary | ICD-10-CM

## 2017-08-26 NOTE — Progress Notes (Signed)
EPIC Encounter for ICM Monitoring  Patient Name: Lonnie [name] is a 71 y.o. male Date: 08/26/2017 Primary Care Physican: Redmond School, MD Primary Cardiologist:Hochrein Electrophysiologist: Allred Dry Weight:does not weigh  AT/AF Burden 14%           Spoke with patient and wife.  Heart Failure questions reviewed, pt asymptomatic.   Thoracic impedance abnormal suggesting fluid accumulation starting yesterday.  Wife stated patient has been eating high salty foods such as ham for the last 2 days.  Prescribed dosage: Furosemide 40 mg 1 tablet daily. (Wife distributes his medications)  Labs: 11/16/2016 Creatinine 1.57, BUN 26, Potassium 4.3, Sodium 143, EGFR 44-53  Recommendations: Advised to decrease salt intake.  Encouraged to call for fluid symptoms.  Follow-up plan: ICM clinic phone appointment on 09/26/2017.    Copy of ICM check sent to Dr. Rayann Heman.   3 month ICM trend: 08/26/2017    1 Year ICM trend:       Rosalene Billings, RN 08/26/2017 3:06 PM

## 2017-08-26 NOTE — Progress Notes (Signed)
Remote ICD transmission.   

## 2017-08-28 ENCOUNTER — Encounter: Payer: Self-pay | Admitting: Cardiology

## 2017-08-28 LAB — CUP PACEART REMOTE DEVICE CHECK
HIGH POWER IMPEDANCE MEASURED VALUE: 62 Ohm
HighPow Impedance: 62 Ohm
Implantable Lead Implant Date: 20150326
Implantable Pulse Generator Implant Date: 20150326
Lead Channel Impedance Value: 360 Ohm
Lead Channel Pacing Threshold Pulse Width: 0.8 ms
Lead Channel Setting Pacing Pulse Width: 0.8 ms
MDC IDC LEAD IMPLANT DT: 20150326
MDC IDC LEAD LOCATION: 753859
MDC IDC LEAD LOCATION: 753860
MDC IDC MSMT BATTERY REMAINING LONGEVITY: 46 mo
MDC IDC MSMT BATTERY REMAINING PERCENTAGE: 52 %
MDC IDC MSMT BATTERY VOLTAGE: 2.92 V
MDC IDC MSMT LEADCHNL RA SENSING INTR AMPL: 4.6 mV
MDC IDC MSMT LEADCHNL RV IMPEDANCE VALUE: 360 Ohm
MDC IDC MSMT LEADCHNL RV PACING THRESHOLD AMPLITUDE: 1.25 V
MDC IDC MSMT LEADCHNL RV SENSING INTR AMPL: 10 mV
MDC IDC PG SERIAL: 7175865
MDC IDC SESS DTM: 20181217095906
MDC IDC SET LEADCHNL RV PACING AMPLITUDE: 2.5 V
MDC IDC SET LEADCHNL RV SENSING SENSITIVITY: 0.5 mV
MDC IDC STAT BRADY RV PERCENT PACED: 83 %

## 2017-09-26 ENCOUNTER — Ambulatory Visit (INDEPENDENT_AMBULATORY_CARE_PROVIDER_SITE_OTHER): Payer: Medicare Other

## 2017-09-26 DIAGNOSIS — I5022 Chronic systolic (congestive) heart failure: Secondary | ICD-10-CM

## 2017-09-26 DIAGNOSIS — Z95810 Presence of automatic (implantable) cardiac defibrillator: Secondary | ICD-10-CM

## 2017-09-26 NOTE — Progress Notes (Signed)
EPIC Encounter for ICM Monitoring  Patient Name: Lonnie [name] is a 72 y.o. male Date: 09/26/2017 Primary Care Physican: Redmond School, MD Primary Cardiologist:Hochrein Electrophysiologist: Allred Dry Weight:does not weigh AT/AF Burden14%       Attempted call to patient and unable to reach.   Transmission reviewed.    Thoracic impedance normal.  Prescribed dosage: Furosemide 40 mg 1 tablet daily. (Wife distributes his medications)  Labs: 11/16/2016 Creatinine 1.57, BUN 26, Potassium 4.3, Sodium 143, EGFR 44-53  Recommendations: NONE - Unable to reach.  Follow-up plan: ICM clinic phone appointment on 10/28/2017.   Copy of ICM check sent to Dr. Rayann Heman.   3 month ICM trend: 09/26/2017    1 Year ICM trend:       Rosalene Billings, RN 09/26/2017 8:23 AM

## 2017-09-27 ENCOUNTER — Telehealth: Payer: Self-pay

## 2017-09-27 NOTE — Telephone Encounter (Signed)
Remote ICM transmission received.  Attempted call to patient and no answer or voice mail.    

## 2017-10-28 ENCOUNTER — Ambulatory Visit (INDEPENDENT_AMBULATORY_CARE_PROVIDER_SITE_OTHER): Payer: Medicare Other

## 2017-10-28 ENCOUNTER — Telehealth: Payer: Self-pay

## 2017-10-28 DIAGNOSIS — Z95810 Presence of automatic (implantable) cardiac defibrillator: Secondary | ICD-10-CM

## 2017-10-28 DIAGNOSIS — I5022 Chronic systolic (congestive) heart failure: Secondary | ICD-10-CM | POA: Diagnosis not present

## 2017-10-28 NOTE — Telephone Encounter (Signed)
Remote ICM transmission received.  Attempted call to patient and mail box has not been set up.  

## 2017-10-28 NOTE — Progress Notes (Signed)
EPIC Encounter for ICM Monitoring  Patient Name: Lonnie [name] is a 72 y.o. male Date: 10/28/2017 Primary Care Physican: Redmond School, MD Primary Cardiologist:Hochrein Electrophysiologist: Allred Dry Weight:does not weigh AT/AF Burden12%         Attempted call to patient and unable to reach.    Transmission reviewed.    Thoracic impedance normal.  Prescribed dosage: Furosemide 40 mg 1 tablet daily.(Wife distributes his medications)  Labs: 11/16/2016 Creatinine 1.57, BUN 26, Potassium 4.3, Sodium 143, EGFR 44-53  Recommendations: NONE - Unable to reach..  Follow-up plan: ICM clinic phone appointment on 11/28/2017.  Copy of ICM check sent to Dr. Rayann Heman.   3 month ICM trend: 10/28/2017    1 Year ICM trend:       Rosalene Billings, RN 10/28/2017 2:26 PM

## 2017-11-08 ENCOUNTER — Encounter: Payer: Medicare Other | Admitting: Internal Medicine

## 2017-11-20 ENCOUNTER — Other Ambulatory Visit: Payer: Self-pay | Admitting: Internal Medicine

## 2017-11-28 ENCOUNTER — Telehealth: Payer: Self-pay | Admitting: Cardiology

## 2017-11-28 ENCOUNTER — Ambulatory Visit (INDEPENDENT_AMBULATORY_CARE_PROVIDER_SITE_OTHER): Payer: Medicare Other | Admitting: *Deleted

## 2017-11-28 DIAGNOSIS — I255 Ischemic cardiomyopathy: Secondary | ICD-10-CM

## 2017-11-28 NOTE — Telephone Encounter (Signed)
Spoke with pt and reminded pt of remote transmission that is due today. Pt verbalized understanding.   

## 2017-11-29 ENCOUNTER — Encounter: Payer: Self-pay | Admitting: Cardiology

## 2017-11-29 ENCOUNTER — Encounter: Payer: Self-pay | Admitting: Internal Medicine

## 2017-11-29 ENCOUNTER — Other Ambulatory Visit: Payer: Self-pay

## 2017-11-29 ENCOUNTER — Ambulatory Visit: Payer: Medicare Other | Admitting: Internal Medicine

## 2017-11-29 VITALS — BP 115/73 | HR 64 | Ht 73.0 in | Wt 231.0 lb

## 2017-11-29 DIAGNOSIS — I480 Paroxysmal atrial fibrillation: Secondary | ICD-10-CM

## 2017-11-29 DIAGNOSIS — I519 Heart disease, unspecified: Secondary | ICD-10-CM | POA: Diagnosis not present

## 2017-11-29 DIAGNOSIS — I255 Ischemic cardiomyopathy: Secondary | ICD-10-CM

## 2017-11-29 NOTE — Progress Notes (Signed)
ICM Remote transmission rescheduled to 12/30/2017.

## 2017-11-29 NOTE — Progress Notes (Signed)
PCP: Elfredia NevinsFusco, Lawrence, MD Primary Cardiologist:  Dr Antoine PocheHochrein Primary EP: Dr Abe PeopleAllred  Lonnie [name] is a 72 y.o. male who presents today for routine electrophysiology followup.  Since last being seen in our clinic, the patient reports doing very well.  Today, he denies symptoms of palpitations, chest pain, shortness of breath,  lower extremity edema, dizziness, presyncope, syncope, or ICD shocks.  The patient is otherwise without complaint today.   Past Medical History:  Diagnosis Date  . Atrial tachycardia (HCC)   . CAD (coronary artery disease)    LAD 95% proximal stenosis, D1 50-60% stenosis, the circumflex 40% stenosis, RCA subtotal stenosis.  . Cardiomyopathy, ischemic    EF was 30-35% by echo but 45-50% by cath.  Most recent EF 15%.    . Complication of anesthesia    "Hard time waking me up" after sedation after dental procedure  . Constipation   . H/O cardiac arrest   . High cholesterol   . Hypertension   . Mitral regurgitation    Secondary to papillary muscle rupture  . Reflux   . Ventricular fibrillation (HCC) 02/16/14   successfully defibrillation by his ICD   Past Surgical History:  Procedure Laterality Date  . CARDIOVERSION N/A 01/25/2014   Procedure: CARDIOVERSION;  Surgeon: Wendall StadePeter C Nishan, MD;  [facility];  Service: Cardiovascular;  Laterality: N/A;  . CORONARY ARTERY BYPASS GRAFT    . ENDOVEIN HARVEST OF GREATER SAPHENOUS VEIN Right 02/05/2013   Procedure: ENDOVEIN HARVEST OF GREATER SAPHENOUS VEIN;  Surgeon: Loreli SlotSteven C Hendrickson, MD;  [facility];  Service: Open Heart Surgery;  Laterality: Right;  . IMPLANTABLE CARDIOVERTER DEFIBRILLATOR IMPLANT  12/03/2013   STJ Fortify ICD implanted for secondary prevention by Dr [name]  . IMPLANTABLE CARDIOVERTER DEFIBRILLATOR IMPLANT N/A 12/03/2013   Procedure: IMPLANTABLE CARDIOVERTER DEFIBRILLATOR IMPLANT;  Surgeon: Gardiner RhymeJames D Zoiey Christy, MD;  [facility];  Service: Cardiovascular;  Laterality: N/A;  .  INTRAOPERATIVE TRANSESOPHAGEAL ECHOCARDIOGRAM N/A 02/05/2013   Procedure: INTRAOPERATIVE TRANSESOPHAGEAL ECHOCARDIOGRAM;  Surgeon: Loreli SlotSteven C Hendrickson, MD;  [facility];  Service: Open Heart Surgery;  Laterality: N/A;  . LEFT ATRIAL APPENDAGE OCCLUSION N/A 01/26/2016   Procedure: LEFT ATRIAL APPENDAGE OCCLUSION;  Surgeon: Hillis RangeJames Laurelyn Terrero, MD;  [facility]: [facility] INVASIVE CV LAB;  Service: Cardiovascular;  Laterality: N/A;  . LEFT HEART CATHETERIZATION WITH CORONARY ANGIOGRAM N/A 02/03/2013   Procedure: LEFT HEART CATHETERIZATION WITH CORONARY ANGIOGRAM;  Surgeon: Iran OuchMuhammad A Arida, MD;  Location: [facility] CATH LAB;  Service: Cardiovascular;  Laterality: N/A;  . LEFT HEART CATHETERIZATION WITH CORONARY/GRAFT ANGIOGRAM N/A 11/01/2013   Procedure: LEFT HEART CATHETERIZATION WITH Isabel CapriceORONARY/GRAFT ANGIOGRAM;  Surgeon: Peter M SwazilandJordan, MD;  [facility];  Service: Cardiovascular;  Laterality: N/A;  . MITRAL VALVE REPLACEMENT (MVR)/CORONARY ARTERY BYPASS GRAFTING (CABG) N/A 02/05/2013   Procedure: MITRAL VALVE REPLACEMENT (MVR)/CORONARY ARTERY BYPASS GRAFTING (CABG);  Surgeon: Loreli SlotSteven C Hendrickson, MD;  [facility];  Service: Open Heart Surgery;  Laterality: N/A;  x3 using right greater saphenous vein and left internal mammary.   Marland Kitchen. PATENT FORAMEN OVALE CLOSURE N/A 02/05/2013   Procedure: PATENT FORAMEN OVALE CLOSURE;  Surgeon: Loreli SlotSteven C Hendrickson, MD;  [facility];  Service: Open Heart Surgery;  Laterality: N/A;  . stomach ulcer repair    . TEE WITHOUT CARDIOVERSION N/A 02/04/2013   Procedure: TRANSESOPHAGEAL ECHOCARDIOGRAM (TEE);  Surgeon: Vesta MixerPhilip J Nahser, MD;  [facility];  Service: Cardiovascular;  Laterality: N/A;  . TEE WITHOUT CARDIOVERSION N/A 12/05/2015   Procedure: TRANSESOPHAGEAL  ECHOCARDIOGRAM (TEE);  Surgeon: Lewayne Bunting, MD;  Location: [facility] ENDOSCOPY;  Service: Cardiovascular;  Laterality: N/A;  . TEE WITHOUT CARDIOVERSION N/A 03/15/2016   Procedure: TRANSESOPHAGEAL ECHOCARDIOGRAM  (TEE);  Surgeon: Chrystie Nose, MD;  Location: [facility] ENDOSCOPY;  Service: Cardiovascular;  Laterality: N/A;    ROS- all systems are reviewed and negative except as per HPI above  Current Outpatient Medications  Medication Sig Dispense Refill  . acetaminophen (TYLENOL) 500 MG tablet Take 1,000 mg by mouth daily as needed for mild pain.     Marland Kitchen aspirin 325 MG tablet Take 325 mg by mouth daily.    Marland Kitchen atorvastatin (LIPITOR) 80 MG tablet TAKE 1 TABLET BY MOUTH ONCE DAILY AT  6  PM 90 tablet 3  . bisoprolol (ZEBETA) 10 MG tablet TAKE ONE TABLET BY MOUTH ONCE DAILY 90 tablet 2  . furosemide (LASIX) 40 MG tablet TAKE 1 TABLET BY MOUTH ONCE DAILY 90 tablet 1  . hydrALAZINE (APRESOLINE) 50 MG tablet Take 1 tablet (50 mg total) by mouth 2 (two) times daily. 60 tablet 6  . isosorbide mononitrate (IMDUR) 60 MG 24 hr tablet Take 1 tablet (60 mg total) by mouth daily. 30 tablet 10  . nitroGLYCERIN (NITROSTAT) 0.4 MG SL tablet Place 1 tablet (0.4 mg total) under the tongue every 5 (five) minutes as needed for chest pain. 25 tablet 3   No current facility-administered medications for this visit.     Physical Exam: Vitals:   11/29/17 0853  BP: 115/73  Pulse: 64  SpO2: 95%  Weight: 231 lb (104.8 kg)  Height: 6\' 1"  (1.854 m)    GEN- The patient is well appearing, alert and oriented x 3 today.   Head- normocephalic, atraumatic Eyes-  Sclera clear, conjunctiva pink Ears- hearing intact Oropharynx- clear Lungs- Clear to ausculation bilaterally, normal work of breathing Chest- ICD pocket is well healed Heart- Regular rate and rhythm, no murmurs, rubs or gallops, PMI not laterally displaced GI- soft, NT, ND, + BS Extremities- no clubbing, cyanosis, or edema  ICD interrogation- reviewed in detail today,  See PACEART report   Assessment and Plan:  1.  Chronic systolic dysfunction euvolemic today Stable on an appropriate medical regimen Normal ICD function See Arita Miss Art report He is back in sinus  (previously appeared to be permanent afib),  Device reprogrammed from VVIR to DDDR today to promote AV synchrony  2. Persistent afib S/p watchman On ASA 27% afib burden  3. HTN Stable No change required today   Disposition:   Follow up with Dr Antoine Poche as scheduled Merlin Return to see me in 12 months  Hillis Range MD, [facility] 11/29/2017 9:36 AM

## 2017-11-29 NOTE — Progress Notes (Signed)
Remote ICD transmission.   

## 2017-11-29 NOTE — Patient Instructions (Signed)
Your physician wants you to follow-up in: 1 YEAR WITH DR ALLRED You will receive a reminder letter in the mail two months in advance. If you don't receive a letter, please call our office to schedule the follow-up appointment.  Your physician recommends that you continue on your current medications as directed. Please refer to the Current Medication list given to you today.  Remote monitoring is used to monitor your Pacemaker of ICD from home. This monitoring reduces the number of office visits required to check your device to one time per year. It allows us to keep an eye on the functioning of your device to ensure it is working properly. You are scheduled for a device check from home on      . You may send your transmission at any time that day. If you have a wireless device, the transmission will be sent automatically. After your physician reviews your transmission, you will receive a postcard with your next transmission date.  Thank you for choosing [facility]!!       

## 2017-12-02 LAB — CUP PACEART REMOTE DEVICE CHECK
Battery Remaining Longevity: 43 mo
Battery Remaining Percentage: 50 %
Battery Voltage: 2.92 V
HIGH POWER IMPEDANCE MEASURED VALUE: 62 Ohm
HighPow Impedance: 62 Ohm
Implantable Lead Implant Date: 20150326
Implantable Lead Location: 753859
Lead Channel Impedance Value: 360 Ohm
Lead Channel Impedance Value: 380 Ohm
Lead Channel Pacing Threshold Amplitude: 1.25 V
Lead Channel Pacing Threshold Pulse Width: 0.8 ms
Lead Channel Sensing Intrinsic Amplitude: 10.6 mV
Lead Channel Setting Pacing Amplitude: 2.5 V
MDC IDC LEAD IMPLANT DT: 20150326
MDC IDC LEAD LOCATION: 753860
MDC IDC MSMT LEADCHNL RA SENSING INTR AMPL: 2.5 mV
MDC IDC PG IMPLANT DT: 20150326
MDC IDC SESS DTM: 20190321192403
MDC IDC SET LEADCHNL RV PACING PULSEWIDTH: 0.8 ms
MDC IDC SET LEADCHNL RV SENSING SENSITIVITY: 0.5 mV
MDC IDC STAT BRADY RV PERCENT PACED: 85 %
Pulse Gen Serial Number: 7175865

## 2017-12-03 LAB — CUP PACEART INCLINIC DEVICE CHECK
Brady Statistic RV Percent Paced: 85 %
HighPow Impedance: 59.625
Implantable Lead Implant Date: 20150326
Implantable Lead Implant Date: 20150326
Implantable Pulse Generator Implant Date: 20150326
Lead Channel Impedance Value: 362.5 Ohm
Lead Channel Pacing Threshold Amplitude: 0.75 V
Lead Channel Pacing Threshold Amplitude: 1 V
Lead Channel Pacing Threshold Amplitude: 1 V
Lead Channel Pacing Threshold Pulse Width: 0.8 ms
Lead Channel Pacing Threshold Pulse Width: 0.8 ms
Lead Channel Sensing Intrinsic Amplitude: 10.7 mV
Lead Channel Sensing Intrinsic Amplitude: 4.8 mV
Lead Channel Setting Pacing Amplitude: 2 V
Lead Channel Setting Pacing Pulse Width: 0.8 ms
MDC IDC LEAD LOCATION: 753859
MDC IDC LEAD LOCATION: 753860
MDC IDC MSMT BATTERY REMAINING LONGEVITY: 39 mo
MDC IDC MSMT LEADCHNL RA PACING THRESHOLD AMPLITUDE: 0.75 V
MDC IDC MSMT LEADCHNL RA PACING THRESHOLD PULSEWIDTH: 0.5 ms
MDC IDC MSMT LEADCHNL RA PACING THRESHOLD PULSEWIDTH: 0.5 ms
MDC IDC MSMT LEADCHNL RV IMPEDANCE VALUE: 375 Ohm
MDC IDC PG SERIAL: 7175865
MDC IDC SESS DTM: 20190322125214
MDC IDC SET LEADCHNL RV PACING AMPLITUDE: 2.5 V
MDC IDC SET LEADCHNL RV SENSING SENSITIVITY: 0.5 mV
MDC IDC STAT BRADY RA PERCENT PACED: 0 %

## 2017-12-30 ENCOUNTER — Ambulatory Visit (INDEPENDENT_AMBULATORY_CARE_PROVIDER_SITE_OTHER): Payer: Medicare Other

## 2017-12-30 DIAGNOSIS — Z95810 Presence of automatic (implantable) cardiac defibrillator: Secondary | ICD-10-CM

## 2017-12-30 DIAGNOSIS — I5022 Chronic systolic (congestive) heart failure: Secondary | ICD-10-CM

## 2017-12-30 NOTE — Progress Notes (Signed)
EPIC Encounter for ICM Monitoring  Patient Name: Lonnie [name] is a 72 y.o. male Date: 12/30/2017 Primary Care Physican: Redmond School, MD Primary Cardiologist:Hochrein Electrophysiologist: Allred Dry Weight:does not weigh AT/AF Burden12%        Attempted call to patient and unable to reach.    Transmission reviewed.    Thoracic impedance abnormal suggesting fluid accumulation since 12/29/2017.  Prescribed dosage: Furosemide 40 mg 1 tablet daily.(Wife distributes his medications)  Labs: 11/16/2016 Creatinine 1.57, BUN 26, Potassium 4.3, Sodium 143, EGFR 44-53  Recommendations: NONE - Unable to reach.  Follow-up plan: ICM clinic phone appointment on 01/07/2018 to recheck fluid levels.    Copy of ICM check sent to Dr. Rayann Heman and Dr. Percival Spanish for review and recommendations if needed.   3 month ICM trend: 12/30/2017    1 Year ICM trend:       Rosalene Billings, RN 12/30/2017 2:48 PM

## 2017-12-31 ENCOUNTER — Telehealth: Payer: Self-pay

## 2017-12-31 NOTE — Telephone Encounter (Signed)
Remote ICM transmission received.  Attempted call to patient and mail box has not been set up.  

## 2018-01-05 ENCOUNTER — Other Ambulatory Visit: Payer: Self-pay | Admitting: Cardiology

## 2018-01-06 NOTE — Telephone Encounter (Signed)
REFILL 

## 2018-01-07 ENCOUNTER — Ambulatory Visit (INDEPENDENT_AMBULATORY_CARE_PROVIDER_SITE_OTHER): Payer: Self-pay

## 2018-01-07 ENCOUNTER — Telehealth: Payer: Self-pay

## 2018-01-07 DIAGNOSIS — Z95810 Presence of automatic (implantable) cardiac defibrillator: Secondary | ICD-10-CM

## 2018-01-07 DIAGNOSIS — I5022 Chronic systolic (congestive) heart failure: Secondary | ICD-10-CM

## 2018-01-07 NOTE — Telephone Encounter (Signed)
Remote ICM transmission received.  Attempted call to patient and no voice mail box

## 2018-01-07 NOTE — Progress Notes (Signed)
EPIC Encounter for ICM Monitoring  Patient Name: Lonnie [name] is a 73 y.o. male Date: 01/07/2018 Primary Care Physican: Redmond School, MD Primary Cardiologist:Hochrein Electrophysiologist: Allred Dry Weight:does not weigh AT/AF Burden<1%        Attempted call to patient and unable to reach.  Transmission reviewed.    Thoracic impedance returned to normal since 12/30/2017.  Prescribed dosage: Furosemide 40 mg 1 tablet daily.(Wife distributes his medications)  Labs: 11/16/2016 Creatinine 1.57, BUN 26, Potassium 4.3, Sodium 143, EGFR 44-53  Recommendations: NONE - Unable to reach.  Follow-up plan: ICM clinic phone appointment on 01/30/2018.    Copy of ICM check sent to Dr. Rayann Heman.   3 month ICM trend: 01/07/2018    1 Year ICM trend:       Rosalene Billings, RN 01/07/2018 12:44 PM

## 2018-01-30 ENCOUNTER — Ambulatory Visit (INDEPENDENT_AMBULATORY_CARE_PROVIDER_SITE_OTHER): Payer: Medicare Other

## 2018-01-30 DIAGNOSIS — I5022 Chronic systolic (congestive) heart failure: Secondary | ICD-10-CM

## 2018-01-30 DIAGNOSIS — Z95810 Presence of automatic (implantable) cardiac defibrillator: Secondary | ICD-10-CM

## 2018-01-30 NOTE — Progress Notes (Signed)
EPIC Encounter for ICM Monitoring  Patient Name: Lonnie [name] is a 72 y.o. male Date: 01/30/2018 Primary Care Physican: Redmond School, MD Primary Cardiologist:Hochrein Electrophysiologist: Allred Dry Weight:does not weigh at home AT/AF Burden<1%        Heart Failure questions reviewed, pt symptomatic with swelling at waistline but is not weighing.  Advised the importance of daily weights and to weigh every morning before eating or drinking.  Monitor for weight gain of 3 lbs over night or 5 lbs in a week.    Thoracic impedance abnormal suggesting fluid accumulation since 01/26/2018.  Prescribed dosage: Furosemide 40 mg 1 tablet daily.(Wife distributes his medications)  Labs: 11/16/2016 Creatinine 1.57, BUN 26, Potassium 4.3, Sodium 143, EGFR 44-53  Recommendations:   Advised to limit salt intake and review food labels for salt amount.   Encouraged to call for fluid symptoms.  Follow-up plan: ICM clinic phone appointment on 02/07/2018 to recheck fluid levels.    Copy of ICM check sent to Dr. Rayann Heman and Dr. Percival Spanish for review and if any recommendations will call back.    3 month ICM trend: 01/30/2018    1 Year ICM trend:       Rosalene Billings, RN 01/30/2018 3:54 PM

## 2018-02-10 ENCOUNTER — Ambulatory Visit (INDEPENDENT_AMBULATORY_CARE_PROVIDER_SITE_OTHER): Payer: Self-pay

## 2018-02-10 DIAGNOSIS — Z95810 Presence of automatic (implantable) cardiac defibrillator: Secondary | ICD-10-CM

## 2018-02-10 DIAGNOSIS — I5022 Chronic systolic (congestive) heart failure: Secondary | ICD-10-CM

## 2018-02-10 NOTE — Progress Notes (Signed)
EPIC Encounter for ICM Monitoring  Patient Name: Lonnie [name] is a 72 y.o. male Date: 02/10/2018 Primary Care Physican: Redmond School, MD Primary Cardiologist:Hochrein Electrophysiologist: Allred Dry Weight:does not weigh at home       Attempted call to patient and unable to reach.  .  Transmission reviewed.    Thoracic impedance normal.  Prescribed dosage:Furosemide 40 mg 1 tablet daily.(Wife distributes his medications)  Labs: 11/16/2016 Creatinine 1.57, BUN 26, Potassium 4.3, Sodium 143, EGFR 44-53  Recommendations:  NONE - Unable to reach.  Follow-up plan: ICM clinic phone appointment on 03/17/2018.    Copy of ICM check sent to Dr. Rayann Heman.   3 month ICM trend: 02/09/2018    Direct Trend Viewer     1 Year ICM trend:       Rosalene Billings, RN 02/10/2018 2:29 PM

## 2018-02-11 ENCOUNTER — Telehealth: Payer: Self-pay

## 2018-02-11 NOTE — Telephone Encounter (Signed)
Remote ICM transmission received.  Attempted call to patient and no voice mail box set up.  

## 2018-02-20 ENCOUNTER — Other Ambulatory Visit: Payer: Self-pay | Admitting: Cardiology

## 2018-03-17 ENCOUNTER — Ambulatory Visit (INDEPENDENT_AMBULATORY_CARE_PROVIDER_SITE_OTHER): Payer: Medicare Other

## 2018-03-17 ENCOUNTER — Ambulatory Visit (INDEPENDENT_AMBULATORY_CARE_PROVIDER_SITE_OTHER): Payer: Medicare Other | Admitting: *Deleted

## 2018-03-17 DIAGNOSIS — Z95810 Presence of automatic (implantable) cardiac defibrillator: Secondary | ICD-10-CM

## 2018-03-17 DIAGNOSIS — I5022 Chronic systolic (congestive) heart failure: Secondary | ICD-10-CM | POA: Diagnosis not present

## 2018-03-17 DIAGNOSIS — I255 Ischemic cardiomyopathy: Secondary | ICD-10-CM

## 2018-03-17 NOTE — Progress Notes (Signed)
EPIC Encounter for ICM Monitoring  Patient Name: Lonnie [name] is a 72 y.o. male Date: 03/17/2018 Primary Care Physican: Fusco, Lawrence, MD Primary Cardiologist:Hochrein Electrophysiologist: Allred Dry Weight:does not weigh at home      Heart Failure questions reviewed, pt asymptomatic.  He said he feels fine.    Thoracic impedance abnormal suggesting fluid accumulation starting 03/16/2018.  Prescribed dosage: Furosemide 40 mg 1 tablet daily.(Wife distributes his medications)  Labs: 11/16/2016 Creatinine 1.57, BUN 26, Potassium 4.3, Sodium 143, EGFR 44-53  Recommendations:  Reinforced sodium restriction and encouraged to review food labels for salt amount.  Encouraged to call for fluid symptoms.  Follow-up plan: ICM clinic phone appointment on 03/27/2018 to recheck fluid levels.     Copy of ICM check sent to Dr. Allred and Dr Hochrein for review and recommendations if needed.   3 month ICM trend: 03/17/2018    1 Year ICM trend:       Laurie S Short, RN 03/17/2018 4:19 PM   

## 2018-03-18 NOTE — Progress Notes (Signed)
Remote ICD transmission.   

## 2018-03-25 LAB — CUP PACEART REMOTE DEVICE CHECK
Battery Remaining Percentage: 46 %
Battery Voltage: 2.9 V
Brady Statistic AP VP Percent: 83 %
Brady Statistic AS VP Percent: 16 %
Brady Statistic RA Percent Paced: 83 %
Brady Statistic RV Percent Paced: 99 %
Date Time Interrogation Session: 20190708074113
HighPow Impedance: 66 Ohm
HighPow Impedance: 66 Ohm
Implantable Lead Location: 753859
Implantable Lead Location: 753860
Implantable Pulse Generator Implant Date: 20150326
Lead Channel Impedance Value: 360 Ohm
Lead Channel Pacing Threshold Amplitude: 1 V
Lead Channel Pacing Threshold Pulse Width: 0.5 ms
Lead Channel Pacing Threshold Pulse Width: 0.8 ms
Lead Channel Sensing Intrinsic Amplitude: 7.8 mV
Lead Channel Setting Pacing Amplitude: 2 V
Lead Channel Setting Pacing Amplitude: 2.5 V
Lead Channel Setting Pacing Pulse Width: 0.8 ms
MDC IDC LEAD IMPLANT DT: 20150326
MDC IDC LEAD IMPLANT DT: 20150326
MDC IDC MSMT BATTERY REMAINING LONGEVITY: 35 mo
MDC IDC MSMT LEADCHNL RA PACING THRESHOLD AMPLITUDE: 0.75 V
MDC IDC MSMT LEADCHNL RA SENSING INTR AMPL: 3.9 mV
MDC IDC MSMT LEADCHNL RV IMPEDANCE VALUE: 350 Ohm
MDC IDC PG SERIAL: 7175865
MDC IDC SET LEADCHNL RV SENSING SENSITIVITY: 0.5 mV
MDC IDC STAT BRADY AP VS PERCENT: 1 %
MDC IDC STAT BRADY AS VS PERCENT: 1 %

## 2018-03-27 ENCOUNTER — Ambulatory Visit (INDEPENDENT_AMBULATORY_CARE_PROVIDER_SITE_OTHER): Payer: Medicare Other

## 2018-03-27 DIAGNOSIS — I5022 Chronic systolic (congestive) heart failure: Secondary | ICD-10-CM

## 2018-03-27 DIAGNOSIS — Z95810 Presence of automatic (implantable) cardiac defibrillator: Secondary | ICD-10-CM

## 2018-03-28 ENCOUNTER — Telehealth: Payer: Self-pay

## 2018-03-28 NOTE — Telephone Encounter (Signed)
Remote ICM transmission received.  Attempted call to patient and no voice mail set up 

## 2018-03-28 NOTE — Progress Notes (Signed)
EPIC Encounter for ICM Monitoring  Patient Name: Lonnie [name] is a 72 y.o. male Date: 03/28/2018 Primary Care Physican: Redmond School, MD Primary Cardiologist:Hochrein Electrophysiologist: Allred Dry Weight:does not weigh at home     Attempted call to patient and unable to reach.    Transmission reviewed.    Thoracic impedance returned to normal since last ICM transmission on 03/17/2018.   Prescribed dosage: Furosemide 40 mg 1 tablet [name].(Wife distributes his medications)  Labs: 11/16/2016 Creatinine 1.57, BUN 26, Potassium 4.3, Sodium 143, EGFR 44-53  Recommendations: NONE - Unable to reach.  Follow-up plan: ICM clinic phone appointment on 04/17/2018.       Copy of ICM check sent to Dr. Rayann Heman.   3 month ICM trend: 03/27/2018    1 Year ICM trend:      Rosalene Billings, RN 03/28/2018 9:24 AM

## 2018-04-17 ENCOUNTER — Ambulatory Visit (INDEPENDENT_AMBULATORY_CARE_PROVIDER_SITE_OTHER): Payer: Medicare Other

## 2018-04-17 DIAGNOSIS — I5022 Chronic systolic (congestive) heart failure: Secondary | ICD-10-CM

## 2018-04-17 DIAGNOSIS — Z95810 Presence of automatic (implantable) cardiac defibrillator: Secondary | ICD-10-CM

## 2018-04-18 ENCOUNTER — Telehealth: Payer: Self-pay

## 2018-04-18 NOTE — Telephone Encounter (Signed)
Remote ICM transmission received.  Attempted call to patient and voice mail box not set up.  

## 2018-04-18 NOTE — Progress Notes (Signed)
EPIC Encounter for ICM Monitoring  Patient Name: Lonnie [name] is a 72 y.o. male Date: 04/18/2018 Primary Care Physican: Redmond School, MD Primary Cardiologist:Hochrein Electrophysiologist: Allred Dry Weight:does not weigh at home      Attempted call to patient and unable to reach.  Left detailed message, per DPR, regarding transmission.  Transmission reviewed.    Thoracic impedance normal.  Prescribed dosage: Furosemide 40 mg 1 tablet daily.(Wife distributes his medications)  Labs: 11/16/2016 Creatinine 1.57, BUN 26, Potassium 4.3, Sodium 143, EGFR 44-53  Recommendations: NONE - Unable to reach.  Follow-up plan: ICM clinic phone appointment on 05/19/2018.    Copy of ICM check sent to Dr. Rayann Heman.   3 month ICM trend: 04/17/2018    1 Year ICM trend:       Rosalene Billings, RN 04/18/2018 4:13 PM

## 2018-05-19 ENCOUNTER — Ambulatory Visit (INDEPENDENT_AMBULATORY_CARE_PROVIDER_SITE_OTHER): Payer: Medicare Other

## 2018-05-19 ENCOUNTER — Other Ambulatory Visit: Payer: Self-pay | Admitting: Cardiology

## 2018-05-19 DIAGNOSIS — Z95810 Presence of automatic (implantable) cardiac defibrillator: Secondary | ICD-10-CM | POA: Diagnosis not present

## 2018-05-19 DIAGNOSIS — I5022 Chronic systolic (congestive) heart failure: Secondary | ICD-10-CM

## 2018-05-20 ENCOUNTER — Telehealth: Payer: Self-pay

## 2018-05-20 NOTE — Progress Notes (Signed)
EPIC Encounter for ICM Monitoring  Patient Name: Lonnie [name] is a 72 y.o. male Date: 05/20/2018 Primary Care Physican: Redmond School, MD Primary Cardiologist:Hochrein Electrophysiologist: Allred Dry Weight:does not weigh at home        Attempted call to patient and unable to reach.   Transmission reviewed.   Message sent to triage device clinic nurse for review of Afib and no changes needed.    Thoracic impedance normal.  Prescribed dosage: Furosemide 40 mg 1 tablet daily.(Wife distributes his medications)  Labs: 11/16/2016 Creatinine 1.57, BUN 26, Potassium 4.3, Sodium 143, EGFR 44-53  Recommendations:  NONE - Unable to reach.  Follow-up plan: ICM clinic phone appointment on 06/19/2018.   Office appointment scheduled 06/23/2018 with Dr. Percival Spanish.    Copy of ICM check sent to Dr. Rayann Heman.   3 month ICM trend: 05/19/2018    AT/AF   1 Year ICM trend:       Rosalene Billings, RN 05/20/2018 2:07 PM

## 2018-05-20 NOTE — Telephone Encounter (Signed)
Remote ICM transmission received.  Attempted call to patient and no voice mail set up 

## 2018-05-22 ENCOUNTER — Telehealth: Payer: Self-pay | Admitting: *Deleted

## 2018-05-22 NOTE — Telephone Encounter (Signed)
Persistent AF since 05/16/18 on 05/19/18 transmission. [name] aware and agreeable to Dr. Jenel [name]'s recommendations. Will send to scheduling to arrange f/u.

## 2018-05-22 NOTE — Telephone Encounter (Signed)
-----   Message from Hillis RangeJames Allred, MD sent at 05/21/2018 10:23 PM EDT ----- Looks to be having afib.  Kara MeadEmma, please look at full download.  If afib is confirmed and persistent, should be seen by EP APP in the next week if possible.   ----- Message ----- From: Karie SodaShort, Laurie S, RN Sent: 05/20/2018   4:34 PM EDT To: Hillis RangeJames Allred, MD

## 2018-05-29 ENCOUNTER — Ambulatory Visit: Payer: Medicare Other | Admitting: Internal Medicine

## 2018-05-29 ENCOUNTER — Encounter: Payer: Self-pay | Admitting: Internal Medicine

## 2018-05-29 VITALS — BP 94/66 | HR 86 | Ht 73.0 in | Wt 227.0 lb

## 2018-05-29 DIAGNOSIS — Z95810 Presence of automatic (implantable) cardiac defibrillator: Secondary | ICD-10-CM

## 2018-05-29 DIAGNOSIS — I481 Persistent atrial fibrillation: Secondary | ICD-10-CM

## 2018-05-29 DIAGNOSIS — I10 Essential (primary) hypertension: Secondary | ICD-10-CM | POA: Diagnosis not present

## 2018-05-29 DIAGNOSIS — I5022 Chronic systolic (congestive) heart failure: Secondary | ICD-10-CM | POA: Diagnosis not present

## 2018-05-29 DIAGNOSIS — I4819 Other persistent atrial fibrillation: Secondary | ICD-10-CM

## 2018-05-29 NOTE — Patient Instructions (Addendum)
Medication Instructions:  Your physician recommends that you continue on your current medications as directed. Please refer to the Current Medication list given to you today.  Labwork: None ordered.  Testing/Procedures: None ordered.  Follow-Up: Your physician wants you to follow-up in: one year with [name], NP.   You will receive a reminder letter in the mail two months in advance. If you don't receive a letter, please call our office to schedule the follow-up appointment.  Remote monitoring is used to monitor your ICD from home. This monitoring reduces the number of office visits required to check your device to one time per year. It allows us to keep an eye on the functioning of your device to ensure it is working properly. You are scheduled for a device check from home on 06/19/2018. You may send your transmission at any time that day. If you have a wireless device, the transmission will be sent automatically. After your physician reviews your transmission, you will receive a postcard with your next transmission date.  Any Other Special Instructions Will Be Listed Below (If Applicable).  If you need a refill on your cardiac medications before your next appointment, please call your pharmacy.

## 2018-05-29 NOTE — Progress Notes (Signed)
PCP: Elfredia NevinsFusco, Lawrence, MD Primary Cardiologist: Dr Antoine PocheHochrein Primary EP: Dr Abe PeopleAllred  Lonnie [name] is a 72 y.o. male who presents today for electrophysiology followup. Recent remote revealed increased ATAF burden.  I therefore had him come in for further evaluation.  Since last being seen in our clinic, the patient reports doing very well.   Denies symptoms of afib.  Today, he denies symptoms of palpitations, chest pain, shortness of breath,  lower extremity edema, dizziness, presyncope, syncope, or ICD shocks.  The patient is otherwise without complaint today.   Past Medical History:  Diagnosis Date  . Atrial tachycardia (HCC)   . CAD (coronary artery disease)    LAD 95% proximal stenosis, D1 50-60% stenosis, the circumflex 40% stenosis, RCA subtotal stenosis.  . Cardiomyopathy, ischemic    EF was 30-35% by echo but 45-50% by cath.  Most recent EF 15%.    . Complication of anesthesia    "Hard time waking me up" after sedation after dental procedure  . Constipation   . H/O cardiac arrest   . High cholesterol   . Hypertension   . Mitral regurgitation    Secondary to papillary muscle rupture  . Persistent atrial fibrillation (HCC)    sp Watchman implant  . Reflux   . Ventricular fibrillation (HCC) 02/16/14   successfully defibrillation by his ICD   Past Surgical History:  Procedure Laterality Date  . CARDIOVERSION N/A 01/25/2014   Procedure: CARDIOVERSION;  Surgeon: Wendall StadePeter C Nishan, MD;  [facility];  Service: Cardiovascular;  Laterality: N/A;  . CORONARY ARTERY BYPASS GRAFT    . ENDOVEIN HARVEST OF GREATER SAPHENOUS VEIN Right 02/05/2013   Procedure: ENDOVEIN HARVEST OF GREATER SAPHENOUS VEIN;  Surgeon: Loreli SlotSteven C Hendrickson, MD;  [facility];  Service: Open Heart Surgery;  Laterality: Right;  . IMPLANTABLE CARDIOVERTER DEFIBRILLATOR IMPLANT  12/03/2013   STJ Fortify ICD implanted for secondary prevention by Dr [name]  . IMPLANTABLE CARDIOVERTER DEFIBRILLATOR IMPLANT N/A  12/03/2013   Procedure: IMPLANTABLE CARDIOVERTER DEFIBRILLATOR IMPLANT;  Surgeon: Gardiner RhymeJames D Login Muckleroy, MD;  [facility];  Service: Cardiovascular;  Laterality: N/A;  . INTRAOPERATIVE TRANSESOPHAGEAL ECHOCARDIOGRAM N/A 02/05/2013   Procedure: INTRAOPERATIVE TRANSESOPHAGEAL ECHOCARDIOGRAM;  Surgeon: Loreli SlotSteven C Hendrickson, MD;  [facility];  Service: Open Heart Surgery;  Laterality: N/A;  . LEFT ATRIAL APPENDAGE OCCLUSION N/A 01/26/2016   Procedure: LEFT ATRIAL APPENDAGE OCCLUSION;  Surgeon: Hillis RangeJames Jacarie Pate, MD;  [facility]: [facility] INVASIVE CV LAB;  Service: Cardiovascular;  Laterality: N/A;  . LEFT HEART CATHETERIZATION WITH CORONARY ANGIOGRAM N/A 02/03/2013   Procedure: LEFT HEART CATHETERIZATION WITH CORONARY ANGIOGRAM;  Surgeon: Iran OuchMuhammad A Arida, MD;  Location: [facility] CATH LAB;  Service: Cardiovascular;  Laterality: N/A;  . LEFT HEART CATHETERIZATION WITH CORONARY/GRAFT ANGIOGRAM N/A 11/01/2013   Procedure: LEFT HEART CATHETERIZATION WITH Isabel CapriceORONARY/GRAFT ANGIOGRAM;  Surgeon: Peter M SwazilandJordan, MD;  [facility];  Service: Cardiovascular;  Laterality: N/A;  . MITRAL VALVE REPLACEMENT (MVR)/CORONARY ARTERY BYPASS GRAFTING (CABG) N/A 02/05/2013   Procedure: MITRAL VALVE REPLACEMENT (MVR)/CORONARY ARTERY BYPASS GRAFTING (CABG);  Surgeon: Loreli SlotSteven C Hendrickson, MD;  [facility];  Service: Open Heart Surgery;  Laterality: N/A;  x3 using right greater saphenous vein and left internal mammary.   Marland Kitchen. PATENT FORAMEN OVALE CLOSURE N/A 02/05/2013   Procedure: PATENT FORAMEN OVALE CLOSURE;  Surgeon: Loreli SlotSteven C Hendrickson, MD;  [facility];  Service: Open Heart Surgery;  Laterality: N/A;  . stomach ulcer repair    . TEE WITHOUT CARDIOVERSION N/A 02/04/2013  Procedure: TRANSESOPHAGEAL ECHOCARDIOGRAM (TEE);  Surgeon: Vesta Mixer, MD;  Location: [facility] ENDOSCOPY;  Service: Cardiovascular;  Laterality: N/A;  . TEE WITHOUT CARDIOVERSION N/A 12/05/2015   Procedure: TRANSESOPHAGEAL ECHOCARDIOGRAM (TEE);  Surgeon: Lewayne Bunting, MD;  Location: [facility] ENDOSCOPY;  Service: Cardiovascular;  Laterality: N/A;  . TEE WITHOUT CARDIOVERSION N/A 03/15/2016   Procedure: TRANSESOPHAGEAL ECHOCARDIOGRAM (TEE);  Surgeon: Chrystie Nose, MD;  Location: [facility] ENDOSCOPY;  Service: Cardiovascular;  Laterality: N/A;    ROS- all systems are reviewed and negative except as per HPI above  Current Outpatient Medications  Medication Sig Dispense Refill  . acetaminophen (TYLENOL) 500 MG tablet Take 1,000 mg by mouth daily as needed for mild pain.     Marland Kitchen aspirin 325 MG tablet Take 325 mg by mouth daily.    Marland Kitchen atorvastatin (LIPITOR) 80 MG tablet TAKE 1 TABLET BY MOUTH ONCE DAILY AT  6  PM 90 tablet 3  . bisoprolol (ZEBETA) 10 MG tablet TAKE ONE TABLET BY MOUTH ONCE DAILY 90 tablet 2  . furosemide (LASIX) 40 MG tablet TAKE 1 TABLET BY MOUTH ONCE DAILY 90 tablet 0  . hydrALAZINE (APRESOLINE) 50 MG tablet TAKE 1 TABLET BY MOUTH TWICE DAILY 180 tablet 1  . isosorbide mononitrate (IMDUR) 60 MG 24 hr tablet TAKE 1 TABLET BY MOUTH ONCE DAILY 90 tablet 2  . nitroGLYCERIN (NITROSTAT) 0.4 MG SL tablet Place 1 tablet (0.4 mg total) under the tongue every 5 (five) minutes as needed for chest pain. 25 tablet 3   No current facility-administered medications for this visit.     Physical Exam: Vitals:   05/29/18 1701  BP: 94/66  Pulse: 86  SpO2: 97%  Weight: 227 lb (103 kg)  Height: 6\' 1"  (1.854 m)    GEN- The patient is well appearing, alert and oriented x 3 today.   Head- normocephalic, atraumatic Eyes-  Sclera clear, conjunctiva pink Ears- hearing intact Oropharynx- clear Lungs- Clear to ausculation bilaterally, normal work of breathing Chest- ICD pocket is well healed Heart- irregular rate and rhythm, no murmurs, rubs or gallops, PMI not laterally displaced GI- soft, NT, ND, + BS Extremities- no clubbing, cyanosis, or edema  ICD interrogation- reviewed in detail today,  See PACEART report  ekg tracing ordered today is personally  reviewed and shows sinus rhythm 86 bpm, PR 192 msec, QRS 138 msec, Qtc 526 msec, IVCD, diffuse TWI  Wt Readings from Last 3 Encounters:  05/29/18 227 lb (103 kg)  11/29/17 231 lb (104.8 kg)  06/14/17 234 lb (106.1 kg)    Assessment and Plan:  1.  Chronic systolic dysfunction euvolemic today Stable on an appropriate medical regimen Normal ICD function See Pace Art report No changes today followed in ICM device clinic  2. Persistent afib S/p watchman  On ASA Afib burden is 11 % (27 % last year) Asymptomatic at this time. Would like to avoid AAD therapy Will follow conservatively  3. HTN Stable No change required today  Merlin Return to see EP NP every year Follow-up with Dr Antoine Poche as scheduled  Hillis Range MD, [facility] 05/29/2018 5:19 PM

## 2018-06-02 NOTE — Addendum Note (Signed)
Addended by: Solon AugustaBOYD, Lashala Laser H on: 06/02/2018 01:47 PM   Modules accepted: Orders

## 2018-06-19 ENCOUNTER — Ambulatory Visit (INDEPENDENT_AMBULATORY_CARE_PROVIDER_SITE_OTHER): Payer: Medicare Other | Admitting: *Deleted

## 2018-06-19 DIAGNOSIS — I255 Ischemic cardiomyopathy: Secondary | ICD-10-CM

## 2018-06-19 NOTE — Progress Notes (Signed)
Remote ICD transmission.   

## 2018-06-22 NOTE — Progress Notes (Signed)
HPI The patient presents for evaluation after CABG and mitral valve replacement. He presented late after myocardial infarction. 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 He ultimately required CABG with bioprosthetic mitral valve replacement. He had a resultant EF of 30-35%.   He suffered cardiac arrest on 2/22 with VT/VF.  Follow up cath demonstrated patent bypass grafts.  He initially went home with a Life Vest.  He subsequently returned for ICD placement on 12/03/13.  In November of that same year his ICD fired while he was at work. Prior to that he had a syncopal episode and suffered a subdural hematoma. He has recovered from this and now is status post Watchman.  His last EF is 20 - 25%.   Earlier this year he was noted to be in NSR.  He had been in what was thought to be chronic atrial fib.  He had his device changed from VVI.   At the last visit with Dr. Johney Frame he was noted to have 11% PAF.    He says he is doing very well.  He does not sound like he is particularly active.  He denies any cardiovascular symptoms such as chest pressure, neck or arm discomfort.  He has had no palpitations, presyncope or syncope.  He said no weight gain or edema.   No Known Allergies  Current Outpatient Medications  Medication Sig Dispense Refill  . acetaminophen (TYLENOL) 500 MG tablet Take 1,000 mg by mouth daily as needed for mild pain.     Marland Kitchen aspirin 325 MG tablet Take 325 mg by mouth daily.    Marland Kitchen atorvastatin (LIPITOR) 80 MG tablet TAKE 1 TABLET BY MOUTH ONCE DAILY AT  6  PM 90 tablet 3  . bisoprolol (ZEBETA) 10 MG tablet TAKE ONE TABLET BY MOUTH ONCE DAILY 90 tablet 2  . furosemide (LASIX) 40 MG tablet TAKE 1 TABLET BY MOUTH ONCE DAILY 90 tablet 0  . hydrALAZINE (APRESOLINE) 50 MG tablet TAKE 1 TABLET BY MOUTH TWICE DAILY 180 tablet 1  . isosorbide mononitrate (IMDUR) 60 MG 24 hr tablet TAKE 1 TABLET BY MOUTH ONCE DAILY 90 tablet 2  . nitroGLYCERIN (NITROSTAT) 0.4 MG SL tablet  Place 1 tablet (0.4 mg total) under the tongue every 5 (five) minutes as needed for chest pain. 25 tablet 3   No current facility-administered medications for this visit.     Past Medical History:  Diagnosis Date  . Atrial tachycardia (HCC)   . CAD (coronary artery disease)    LAD 95% proximal stenosis, D1 50-60% stenosis, the circumflex 40% stenosis, RCA subtotal stenosis.  . Cardiomyopathy, ischemic    EF was 30-35% by echo but 45-50% by cath.  Most recent EF 15%.    . Complication of anesthesia    "Hard time waking me up" after sedation after dental procedure  . Constipation   . H/O cardiac arrest   . High cholesterol   . Hypertension   . Mitral regurgitation    Secondary to papillary muscle rupture  . Persistent atrial fibrillation    sp Watchman implant  . Reflux   . Ventricular fibrillation (HCC) 02/16/14   successfully defibrillation by his ICD    Past Surgical History:  Procedure Laterality Date  . CARDIOVERSION N/A 01/25/2014   Procedure: CARDIOVERSION;  Surgeon: Wendall Stade, MD;  Location[facility] ENDOSCOPY;  Service: Cardiovascular;  Laterality: N/A;  . CORONARY ARTERY BYPASS GRAFT    . ENDOVEIN HARVEST OF GREATER SAPHENOUS VEIN Right 02/05/2013  Procedure: ENDOVEIN HARVEST OF GREATER SAPHENOUS VEIN;  Surgeon: [name], MD;  Location: [facility] OR;  Service: Open Heart Surgery;  Laterality: Right;  . IMPLANTABLE CARDIOVERTER DEFIBRILLATOR IMPLANT  12/03/2013   STJ Fortify ICD implanted for secondary prevention by Dr Johney Frame  . IMPLANTABLE CARDIOVERTER DEFIBRILLATOR IMPLANT N/A 12/03/2013   Procedure: IMPLANTABLE CARDIOVERTER DEFIBRILLATOR IMPLANT;  Surgeon: Gardiner Rhyme, MD;  Location: [facility] CATH LAB;  Service: Cardiovascular;  Laterality: N/A;  . INTRAOPERATIVE TRANSESOPHAGEAL ECHOCARDIOGRAM N/A 02/05/2013   Procedure: INTRAOPERATIVE TRANSESOPHAGEAL ECHOCARDIOGRAM;  Surgeon: [name], MD;  Location: [facility] OR;  Service: Open Heart Surgery;  Laterality: N/A;  .  LEFT ATRIAL APPENDAGE OCCLUSION N/A 01/26/2016   Procedure: LEFT ATRIAL APPENDAGE OCCLUSION;  Surgeon: Hillis Range, MD;  Location: [facility] INVASIVE CV LAB;  Service: Cardiovascular;  Laterality: N/A;  . LEFT HEART CATHETERIZATION WITH CORONARY ANGIOGRAM N/A 02/03/2013   Procedure: LEFT HEART CATHETERIZATION WITH CORONARY ANGIOGRAM;  Surgeon: Iran Ouch, MD;  Location: [facility] CATH LAB;  Service: Cardiovascular;  Laterality: N/A;  . LEFT HEART CATHETERIZATION WITH CORONARY/GRAFT ANGIOGRAM N/A 11/01/2013   Procedure: LEFT HEART CATHETERIZATION WITH Isabel Caprice;  Surgeon: Peter M Swaziland, MD;  Location: [facility] CATH LAB;  Service: Cardiovascular;  Laterality: N/A;  . MITRAL VALVE REPLACEMENT (MVR)/CORONARY ARTERY BYPASS GRAFTING (CABG) N/A 02/05/2013   Procedure: MITRAL VALVE REPLACEMENT (MVR)/CORONARY ARTERY BYPASS GRAFTING (CABG);  Surgeon: [name], MD;  Location: [facility] OR;  Service: Open Heart Surgery;  Laterality: N/A;  x3 using right greater saphenous vein and left internal mammary.   Marland Kitchen PATENT FORAMEN OVALE CLOSURE N/A 02/05/2013   Procedure: PATENT FORAMEN OVALE CLOSURE;  Surgeon: [name], MD;  Location: [facility] OR;  Service: Open Heart Surgery;  Laterality: N/A;  . stomach ulcer repair    . TEE WITHOUT CARDIOVERSION N/A 02/04/2013   Procedure: TRANSESOPHAGEAL ECHOCARDIOGRAM (TEE);  Surgeon: Vesta Mixer, MD;  Location: [facility] ENDOSCOPY;  Service: Cardiovascular;  Laterality: N/A;  . TEE WITHOUT CARDIOVERSION N/A 12/05/2015   Procedure: TRANSESOPHAGEAL ECHOCARDIOGRAM (TEE);  Surgeon: Lewayne Bunting, MD;  [facility]OS[facility];  Service: Cardiovascular;  Laterality: N/A;  . TEE WITHOUT CARDIOVERSION N/A 03/15/2016   Procedure: TRANSESOPHAGEAL ECHOCARDIOGRAM (TEE);  Surgeon: Chrystie Nose, MD;  Location: [facility] ENDOSCOPY;  Service: Cardiovascular;  Laterality: N/A;    ROS:  As stated in the HPI and negative for all other systems.   PHYSICAL EXAM BP 112/70 (BP Location: Left Arm,  Patient Position: Sitting, Cuff Size: Normal)   Pulse 70   Ht 6\' 1"  (1.854 m)   Wt 225 lb (102.1 kg)   BMI 29.69 kg/m   GENERAL:  Well appearing NECK:  No jugular venous distention, waveform within normal limits, carotid upstroke brisk and symmetric, no bruits, no thyromegaly LUNGS:  Clear to auscultation bilaterally CHEST:  Well healed surgical scar and ICD pocket.  HEART:  PMI not displaced or sustained,S1 and S2 within normal limits, no S3, no S4, no clicks, no rubs, soft apical systolic murmur, no diastolic murmurs ABD:  Flat, positive bowel sounds normal in frequency in pitch, no bruits, no rebound, no guarding, no midline pulsatile mass, no hepatomegaly, no splenomegaly EXT:  2 plus pulses throughout, no edema, no cyanosis no clubbing   EKG:  NA  Lab Results  Component Value Date   CREATININE 1.46 (H) 01/27/2016   CREATININE 1.33 (H) 01/23/2016   CREATININE 1.51 (H) 11/28/2015     ASSESSMENT AND PLAN  CARDIOMYOPATHY:     He has not  tolerated ACE inhibitors or ARB because of renal insufficiency.  He was forgetting his third dose of hydralazine every day so that has been twice daily.  I am going to check an echocardiogram as it is been a couple of years.  I might consider going up at least on the Imdur or consider re-challenging with ACE/ARB/Entresto pending his EF.  MVR:    This will be followed up at the time of his echocardiogram.   CAD:    The patient has no new sypmtoms.  No further cardiovascular testing is indicated.  We will continue with aggressive risk reduction and meds as listed.  ATRIAL FIBRILLATION:    He remains on a higher dose of aspirin.  He has Watchman.   No change in therapy.   ICD:   He is up to date with follow up.  It looks like the last report the Afib was less than 1%.    CKD:     I will check a BMET.  RISK REDUCTION:  I will check a lipid profile.

## 2018-06-23 ENCOUNTER — Encounter: Payer: Self-pay | Admitting: Cardiology

## 2018-06-23 ENCOUNTER — Ambulatory Visit: Payer: Medicare Other | Admitting: Cardiology

## 2018-06-23 VITALS — BP 112/70 | HR 70 | Ht 73.0 in | Wt 225.0 lb

## 2018-06-23 DIAGNOSIS — Z952 Presence of prosthetic heart valve: Secondary | ICD-10-CM | POA: Diagnosis not present

## 2018-06-23 DIAGNOSIS — E7800 Pure hypercholesterolemia, unspecified: Secondary | ICD-10-CM

## 2018-06-23 DIAGNOSIS — I480 Paroxysmal atrial fibrillation: Secondary | ICD-10-CM | POA: Diagnosis not present

## 2018-06-23 DIAGNOSIS — Z79899 Other long term (current) drug therapy: Secondary | ICD-10-CM

## 2018-06-23 DIAGNOSIS — I2510 Atherosclerotic heart disease of native coronary artery without angina pectoris: Secondary | ICD-10-CM

## 2018-06-23 DIAGNOSIS — I5022 Chronic systolic (congestive) heart failure: Secondary | ICD-10-CM

## 2018-06-23 LAB — LIPID PANEL
CHOL/HDL RATIO: 2.3 ratio (ref 0.0–5.0)
Cholesterol, Total: 116 mg/dL (ref 100–199)
HDL: 50 mg/dL (ref >39)
LDL CALC: 50 mg/dL (ref 0–99)
Triglycerides: 80 mg/dL (ref 0–149)
VLDL CHOLESTEROL CAL: 16 mg/dL (ref 5–40)

## 2018-06-23 LAB — COMPREHENSIVE METABOLIC PANEL
ALT: 18 IU/L (ref 0–44)
AST: 18 IU/L (ref 0–40)
Albumin/Globulin Ratio: 1.5 (ref 1.2–2.2)
Albumin: 4.1 g/dL (ref 3.5–4.8)
Alkaline Phosphatase: 125 IU/L — ABNORMAL HIGH (ref 39–117)
BUN/Creatinine Ratio: 14 (ref 10–24)
BUN: 20 mg/dL (ref 8–27)
Bilirubin Total: 1 mg/dL (ref 0.0–1.2)
CO2: 24 mmol/L (ref 20–29)
CREATININE: 1.44 mg/dL — AB (ref 0.76–1.27)
Calcium: 9.3 mg/dL (ref 8.6–10.2)
Chloride: 106 mmol/L (ref 96–106)
GFR calc Af Amer: 56 mL/min/{1.73_m2} — ABNORMAL LOW (ref >59)
GFR, EST NON AFRICAN AMERICAN: 48 mL/min/{1.73_m2} — AB (ref >59)
GLOBULIN, TOTAL: 2.7 g/dL (ref 1.5–4.5)
Glucose: 95 mg/dL (ref 65–99)
Potassium: 4.5 mmol/L (ref 3.5–5.2)
SODIUM: 145 mmol/L — AB (ref 134–144)
Total Protein: 6.8 g/dL (ref 6.0–8.5)

## 2018-06-23 LAB — CBC
HEMATOCRIT: 39.6 % (ref 37.5–51.0)
Hemoglobin: 13.2 g/dL (ref 13.0–17.7)
MCH: 28.4 pg (ref 26.6–33.0)
MCHC: 33.3 g/dL (ref 31.5–35.7)
MCV: 85 fL (ref 79–97)
Platelets: 212 10*3/uL (ref 150–450)
RBC: 4.64 x10E6/uL (ref 4.14–5.80)
RDW: 15.8 % — ABNORMAL HIGH (ref 12.3–15.4)
WBC: 8 10*3/uL (ref 3.4–10.8)

## 2018-06-23 NOTE — Patient Instructions (Signed)
Medication Instructions:  Continue current medications  If you need a refill on your cardiac medications before your next appointment, please call your pharmacy.  Labwork: CBC, CMP, Fasting Lipids HERE IN OUR OFFICE AT [facility]  Take the provided lab slips with you to the lab for your blood draw.   You will need to fast. DO NOT EAT OR DRINK PAST MIDNIGHT.    If you have labs (blood work) drawn today and your tests are completely normal, you will receive your results only by: Marland Kitchen MyChart Message (if you have MyChart) OR . A paper copy in the mail If you have any lab test that is abnormal or we need to change your treatment, we will call you to review the results.  Testing/Procedures: Your physician has requested that you have an echocardiogram. Echocardiography is a painless test that uses sound waves to create images of your heart. It provides your doctor with information about the size and shape of your heart and how well your heart's chambers and valves are working. This procedure takes approximately one hour. There are no restrictions for this procedure.  Follow-Up: You will need a follow up appointment in 1 Year.  Please call our office 2 months in [phone]) to schedule the appointment.  You may see  DR Antoine Poche or one of the following Advanced Practice Providers on your designated Care Team:   . Theodore Demark, PA-C . Joni Reining, DNP, ANP  At [facility], you and your health needs are our priority.  As part of our continuing mission to provide you with exceptional heart care, we have created designated Provider Care Teams.  These Care Teams include your primary Cardiologist (physician) and Advanced Practice Providers (APPs -  Physician Assistants and Nurse Practitioners) who all work together to provide you with the care you need, when you need it.    Thank you for choosing [facility] at [facility]!!

## 2018-06-26 ENCOUNTER — Encounter: Payer: Self-pay | Admitting: Cardiology

## 2018-07-03 NOTE — Progress Notes (Signed)
No ICM remote transmission received for 06/30/2018 and next ICM transmission scheduled for 07/15/2018.    

## 2018-07-07 ENCOUNTER — Ambulatory Visit (HOSPITAL_COMMUNITY): Payer: Medicare Other | Attending: Cardiology

## 2018-07-07 ENCOUNTER — Other Ambulatory Visit: Payer: Self-pay

## 2018-07-07 DIAGNOSIS — Z952 Presence of prosthetic heart valve: Secondary | ICD-10-CM

## 2018-07-15 ENCOUNTER — Ambulatory Visit (INDEPENDENT_AMBULATORY_CARE_PROVIDER_SITE_OTHER): Payer: Medicare Other

## 2018-07-15 ENCOUNTER — Telehealth: Payer: Self-pay

## 2018-07-15 DIAGNOSIS — Z95810 Presence of automatic (implantable) cardiac defibrillator: Secondary | ICD-10-CM

## 2018-07-15 DIAGNOSIS — I5022 Chronic systolic (congestive) heart failure: Secondary | ICD-10-CM | POA: Diagnosis not present

## 2018-07-15 NOTE — Progress Notes (Signed)
EPIC Encounter for ICM Monitoring  Patient Name: Lonnie [name] is a 72 y.o. male Date: 07/15/2018 Primary Care Physican: Redmond School, MD Primary Cardiologist:Hochrein Electrophysiologist: Allred Dry Weight:does not weigh at home      Attempted call to patient and unable to reach.   Transmission reviewed.    Thoracic impedance normal.   Prescribed: Furosemide 40 mg 1 tablet daily.(Wife distributes his medications)  Labs: 06/23/2018 Creatinine 1.44, BUN 20, Potassium 4.5, Sodium 145, EGFR 48-56  Recommendations: Unable to reach.  Follow-up plan: ICM clinic phone appointment on 08/18/2018.   Copy of ICM check sent to Dr. Rayann Heman.   3 month ICM trend: 07/15/2018    1 Year ICM trend:       Rosalene Billings, RN 07/15/2018 12:12 PM

## 2018-07-15 NOTE — Telephone Encounter (Signed)
Remote ICM transmission received.  Attempted call to patient regarding ICM remote transmission and mail box is not set up. 

## 2018-07-31 ENCOUNTER — Other Ambulatory Visit: Payer: Self-pay | Admitting: Cardiology

## 2018-08-13 ENCOUNTER — Other Ambulatory Visit: Payer: Self-pay | Admitting: Cardiology

## 2018-08-18 ENCOUNTER — Ambulatory Visit (INDEPENDENT_AMBULATORY_CARE_PROVIDER_SITE_OTHER): Payer: Medicare Other

## 2018-08-18 DIAGNOSIS — I5022 Chronic systolic (congestive) heart failure: Secondary | ICD-10-CM | POA: Diagnosis not present

## 2018-08-18 DIAGNOSIS — Z95810 Presence of automatic (implantable) cardiac defibrillator: Secondary | ICD-10-CM

## 2018-08-19 ENCOUNTER — Telehealth: Payer: Self-pay

## 2018-08-19 NOTE — Progress Notes (Signed)
EPIC Encounter for ICM Monitoring  Patient Name: Lonnie [name] is a 72 y.o. male Date: 08/19/2018 Primary Care Physican: Redmond School, MD Primary Cardiologist:Hochrein Electrophysiologist: Allred Dry Weight:does not weigh at home                                                  Attempted call to patient and unable to reach.   Transmission reviewed.    Thoracic impedance normal.   Prescribed: Furosemide 40 mg 1 tablet daily.(Wife distributes his medications)  Labs: 06/23/2018 Creatinine 1.44, BUN 20, Potassium 4.5, Sodium 145, EGFR 48-56  Recommendations: Unable to reach.  Follow-up plan: ICM clinic phone appointment on 09/25/2018.   Copy of ICM check sent to Dr. Rayann Heman.   3 month ICM trend: 08/18/2018    1 Year ICM trend:       Rosalene Billings, RN 08/19/2018 2:41 PM

## 2018-08-19 NOTE — Telephone Encounter (Signed)
Remote ICM transmission received.  Attempted call to patient regarding ICM remote transmission and no voice mail.

## 2018-08-30 LAB — CUP PACEART REMOTE DEVICE CHECK
Battery Remaining Longevity: 32 mo
Battery Remaining Percentage: 42 %
Battery Voltage: 2.89 V
Date Time Interrogation Session: 20191010101423
HIGH POWER IMPEDANCE MEASURED VALUE: 68 Ohm
HIGH POWER IMPEDANCE MEASURED VALUE: 68 Ohm
Implantable Lead Implant Date: 20150326
Implantable Lead Implant Date: 20150326
Implantable Lead Location: 753860
Implantable Pulse Generator Implant Date: 20150326
Lead Channel Impedance Value: 390 Ohm
Lead Channel Pacing Threshold Pulse Width: 0.5 ms
Lead Channel Sensing Intrinsic Amplitude: 11.7 mV
Lead Channel Setting Pacing Amplitude: 2 V
Lead Channel Setting Sensing Sensitivity: 0.5 mV
MDC IDC LEAD LOCATION: 753859
MDC IDC MSMT LEADCHNL RA IMPEDANCE VALUE: 380 Ohm
MDC IDC MSMT LEADCHNL RA PACING THRESHOLD AMPLITUDE: 0.75 V
MDC IDC MSMT LEADCHNL RA SENSING INTR AMPL: 4.7 mV
MDC IDC MSMT LEADCHNL RV PACING THRESHOLD AMPLITUDE: 1 V
MDC IDC MSMT LEADCHNL RV PACING THRESHOLD PULSEWIDTH: 0.8 ms
MDC IDC SET LEADCHNL RV PACING AMPLITUDE: 2.5 V
MDC IDC SET LEADCHNL RV PACING PULSEWIDTH: 0.8 ms
MDC IDC STAT BRADY AP VP PERCENT: 87 %
MDC IDC STAT BRADY AP VS PERCENT: 1 %
MDC IDC STAT BRADY AS VP PERCENT: 12 %
MDC IDC STAT BRADY AS VS PERCENT: 1 %
MDC IDC STAT BRADY RA PERCENT PACED: 75 %
MDC IDC STAT BRADY RV PERCENT PACED: 89 %
Pulse Gen Serial Number: 7175865

## 2018-09-25 ENCOUNTER — Ambulatory Visit (INDEPENDENT_AMBULATORY_CARE_PROVIDER_SITE_OTHER): Payer: Medicare Other

## 2018-09-25 DIAGNOSIS — I255 Ischemic cardiomyopathy: Secondary | ICD-10-CM | POA: Diagnosis not present

## 2018-09-26 ENCOUNTER — Ambulatory Visit (INDEPENDENT_AMBULATORY_CARE_PROVIDER_SITE_OTHER): Payer: Medicare Other

## 2018-09-26 ENCOUNTER — Telehealth: Payer: Self-pay

## 2018-09-26 DIAGNOSIS — Z95810 Presence of automatic (implantable) cardiac defibrillator: Secondary | ICD-10-CM | POA: Diagnosis not present

## 2018-09-26 DIAGNOSIS — I5022 Chronic systolic (congestive) heart failure: Secondary | ICD-10-CM

## 2018-09-26 NOTE — Telephone Encounter (Signed)
Remote ICM transmission received.  Attempted call to patient regarding ICM remote transmission and mail box is full. 

## 2018-09-26 NOTE — Progress Notes (Signed)
EPIC Encounter for ICM Monitoring  Patient Name: Lonnie [name] is a 73 y.o. male Date: 09/26/2018 Primary Care Physican: Redmond School, MD Primary Cardiologist:Hochrein Electrophysiologist: Allred Dry Weight:does not weigh at home  Attempted call to patient and unable to reach.Transmission reviewed.   Thoracic impedance normal.   Prescribed:Furosemide 40 mg 1 tablet daily.(Wife distributes his medications)  Labs: 10/14/2019Creatinine 1.44, BUN 20, Potassium4.5, Sodium 145, EGFR 48-56  Recommendations:Unable to reach.  Follow-up plan: ICM clinic phone appointment on2/17/2020.   Copy of ICM check sent to Dr.Allred.   3 month ICM trend: 09/25/2018    1 Year ICM trend:       Rosalene Billings, RN 09/26/2018 11:09 AM

## 2018-09-26 NOTE — Progress Notes (Signed)
Remote ICD transmission.   

## 2018-09-27 LAB — CUP PACEART REMOTE DEVICE CHECK
Battery Remaining Longevity: 30 mo
Battery Remaining Percentage: 39 %
Brady Statistic AP VP Percent: 85 %
Brady Statistic AP VS Percent: 1 %
Brady Statistic AS VP Percent: 14 %
Brady Statistic AS VS Percent: 1 %
Brady Statistic RA Percent Paced: 83 %
Date Time Interrogation Session: 20200116070015
HighPow Impedance: 63 Ohm
HighPow Impedance: 63 Ohm
Implantable Lead Implant Date: 20150326
Implantable Lead Location: 753859
Implantable Lead Location: 753860
Implantable Pulse Generator Implant Date: 20150326
Lead Channel Impedance Value: 380 Ohm
Lead Channel Pacing Threshold Amplitude: 1 V
Lead Channel Pacing Threshold Pulse Width: 0.5 ms
Lead Channel Sensing Intrinsic Amplitude: 4.5 mV
Lead Channel Setting Pacing Amplitude: 2 V
Lead Channel Setting Pacing Pulse Width: 0.8 ms
Lead Channel Setting Sensing Sensitivity: 0.5 mV
MDC IDC LEAD IMPLANT DT: 20150326
MDC IDC MSMT BATTERY VOLTAGE: 2.89 V
MDC IDC MSMT LEADCHNL RA PACING THRESHOLD AMPLITUDE: 0.75 V
MDC IDC MSMT LEADCHNL RV IMPEDANCE VALUE: 380 Ohm
MDC IDC MSMT LEADCHNL RV PACING THRESHOLD PULSEWIDTH: 0.8 ms
MDC IDC MSMT LEADCHNL RV SENSING INTR AMPL: 7.3 mV
MDC IDC SET LEADCHNL RV PACING AMPLITUDE: 2.5 V
MDC IDC STAT BRADY RV PERCENT PACED: 97 %
Pulse Gen Serial Number: 7175865

## 2018-10-01 ENCOUNTER — Other Ambulatory Visit: Payer: Self-pay | Admitting: Cardiology

## 2018-10-27 ENCOUNTER — Ambulatory Visit (INDEPENDENT_AMBULATORY_CARE_PROVIDER_SITE_OTHER): Payer: Medicare Other

## 2018-10-27 DIAGNOSIS — I5022 Chronic systolic (congestive) heart failure: Secondary | ICD-10-CM

## 2018-10-27 DIAGNOSIS — Z95810 Presence of automatic (implantable) cardiac defibrillator: Secondary | ICD-10-CM | POA: Diagnosis not present

## 2018-10-28 ENCOUNTER — Telehealth: Payer: Self-pay

## 2018-10-28 NOTE — Progress Notes (Signed)
EPIC Encounter for ICM Monitoring  Patient Name: MEDARD DECUIR is a 73 y.o. male Date: 10/28/2018 Primary Care Physican: Redmond School, MD Primary Cardiologist:Hochrein Electrophysiologist: Allred Dry Weight:does not weigh at home  Attempted call to patient and unable to reach.Transmission reviewed.   Thoracic impedance normal but abnormal 10/23/2018-10/27/2018 suggesting fluid accumulation.   Prescribed:Furosemide 40 mg 1 tablet daily.(Wife distributes his medications)  Labs: 10/14/2019Creatinine 1.44, BUN 20, Potassium4.5, Sodium 145, EGFR 48-56  Recommendations:Unable to reach.  Follow-up plan: ICM clinic phone appointment on3/23/2020.   Copy of ICM check sent to Dr.Allred.  3 month ICM trend: 10/28/2018    1 Year ICM trend:       Rosalene Billings, RN 10/28/2018 9:13 AM

## 2018-10-28 NOTE — Telephone Encounter (Signed)
Remote ICM transmission received.  Attempted call to patient regarding ICM remote transmission and voice mail has not been set up.  

## 2018-11-14 ENCOUNTER — Encounter: Payer: Medicare Other | Admitting: Internal Medicine

## 2018-12-01 ENCOUNTER — Ambulatory Visit (INDEPENDENT_AMBULATORY_CARE_PROVIDER_SITE_OTHER): Payer: Medicare Other

## 2018-12-01 ENCOUNTER — Other Ambulatory Visit: Payer: Self-pay

## 2018-12-01 DIAGNOSIS — Z95810 Presence of automatic (implantable) cardiac defibrillator: Secondary | ICD-10-CM

## 2018-12-01 DIAGNOSIS — I5022 Chronic systolic (congestive) heart failure: Secondary | ICD-10-CM | POA: Diagnosis not present

## 2018-12-03 NOTE — Progress Notes (Signed)
EPIC Encounter for ICM Monitoring  Patient Name: Lonnie [name] is a 73 y.o. male Date: 12/03/2018 Primary Care Physican: Redmond School, MD Primary Cardiologist:Hochrein Electrophysiologist: Allred Dry Weight:does not weigh at home   Transmission reviewed.   Thoracic impedance normal.  Prescribed:Furosemide 40 mg 1 tablet daily.(Wife distributes his medications)  Labs: 10/14/2019Creatinine 1.44, BUN 20, Potassium4.5, Sodium 145, EGFR 48-56  Recommendations:None  Follow-up plan: ICM clinic phone appointment on4/27/2020.   Copy of ICM check sent to Dr.Allred.  3 month ICM trend: 12/01/2018    1 Year ICM trend:       Rosalene Billings, RN 12/03/2018 11:23 AM

## 2018-12-25 ENCOUNTER — Other Ambulatory Visit: Payer: Self-pay

## 2018-12-25 ENCOUNTER — Ambulatory Visit (INDEPENDENT_AMBULATORY_CARE_PROVIDER_SITE_OTHER): Payer: Medicare Other | Admitting: *Deleted

## 2018-12-25 DIAGNOSIS — I255 Ischemic cardiomyopathy: Secondary | ICD-10-CM

## 2018-12-25 LAB — CUP PACEART REMOTE DEVICE CHECK
Battery Remaining Longevity: 28 mo
Battery Remaining Percentage: 36 %
Battery Voltage: 2.87 V
Brady Statistic AP VP Percent: 87 %
Brady Statistic AP VS Percent: 1 %
Brady Statistic AS VP Percent: 12 %
Brady Statistic AS VS Percent: 1 %
Brady Statistic RA Percent Paced: 86 %
Brady Statistic RV Percent Paced: 98 %
Date Time Interrogation Session: 20200416060015
HighPow Impedance: 65 Ohm
HighPow Impedance: 65 Ohm
Implantable Lead Implant Date: 20150326
Implantable Lead Implant Date: 20150326
Implantable Lead Location: 753859
Implantable Lead Location: 753860
Implantable Pulse Generator Implant Date: 20150326
Lead Channel Impedance Value: 380 Ohm
Lead Channel Impedance Value: 390 Ohm
Lead Channel Pacing Threshold Amplitude: 0.75 V
Lead Channel Pacing Threshold Amplitude: 1 V
Lead Channel Pacing Threshold Pulse Width: 0.5 ms
Lead Channel Pacing Threshold Pulse Width: 0.8 ms
Lead Channel Sensing Intrinsic Amplitude: 11.7 mV
Lead Channel Sensing Intrinsic Amplitude: 4 mV
Lead Channel Setting Pacing Amplitude: 2 V
Lead Channel Setting Pacing Amplitude: 2.5 V
Lead Channel Setting Pacing Pulse Width: 0.8 ms
Lead Channel Setting Sensing Sensitivity: 0.5 mV
Pulse Gen Serial Number: 7175865

## 2018-12-31 ENCOUNTER — Encounter: Payer: Self-pay | Admitting: Cardiology

## 2018-12-31 NOTE — Progress Notes (Signed)
Remote ICD transmission.   

## 2019-01-05 ENCOUNTER — Telehealth: Payer: Self-pay

## 2019-01-05 ENCOUNTER — Other Ambulatory Visit: Payer: Self-pay

## 2019-01-05 NOTE — Telephone Encounter (Signed)
Unable to leave message for patient to remind of missed remote transmission.  

## 2019-01-09 NOTE — Progress Notes (Signed)
No ICM remote transmission received for 01/05/2019 and next ICM transmission scheduled for 01/26/2019.   

## 2019-01-26 ENCOUNTER — Ambulatory Visit (INDEPENDENT_AMBULATORY_CARE_PROVIDER_SITE_OTHER): Payer: Medicare Other

## 2019-01-26 ENCOUNTER — Other Ambulatory Visit: Payer: Self-pay

## 2019-01-26 DIAGNOSIS — Z95810 Presence of automatic (implantable) cardiac defibrillator: Secondary | ICD-10-CM

## 2019-01-26 DIAGNOSIS — I5022 Chronic systolic (congestive) heart failure: Secondary | ICD-10-CM | POA: Diagnosis not present

## 2019-01-30 NOTE — Progress Notes (Signed)
EPIC Encounter for ICM Monitoring  Patient Name: Lonnie [name] is a 73 y.o. male Date: 01/30/2019 Primary Care Physican: Redmond School, MD Primary Cardiologist:Hochrein Electrophysiologist: Allred Dry Weight:does not weigh at home   Transmission reviewed and results sent via mychart.  Thoracic impedance normal.  Prescribed:Furosemide 40 mg 1 tablet daily.(Wife distributes his medications)  Labs: 10/14/2019Creatinine 1.44, BUN 20, Potassium4.5, Sodium 145, EGFR 48-56  Recommendations:None  Follow-up plan: ICM clinic phone appointment on6/22/2020.   Copy of ICM check sent to Dr.Allred.  3 month ICM trend: 01/26/2019    1 Year ICM trend:       Rosalene Billings, RN 01/30/2019 8:53 AM

## 2019-02-13 ENCOUNTER — Other Ambulatory Visit: Payer: Self-pay | Admitting: Cardiology

## 2019-03-02 ENCOUNTER — Ambulatory Visit (INDEPENDENT_AMBULATORY_CARE_PROVIDER_SITE_OTHER): Payer: Medicare Other

## 2019-03-02 DIAGNOSIS — I5022 Chronic systolic (congestive) heart failure: Secondary | ICD-10-CM | POA: Diagnosis not present

## 2019-03-02 DIAGNOSIS — Z95810 Presence of automatic (implantable) cardiac defibrillator: Secondary | ICD-10-CM

## 2019-03-06 NOTE — Progress Notes (Signed)
EPIC Encounter for ICM Monitoring  Patient Name: OSWALDO CUETO is a 73 y.o. male Date: 03/06/2019 Primary Care Physican: Redmond School, MD Primary Cardiologist:Hochrein Electrophysiologist: Allred Dry Weight:does not weigh at home   Transmission reviewed and results sent via mychart.  Corvue thoracic impedance normal.  Prescribed:Furosemide 40 mg 1 tablet daily.(Wife distributes his medications)  Labs: 10/14/2019Creatinine 1.44, BUN 20, Potassium4.5, Sodium 145, EGFR 48-56  Recommendations:None  Follow-up plan: ICM clinic phone appointment on 04/06/2019.   Copy of ICM check sent to Dr.Allred.   3 month ICM trend: 03/02/2019    1 Year ICM trend:       Rosalene Billings, RN 03/06/2019 10:00 AM

## 2019-03-25 DIAGNOSIS — R5381 Other malaise: Secondary | ICD-10-CM | POA: Diagnosis not present

## 2019-03-25 DIAGNOSIS — Z7401 Bed confinement status: Secondary | ICD-10-CM | POA: Diagnosis not present

## 2019-03-25 DIAGNOSIS — M2550 Pain in unspecified joint: Secondary | ICD-10-CM | POA: Diagnosis not present

## 2019-03-26 ENCOUNTER — Encounter: Payer: Medicare Other | Admitting: *Deleted

## 2019-03-27 ENCOUNTER — Telehealth: Payer: Self-pay

## 2019-03-27 NOTE — Telephone Encounter (Signed)
Left message for patient to remind of missed remote transmission.  

## 2019-04-02 ENCOUNTER — Encounter: Payer: Self-pay | Admitting: Cardiology

## 2019-04-06 ENCOUNTER — Ambulatory Visit (INDEPENDENT_AMBULATORY_CARE_PROVIDER_SITE_OTHER): Payer: Medicare Other

## 2019-04-06 ENCOUNTER — Telehealth: Payer: Self-pay

## 2019-04-06 DIAGNOSIS — I5022 Chronic systolic (congestive) heart failure: Secondary | ICD-10-CM

## 2019-04-06 DIAGNOSIS — Z95810 Presence of automatic (implantable) cardiac defibrillator: Secondary | ICD-10-CM | POA: Diagnosis not present

## 2019-04-06 NOTE — Progress Notes (Signed)
EPIC Encounter for ICM Monitoring  Patient Name: Lonnie [name] is a 73 y.o. male Date: 04/06/2019 Primary Care Physican: Redmond School, MD Primary Cardiologist:Hochrein Electrophysiologist: Allred Dry Weight:does not weigh at home   Attempted ICM call to patient and no answer.  Transmission reviewedand results sent via mychart.  Corvue thoracic impedance slightly below baseline suggesting possible fluid accumulation since 7/26.  Prescribed:Furosemide 40 mg 1 tablet daily.(Wife distributes his medications)  Labs: 10/14/2019Creatinine 1.44, BUN 20, Potassium4.5, Sodium 145, EGFR 48-56  Recommendations:Unable to reach or leave voice mail message  Follow-up plan: ICM clinic phone appointment on 04/14/2019 to recheck fluid levels.   Copy of ICM check sent to Dr.Allred and Dr Percival Spanish.   3 month ICM trend: 04/06/2019    1 Year ICM trend:       Rosalene Billings, RN 04/06/2019 4:18 PM

## 2019-04-06 NOTE — Telephone Encounter (Signed)
Remote ICM transmission received.  Attempted call to patient regarding ICM remote transmission and voice mail has not been set up.  

## 2019-04-08 DIAGNOSIS — M2550 Pain in unspecified joint: Secondary | ICD-10-CM | POA: Diagnosis not present

## 2019-04-08 DIAGNOSIS — R0902 Hypoxemia: Secondary | ICD-10-CM | POA: Diagnosis not present

## 2019-04-08 DIAGNOSIS — Z7401 Bed confinement status: Secondary | ICD-10-CM | POA: Diagnosis not present

## 2019-04-10 DIAGNOSIS — R531 Weakness: Secondary | ICD-10-CM | POA: Diagnosis not present

## 2019-04-10 DIAGNOSIS — M2550 Pain in unspecified joint: Secondary | ICD-10-CM | POA: Diagnosis not present

## 2019-04-10 DIAGNOSIS — Z7401 Bed confinement status: Secondary | ICD-10-CM | POA: Diagnosis not present

## 2019-04-14 ENCOUNTER — Ambulatory Visit (INDEPENDENT_AMBULATORY_CARE_PROVIDER_SITE_OTHER): Payer: Medicare Other

## 2019-04-14 DIAGNOSIS — Z95810 Presence of automatic (implantable) cardiac defibrillator: Secondary | ICD-10-CM

## 2019-04-14 DIAGNOSIS — I5022 Chronic systolic (congestive) heart failure: Secondary | ICD-10-CM

## 2019-04-17 NOTE — Progress Notes (Signed)
EPIC Encounter for ICM Monitoring  Patient Name: Lonnie [name] is a 73 y.o. male Date: 04/17/2019 Primary Care Physican: Redmond School, MD Primary Cardiologist:Hochrein Electrophysiologist: Allred Dry Weight:does not weigh at home   Transmission reviewedand results sent via mychart.  Corvue thoracic impedance trending slightly below baseline.  Prescribed:Furosemide 40 mg 1 tablet daily.(Wife distributes his medications)  Labs: 10/14/2019Creatinine 1.44, BUN 20, Potassium4.5, Sodium 145, EGFR 48-56  Recommendations: Advised to call if experiencing any fluid symptoms.  Follow-up plan: ICM clinic phone appointment on8/31/2020.  OV with Dr Rayann Heman 05/15/2019.  Copy of ICM check sent to Dr.Allred and Dr Percival Spanish.  3 month ICM trend: 04/14/2019    1 Year ICM trend:       Rosalene Billings, RN 04/17/2019 2:24 PM

## 2019-05-06 DIAGNOSIS — M2550 Pain in unspecified joint: Secondary | ICD-10-CM | POA: Diagnosis not present

## 2019-05-06 DIAGNOSIS — R0902 Hypoxemia: Secondary | ICD-10-CM | POA: Diagnosis not present

## 2019-05-06 DIAGNOSIS — Z7401 Bed confinement status: Secondary | ICD-10-CM | POA: Diagnosis not present

## 2019-05-06 DIAGNOSIS — R5381 Other malaise: Secondary | ICD-10-CM | POA: Diagnosis not present

## 2019-05-06 DIAGNOSIS — I10 Essential (primary) hypertension: Secondary | ICD-10-CM | POA: Diagnosis not present

## 2019-05-06 DIAGNOSIS — R001 Bradycardia, unspecified: Secondary | ICD-10-CM | POA: Diagnosis not present

## 2019-05-12 ENCOUNTER — Other Ambulatory Visit: Payer: Self-pay | Admitting: Cardiology

## 2019-05-13 DIAGNOSIS — Z743 Need for continuous supervision: Secondary | ICD-10-CM | POA: Diagnosis not present

## 2019-05-13 DIAGNOSIS — I10 Essential (primary) hypertension: Secondary | ICD-10-CM | POA: Diagnosis not present

## 2019-05-13 DIAGNOSIS — Z7401 Bed confinement status: Secondary | ICD-10-CM | POA: Diagnosis not present

## 2019-05-13 DIAGNOSIS — R0902 Hypoxemia: Secondary | ICD-10-CM | POA: Diagnosis not present

## 2019-05-13 DIAGNOSIS — M2550 Pain in unspecified joint: Secondary | ICD-10-CM | POA: Diagnosis not present

## 2019-05-13 DIAGNOSIS — R001 Bradycardia, unspecified: Secondary | ICD-10-CM | POA: Diagnosis not present

## 2019-05-13 DIAGNOSIS — I959 Hypotension, unspecified: Secondary | ICD-10-CM | POA: Diagnosis not present

## 2019-05-13 DIAGNOSIS — R279 Unspecified lack of coordination: Secondary | ICD-10-CM | POA: Diagnosis not present

## 2019-05-15 ENCOUNTER — Encounter: Payer: Medicare Other | Admitting: Internal Medicine

## 2019-05-19 NOTE — Progress Notes (Signed)
No ICM remote transmission received for 05/11/2019 and next ICM transmission scheduled for 06/24/2019.

## 2019-05-27 DIAGNOSIS — Z7401 Bed confinement status: Secondary | ICD-10-CM | POA: Diagnosis not present

## 2019-05-27 DIAGNOSIS — I959 Hypotension, unspecified: Secondary | ICD-10-CM | POA: Diagnosis not present

## 2019-05-27 DIAGNOSIS — R001 Bradycardia, unspecified: Secondary | ICD-10-CM | POA: Diagnosis not present

## 2019-05-27 DIAGNOSIS — M2550 Pain in unspecified joint: Secondary | ICD-10-CM | POA: Diagnosis not present

## 2019-05-27 DIAGNOSIS — R0902 Hypoxemia: Secondary | ICD-10-CM | POA: Diagnosis not present

## 2019-06-02 ENCOUNTER — Ambulatory Visit (INDEPENDENT_AMBULATORY_CARE_PROVIDER_SITE_OTHER): Payer: Medicare Other | Admitting: *Deleted

## 2019-06-02 DIAGNOSIS — I480 Paroxysmal atrial fibrillation: Secondary | ICD-10-CM

## 2019-06-02 DIAGNOSIS — I5022 Chronic systolic (congestive) heart failure: Secondary | ICD-10-CM

## 2019-06-02 LAB — CUP PACEART REMOTE DEVICE CHECK
Battery Remaining Longevity: 24 mo
Battery Remaining Percentage: 30 %
Battery Voltage: 2.84 V
Brady Statistic AP VP Percent: 88 %
Brady Statistic AP VS Percent: 1 %
Brady Statistic AS VP Percent: 11 %
Brady Statistic AS VS Percent: 1 %
Brady Statistic RA Percent Paced: 88 %
Brady Statistic RV Percent Paced: 98 %
Date Time Interrogation Session: 20200922060016
HighPow Impedance: 66 Ohm
HighPow Impedance: 66 Ohm
Implantable Lead Implant Date: 20150326
Implantable Lead Implant Date: 20150326
Implantable Lead Location: 753859
Implantable Lead Location: 753860
Implantable Pulse Generator Implant Date: 20150326
Lead Channel Impedance Value: 390 Ohm
Lead Channel Impedance Value: 410 Ohm
Lead Channel Pacing Threshold Amplitude: 0.75 V
Lead Channel Pacing Threshold Amplitude: 1 V
Lead Channel Pacing Threshold Pulse Width: 0.5 ms
Lead Channel Pacing Threshold Pulse Width: 0.8 ms
Lead Channel Sensing Intrinsic Amplitude: 11.7 mV
Lead Channel Sensing Intrinsic Amplitude: 3.2 mV
Lead Channel Setting Pacing Amplitude: 2 V
Lead Channel Setting Pacing Amplitude: 2.5 V
Lead Channel Setting Pacing Pulse Width: 0.8 ms
Lead Channel Setting Sensing Sensitivity: 0.5 mV
Pulse Gen Serial Number: 7175865

## 2019-06-11 NOTE — Progress Notes (Signed)
Remote ICD transmission.   

## 2019-06-22 DIAGNOSIS — R279 Unspecified lack of coordination: Secondary | ICD-10-CM | POA: Diagnosis not present

## 2019-06-22 DIAGNOSIS — Z7401 Bed confinement status: Secondary | ICD-10-CM | POA: Diagnosis not present

## 2019-06-22 DIAGNOSIS — R0902 Hypoxemia: Secondary | ICD-10-CM | POA: Diagnosis not present

## 2019-06-22 DIAGNOSIS — R69 Illness, unspecified: Secondary | ICD-10-CM | POA: Diagnosis not present

## 2019-06-22 DIAGNOSIS — M2550 Pain in unspecified joint: Secondary | ICD-10-CM | POA: Diagnosis not present

## 2019-06-22 DIAGNOSIS — R5381 Other malaise: Secondary | ICD-10-CM | POA: Diagnosis not present

## 2019-06-22 DIAGNOSIS — Z743 Need for continuous supervision: Secondary | ICD-10-CM | POA: Diagnosis not present

## 2019-06-24 ENCOUNTER — Ambulatory Visit (INDEPENDENT_AMBULATORY_CARE_PROVIDER_SITE_OTHER): Payer: Medicare Other

## 2019-06-24 DIAGNOSIS — I5022 Chronic systolic (congestive) heart failure: Secondary | ICD-10-CM

## 2019-06-24 DIAGNOSIS — Z95810 Presence of automatic (implantable) cardiac defibrillator: Secondary | ICD-10-CM

## 2019-06-24 DIAGNOSIS — N182 Chronic kidney disease, stage 2 (mild): Secondary | ICD-10-CM | POA: Insufficient documentation

## 2019-06-24 DIAGNOSIS — I422 Other hypertrophic cardiomyopathy: Secondary | ICD-10-CM | POA: Insufficient documentation

## 2019-06-24 NOTE — Progress Notes (Signed)
HPI The patient presents for evaluation after CABG and mitral valve replacement. He presented late after myocardial infarction. 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 He ultimately required CABG with bioprosthetic mitral valve replacement. He had a resultant EF of 30-35%.   He suffered cardiac arrest on 2/22 with VT/VF.  Follow up cath demonstrated patent bypass grafts.  He initially went home with a Life Vest.  He subsequently returned for ICD placement on 12/03/13.  In November of that same year his ICD fired while he was at work. Prior to that he had a syncopal episode and suffered a subdural hematoma. He has recovered from this and now is status post Watchman.  His last EF is 25%.   At one point  he was noted to be in NSR.  He had been in what was thought to be chronic atrial fib.  He had his device changed from VVI.    Since I last saw him he is done well.  He denies any cardiovascular symptoms.  I reviewed the device interrogation from last month and he has about 1% A. fib but otherwise normal function.  [place] The patient denies any new symptoms such as chest discomfort, neck or arm discomfort. There has been no new shortness of breath, PND or orthopnea. There have been no reported palpitations, presyncope or syncope.     No Known Allergies  Current Outpatient Medications  Medication Sig Dispense Refill  . acetaminophen (TYLENOL) 500 MG tablet Take 1,000 mg by mouth daily as needed for mild pain.     Marland Kitchen aspirin 325 MG tablet Take 325 mg by mouth daily.    Marland Kitchen atorvastatin (LIPITOR) 80 MG tablet TAKE 1 TABLET BY MOUTH ONCE DAILY AT  6PM 90 tablet 3  . bisoprolol (ZEBETA) 10 MG tablet TAKE 1 TABLET BY MOUTH ONCE DAILY 90 tablet 3  . furosemide (LASIX) 40 MG tablet TAKE 1 TABLET BY MOUTH ONCE DAILY 90 tablet 3  . hydrALAZINE (APRESOLINE) 50 MG tablet Take 1 tablet by mouth twice daily 180 tablet 0  . isosorbide mononitrate (IMDUR)  60 MG 24 hr tablet TAKE 1 TABLET BY MOUTH ONCE DAILY 90 tablet 3  . nitroGLYCERIN (NITROSTAT) 0.4 MG SL tablet Place 1 tablet (0.4 mg total) under the tongue every 5 (five) minutes as needed for chest pain. 25 tablet 3   No current facility-administered medications for this visit.     Past Medical History:  Diagnosis Date  . Atrial tachycardia (HCC)   . CAD (coronary artery disease)    LAD 95% proximal stenosis, D1 50-60% stenosis, the circumflex 40% stenosis, RCA subtotal stenosis.  . Cardiomyopathy, ischemic    EF was 30-35% by echo but 45-50% by cath.  Most recent EF 15%.    . Complication of anesthesia    "Hard time waking me up" after sedation after dental procedure  . Constipation   . H/O cardiac arrest   . High cholesterol   . Hypertension   . Mitral regurgitation    Secondary to papillary muscle rupture  . Persistent atrial fibrillation (HCC)    sp Watchman implant  . Reflux   . Ventricular fibrillation (HCC) 02/16/14   successfully defibrillation by his ICD    Past Surgical History:  Procedure Laterality Date  . CARDIOVERSION N/A 01/25/2014   Procedure: CARDIOVERSION;  Surgeon: Wendall Stade, MD;  Location: [facility] ENDOSCOPY;  Service: Cardiovascular;  Laterality: N/A;  . CORONARY ARTERY  BYPASS GRAFT    . ENDOVEIN HARVEST OF GREATER SAPHENOUS VEIN Right 02/05/2013   Procedure: ENDOVEIN HARVEST OF GREATER SAPHENOUS VEIN;  Surgeon: Loreli SlotSteven C Hendrickson, MD;  [facility];  Service: Open Heart Surgery;  Laterality: Right;  . IMPLANTABLE CARDIOVERTER DEFIBRILLATOR IMPLANT  12/03/2013   STJ Fortify ICD implanted for secondary prevention by Dr [name]  . IMPLANTABLE CARDIOVERTER DEFIBRILLATOR IMPLANT N/A 12/03/2013   Procedure: IMPLANTABLE CARDIOVERTER DEFIBRILLATOR IMPLANT;  Surgeon: Gardiner RhymeJames D Allred, MD;  [facility];  Service: Cardiovascular;  Laterality: N/A;  . INTRAOPERATIVE TRANSESOPHAGEAL ECHOCARDIOGRAM N/A 02/05/2013   Procedure: INTRAOPERATIVE TRANSESOPHAGEAL  ECHOCARDIOGRAM;  Surgeon: Loreli SlotSteven C Hendrickson, MD;  [facility];  Service: Open Heart Surgery;  Laterality: N/A;  . LEFT ATRIAL APPENDAGE OCCLUSION N/A 01/26/2016   Procedure: LEFT ATRIAL APPENDAGE OCCLUSION;  Surgeon: Hillis RangeJames Allred, MD;  Location: [facility] INVASIVE CV LAB;  Service: Cardiovascular;  Laterality: N/A;  . LEFT HEART CATHETERIZATION WITH CORONARY ANGIOGRAM N/A 02/03/2013   Procedure: LEFT HEART CATHETERIZATION WITH CORONARY ANGIOGRAM;  Surgeon: Iran OuchMuhammad A Arida, MD;  Location: [facility] CATH LAB;  Service: Cardiovascular;  Laterality: N/A;  . LEFT HEART CATHETERIZATION WITH CORONARY/GRAFT ANGIOGRAM N/A 11/01/2013   Procedure: LEFT HEART CATHETERIZATION WITH Isabel CapriceORONARY/GRAFT ANGIOGRAM;  Surgeon: Peter M SwazilandJordan, MD;  [facility];  Service: Cardiovascular;  Laterality: N/A;  . MITRAL VALVE REPLACEMENT (MVR)/CORONARY ARTERY BYPASS GRAFTING (CABG) N/A 02/05/2013   Procedure: MITRAL VALVE REPLACEMENT (MVR)/CORONARY ARTERY BYPASS GRAFTING (CABG);  Surgeon: Loreli SlotSteven C Hendrickson, MD;  [facility];  Service: Open Heart Surgery;  Laterality: N/A;  x3 using right greater saphenous vein and left internal mammary.   Marland Kitchen. PATENT FORAMEN OVALE CLOSURE N/A 02/05/2013   Procedure: PATENT FORAMEN OVALE CLOSURE;  Surgeon: Loreli SlotSteven C Hendrickson, MD;  [facility];  Service: Open Heart Surgery;  Laterality: N/A;  . stomach ulcer repair    . TEE WITHOUT CARDIOVERSION N/A 02/04/2013   Procedure: TRANSESOPHAGEAL ECHOCARDIOGRAM (TEE);  Surgeon: Vesta MixerPhilip J Nahser, MD;  [facility];  Service: Cardiovascular;  Laterality: N/A;  . TEE WITHOUT CARDIOVERSION N/A 12/05/2015   Procedure: TRANSESOPHAGEAL ECHOCARDIOGRAM (TEE);  Surgeon: Lewayne BuntingBrian S Crenshaw, MD;  [facility];  Service: Cardiovascular;  Laterality: N/A;  . TEE WITHOUT CARDIOVERSION N/A 03/15/2016   Procedure: TRANSESOPHAGEAL ECHOCARDIOGRAM (TEE);  Surgeon: Chrystie NoseKenneth C Hilty, MD;  [facility];  Service: Cardiovascular;  Laterality: N/A;     ROS:  As stated in the HPI and negative for all other systems.   PHYSICAL EXAM BP 112/68   Pulse 65   Temp (!) 96.1 F (35.6 C)   Ht 6\' 1"  (1.854 m)   Wt 223 lb 6.4 oz (101.3 kg)   SpO2 93%   BMI 29.47 kg/m   GENERAL:  Well appearing NECK:  No jugular venous distention, waveform within normal limits, carotid upstroke brisk and symmetric, no bruits, no thyromegaly LUNGS:  Clear to auscultation bilaterally CHEST:  Well healed ICD pocket, Well healed sternotomy scar.  HEART:  PMI not displaced or sustained,S1 and S2 within normal limits, no S3, no S4, no clicks, no rubs, 2 out of 6 apical soft systolic murmur nonradiating, no diastolic murmurs ABD:  Flat, positive bowel sounds normal in frequency in pitch, no bruits, no rebound, no guarding, no midline pulsatile mass, no hepatomegaly, no splenomegaly EXT:  2 plus pulses throughout, no edema, no cyanosis no clubbing   EKG: AV pacing rate 65 100% capture  Lab Results  Component Value Date   CREATININE 1.44 (  H) 06/23/2018   CREATININE 1.46 (H) 01/27/2016   CREATININE 1.33 (H) 01/23/2016     ASSESSMENT AND PLAN  CARDIOMYOPATHY:      The patient is doing well.  He has not tolerated ACE inhibitor or ARB because of renal insufficiency.  He was forgetting his third dose of hydralazine.  He has had class I symptoms essentially.  At this point I am not can change any therapy as he is doing well.   MVR:   He had a stable MVR last year and I will follow-up with an echo next year.   CAD:   He has had no chest discomfort.  No further ischemia work-up is suggested.  ATRIAL FIBRILLATION:    He has a Forensic scientist.  He has a Forensic scientist.  He has had a previous subdural hematoma.  He remains on high-dose aspirin which I agree with.  He is not having any symptoms with his paroxysms of A. fib.  No change in therapy.  ICD:   He had normal ICD function on interrogation in Sept. he is up-to-date with follow-up.   CKD:     Creat was 1.44 last year.  I  will check a basic metabolic profile.   OTHER: WE will give him a flu shot.

## 2019-06-26 ENCOUNTER — Ambulatory Visit: Payer: Medicare Other | Admitting: Cardiology

## 2019-06-26 ENCOUNTER — Encounter: Payer: Self-pay | Admitting: Cardiology

## 2019-06-26 ENCOUNTER — Other Ambulatory Visit: Payer: Self-pay

## 2019-06-26 VITALS — BP 112/68 | HR 65 | Temp 96.1°F | Ht 73.0 in | Wt 223.4 lb

## 2019-06-26 DIAGNOSIS — I2510 Atherosclerotic heart disease of native coronary artery without angina pectoris: Secondary | ICD-10-CM | POA: Diagnosis not present

## 2019-06-26 DIAGNOSIS — N182 Chronic kidney disease, stage 2 (mild): Secondary | ICD-10-CM

## 2019-06-26 DIAGNOSIS — Z9889 Other specified postprocedural states: Secondary | ICD-10-CM | POA: Diagnosis not present

## 2019-06-26 DIAGNOSIS — I422 Other hypertrophic cardiomyopathy: Secondary | ICD-10-CM

## 2019-06-26 DIAGNOSIS — Z79899 Other long term (current) drug therapy: Secondary | ICD-10-CM

## 2019-06-26 DIAGNOSIS — Z23 Encounter for immunization: Secondary | ICD-10-CM | POA: Diagnosis not present

## 2019-06-26 DIAGNOSIS — I4891 Unspecified atrial fibrillation: Secondary | ICD-10-CM | POA: Diagnosis not present

## 2019-06-26 NOTE — Progress Notes (Signed)
EPIC Encounter for ICM Monitoring  Patient Name: Lonnie [name] is a 73 y.o. male Date: 06/26/2019 Primary Care Physican: Redmond School, MD Primary Cardiologist:Hochrein Electrophysiologist: Allred Dry Weight:does not weigh at home   Transmission reviewed.  Corvue thoracic impedance trendingslightly below baseline suggestive of possible fluid accumulation.  Prescribed:Furosemide 40 mg 1 tablet daily.(Wife distributes his medications)  Labs: 10/14/2019Creatinine 1.44, BUN 20, Potassium4.5, Sodium 145, EGFR 48-56  Recommendations: Patient had OV today with Dr Percival Spanish.  Follow-up plan: ICM clinic phone appointment on10/26/2020 to recheck fluid levels.   Copy of ICM check sent to Dr.Allredand Dr Percival Spanish.  3 month ICM trend: 06/24/2019    1 Year ICM trend:       Rosalene Billings, RN 06/26/2019 2:36 PM

## 2019-06-26 NOTE — Patient Instructions (Signed)
Medication Instructions:  The current medical regimen is effective;  continue present plan and medications as directed. Please refer to the Current Medication list given to you today. If you need a refill on your cardiac medications before your next appointment, please call your pharmacy.  Labwork: BMET CBC TODAY HERE IN OUR OFFICE AT [facility]   If you have labs (blood work) drawn today and your tests are completely normal, you will receive your results only by: Marland Kitchen MyChart Message (if you have MyChart) OR . A paper copy in the mail If you have any lab test that is abnormal or we need to change your treatment, we will call you to review the results.  Follow-Up: IN  12 months. Either In Person or Virtual, you may see Minus Breeding, MD or one of the following Advanced Practice Providers on your designated Care Team:  Rosaria Ferries, PA-C  Jory Sims, DNP, ANP Cadence Kathlen Mody, NP  Please call our office in advance, [place] to schedule this October 2021 appointment.   At [facility], you and your health needs are our priority.  As part of our continuing mission to provide you with exceptional heart care, we have created designated Provider Care Teams.  These Care Teams include your primary Cardiologist (physician) and Advanced Practice Providers (APPs -  Physician Assistants and Nurse Practitioners) who all work together to provide you with the care you need, when you need it.  Thank you for choosing [facility] at [facility]!!

## 2019-06-27 LAB — CBC
Hematocrit: 38.2 % (ref 37.5–51.0)
Hemoglobin: 12.7 g/dL — ABNORMAL LOW (ref 13.0–17.7)
MCH: 28.5 pg (ref 26.6–33.0)
MCHC: 33.2 g/dL (ref 31.5–35.7)
MCV: 86 fL (ref 79–97)
Platelets: 217 10*3/uL (ref 150–450)
RBC: 4.46 x10E6/uL (ref 4.14–5.80)
RDW: 15.1 % (ref 11.6–15.4)
WBC: 8.4 10*3/uL (ref 3.4–10.8)

## 2019-06-27 LAB — BASIC METABOLIC PANEL
BUN/Creatinine Ratio: 14 (ref 10–24)
BUN: 22 mg/dL (ref 8–27)
CO2: 24 mmol/L (ref 20–29)
Calcium: 8.9 mg/dL (ref 8.6–10.2)
Chloride: 105 mmol/L (ref 96–106)
Creatinine, Ser: 1.57 mg/dL — ABNORMAL HIGH (ref 0.76–1.27)
GFR calc Af Amer: 50 mL/min/{1.73_m2} — ABNORMAL LOW (ref >59)
GFR calc non Af Amer: 43 mL/min/{1.73_m2} — ABNORMAL LOW (ref >59)
Glucose: 79 mg/dL (ref 65–99)
Potassium: 4.4 mmol/L (ref 3.5–5.2)
Sodium: 144 mmol/L (ref 134–144)

## 2019-07-06 ENCOUNTER — Ambulatory Visit (INDEPENDENT_AMBULATORY_CARE_PROVIDER_SITE_OTHER): Payer: Medicare Other

## 2019-07-06 DIAGNOSIS — I5022 Chronic systolic (congestive) heart failure: Secondary | ICD-10-CM

## 2019-07-06 DIAGNOSIS — Z95810 Presence of automatic (implantable) cardiac defibrillator: Secondary | ICD-10-CM

## 2019-07-07 NOTE — Progress Notes (Signed)
EPIC Encounter for ICM Monitoring  Patient Name: Lonnie [name] is a 73 y.o. male Date: 07/07/2019 Primary Care Physican: Redmond School, MD Primary Cardiologist:Hochrein Electrophysiologist: Allred Dry Weight:does not weigh at home  Transmission reviewed.  Corvue thoracic impedancereturned close to normal since 06/24/2019 remote transmission.  Prescribed:Furosemide 40 mg 1 tablet daily.(Wife distributes his medications)  Labs: 10/14/2019Creatinine 1.44, BUN 20, Potassium4.5, Sodium 145, EGFR 48-56  Recommendations: None  Follow-up plan: ICM clinic phone appointment on11/16/2020.  OV with Dr Rayann Heman on 08/05/2019.  Copy of ICM check sent to Dr.Allred  3 month ICM trend: 07/06/2019    1 Year ICM trend:       Rosalene Billings, RN 07/07/2019 3:52 PM

## 2019-07-19 DIAGNOSIS — R0902 Hypoxemia: Secondary | ICD-10-CM | POA: Diagnosis not present

## 2019-07-19 DIAGNOSIS — R404 Transient alteration of awareness: Secondary | ICD-10-CM | POA: Diagnosis not present

## 2019-07-19 DIAGNOSIS — Z743 Need for continuous supervision: Secondary | ICD-10-CM | POA: Diagnosis not present

## 2019-07-19 DIAGNOSIS — R1900 Intra-abdominal and pelvic swelling, mass and lump, unspecified site: Secondary | ICD-10-CM | POA: Diagnosis not present

## 2019-07-24 ENCOUNTER — Encounter: Payer: Self-pay | Admitting: Internal Medicine

## 2019-07-26 ENCOUNTER — Other Ambulatory Visit: Payer: Self-pay | Admitting: Cardiology

## 2019-07-27 ENCOUNTER — Ambulatory Visit (INDEPENDENT_AMBULATORY_CARE_PROVIDER_SITE_OTHER): Payer: Medicare Other

## 2019-07-27 DIAGNOSIS — Z95810 Presence of automatic (implantable) cardiac defibrillator: Secondary | ICD-10-CM | POA: Diagnosis not present

## 2019-07-27 DIAGNOSIS — I5022 Chronic systolic (congestive) heart failure: Secondary | ICD-10-CM

## 2019-07-29 NOTE — Progress Notes (Signed)
EPIC Encounter for ICM Monitoring  Patient Name: Lonnie [name] is a 73 y.o. male Date: 07/29/2019 Primary Care Physican: Redmond School, MD Primary Cardiologist:Hochrein Electrophysiologist: Allred Dry Weight:does not weigh at home  Transmission reviewed.  Corvue thoracic impedance normal.  Prescribed:Furosemide 40 mg 1 tablet daily.(Wife distributes his medications)  Labs: 10/14/2019Creatinine 1.44, BUN 20, Potassium4.5, Sodium 145, EGFR 48-56  Recommendations: None  Follow-up plan: ICM clinic phone appointment on 09/02/2019.  91 day device clinic remote transmission 09/01/2019.  OV with Dr Rayann Heman on 08/05/2019.  Copy of ICM check sent to Dr.Allred  Direct Trend View through 07/26/2019.    Rosalene Billings, RN 07/29/2019 3:01 PM

## 2019-08-01 ENCOUNTER — Other Ambulatory Visit: Payer: Self-pay | Admitting: Cardiology

## 2019-08-05 ENCOUNTER — Telehealth (INDEPENDENT_AMBULATORY_CARE_PROVIDER_SITE_OTHER): Payer: Medicare Other | Admitting: Internal Medicine

## 2019-08-05 DIAGNOSIS — Z9889 Other specified postprocedural states: Secondary | ICD-10-CM | POA: Diagnosis not present

## 2019-08-05 DIAGNOSIS — I480 Paroxysmal atrial fibrillation: Secondary | ICD-10-CM

## 2019-08-05 DIAGNOSIS — I10 Essential (primary) hypertension: Secondary | ICD-10-CM | POA: Diagnosis not present

## 2019-08-05 DIAGNOSIS — I5022 Chronic systolic (congestive) heart failure: Secondary | ICD-10-CM

## 2019-08-05 NOTE — Progress Notes (Signed)
Electrophysiology TeleHealth Note  Due to national recommendations of social distancing due to COVID 19, an audio telehealth visit is felt to be most appropriate for this patient at this time.  Verbal consent was obtained by me for the telehealth visit today.  The patient does not have capability for a virtual visit.  A phone visit is therefore required today.   Date:  08/05/2019   ID:  Lonnie [name], DOB May 05, 1946, MRN [id number]  Location: patient's home  Provider location:  [facility]  Evaluation Performed: Follow-up visit  PCP:  Lonnie Nevins, MD   Electrophysiologist:  Dr Johney Frame  Chief Complaint:  CHF  History of Present Illness:    Lonnie [name] is a 73 y.o. male who presents via telehealth conferencing today.  Since last being seen in our clinic, the patient reports doing very well.  Today, he denies symptoms of palpitations, chest pain, shortness of breath,  lower extremity edema, dizziness, presyncope, or syncope.  The patient is otherwise without complaint today.  The patient denies symptoms of fevers, chills, cough, or new SOB worrisome for COVID 19.  Past Medical History:  Diagnosis Date  . Atrial tachycardia (HCC)   . CAD (coronary artery disease)    LAD 95% proximal stenosis, D1 50-60% stenosis, the circumflex 40% stenosis, RCA subtotal stenosis.  . Cardiomyopathy, ischemic    EF was 30-35% by echo but 45-50% by cath.  Most recent EF 15%.    . Complication of anesthesia    "Hard time waking me up" after sedation after dental procedure  . Constipation   . H/O cardiac arrest   . High cholesterol   . Hypertension   . Mitral regurgitation    Secondary to papillary muscle rupture  . Persistent atrial fibrillation (HCC)    sp Watchman implant  . Reflux   . Ventricular fibrillation (HCC) 02/16/14   successfully defibrillation by his ICD    Past Surgical History:  Procedure Laterality Date  . CARDIOVERSION N/A 01/25/2014   Procedure: CARDIOVERSION;   Surgeon: Wendall Stade, MD;  Location: [facility] ENDOSCOPY;  Service: Cardiovascular;  Laterality: N/A;  . CORONARY ARTERY BYPASS GRAFT    . ENDOVEIN HARVEST OF GREATER SAPHENOUS VEIN Right 02/05/2013   Procedure: ENDOVEIN HARVEST OF GREATER SAPHENOUS VEIN;  Surgeon: [name], MD;  Location: [facility] OR;  Service: Open Heart Surgery;  Laterality: Right;  . IMPLANTABLE CARDIOVERTER DEFIBRILLATOR IMPLANT  12/03/2013   STJ Fortify ICD implanted for secondary prevention by Dr Johney Frame  . IMPLANTABLE CARDIOVERTER DEFIBRILLATOR IMPLANT N/A 12/03/2013   Procedure: IMPLANTABLE CARDIOVERTER DEFIBRILLATOR IMPLANT;  Surgeon: Gardiner Rhyme, MD;  Location: [facility] CATH LAB;  Service: Cardiovascular;  Laterality: N/A;  . INTRAOPERATIVE TRANSESOPHAGEAL ECHOCARDIOGRAM N/A 02/05/2013   Procedure: INTRAOPERATIVE TRANSESOPHAGEAL ECHOCARDIOGRAM;  Surgeon: [name], MD;  Location: [facility] OR;  Service: Open Heart Surgery;  Laterality: N/A;  . LEFT ATRIAL APPENDAGE OCCLUSION N/A 01/26/2016   Procedure: LEFT ATRIAL APPENDAGE OCCLUSION;  Surgeon: Hillis Range, MD;  Location: [facility] INVASIVE CV LAB;  Service: Cardiovascular;  Laterality: N/A;  . LEFT HEART CATHETERIZATION WITH CORONARY ANGIOGRAM N/A 02/03/2013   Procedure: LEFT HEART CATHETERIZATION WITH CORONARY ANGIOGRAM;  Surgeon: Iran Ouch, MD;  Location: [facility] CATH LAB;  Service: Cardiovascular;  Laterality: N/A;  . LEFT HEART CATHETERIZATION WITH CORONARY/GRAFT ANGIOGRAM N/A 11/01/2013   Procedure: LEFT HEART CATHETERIZATION WITH Isabel Caprice;  Surgeon: Peter M Swaziland, MD;  Location: [facility] CATH LAB;  Service: Cardiovascular;  Laterality: N/A;  .  MITRAL VALVE REPLACEMENT (MVR)/CORONARY ARTERY BYPASS GRAFTING (CABG) N/A 02/05/2013   Procedure: MITRAL VALVE REPLACEMENT (MVR)/CORONARY ARTERY BYPASS GRAFTING (CABG);  Surgeon: Melrose Nakayama, MD;  Location: [place];  Service: Open Heart Surgery;  Laterality: N/A;  x3 using right greater saphenous vein and left  internal mammary.   Marland Kitchen PATENT FORAMEN OVALE CLOSURE N/A 02/05/2013   Procedure: PATENT FORAMEN OVALE CLOSURE;  Surgeon: Melrose Nakayama, MD;  Location: [place];  Service: Open Heart Surgery;  Laterality: N/A;  . stomach ulcer repair    . TEE WITHOUT CARDIOVERSION N/A 02/04/2013   Procedure: TRANSESOPHAGEAL ECHOCARDIOGRAM (TEE);  Surgeon: Thayer Headings, MD;  Location: [place];  Service: Cardiovascular;  Laterality: N/A;  . TEE WITHOUT CARDIOVERSION N/A 12/05/2015   Procedure: TRANSESOPHAGEAL ECHOCARDIOGRAM (TEE);  Surgeon: Lelon Perla, MD;  Location: [facility] ENDOSCOPY;  Service: Cardiovascular;  Laterality: N/A;  . TEE WITHOUT CARDIOVERSION N/A 03/15/2016   Procedure: TRANSESOPHAGEAL ECHOCARDIOGRAM (TEE);  Surgeon: Pixie Casino, MD;  Location: [facility] ENDOSCOPY;  Service: Cardiovascular;  Laterality: N/A;    Current Outpatient Medications  Medication Sig Dispense Refill  . acetaminophen (TYLENOL) 500 MG tablet Take 1,000 mg by mouth daily as needed for mild pain.     Marland Kitchen aspirin 325 MG tablet Take 325 mg by mouth daily.    Marland Kitchen atorvastatin (LIPITOR) 80 MG tablet TAKE 1 TABLET BY MOUTH ONCE DAILY AT  6PM 90 tablet 0  . bisoprolol (ZEBETA) 10 MG tablet Take 1 tablet by mouth once daily 90 tablet 0  . furosemide (LASIX) 40 MG tablet TAKE 1 TABLET BY MOUTH ONCE DAILY 90 tablet 3  . hydrALAZINE (APRESOLINE) 50 MG tablet Take 1 tablet by mouth twice daily 180 tablet 0  . isosorbide mononitrate (IMDUR) 60 MG 24 hr tablet TAKE 1 TABLET BY MOUTH ONCE DAILY 90 tablet 3  . nitroGLYCERIN (NITROSTAT) 0.4 MG SL tablet Place 1 tablet (0.4 mg total) under the tongue every 5 (five) minutes as needed for chest pain. 25 tablet 3   No current facility-administered medications for this visit.     Allergies:   Patient has no known allergies.   Social History:  The patient  reports that he quit smoking about 45 years ago. His smoking use included cigarettes. He has never used smokeless tobacco. He reports current  alcohol use. He reports that he does not use drugs.   Family History:  The patient's family history includes Heart attack in his brother; Heart attack (age of onset: 78) in his mother.   ROS:  Please see the history of present illness.   All other systems are personally reviewed and negative.    Exam:    Vital Signs:  There were no vitals taken for this visit.  Well sounding, alert and conversant   Labs/Other Tests and Data Reviewed:    Recent Labs: 06/26/2019: BUN 22; Creatinine, Ser 1.57; Hemoglobin 12.7; Platelets 217; Potassium 4.4; Sodium 144   Wt Readings from Last 3 Encounters:  06/26/19 223 lb 6.4 oz (101.3 kg)  06/23/18 225 lb (102.1 kg)  05/29/18 227 lb (103 kg)     Last device remote is reviewed from [place] PDF which reveals normal device function, no arrhythmias   ASSESSMENT & PLAN:    1.  Chronic systolic dysfunction Doing very well ICD function is normal Followed in ICM clinic by Sharman Cheek   2. afib Doing very well (burden <1%) S/p Watchman  3. HTN Stable No change required today  4. S/p MVR  Follow-up:  Return in a year   Patient Risk:  after full review of this patients clinical status, I feel that they are at moderate risk at this time.  Today, I have spent 15 minutes with the patient with telehealth technology discussing arrhythmia management .    Randolm IdolSigned, Aviel Davalos, MD  08/05/2019 8:46 AM     [facility] [address] [place] [address] [phone] (office) [phone] (fax)

## 2019-08-08 ENCOUNTER — Other Ambulatory Visit: Payer: Self-pay | Admitting: Cardiology

## 2019-09-01 ENCOUNTER — Ambulatory Visit (INDEPENDENT_AMBULATORY_CARE_PROVIDER_SITE_OTHER): Payer: Medicare Other | Admitting: *Deleted

## 2019-09-01 DIAGNOSIS — I480 Paroxysmal atrial fibrillation: Secondary | ICD-10-CM

## 2019-09-01 LAB — CUP PACEART REMOTE DEVICE CHECK
Battery Remaining Longevity: 22 mo
Battery Remaining Percentage: 28 %
Battery Voltage: 2.84 V
Brady Statistic AP VP Percent: 89 %
Brady Statistic AP VS Percent: 1 %
Brady Statistic AS VP Percent: 10 %
Brady Statistic AS VS Percent: 1 %
Brady Statistic RA Percent Paced: 89 %
Brady Statistic RV Percent Paced: 98 %
Date Time Interrogation Session: 20201222055623
HighPow Impedance: 62 Ohm
HighPow Impedance: 62 Ohm
Implantable Lead Implant Date: 20150326
Implantable Lead Implant Date: 20150326
Implantable Lead Location: 753859
Implantable Lead Location: 753860
Implantable Pulse Generator Implant Date: 20150326
Lead Channel Impedance Value: 360 Ohm
Lead Channel Impedance Value: 360 Ohm
Lead Channel Pacing Threshold Amplitude: 0.75 V
Lead Channel Pacing Threshold Amplitude: 1 V
Lead Channel Pacing Threshold Pulse Width: 0.5 ms
Lead Channel Pacing Threshold Pulse Width: 0.8 ms
Lead Channel Sensing Intrinsic Amplitude: 3.3 mV
Lead Channel Sensing Intrinsic Amplitude: 8.1 mV
Lead Channel Setting Pacing Amplitude: 2 V
Lead Channel Setting Pacing Amplitude: 2.5 V
Lead Channel Setting Pacing Pulse Width: 0.8 ms
Lead Channel Setting Sensing Sensitivity: 0.5 mV
Pulse Gen Serial Number: 7175865

## 2019-09-02 ENCOUNTER — Ambulatory Visit (INDEPENDENT_AMBULATORY_CARE_PROVIDER_SITE_OTHER): Payer: Medicare Other

## 2019-09-02 DIAGNOSIS — I5022 Chronic systolic (congestive) heart failure: Secondary | ICD-10-CM | POA: Diagnosis not present

## 2019-09-02 DIAGNOSIS — Z95810 Presence of automatic (implantable) cardiac defibrillator: Secondary | ICD-10-CM

## 2019-09-02 NOTE — Progress Notes (Signed)
EPIC Encounter for ICM Monitoring  Patient Name: Lonnie [name] is a 73 y.o. male Date: 09/02/2019 Primary Care Physican: Redmond School, MD Primary Cardiologist:Hochrein Electrophysiologist: Allred Dry Weight:does not weigh at home  Transmission reviewed.  Corvue thoracic impedance normal.  Prescribed:Furosemide 40 mg 1 tablet daily.(Wife distributes his medications)  Labs: 10/16/2020Creatinine 1.57, BUN 22, Potassium4.4, Sodium 144, EGFR 43-50  Recommendations: None  Follow-up plan: ICM clinic phone appointment on 10/05/2019. 91 day device clinic remote transmission 12/01/2019.    Copy of ICM check sent to Dr.Allred  3 month ICM trend: 09/02/2019    1 Year ICM trend:       Rosalene Billings, RN 09/02/2019 9:25 AM

## 2019-09-16 ENCOUNTER — Other Ambulatory Visit: Payer: Self-pay | Admitting: Cardiology

## 2019-10-05 ENCOUNTER — Ambulatory Visit (INDEPENDENT_AMBULATORY_CARE_PROVIDER_SITE_OTHER): Payer: Medicare Other

## 2019-10-05 DIAGNOSIS — Z95810 Presence of automatic (implantable) cardiac defibrillator: Secondary | ICD-10-CM | POA: Diagnosis not present

## 2019-10-05 DIAGNOSIS — I5022 Chronic systolic (congestive) heart failure: Secondary | ICD-10-CM

## 2019-10-09 NOTE — Progress Notes (Signed)
EPIC Encounter for ICM Monitoring  Patient Name: Lonnie [name] is a 73 y.o. male Date: 10/09/2019 Primary Care Physican: Fusco, Lawrence, MD Primary Cardiologist:Hochrein Electrophysiologist: Allred Dry Weight:does not weigh at home  Transmission reviewed.  Corvue thoracic impedancenormal.  Prescribed:Furosemide 40 mg 1 tablet daily.(Wife distributes his medications)  Labs: 10/16/2020Creatinine 1.57, BUN 22, Potassium4.4, Sodium 144, EGFR 43-50  Recommendations: None  Follow-up plan: ICM clinic phone appointment on3/09/2019.91 day device clinic remote transmission 12/01/2019.  Copy of ICM check sent to Dr.Allred  3 month ICM tr1end: 10/06/2019    1 Year ICM trend:        S , RN 10/09/2019 9:32 AM   

## 2019-10-23 ENCOUNTER — Other Ambulatory Visit: Payer: Self-pay | Admitting: Cardiology

## 2019-10-31 ENCOUNTER — Other Ambulatory Visit: Payer: Self-pay | Admitting: Cardiology

## 2019-11-09 ENCOUNTER — Ambulatory Visit (INDEPENDENT_AMBULATORY_CARE_PROVIDER_SITE_OTHER): Payer: Medicare Other

## 2019-11-09 DIAGNOSIS — Z95810 Presence of automatic (implantable) cardiac defibrillator: Secondary | ICD-10-CM

## 2019-11-09 DIAGNOSIS — I5022 Chronic systolic (congestive) heart failure: Secondary | ICD-10-CM

## 2019-11-13 NOTE — Progress Notes (Signed)
EPIC Encounter for ICM Monitoring  Patient Name: Lonnie [name] is a 74 y.o. male Date: 11/13/2019 Primary Care Physican: Redmond School, MD Primary Cardiologist:Hochrein Electrophysiologist: Allred Dry Weight:does not weigh at home  AT/AF Burden 19%  Transmission reviewed.  Corvue thoracic impedancenormal.  Prescribed:Furosemide 40 mg 1 tablet daily.(Wife distributes his medications)  Labs: 10/16/2020Creatinine 1.57, BUN 22, Potassium4.4, Sodium 144, EGFR 43-50  Recommendations: None  Follow-up plan: ICM clinic phone appointment on 12/14/2019.91 day device clinic remote transmission3/23/2021.  Copy of ICM check sent to Dr.Allred  3 month ICM trend: 11/09/2019    1 Year ICM trend:       Rosalene Billings, RN 11/13/2019 2:58 PM

## 2019-12-01 ENCOUNTER — Ambulatory Visit (INDEPENDENT_AMBULATORY_CARE_PROVIDER_SITE_OTHER): Payer: Medicare Other | Admitting: *Deleted

## 2019-12-01 DIAGNOSIS — I480 Paroxysmal atrial fibrillation: Secondary | ICD-10-CM | POA: Diagnosis not present

## 2019-12-01 LAB — CUP PACEART REMOTE DEVICE CHECK
Battery Remaining Longevity: 20 mo
Battery Remaining Percentage: 27 %
Battery Voltage: 2.83 V
Brady Statistic AP VP Percent: 89 %
Brady Statistic AP VS Percent: 1 %
Brady Statistic AS VP Percent: 9.6 %
Brady Statistic AS VS Percent: 1 %
Brady Statistic RA Percent Paced: 89 %
Brady Statistic RV Percent Paced: 98 %
Date Time Interrogation Session: 20210323052903
HighPow Impedance: 65 Ohm
HighPow Impedance: 65 Ohm
Implantable Lead Implant Date: 20150326
Implantable Lead Implant Date: 20150326
Implantable Lead Location: 753859
Implantable Lead Location: 753860
Implantable Pulse Generator Implant Date: 20150326
Lead Channel Impedance Value: 350 Ohm
Lead Channel Impedance Value: 360 Ohm
Lead Channel Pacing Threshold Amplitude: 0.75 V
Lead Channel Pacing Threshold Amplitude: 1 V
Lead Channel Pacing Threshold Pulse Width: 0.5 ms
Lead Channel Pacing Threshold Pulse Width: 0.8 ms
Lead Channel Sensing Intrinsic Amplitude: 3.3 mV
Lead Channel Sensing Intrinsic Amplitude: 7.3 mV
Lead Channel Setting Pacing Amplitude: 2 V
Lead Channel Setting Pacing Amplitude: 2.5 V
Lead Channel Setting Pacing Pulse Width: 0.8 ms
Lead Channel Setting Sensing Sensitivity: 0.5 mV
Pulse Gen Serial Number: 7175865

## 2019-12-02 NOTE — Progress Notes (Signed)
ICD Remote  

## 2019-12-14 ENCOUNTER — Ambulatory Visit (INDEPENDENT_AMBULATORY_CARE_PROVIDER_SITE_OTHER): Payer: Medicare Other

## 2019-12-14 ENCOUNTER — Other Ambulatory Visit: Payer: Self-pay | Admitting: Cardiology

## 2019-12-14 DIAGNOSIS — I5022 Chronic systolic (congestive) heart failure: Secondary | ICD-10-CM

## 2019-12-14 DIAGNOSIS — Z95810 Presence of automatic (implantable) cardiac defibrillator: Secondary | ICD-10-CM | POA: Diagnosis not present

## 2019-12-15 NOTE — Progress Notes (Signed)
EPIC Encounter for ICM Monitoring  Patient Name: Lonnie [name] is a 74 y.o. male Date: 12/15/2019 Primary Care Physican: Redmond School, MD Primary Cardiologist:Hochrein Electrophysiologist: Allred Dry Weight:does not weigh at home  AT/AF Burden 19%  Transmission reviewed.  Corvue thoracic impedancenormal.  Prescribed:Furosemide 40 mg 1 tablet daily.(Wife distributes his medications)  Labs: 10/16/2020Creatinine 1.57, BUN 22, Potassium4.4, Sodium 144, EGFR 43-50  Recommendations: None  Follow-up plan: ICM clinic phone appointment on 01/18/2020.91 day device clinic remote transmission3/23/2021.  Copy of ICM check sent to Dr.Allred  3 month ICM trend: 12/14/2019    1 Year ICM trend:       Rosalene Billings, RN 12/15/2019 10:46 AM

## 2020-01-18 ENCOUNTER — Other Ambulatory Visit: Payer: Self-pay | Admitting: Cardiology

## 2020-01-18 ENCOUNTER — Ambulatory Visit (INDEPENDENT_AMBULATORY_CARE_PROVIDER_SITE_OTHER): Payer: Medicare Other

## 2020-01-18 DIAGNOSIS — Z95810 Presence of automatic (implantable) cardiac defibrillator: Secondary | ICD-10-CM | POA: Diagnosis not present

## 2020-01-18 DIAGNOSIS — I5022 Chronic systolic (congestive) heart failure: Secondary | ICD-10-CM | POA: Diagnosis not present

## 2020-01-19 ENCOUNTER — Telehealth: Payer: Self-pay

## 2020-01-19 NOTE — Telephone Encounter (Signed)
Left message for patient to remind of missed remote transmission.  

## 2020-01-20 NOTE — Progress Notes (Signed)
EPIC Encounter for ICM Monitoring  Patient Name: JAMIAN ANDUJO is a 74 y.o. male Date: 01/20/2020 Primary Care Physican: Redmond School, MD Primary Cardiologist:Hochrein Electrophysiologist: Allred Dry Weight:does not weigh at home  AT/AF Burden 18%   Transmission reviewed.  Corvue thoracic impedancenormal.  Message sent to device clinic triage to review AT/AF Burden  Prescribed:Furosemide 40 mg 1 tablet daily.(Wife distributes his medications)  Labs: 10/16/2020Creatinine 1.57, BUN 22, Potassium4.4, Sodium 144, EGFR 43-50  Recommendations: None  Follow-up plan: ICM clinic phone appointment on6/14/2021.91 day device clinic remote transmission6/22/2021.  Copy of ICM check sent to Dr.Allred  3 month ICM trend: 01/18/2020    1 Year ICM trend:       Rosalene Billings, RN 01/20/2020 9:08 AM

## 2020-02-22 ENCOUNTER — Ambulatory Visit (INDEPENDENT_AMBULATORY_CARE_PROVIDER_SITE_OTHER): Payer: Medicare Other

## 2020-02-22 DIAGNOSIS — I5022 Chronic systolic (congestive) heart failure: Secondary | ICD-10-CM | POA: Diagnosis not present

## 2020-02-22 DIAGNOSIS — Z95810 Presence of automatic (implantable) cardiac defibrillator: Secondary | ICD-10-CM | POA: Diagnosis not present

## 2020-02-24 NOTE — Progress Notes (Signed)
EPIC Encounter for ICM Monitoring  Patient Name: Lonnie [name] is a 74 y.o. male Date: 02/24/2020 Primary Care Physican: Redmond School, MD Primary Cardiologist:Hochrein Electrophysiologist: Allred Dry Weight:does not weigh at home  AT/AF Burden 18%   Transmission reviewed.  Corvue thoracic impedancesuggesting normal fluid levels.    Prescribed:Furosemide 40 mg 1 tablet daily.(Wife distributes his medications)  Labs: 10/16/2020Creatinine 1.57, BUN 22, Potassium4.4, Sodium 144, EGFR 43-50  Recommendations: None  Follow-up plan: ICM clinic phone appointment on7/19/2021.91 day device clinic remote transmission6/22/2021.  Copy of ICM check sent to Dr.Allred  3 month ICM trend: 02/24/2020    1 Year ICM trend:       Rosalene Billings, RN 02/24/2020 1:05 PM

## 2020-03-01 ENCOUNTER — Ambulatory Visit (INDEPENDENT_AMBULATORY_CARE_PROVIDER_SITE_OTHER): Payer: Medicare Other | Admitting: *Deleted

## 2020-03-01 DIAGNOSIS — I5022 Chronic systolic (congestive) heart failure: Secondary | ICD-10-CM

## 2020-03-01 DIAGNOSIS — I422 Other hypertrophic cardiomyopathy: Secondary | ICD-10-CM | POA: Diagnosis not present

## 2020-03-01 LAB — CUP PACEART REMOTE DEVICE CHECK
Battery Remaining Longevity: 19 mo
Battery Remaining Percentage: 25 %
Battery Voltage: 2.81 V
Brady Statistic AP VP Percent: 89 %
Brady Statistic AP VS Percent: 1 %
Brady Statistic AS VP Percent: 9.8 %
Brady Statistic AS VS Percent: 1 %
Brady Statistic RA Percent Paced: 89 %
Brady Statistic RV Percent Paced: 98 %
Date Time Interrogation Session: 20210622020018
HighPow Impedance: 65 Ohm
HighPow Impedance: 65 Ohm
Implantable Lead Implant Date: 20150326
Implantable Lead Implant Date: 20150326
Implantable Lead Location: 753859
Implantable Lead Location: 753860
Implantable Pulse Generator Implant Date: 20150326
Lead Channel Impedance Value: 350 Ohm
Lead Channel Impedance Value: 440 Ohm
Lead Channel Pacing Threshold Amplitude: 0.75 V
Lead Channel Pacing Threshold Amplitude: 1 V
Lead Channel Pacing Threshold Pulse Width: 0.5 ms
Lead Channel Pacing Threshold Pulse Width: 0.8 ms
Lead Channel Sensing Intrinsic Amplitude: 11.7 mV
Lead Channel Sensing Intrinsic Amplitude: 3.7 mV
Lead Channel Setting Pacing Amplitude: 2 V
Lead Channel Setting Pacing Amplitude: 2.5 V
Lead Channel Setting Pacing Pulse Width: 0.8 ms
Lead Channel Setting Sensing Sensitivity: 0.5 mV
Pulse Gen Serial Number: 7175865

## 2020-03-02 NOTE — Progress Notes (Signed)
Remote ICD transmission.   

## 2020-03-14 ENCOUNTER — Other Ambulatory Visit: Payer: Self-pay | Admitting: Cardiology

## 2020-04-05 ENCOUNTER — Ambulatory Visit (INDEPENDENT_AMBULATORY_CARE_PROVIDER_SITE_OTHER): Payer: Medicare Other

## 2020-04-05 DIAGNOSIS — I5022 Chronic systolic (congestive) heart failure: Secondary | ICD-10-CM

## 2020-04-05 DIAGNOSIS — Z95810 Presence of automatic (implantable) cardiac defibrillator: Secondary | ICD-10-CM

## 2020-04-05 NOTE — Progress Notes (Signed)
EPIC Encounter for ICM Monitoring  Patient Name: Lonnie [name] is a 74 y.o. male Date: 04/05/2020 Primary Care Physican: Lonnie School, MD Primary Cardiologist:Lonnie [name] Electrophysiologist: Lonnie [name] Dry Weight:does not weigh at home   Transmission reviewed.  Corvue thoracic impedancesuggesting normal fluid levels.  Prescribed:Furosemide 40 mg 1 tablet daily.(Wife distributes his medications)  Labs: 10/16/2020Creatinine 1.57, BUN 22, Potassium4.4, Sodium 144, EGFR 43-50  Recommendations: None  Follow-up plan: ICM clinic phone appointment on8/30/2021.91 day device clinic remote transmission9/23/2021.  EP/Cardiology Office Visits: Recalls for 06/25/2020 with Dr. Percival [name] and 08/04/2020 with Dr Lonnie [name].    Copy of ICM check sent to Dr. Rayann [name].   Direct ICM trend: 04/05/2020    Lonnie Billings, RN 04/05/2020 2:23 PM

## 2020-04-21 ENCOUNTER — Other Ambulatory Visit: Payer: Self-pay | Admitting: Cardiology

## 2020-04-28 ENCOUNTER — Other Ambulatory Visit: Payer: Self-pay | Admitting: Cardiology

## 2020-05-09 ENCOUNTER — Ambulatory Visit (INDEPENDENT_AMBULATORY_CARE_PROVIDER_SITE_OTHER): Payer: Medicare Other

## 2020-05-09 DIAGNOSIS — Z95810 Presence of automatic (implantable) cardiac defibrillator: Secondary | ICD-10-CM

## 2020-05-09 DIAGNOSIS — I5022 Chronic systolic (congestive) heart failure: Secondary | ICD-10-CM | POA: Diagnosis not present

## 2020-05-10 ENCOUNTER — Telehealth: Payer: Self-pay

## 2020-05-10 NOTE — Telephone Encounter (Signed)
Remote ICM transmission received.  Attempted call to patient regarding ICM remote transmission and unable to leave message because voice mail box not set up.

## 2020-05-10 NOTE — Progress Notes (Signed)
EPIC Encounter for ICM Monitoring  Patient Name: Lonnie [name] is a 74 y.o. male Date: 05/10/2020 Primary Care Physican: Redmond School, MD Primary Cardiologist:Hochrein Electrophysiologist: Allred Dry Weight:does not weigh at home   Attempted call to patient and unable to reach. Unable to leave message due to voice mail box not set up. Transmission reviewed.   Corvue thoracic impedancetrending slightly below baseline suggestingpossible fluid accumulation starting 05/07/2020.  Prescribed:Furosemide 40 mg 1 tablet daily.(Wife distributes his medications)  Labs: 10/16/2020Creatinine 1.57, BUN 22, Potassium4.4, Sodium 144, EGFR 43-50  Recommendations: Unable to reach.    Follow-up plan: ICM clinic phone appointment on9/04/2020 to recheck fluid levels.91 day device clinic remote transmission9/23/2021.  EP/Cardiology Office Visits: Recalls for 06/25/2020 with Dr. Percival Spanish and 08/04/2020 with Dr Rayann Heman.    Copy of ICM check sent to Dr. Rayann Heman and Dr Percival Spanish as Juluis Rainier.    3 month ICM trend: 05/09/2020    1 Year ICM trend:       Rosalene Billings, RN 05/10/2020 9:50 AM

## 2020-05-18 ENCOUNTER — Ambulatory Visit (INDEPENDENT_AMBULATORY_CARE_PROVIDER_SITE_OTHER): Payer: Medicare Other

## 2020-05-18 ENCOUNTER — Telehealth: Payer: Self-pay

## 2020-05-18 DIAGNOSIS — Z95810 Presence of automatic (implantable) cardiac defibrillator: Secondary | ICD-10-CM

## 2020-05-18 DIAGNOSIS — I5022 Chronic systolic (congestive) heart failure: Secondary | ICD-10-CM

## 2020-05-18 NOTE — Telephone Encounter (Signed)
Remote ICM transmission received.  Attempted call to patient regarding ICM remote transmission and voice mail box not set up. 

## 2020-05-18 NOTE — Progress Notes (Signed)
EPIC Encounter for ICM Monitoring  Patient Name: Lonnie [name] is a 74 y.o. male Date: 05/18/2020 Primary Care Physican: Redmond School, MD Primary Cardiologist:Hochrein Electrophysiologist: Allred Dry Weight:does not weigh at home   Attempted call to patient and unable to reach.   Transmission reviewed.   Corvue thoracic impedancereturned to normal 9/1-9/5 but 9/6 returned to below baseline suggestingpossible fluid accumulation has occurred again.  Prescribed:Furosemide 40 mg 1 tablet daily.(Wife distributes his medications)  Labs: 10/16/2020Creatinine 1.57, BUN 22, Potassium4.4, Sodium 144, EGFR 43-50  Recommendations:Unable to reach.    Follow-up plan: ICM clinic phone appointment on 05/27/2020 to recheck fluid levels.91 day device clinic remote transmission9/23/2021.  EP/Cardiology Office Visits:Recalls for 06/25/2020 with Dr.Hochrein and 08/04/2020 with Dr Rayann Heman.   Copy of ICM check sent to Dr.Allred and Dr Percival Spanish for review and recommendations if needed.    3 month ICM trend: 05/18/2020    1 Year ICM trend:       Rosalene Billings, RN 05/18/2020 9:40 AM

## 2020-06-03 ENCOUNTER — Ambulatory Visit (INDEPENDENT_AMBULATORY_CARE_PROVIDER_SITE_OTHER): Payer: Medicare Other

## 2020-06-03 DIAGNOSIS — I5022 Chronic systolic (congestive) heart failure: Secondary | ICD-10-CM

## 2020-06-03 DIAGNOSIS — Z95810 Presence of automatic (implantable) cardiac defibrillator: Secondary | ICD-10-CM

## 2020-06-03 NOTE — Progress Notes (Signed)
EPIC Encounter for ICM Monitoring  Patient Name: Lonnie [name] is a 74 y.o. male Date: 06/03/2020 Primary Care Physican: Redmond School, MD Primary Cardiologist:Hochrein Electrophysiologist: Allred Dry Weight:does not weigh at home   Transmission reviewed.   Corvue thoracic impedancesuggesting fluid levels returned to normal.  Prescribed:Furosemide 40 mg 1 tablet daily.(Wife distributes his medications)  Labs: 10/16/2020Creatinine 1.57, BUN 22, Potassium4.4, Sodium 144, EGFR 43-50  Recommendations:No changes.  Follow-up plan: ICM clinic phone appointment on 10/26.91 day device clinic remote transmission12/23/2021.  EP/Cardiology Office Visits: 07/06/2020 with Dr.Hochrein.  Recall 08/04/2020 with Dr Rayann Heman.   Copy of ICM check sent to Dr.Allred.  Direct Trend ICM: 05/31/2020      Rosalene Billings, RN 06/03/2020 4:16 PM

## 2020-06-21 ENCOUNTER — Other Ambulatory Visit: Payer: Self-pay | Admitting: Cardiology

## 2020-07-05 ENCOUNTER — Ambulatory Visit (INDEPENDENT_AMBULATORY_CARE_PROVIDER_SITE_OTHER): Payer: Medicare Other

## 2020-07-05 DIAGNOSIS — Z95810 Presence of automatic (implantable) cardiac defibrillator: Secondary | ICD-10-CM | POA: Diagnosis not present

## 2020-07-05 DIAGNOSIS — I5022 Chronic systolic (congestive) heart failure: Secondary | ICD-10-CM | POA: Diagnosis not present

## 2020-07-05 NOTE — Progress Notes (Signed)
EPIC Encounter for ICM Monitoring  Patient Name: CASSON CATENA is a 74 y.o. male Date: 07/05/2020 Primary Care Physican: Redmond School, MD Primary Cardiologist:Hochrein Electrophysiologist: Allred Dry Weight:does not weigh at home  AT/AF Burden: 17%   Transmission reviewed.  Corvue thoracic impedancesuggesting normal fluid levels.  Prescribed:Furosemide 40 mg 1 tablet daily.(Wife distributes his medications)  Labs: 10/16/2020Creatinine 1.57, BUN 22, Potassium4.4, Sodium 144, EGFR 43-50  Recommendations:No changes.  Follow-up plan: ICM clinic phone appointment on11/29/2021.91 day device clinic remote transmission12/23/2021.  EP/Cardiology Office Visits: 07/06/2020 with Dr.Hochrein.  Recall 08/04/2020 with Dr Rayann Heman.   Copy of ICM check sent to Dr.Allred and Dr Percival Spanish for [place] since patient has OV 07/06/2020.  3 month ICM trend: 07/05/2020    1 Year ICM trend:       Rosalene Billings, RN 07/05/2020 4:01 PM

## 2020-07-05 NOTE — Progress Notes (Signed)
Cardiology Office Note   Date:  07/06/2020   ID:  JAICEON COLLISTER, DOB Feb 02, 1946, MRN [id number]  PCP:  Elfredia Nevins, MD  Cardiologist:   No primary care provider on file.   Chief Complaint  Patient presents with  . Coronary Artery Disease      History of Present Illness: Lonnie [name] is a 74 y.o. male who presents for evaluation after CABG and mitral valve replacement. He presented late after myocardial infarction. 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 He ultimately required CABG with bioprosthetic mitral valve replacement. He had a resultant EF of 30-35%.   He suffered cardiac arrest on 2/22 with VT/VF.  Follow up cath demonstrated patent bypass grafts.  He initially went home with a Life Vest.  He subsequently returned for ICD placement on 12/03/13.  In November of that same year his ICD fired while he was at work. Prior to that he had a syncopal episode and suffered a subdural hematoma. He has recovered from this and now is status post Watchman.  His last EF is 25%.   At one point  he was noted to be in NSR.  He had been in what was thought to be chronic atrial fib.  He had his device changed from VVI.    Since I last saw him he has done well.  It sounds like he is pretty sedentary.  He does not have any exercise regimen or routine physical activity.  He walks around a little in his house.  [place] most part.  With his mild level of activity he denies any cardiovascular symptoms. The patient denies any new symptoms such as chest discomfort, neck or arm discomfort. There has been no new shortness of breath, PND or orthopnea. There have been no reported palpitations, presyncope or syncope.   Past Medical History:  Diagnosis Date  . Atrial tachycardia (HCC)   . CAD (coronary artery disease)    LAD 95% proximal stenosis, D1 50-60% stenosis, the circumflex 40% stenosis, RCA subtotal stenosis.  . Cardiomyopathy,  ischemic    EF was 30-35% by echo but 45-50% by cath.  Most recent EF 15%.    . Complication of anesthesia    "Hard time waking me up" after sedation after dental procedure  . Constipation   . H/O cardiac arrest   . High cholesterol   . Hypertension   . Mitral regurgitation    Secondary to papillary muscle rupture  . Persistent atrial fibrillation (HCC)    sp Watchman implant  . Reflux   . Ventricular fibrillation (HCC) 02/16/14   successfully defibrillation by his ICD    Past Surgical History:  Procedure Laterality Date  . CARDIOVERSION N/A 01/25/2014   Procedure: CARDIOVERSION;  Surgeon: Wendall Stade, MD;  Location: [facility] ENDOSCOPY;  Service: Cardiovascular;  Laterality: N/A;  . CORONARY ARTERY BYPASS GRAFT    . ENDOVEIN HARVEST OF GREATER SAPHENOUS VEIN Right 02/05/2013   Procedure: ENDOVEIN HARVEST OF GREATER SAPHENOUS VEIN;  Surgeon: [name], MD;  Location: [facility] OR;  Service: Open Heart Surgery;  Laterality: Right;  . IMPLANTABLE CARDIOVERTER DEFIBRILLATOR IMPLANT  12/03/2013   STJ Fortify ICD implanted for secondary prevention by Dr Johney Frame  . IMPLANTABLE CARDIOVERTER DEFIBRILLATOR IMPLANT N/A 12/03/2013   Procedure: IMPLANTABLE CARDIOVERTER DEFIBRILLATOR IMPLANT;  Surgeon: Gardiner Rhyme, MD;  Location: [facility] CATH LAB;  Service: Cardiovascular;  Laterality: N/A;  . INTRAOPERATIVE TRANSESOPHAGEAL ECHOCARDIOGRAM  N/A 02/05/2013   Procedure: INTRAOPERATIVE TRANSESOPHAGEAL ECHOCARDIOGRAM;  Surgeon: [name], MD;  Location: [facility] OR;  Service: Open Heart Surgery;  Laterality: N/A;  . LEFT ATRIAL APPENDAGE OCCLUSION N/A 01/26/2016   Procedure: LEFT ATRIAL APPENDAGE OCCLUSION;  Surgeon: Hillis Range, MD;  Location: [facility] INVASIVE CV LAB;  Service: Cardiovascular;  Laterality: N/A;  . LEFT HEART CATHETERIZATION WITH CORONARY ANGIOGRAM N/A 02/03/2013   Procedure: LEFT HEART CATHETERIZATION WITH CORONARY ANGIOGRAM;  Surgeon: Iran Ouch, MD;  Location: [facility] CATH LAB;  Service:  Cardiovascular;  Laterality: N/A;  . LEFT HEART CATHETERIZATION WITH CORONARY/GRAFT ANGIOGRAM N/A 11/01/2013   Procedure: LEFT HEART CATHETERIZATION WITH Isabel Caprice;  Surgeon: Peter M Swaziland, MD;  Location: [facility] CATH LAB;  Service: Cardiovascular;  Laterality: N/A;  . MITRAL VALVE REPLACEMENT (MVR)/CORONARY ARTERY BYPASS GRAFTING (CABG) N/A 02/05/2013   Procedure: MITRAL VALVE REPLACEMENT (MVR)/CORONARY ARTERY BYPASS GRAFTING (CABG);  Surgeon: [name], MD;  Location: [facility] OR;  Service: Open Heart Surgery;  Laterality: N/A;  x3 using right greater saphenous vein and left internal mammary.   Marland Kitchen PATENT FORAMEN OVALE CLOSURE N/A 02/05/2013   Procedure: PATENT FORAMEN OVALE CLOSURE;  Surgeon: [name], MD;  Location: [facility] OR;  Service: Open Heart Surgery;  Laterality: N/A;  . stomach ulcer repair    . TEE WITHOUT CARDIOVERSION N/A 02/04/2013   Procedure: TRANSESOPHAGEAL ECHOCARDIOGRAM (TEE);  Surgeon: Vesta Mixer, MD;  Location: [facility] ENDOSCOPY;  Service: Cardiovascular;  Laterality: N/A;  . TEE WITHOUT CARDIOVERSION N/A 12/05/2015   Procedure: TRANSESOPHAGEAL ECHOCARDIOGRAM (TEE);  Surgeon: Lewayne Bunting, MD;  Location: [facility] ENDOSCOPY;  Service: Cardiovascular;  Laterality: N/A;  . TEE WITHOUT CARDIOVERSION N/A 03/15/2016   Procedure: TRANSESOPHAGEAL ECHOCARDIOGRAM (TEE);  Surgeon: Chrystie Nose, MD;  Location: [facility] ENDOSCOPY;  Service: Cardiovascular;  Laterality: N/A;     Current Outpatient Medications  Medication Sig Dispense Refill  . acetaminophen (TYLENOL) 500 MG tablet Take 1,000 mg by mouth daily as needed for mild pain.     Marland Kitchen aspirin 325 MG tablet Take 325 mg by mouth daily.    Marland Kitchen atorvastatin (LIPITOR) 80 MG tablet TAKE 1 TABLET BY MOUTH ONCE DAILY AT  6  PM 90 tablet 1  . bisoprolol (ZEBETA) 10 MG tablet Take 1 tablet by mouth once daily 90 tablet 0  . furosemide (LASIX) 40 MG tablet Take 1 tablet by mouth once daily 90 tablet 1  . hydrALAZINE (APRESOLINE) 50  MG tablet Take 1 tablet by mouth twice daily 180 tablet 1  . isosorbide mononitrate (IMDUR) 60 MG 24 hr tablet Take 1 tablet (60 mg total) by mouth daily. KEEP OV. 90 tablet 0  . nitroGLYCERIN (NITROSTAT) 0.4 MG SL tablet Place 1 tablet (0.4 mg total) under the tongue every 5 (five) minutes as needed for chest pain. 25 tablet 3   No current facility-administered medications for this visit.    Allergies:   Patient has no known allergies.    ROS:  Please see the history of present illness.   Otherwise, review of systems are positive for none.   All other systems are reviewed and negative.    PHYSICAL EXAM: VS:  BP 102/70   Pulse 61   Ht 6' (1.829 m)   Wt 219 lb (99.3 kg)   BMI 29.70 kg/m  , BMI Body mass index is 29.7 kg/m. GENERAL:  Well appearing NECK:  No jugular venous distention, waveform within normal limits, carotid upstroke brisk and symmetric, no bruits, no thyromegaly LUNGS:  Clear to auscultation bilaterally CHEST:  Well healed surgical and ICD scar. HEART:  PMI not displaced or sustained,S1 and S2 within normal limits, no S3, no S4, no clicks, no rubs, no murmurs ABD:  Flat, positive bowel sounds normal in frequency in pitch, no bruits, no rebound, no guarding, no midline pulsatile mass, no hepatomegaly, no splenomegaly EXT:  2 plus pulses throughout, no edema, no cyanosis no clubbing   EKG:  EKG is ordered today. The ekg ordered today demonstrates ventricular paced rhythm 100% capture   Recent Labs: No results found for requested labs within last 8760 hours.    Lipid Panel    Component Value Date/Time   CHOL 116 06/23/2018 1106   TRIG 80 06/23/2018 1106   HDL 50 06/23/2018 1106   CHOLHDL 2.3 06/23/2018 1106   CHOLHDL 2.6 04/12/2014 1014   VLDL 18 04/12/2014 1014   LDLCALC 50 06/23/2018 1106      Wt Readings from Last 3 Encounters:  07/06/20 219 lb (99.3 kg)  06/26/19 223 lb 6.4 oz (101.3 kg)  06/23/18 225 lb (102.1 kg)      Other studies  Reviewed: Additional studies/ records that were reviewed today include: labs. Review of the above records demonstrates:  Please see elsewhere in the note.     ASSESSMENT AND PLAN:  CARDIOMYOPATHY:    EF was 25 - 30% in 2019.    The patient tolerates the meds as listed.  We are avoiding ACE and ARB or ARNI with his renal insufficiency.  His blood pressure would not allow med titration.  He is euvolemic.  No change in therapy.  MVR:  This was stable in 2019.     I will likely repeat an echocardiogram next year.   CAD:   The patient has no new sypmtoms.  No further cardiovascular testing is indicated.  We will continue with aggressive risk reduction and meds as listed.  ATRIAL FIBRILLATION:   He is status post watchman with his history of subdural hematoma.  He has had no symptomatic paroxysms.  No change in therapy.  He has a Youth worker.  He has a Youth worker.  He has had a previous subdural hematoma.  He remains on high-dose aspirin which I agree with.  He is not having any symptoms with his paroxysms of A. fib.  No change in therapy.  ICD:   He is up-to-date with follow-up and I reviewed this.  CKD:     Creat was 1.56 which is up slightly from 1.44 last year.   No change in therapy.     Current medicines are reviewed at length with the patient today.  The patient does not have concerns regarding medicines.  The following changes have been made:  no change  Labs/ tests ordered today include: None  Orders Placed This Encounter  Procedures  . EKG 12-Lead     Disposition:   FU with me on one year.     Signed, Rollene Rotunda, MD  07/06/2020 2:18 PM    [facility]

## 2020-07-06 ENCOUNTER — Other Ambulatory Visit: Payer: Self-pay

## 2020-07-06 ENCOUNTER — Ambulatory Visit: Payer: Medicare Other | Admitting: Cardiology

## 2020-07-06 ENCOUNTER — Encounter: Payer: Self-pay | Admitting: Cardiology

## 2020-07-06 VITALS — BP 102/70 | HR 61 | Ht 72.0 in | Wt 219.0 lb

## 2020-07-06 DIAGNOSIS — Z9889 Other specified postprocedural states: Secondary | ICD-10-CM | POA: Diagnosis not present

## 2020-07-06 DIAGNOSIS — Z95810 Presence of automatic (implantable) cardiac defibrillator: Secondary | ICD-10-CM | POA: Diagnosis not present

## 2020-07-06 DIAGNOSIS — N182 Chronic kidney disease, stage 2 (mild): Secondary | ICD-10-CM

## 2020-07-06 DIAGNOSIS — I519 Heart disease, unspecified: Secondary | ICD-10-CM | POA: Diagnosis not present

## 2020-07-06 DIAGNOSIS — I2510 Atherosclerotic heart disease of native coronary artery without angina pectoris: Secondary | ICD-10-CM | POA: Diagnosis not present

## 2020-07-06 DIAGNOSIS — I4891 Unspecified atrial fibrillation: Secondary | ICD-10-CM

## 2020-07-06 NOTE — Patient Instructions (Signed)
Medication Instructions:  The current medical regimen is effective;  continue present plan and medications.  *If you need a refill on your cardiac medications before your next appointment, please call your pharmacy*  Follow-Up: At [facility], you and your health needs are our priority.  As part of our continuing mission to provide you with exceptional heart care, we have created designated Provider Care Teams.  These Care Teams include your primary Cardiologist (physician) and Advanced Practice Providers (APPs -  Physician Assistants and Nurse Practitioners) who all work together to provide you with the care you need, when you need it.  We recommend signing up for the patient portal called "MyChart".  Sign up information is provided on this After Visit Summary.  MyChart is used to connect with patients for Virtual Visits (Telemedicine).  Patients are able to view lab/test results, encounter notes, upcoming appointments, etc.  Non-urgent messages can be sent to your provider as well.   To learn more about what you can do with MyChart, go to [email or web address].    Your next appointment:   12 month(s)  The format for your next appointment:   In Person  Provider:   James Hochrein, MD   Thank you for choosing [facility]!!     

## 2020-07-19 ENCOUNTER — Other Ambulatory Visit: Payer: Self-pay | Admitting: Cardiology

## 2020-08-08 ENCOUNTER — Ambulatory Visit (INDEPENDENT_AMBULATORY_CARE_PROVIDER_SITE_OTHER): Payer: Medicare Other

## 2020-08-08 DIAGNOSIS — I519 Heart disease, unspecified: Secondary | ICD-10-CM

## 2020-08-08 DIAGNOSIS — Z95810 Presence of automatic (implantable) cardiac defibrillator: Secondary | ICD-10-CM | POA: Diagnosis not present

## 2020-08-09 NOTE — Progress Notes (Signed)
EPIC Encounter for ICM Monitoring  Patient Name: Lonnie [name] is a 74 y.o. male Date: 08/09/2020 Primary Care Physican: Redmond School, MD Primary Cardiologist:Hochrein Electrophysiologist: Allred 07/06/2020 Office Weight:219 lbs  AT/AF Burden: 17%   Transmission reviewed.  Corvue thoracic impedancesuggesting normal fluid levels.  Prescribed:Furosemide 40 mg 1 tablet daily.(Wife distributes his medications)  Labs: 10/16/2020Creatinine 1.57, BUN 22, Potassium4.4, Sodium 144, EGFR 43-50  Recommendations:No changes.  Follow-up plan: ICM clinic phone appointment 09/13/2020.91 day device clinic remote transmission12/23/2021.  EP/Cardiology Office Visits:Recall 08/04/2020 with Dr Rayann Heman.   Copy of ICM check sent to Dr.Allred    3 month ICM trend: 08/09/2020    1 Year ICM trend:       Rosalene Billings, RN 08/09/2020 4:08 PM

## 2020-09-01 ENCOUNTER — Ambulatory Visit (INDEPENDENT_AMBULATORY_CARE_PROVIDER_SITE_OTHER): Payer: Medicare Other

## 2020-09-01 DIAGNOSIS — I422 Other hypertrophic cardiomyopathy: Secondary | ICD-10-CM

## 2020-09-05 LAB — CUP PACEART REMOTE DEVICE CHECK
Battery Remaining Longevity: 14 mo
Battery Remaining Percentage: 18 %
Battery Voltage: 2.75 V
Brady Statistic AP VP Percent: 89 %
Brady Statistic AP VS Percent: 1 %
Brady Statistic AS VP Percent: 9.8 %
Brady Statistic AS VS Percent: 1 %
Brady Statistic RA Percent Paced: 89 %
Brady Statistic RV Percent Paced: 98 %
Date Time Interrogation Session: 20211226040622
HighPow Impedance: 65 Ohm
HighPow Impedance: 65 Ohm
Implantable Lead Implant Date: 20150326
Implantable Lead Implant Date: 20150326
Implantable Lead Location: 753859
Implantable Lead Location: 753860
Implantable Pulse Generator Implant Date: 20150326
Lead Channel Impedance Value: 350 Ohm
Lead Channel Impedance Value: 360 Ohm
Lead Channel Pacing Threshold Amplitude: 0.75 V
Lead Channel Pacing Threshold Amplitude: 1 V
Lead Channel Pacing Threshold Pulse Width: 0.5 ms
Lead Channel Pacing Threshold Pulse Width: 0.8 ms
Lead Channel Sensing Intrinsic Amplitude: 3 mV
Lead Channel Sensing Intrinsic Amplitude: 8.1 mV
Lead Channel Setting Pacing Amplitude: 2 V
Lead Channel Setting Pacing Amplitude: 2.5 V
Lead Channel Setting Pacing Pulse Width: 0.8 ms
Lead Channel Setting Sensing Sensitivity: 0.5 mV
Pulse Gen Serial Number: 7175865

## 2020-09-13 ENCOUNTER — Ambulatory Visit (INDEPENDENT_AMBULATORY_CARE_PROVIDER_SITE_OTHER): Payer: Medicare Other

## 2020-09-13 DIAGNOSIS — I5022 Chronic systolic (congestive) heart failure: Secondary | ICD-10-CM | POA: Diagnosis not present

## 2020-09-13 DIAGNOSIS — Z95810 Presence of automatic (implantable) cardiac defibrillator: Secondary | ICD-10-CM | POA: Diagnosis not present

## 2020-09-15 NOTE — Progress Notes (Signed)
Remote ICD transmission.   

## 2020-09-16 NOTE — Progress Notes (Signed)
EPIC Encounter for ICM Monitoring  Patient Name: Lonnie [name] is a 75 y.o. male Date: 09/16/2020 Primary Care Physican: Redmond School, MD Primary Cardiologist:Hochrein Electrophysiologist: Allred 07/06/2020 Office Weight:219 lbs  AT/AF Burden: 17%   Transmission reviewed.  Corvue thoracic impedancenormalfluid levels but was suggesting possible fluid accumulation 08/29/20-09/10/2020.  Prescribed:Furosemide 40 mg 1 tablet daily.(Wife distributes his medications)  Labs: 10/16/2020Creatinine 1.57, BUN 22, Potassium4.4, Sodium 144, EGFR 43-50  Recommendations:No changes.  Follow-up plan: ICM clinic phone appointment 10/18/2020.91 day device clinic remote transmission3/24/2022.  EP/Cardiology Office Visits:11/11/2020 with Dr Rayann Heman.   Copy of ICM check sent to Dr.Allred   3 month ICM trend: 09/13/2020.    1 Year ICM trend:       Rosalene Billings, RN 09/16/2020 1:19 PM

## 2020-10-17 ENCOUNTER — Other Ambulatory Visit: Payer: Self-pay | Admitting: Cardiology

## 2020-10-18 ENCOUNTER — Ambulatory Visit (INDEPENDENT_AMBULATORY_CARE_PROVIDER_SITE_OTHER): Payer: Medicare Other

## 2020-10-18 DIAGNOSIS — I5022 Chronic systolic (congestive) heart failure: Secondary | ICD-10-CM | POA: Diagnosis not present

## 2020-10-18 DIAGNOSIS — Z95810 Presence of automatic (implantable) cardiac defibrillator: Secondary | ICD-10-CM | POA: Diagnosis not present

## 2020-10-19 ENCOUNTER — Telehealth: Payer: Self-pay

## 2020-10-19 NOTE — Telephone Encounter (Signed)
Remote ICM transmission received.  Attempted call to patient regarding ICM remote transmission and no answer or voice mail option.  

## 2020-10-19 NOTE — Progress Notes (Signed)
EPIC Encounter for ICM Monitoring  Patient Name: Lonnie [name] is a 74 y.o. male Date: 10/19/2020 Primary Care Physican: Fusco, Lawrence, MD Primary Cardiologist:Hochrein Electrophysiologist: Allred 07/06/2020 OfficeWeight:219 lbs  AT/AF Burden: 16%(he has a watchman; hx of subdural hematoma)   Attempted call to patient and unable to reach.   Transmission reviewed.   Corvue thoracic impedancesuggesting possible fluid accumulation starting 10/14/2020.  Prescribed:Furosemide 40 mg 1 tablet daily.(Wife distributes his medications)  Labs: 10/16/2020Creatinine 1.57, BUN 22, Potassium4.4, Sodium 144, EGFR 43-50  Recommendations:Unable to reach.    Follow-up plan: ICM clinic phone appointment2/17/2022 to recheck fluid levels.91 day device clinic remote transmission3/24/2022.  EP/Cardiology Office Visits:11/11/2020 with Dr Allred.   Copy of ICM check sent to Dr.Allredand Dr Hoschrein.  3 month ICM trend: 10/18/2020.    1 Year ICM trend:        S , RN 10/19/2020 10:08 AM   

## 2020-10-27 ENCOUNTER — Ambulatory Visit (INDEPENDENT_AMBULATORY_CARE_PROVIDER_SITE_OTHER): Payer: Medicare Other

## 2020-10-27 DIAGNOSIS — Z95810 Presence of automatic (implantable) cardiac defibrillator: Secondary | ICD-10-CM

## 2020-10-27 DIAGNOSIS — I5022 Chronic systolic (congestive) heart failure: Secondary | ICD-10-CM

## 2020-11-02 NOTE — Progress Notes (Signed)
EPIC Encounter for ICM Monitoring  Patient Name: Lonnie [name] is a 74 y.o. male Date: 11/02/2020 Primary Care Physican: Fusco, Lawrence, MD Primary Cardiologist:Hochrein Electrophysiologist: Allred 07/06/2020 OfficeWeight:219 lbs  AT/AF Burden: 16%(he has a watchman; hx of subdural hematoma)   Transmission reviewed.   Corvue thoracic impedancesuggesting fluid levels returned to normal.  Prescribed:Furosemide 40 mg 1 tablet daily.(Wife distributes his medications)  Labs: 10/16/2020Creatinine 1.57, BUN 22, Potassium4.4, Sodium 144, EGFR 43-50  Recommendations:No changes    Follow-up plan: ICM clinic phone appointment3/28/2022.91 day device clinic remote transmission3/24/2022.  EP/Cardiology Office Visits:3/4/2022with Dr Allred.   Copy of ICM check sent to Dr.Allred   3 month ICM trend: 10/27/2020.    1 Year ICM trend:        S , RN 11/02/2020 4:43 PM   

## 2020-11-11 ENCOUNTER — Encounter: Payer: Self-pay | Admitting: Internal Medicine

## 2020-11-11 ENCOUNTER — Ambulatory Visit: Payer: Medicare Other | Admitting: Internal Medicine

## 2020-11-11 VITALS — BP 104/64 | HR 66 | Ht 73.0 in | Wt 225.4 lb

## 2020-11-11 DIAGNOSIS — E7800 Pure hypercholesterolemia, unspecified: Secondary | ICD-10-CM

## 2020-11-11 DIAGNOSIS — I2510 Atherosclerotic heart disease of native coronary artery without angina pectoris: Secondary | ICD-10-CM

## 2020-11-11 DIAGNOSIS — Z9889 Other specified postprocedural states: Secondary | ICD-10-CM | POA: Diagnosis not present

## 2020-11-11 DIAGNOSIS — I480 Paroxysmal atrial fibrillation: Secondary | ICD-10-CM | POA: Diagnosis not present

## 2020-11-11 DIAGNOSIS — I5022 Chronic systolic (congestive) heart failure: Secondary | ICD-10-CM | POA: Diagnosis not present

## 2020-11-11 DIAGNOSIS — I10 Essential (primary) hypertension: Secondary | ICD-10-CM

## 2020-11-11 MED ORDER — HYDRALAZINE HCL 50 MG PO TABS: 50.0000 mg | ORAL_TABLET | Freq: Two times a day (BID) | ORAL | 3 refills | Status: DC

## 2020-11-11 MED ORDER — BISOPROLOL FUMARATE 10 MG PO TABS: 10.0000 mg | ORAL_TABLET | Freq: Every day | ORAL | 3 refills | Status: DC

## 2020-11-11 MED ORDER — ATORVASTATIN CALCIUM 80 MG PO TABS: 80.0000 mg | ORAL_TABLET | Freq: Every day | ORAL | 3 refills | Status: DC

## 2020-11-11 MED ORDER — FUROSEMIDE 40 MG PO TABS: 40.0000 mg | ORAL_TABLET | Freq: Every day | ORAL | 3 refills | Status: DC

## 2020-11-11 MED ORDER — ISOSORBIDE MONONITRATE ER 60 MG PO TB24: 60.0000 mg | ORAL_TABLET | Freq: Every day | ORAL | 3 refills | Status: DC

## 2020-11-11 NOTE — Patient Instructions (Addendum)
Medication Instructions:  Continue all current medications.  Labwork:  FLP, CMET, CBC - orders given today.   Office will contact with results via phone or letter.    Testing/Procedures: none  Follow-Up: 1 year   Any Other Special Instructions Will Be Listed Below (If Applicable).  If you need a refill on your cardiac medications before your next appointment, please call your pharmacy.

## 2020-11-11 NOTE — Progress Notes (Signed)
PCP: Elfredia Nevins, MD Primary Cardiologist: Dr Antoine Poche Primary EP: Dr Abe People is a 75 y.o. male who presents today for routine electrophysiology followup.  Since last being seen in our clinic, the patient reports doing very well.  Today, he denies symptoms of palpitations, chest pain, shortness of breath,  lower extremity edema, dizziness, presyncope, syncope, or ICD shocks.  The patient is otherwise without complaint today.   Past Medical History:  Diagnosis Date  . Atrial tachycardia (HCC)   . CAD (coronary artery disease)    LAD 95% proximal stenosis, D1 50-60% stenosis, the circumflex 40% stenosis, RCA subtotal stenosis.  . Cardiomyopathy, ischemic    EF was 30-35% by echo but 45-50% by cath.  Most recent EF 15%.    . Complication of anesthesia    "Hard time waking me up" after sedation after dental procedure  . Constipation   . H/O cardiac arrest   . High cholesterol   . Hypertension   . Mitral regurgitation    Secondary to papillary muscle rupture  . Persistent atrial fibrillation (HCC)    sp Watchman implant  . Reflux   . Ventricular fibrillation (HCC) 02/16/14   successfully defibrillation by his ICD   Past Surgical History:  Procedure Laterality Date  . CARDIOVERSION N/A 01/25/2014   Procedure: CARDIOVERSION;  Surgeon: Wendall Stade, MD;  Location: [facility] ENDOSCOPY;  Service: Cardiovascular;  Laterality: N/A;  . CORONARY ARTERY BYPASS GRAFT    . ENDOVEIN HARVEST OF GREATER SAPHENOUS VEIN Right 02/05/2013   Procedure: ENDOVEIN HARVEST OF GREATER SAPHENOUS VEIN;  Surgeon: [name], MD;  Location: [facility] OR;  Service: Open Heart Surgery;  Laterality: Right;  . IMPLANTABLE CARDIOVERTER DEFIBRILLATOR IMPLANT  12/03/2013   STJ Fortify ICD implanted for secondary prevention by Dr Johney Frame  . IMPLANTABLE CARDIOVERTER DEFIBRILLATOR IMPLANT N/A 12/03/2013   Procedure: IMPLANTABLE CARDIOVERTER DEFIBRILLATOR IMPLANT;  Surgeon: Gardiner Rhyme, MD;  Location: [facility]  CATH LAB;  Service: Cardiovascular;  Laterality: N/A;  . INTRAOPERATIVE TRANSESOPHAGEAL ECHOCARDIOGRAM N/A 02/05/2013   Procedure: INTRAOPERATIVE TRANSESOPHAGEAL ECHOCARDIOGRAM;  Surgeon: [name], MD;  Location: [facility] OR;  Service: Open Heart Surgery;  Laterality: N/A;  . LEFT ATRIAL APPENDAGE OCCLUSION N/A 01/26/2016   Procedure: LEFT ATRIAL APPENDAGE OCCLUSION;  Surgeon: Hillis Range, MD;  Location: [facility] INVASIVE CV LAB;  Service: Cardiovascular;  Laterality: N/A;  . LEFT HEART CATHETERIZATION WITH CORONARY ANGIOGRAM N/A 02/03/2013   Procedure: LEFT HEART CATHETERIZATION WITH CORONARY ANGIOGRAM;  Surgeon: Iran Ouch, MD;  Location: [facility] CATH LAB;  Service: Cardiovascular;  Laterality: N/A;  . LEFT HEART CATHETERIZATION WITH CORONARY/GRAFT ANGIOGRAM N/A 11/01/2013   Procedure: LEFT HEART CATHETERIZATION WITH Isabel Caprice;  Surgeon: Peter M Swaziland, MD;  Location: [facility] CATH LAB;  Service: Cardiovascular;  Laterality: N/A;  . MITRAL VALVE REPLACEMENT (MVR)/CORONARY ARTERY BYPASS GRAFTING (CABG) N/A 02/05/2013   Procedure: MITRAL VALVE REPLACEMENT (MVR)/CORONARY ARTERY BYPASS GRAFTING (CABG);  Surgeon: [name], MD;  Location: [facility] OR;  Service: Open Heart Surgery;  Laterality: N/A;  x3 using right greater saphenous vein and left internal mammary.   Marland Kitchen PATENT FORAMEN OVALE CLOSURE N/A 02/05/2013   Procedure: PATENT FORAMEN OVALE CLOSURE;  Surgeon: [name], MD;  Location: [facility] OR;  Service: Open Heart Surgery;  Laterality: N/A;  . stomach ulcer repair    . TEE WITHOUT CARDIOVERSION N/A 02/04/2013   Procedure: TRANSESOPHAGEAL ECHOCARDIOGRAM (TEE);  Surgeon: Vesta Mixer, MD;  Location: [facility] ENDOSCOPY;  Service: Cardiovascular;  Laterality: N/A;  .  TEE WITHOUT CARDIOVERSION N/A 12/05/2015   Procedure: TRANSESOPHAGEAL ECHOCARDIOGRAM (TEE);  Surgeon: Lewayne Bunting, MD;  Location: [facility] ENDOSCOPY;  Service: Cardiovascular;  Laterality: N/A;  . TEE WITHOUT CARDIOVERSION N/A  03/15/2016   Procedure: TRANSESOPHAGEAL ECHOCARDIOGRAM (TEE);  Surgeon: Chrystie Nose, MD;  Location: [facility] ENDOSCOPY;  Service: Cardiovascular;  Laterality: N/A;    ROS- all systems are reviewed and negative except as per HPI above  Current Outpatient Medications  Medication Sig Dispense Refill  . acetaminophen (TYLENOL) 500 MG tablet Take 1,000 mg by mouth daily as needed for mild pain.     Marland Kitchen aspirin 325 MG tablet Take 325 mg by mouth daily.    Marland Kitchen atorvastatin (LIPITOR) 80 MG tablet TAKE 1 TABLET BY MOUTH ONCE DAILY AT 6 PM - PLEASE SCHEDULE AN APPOINTMENT FOR FUTURE REFILLS 30 tablet 1  . bisoprolol (ZEBETA) 10 MG tablet Take 1 tablet by mouth once daily 30 tablet 1  . furosemide (LASIX) 40 MG tablet TAKE 1 TABLET BY MOUTH ONCE DAILY - PLEASE SCHEDULE AN APPOINTMENT FOR FUTURE REFILLS 30 tablet 1  . hydrALAZINE (APRESOLINE) 50 MG tablet TAKE 1 TABLET BY MOUTH TWICE DAILY - PLEASE SCHEDULE AN APPOINTMENT FOR FUTURE REFILLS 60 tablet 1  . isosorbide mononitrate (IMDUR) 60 MG 24 hr tablet TAKE 1 TABLET BY MOUTH ONCE DAILY KEEP  OFFICE  VISIT 90 tablet 0  . nitroGLYCERIN (NITROSTAT) 0.4 MG SL tablet Place 1 tablet (0.4 mg total) under the tongue every 5 (five) minutes as needed for chest pain. 25 tablet 3   No current facility-administered medications for this visit.    Physical Exam: Vitals:   11/11/20 0851  BP: 104/64  Pulse: 66  SpO2: 98%  Weight: 225 lb 6.4 oz (102.2 kg)  Height: 6\' 1"  (1.854 m)    GEN- The patient is well appearing, alert and oriented x 3 today.   Head- normocephalic, atraumatic Eyes-  Sclera clear, conjunctiva pink Ears- hearing intact Oropharynx- clear Lungs-   normal work of breathing Chest- ICD pocket is well healed Heart- Regular rate and rhythm  GI- soft  Extremities- no clubbing, cyanosis, or edema  ICD interrogation- reviewed in detail today,  See PACEART report    Wt Readings from Last 3 Encounters:  11/11/20 225 lb 6.4 oz (102.2 kg)  07/06/20  219 lb (99.3 kg)  06/26/19 223 lb 6.4 oz (101.3 kg)    Assessment and Plan:  1.  Chronic systolic dysfunction/ ischemic CM/ prior VF arrest euvolemic today No recent ventricular arrhythmias.  ICD implanted for secondary prevention EF 25% Stable on an appropriate medical regimen Normal ICD function See Pace Art report No changes today He V paces 100%.  PR interval is 400 msec intrinsic.  He has a class I indication for upgrade to CRT.  We discussed at length today.  He states "I cant imagine that I could feel any better than I do right now".  He would like to defer upgrade but will likely be willing to proceed in a year once her reaches ERI.  If he changes his mind or has decompensation in the interim then we will reconsider he is not device dependant today followed in ICM device clinic  2. afib Well controlled  S/p Watchman, with prior history of SDH Takes full dose ASA Check cbc today  3. HTN Stable No change required today bmet today  4. S/p MVR Follows with Dr 06/28/19  5. HL Continue statin Check LFTs, fasting lipids today  Risks, benefits  and potential toxicities for medications prescribed and/or refilled reviewed with patient today.   Return to see me in a year  Hillis Range MD, [facility] 11/11/2020 9:26 AM

## 2020-11-12 LAB — CBC
HCT: 38.5 % (ref 38.5–50.0)
Hemoglobin: 12.7 g/dL — ABNORMAL LOW (ref 13.2–17.1)
MCH: 29.7 pg (ref 27.0–33.0)
MCHC: 33 g/dL (ref 32.0–36.0)
MCV: 90.2 fL (ref 80.0–100.0)
MPV: 11.1 fL (ref 7.5–12.5)
Platelets: 233 10*3/uL (ref 140–400)
RBC: 4.27 10*6/uL (ref 4.20–5.80)
RDW: 13.8 % (ref 11.0–15.0)
WBC: 8.9 10*3/uL (ref 3.8–10.8)

## 2020-11-12 LAB — LIPID PANEL
Cholesterol: 120 mg/dL (ref <200)
HDL: 43 mg/dL (ref >=40)
LDL Cholesterol (Calc): 60 mg/dL (calc)
Non-HDL Cholesterol (Calc): 77 mg/dL (calc) (ref <130)
Total CHOL/HDL Ratio: 2.8 (calc) (ref <5.0)
Triglycerides: 90 mg/dL (ref <150)

## 2020-11-23 LAB — CUP PACEART INCLINIC DEVICE CHECK
Date Time Interrogation Session: 20220304090000
Implantable Lead Implant Date: 20150326
Implantable Lead Implant Date: 20150326
Implantable Lead Location: 753859
Implantable Lead Location: 753860
Implantable Pulse Generator Implant Date: 20150326
Lead Channel Pacing Threshold Amplitude: 0.75 V
Lead Channel Pacing Threshold Amplitude: 1.25 V
Lead Channel Pacing Threshold Pulse Width: 0.5 ms
Lead Channel Pacing Threshold Pulse Width: 0.8 ms
Lead Channel Sensing Intrinsic Amplitude: 11.7 mV
Lead Channel Sensing Intrinsic Amplitude: 3.3 mV
Pulse Gen Serial Number: 7175865

## 2020-12-01 ENCOUNTER — Ambulatory Visit (INDEPENDENT_AMBULATORY_CARE_PROVIDER_SITE_OTHER): Payer: Medicare Other

## 2020-12-01 DIAGNOSIS — I255 Ischemic cardiomyopathy: Secondary | ICD-10-CM | POA: Diagnosis not present

## 2020-12-02 LAB — CUP PACEART REMOTE DEVICE CHECK
Battery Remaining Longevity: 13 mo
Battery Remaining Percentage: 16 %
Battery Voltage: 2.74 V
Brady Statistic AP VP Percent: 80 %
Brady Statistic AP VS Percent: 1 %
Brady Statistic AS VP Percent: 18 %
Brady Statistic AS VS Percent: 1 %
Brady Statistic RA Percent Paced: 80 %
Brady Statistic RV Percent Paced: 98 %
Date Time Interrogation Session: 20220324020016
HighPow Impedance: 64 Ohm
HighPow Impedance: 64 Ohm
Implantable Lead Implant Date: 20150326
Implantable Lead Implant Date: 20150326
Implantable Lead Location: 753859
Implantable Lead Location: 753860
Implantable Pulse Generator Implant Date: 20150326
Lead Channel Impedance Value: 350 Ohm
Lead Channel Impedance Value: 350 Ohm
Lead Channel Pacing Threshold Amplitude: 0.75 V
Lead Channel Pacing Threshold Amplitude: 1.25 V
Lead Channel Pacing Threshold Pulse Width: 0.5 ms
Lead Channel Pacing Threshold Pulse Width: 0.8 ms
Lead Channel Sensing Intrinsic Amplitude: 11.7 mV
Lead Channel Sensing Intrinsic Amplitude: 3 mV
Lead Channel Setting Pacing Amplitude: 2 V
Lead Channel Setting Pacing Amplitude: 2.5 V
Lead Channel Setting Pacing Pulse Width: 0.8 ms
Lead Channel Setting Sensing Sensitivity: 0.5 mV
Pulse Gen Serial Number: 7175865

## 2020-12-05 ENCOUNTER — Ambulatory Visit (INDEPENDENT_AMBULATORY_CARE_PROVIDER_SITE_OTHER): Payer: Medicare Other

## 2020-12-05 DIAGNOSIS — Z95810 Presence of automatic (implantable) cardiac defibrillator: Secondary | ICD-10-CM

## 2020-12-05 DIAGNOSIS — I5022 Chronic systolic (congestive) heart failure: Secondary | ICD-10-CM | POA: Diagnosis not present

## 2020-12-07 NOTE — Progress Notes (Signed)
EPIC Encounter for ICM Monitoring  Patient Name: Lonnie [name] is a 75 y.o. male Date: 12/07/2020 Primary Care Physican: Redmond School, MD Primary Cardiologist:Hochrein Electrophysiologist: Allred 07/06/2020 OfficeWeight:219 lbs  AT/AF Burden: <1%(he has a watchman; hx ofsubdural hematoma)   Transmission reviewed.  Corvue thoracic impedancesuggesting fluid levels returned to normal 12/04/2020  Prescribed:Furosemide 40 mg 1 tablet daily.(Wife distributes his medications)  Labs: 10/16/2020Creatinine 1.57, BUN 22, Potassium4.4, Sodium 144, EGFR 43-50  Recommendations:No changes  Follow-up plan: ICM clinic phone appointment5/10/2020.91 day device clinic remote transmission6/23/2022.  EP/Cardiology Office Visits:  Recall 07/01/2021 with Dr Percival Spanish.   Copy of ICM check sent to Dr.Allred   3 month ICM trend: 12/05/2020.     1 Year ICM trend:       Rosalene Billings, RN 12/07/2020 2:23 PM

## 2020-12-13 NOTE — Progress Notes (Signed)
Remote ICD transmission.   

## 2021-01-05 ENCOUNTER — Ambulatory Visit (INDEPENDENT_AMBULATORY_CARE_PROVIDER_SITE_OTHER): Payer: Medicare Other

## 2021-01-05 DIAGNOSIS — I422 Other hypertrophic cardiomyopathy: Secondary | ICD-10-CM

## 2021-01-05 LAB — CUP PACEART REMOTE DEVICE CHECK
Battery Remaining Longevity: 10 mo
Battery Remaining Percentage: 13 %
Battery Voltage: 2.71 V
Brady Statistic AP VP Percent: 80 %
Brady Statistic AP VS Percent: 1 %
Brady Statistic AS VP Percent: 17 %
Brady Statistic AS VS Percent: 1.6 %
Brady Statistic RA Percent Paced: 81 %
Brady Statistic RV Percent Paced: 98 %
Date Time Interrogation Session: 20220428020017
HighPow Impedance: 62 Ohm
HighPow Impedance: 62 Ohm
Implantable Lead Implant Date: 20150326
Implantable Lead Implant Date: 20150326
Implantable Lead Location: 753859
Implantable Lead Location: 753860
Implantable Pulse Generator Implant Date: 20150326
Lead Channel Impedance Value: 300 Ohm
Lead Channel Impedance Value: 310 Ohm
Lead Channel Pacing Threshold Amplitude: 0.75 V
Lead Channel Pacing Threshold Amplitude: 1.25 V
Lead Channel Pacing Threshold Pulse Width: 0.5 ms
Lead Channel Pacing Threshold Pulse Width: 0.8 ms
Lead Channel Sensing Intrinsic Amplitude: 1.3 mV
Lead Channel Sensing Intrinsic Amplitude: 8.5 mV
Lead Channel Setting Pacing Amplitude: 2 V
Lead Channel Setting Pacing Amplitude: 2.5 V
Lead Channel Setting Pacing Pulse Width: 0.8 ms
Lead Channel Setting Sensing Sensitivity: 0.5 mV
Pulse Gen Serial Number: 7175865

## 2021-01-09 ENCOUNTER — Ambulatory Visit (INDEPENDENT_AMBULATORY_CARE_PROVIDER_SITE_OTHER): Payer: Medicare Other

## 2021-01-09 DIAGNOSIS — I5022 Chronic systolic (congestive) heart failure: Secondary | ICD-10-CM | POA: Diagnosis not present

## 2021-01-09 DIAGNOSIS — Z95810 Presence of automatic (implantable) cardiac defibrillator: Secondary | ICD-10-CM

## 2021-01-09 NOTE — Progress Notes (Signed)
EPIC Encounter for ICM Monitoring  Patient Name: Lonnie [name] is a 75 y.o. male Date: 01/09/2021 Primary Care Physican: Redmond School, MD Primary Cardiologist:Hochrein Electrophysiologist: Allred 07/06/2020 OfficeWeight:219 lbs  AT/AF Burden: <1%(he has a watchman; hx ofsubdural hematoma)   Transmission reviewed.  Corvue thoracic impedancesuggestingnormal fluid levels.  Prescribed:Furosemide 40 mg 1 tablet daily.(Wife distributes his medications)  Labs: 10/16/2020Creatinine 1.57, BUN 22, Potassium4.4, Sodium 144, EGFR 43-50  Recommendations:No changes  Follow-up plan: ICM clinic phone appointment6/13/2022.91 day device clinic remote transmission6/23/2022.  EP/Cardiology Office Visits:  Recall 07/01/2021 with Dr Percival Spanish.   Copy of ICM check sent to Dr.Allred  3 month ICM trend: 01/09/2021.    1 Year ICM trend:       Rosalene Billings, RN 01/09/2021 5:01 PM

## 2021-01-26 NOTE — Progress Notes (Signed)
Remote ICD transmission.   

## 2021-02-20 ENCOUNTER — Ambulatory Visit (INDEPENDENT_AMBULATORY_CARE_PROVIDER_SITE_OTHER): Payer: Medicare Other

## 2021-02-20 DIAGNOSIS — Z95810 Presence of automatic (implantable) cardiac defibrillator: Secondary | ICD-10-CM

## 2021-02-20 DIAGNOSIS — I5022 Chronic systolic (congestive) heart failure: Secondary | ICD-10-CM | POA: Diagnosis not present

## 2021-02-22 NOTE — Progress Notes (Signed)
EPIC Encounter for ICM Monitoring  Patient Name: Lonnie [name] is a 75 y.o. male Date: 02/22/2021 Primary Care Physican: Redmond School, MD Primary Cardiologist: Hochrein Electrophysiologist: Allred 11/11/2020 Office Weight: 225 lbs        AT/AF Burden: <1% (he has a watchman; hx of subdural hematoma)           Transmission reviewed.    Corvue thoracic impedance suggesting normal fluid levels.   Prescribed: Furosemide 40 mg 1 tablet daily. (Wife distributes his medications)   Labs: 06/26/2019 Creatinine 1.57, BUN 22, Potassium 4.4, Sodium 144, EGFR 43-50   Recommendations: No changes     Follow-up plan: ICM clinic phone appointment 03/27/2021.  91 day device clinic remote transmission 03/02/2021.     EP/Cardiology Office Visits:  Recall 07/01/2021 with Dr Percival Spanish.  Last EP visit 11/11/2020 with Dr Rayann Heman.   Copy of ICM check sent to Dr. Rayann Heman    3 month ICM trend: 02/20/2021.    1 Year ICM trend:       Rosalene Billings, RN 02/22/2021 7:56 AM

## 2021-03-07 ENCOUNTER — Ambulatory Visit (INDEPENDENT_AMBULATORY_CARE_PROVIDER_SITE_OTHER): Payer: Medicare Other

## 2021-03-07 DIAGNOSIS — I255 Ischemic cardiomyopathy: Secondary | ICD-10-CM

## 2021-03-07 LAB — CUP PACEART REMOTE DEVICE CHECK
Battery Remaining Longevity: 9 mo
Battery Remaining Percentage: 11 %
Battery Voltage: 2.69 V
Brady Statistic AP VP Percent: 81 %
Brady Statistic AP VS Percent: 1 %
Brady Statistic AS VP Percent: 17 %
Brady Statistic AS VS Percent: 1.3 %
Brady Statistic RA Percent Paced: 81 %
Brady Statistic RV Percent Paced: 98 %
Date Time Interrogation Session: 20220628041104
HighPow Impedance: 64 Ohm
HighPow Impedance: 64 Ohm
Implantable Lead Implant Date: 20150326
Implantable Lead Implant Date: 20150326
Implantable Lead Location: 753859
Implantable Lead Location: 753860
Implantable Pulse Generator Implant Date: 20150326
Lead Channel Impedance Value: 300 Ohm
Lead Channel Impedance Value: 340 Ohm
Lead Channel Pacing Threshold Amplitude: 0.75 V
Lead Channel Pacing Threshold Amplitude: 1.25 V
Lead Channel Pacing Threshold Pulse Width: 0.5 ms
Lead Channel Pacing Threshold Pulse Width: 0.8 ms
Lead Channel Sensing Intrinsic Amplitude: 11.7 mV
Lead Channel Sensing Intrinsic Amplitude: 3 mV
Lead Channel Setting Pacing Amplitude: 2 V
Lead Channel Setting Pacing Amplitude: 2.5 V
Lead Channel Setting Pacing Pulse Width: 0.8 ms
Lead Channel Setting Sensing Sensitivity: 0.5 mV
Pulse Gen Serial Number: 7175865

## 2021-03-27 ENCOUNTER — Ambulatory Visit (INDEPENDENT_AMBULATORY_CARE_PROVIDER_SITE_OTHER): Payer: Medicare Other

## 2021-03-27 DIAGNOSIS — I5022 Chronic systolic (congestive) heart failure: Secondary | ICD-10-CM

## 2021-03-27 DIAGNOSIS — Z95810 Presence of automatic (implantable) cardiac defibrillator: Secondary | ICD-10-CM | POA: Diagnosis not present

## 2021-03-28 NOTE — Progress Notes (Signed)
Remote ICD transmission.   

## 2021-03-29 ENCOUNTER — Telehealth: Payer: Self-pay

## 2021-03-29 NOTE — Progress Notes (Signed)
EPIC Encounter for ICM Monitoring  Patient Name: Lonnie [name] is a 75 y.o. male Date: 03/29/2021 Primary Care Physican: Redmond School, MD Primary Cardiologist: Hochrein Electrophysiologist: Allred 11/11/2020 Office Weight: 225 lbs        AT/AF Burden: <1% (he has a watchman; hx of subdural hematoma)           Attempted call to patient and unable to reach.   Transmission reviewed.    Corvue thoracic impedance suggesting possible fluid accumulation starting 03/21/2021 and returned close to baseline on transmission day.   Prescribed: Furosemide 40 mg 1 tablet daily. (Wife distributes his medications)   Labs: 06/26/2019 Creatinine 1.57, BUN 22, Potassium 4.4, Sodium 144, EGFR 43-50   Recommendations: Unable to reach.     Follow-up plan: ICM clinic phone appointment 05/01/2021.  91 day device clinic remote transmission 05/22/2021.     EP/Cardiology Office Visits:  Recall 07/01/2021 with Dr Percival Spanish.  Last EP visit 11/11/2020 with Dr Rayann Heman.   Copy of ICM check sent to Dr. Rayann Heman.   3 month ICM trend: 03/27/2021.    1 Year ICM trend:       Rosalene Billings, RN 03/29/2021 11:17 AM

## 2021-03-29 NOTE — Telephone Encounter (Signed)
Remote ICM transmission received.  Attempted call to patient regarding ICM remote transmission and voice mail box is not set up.    

## 2021-04-17 ENCOUNTER — Ambulatory Visit (INDEPENDENT_AMBULATORY_CARE_PROVIDER_SITE_OTHER): Payer: Medicare Other

## 2021-04-17 DIAGNOSIS — I422 Other hypertrophic cardiomyopathy: Secondary | ICD-10-CM

## 2021-04-18 LAB — CUP PACEART REMOTE DEVICE CHECK
Battery Remaining Longevity: 7 mo
Battery Remaining Percentage: 9 %
Battery Voltage: 2.68 V
Brady Statistic AP VP Percent: 81 %
Brady Statistic AP VS Percent: 1 %
Brady Statistic AS VP Percent: 16 %
Brady Statistic AS VS Percent: 1.4 %
Brady Statistic RA Percent Paced: 82 %
Brady Statistic RV Percent Paced: 98 %
Date Time Interrogation Session: 20220808043022
HighPow Impedance: 65 Ohm
HighPow Impedance: 65 Ohm
Implantable Lead Implant Date: 20150326
Implantable Lead Implant Date: 20150326
Implantable Lead Location: 753859
Implantable Lead Location: 753860
Implantable Pulse Generator Implant Date: 20150326
Lead Channel Impedance Value: 290 Ohm
Lead Channel Impedance Value: 340 Ohm
Lead Channel Pacing Threshold Amplitude: 0.75 V
Lead Channel Pacing Threshold Amplitude: 1.25 V
Lead Channel Pacing Threshold Pulse Width: 0.5 ms
Lead Channel Pacing Threshold Pulse Width: 0.8 ms
Lead Channel Sensing Intrinsic Amplitude: 2.8 mV
Lead Channel Sensing Intrinsic Amplitude: 9.7 mV
Lead Channel Setting Pacing Amplitude: 2 V
Lead Channel Setting Pacing Amplitude: 2.5 V
Lead Channel Setting Pacing Pulse Width: 0.8 ms
Lead Channel Setting Sensing Sensitivity: 0.5 mV
Pulse Gen Serial Number: 7175865

## 2021-05-01 ENCOUNTER — Ambulatory Visit (INDEPENDENT_AMBULATORY_CARE_PROVIDER_SITE_OTHER): Payer: Medicare Other

## 2021-05-01 DIAGNOSIS — I5022 Chronic systolic (congestive) heart failure: Secondary | ICD-10-CM | POA: Diagnosis not present

## 2021-05-01 DIAGNOSIS — Z95810 Presence of automatic (implantable) cardiac defibrillator: Secondary | ICD-10-CM

## 2021-05-03 NOTE — Progress Notes (Signed)
EPIC Encounter for ICM Monitoring  Patient Name: Lonnie [name] is a 75 y.o. male Date: 05/03/2021 Primary Care Physican: Redmond School, MD Primary Cardiologist: Hochrein Electrophysiologist: Allred 11/11/2020 Office Weight: 225 lbs     Battery ERI: 6.7 months     AT/AF Burden: <1% (he has a watchman; hx of subdural hematoma)           Attempted call to patient and unable to reach.   Transmission reviewed.    Corvue thoracic impedance trending slightly below baseline.   Prescribed: Furosemide 40 mg 1 tablet daily. (Wife distributes his medications)   Labs: 06/26/2019 Creatinine 1.57, BUN 22, Potassium 4.4, Sodium 144, EGFR 43-50   Recommendations: Unable to reach.     Follow-up plan: ICM clinic phone appointment 06/12/2021.  91 day device clinic remote transmission 05/22/2021.     EP/Cardiology Office Visits:  Recall 07/01/2021 with Dr Percival Spanish.  Last EP visit 11/11/2020 with Dr Rayann Heman.   Copy of ICM check sent to Dr. Rayann Heman.    3 month ICM trend: 05/01/2021.    1 Year ICM trend:       Rosalene Billings, RN 05/03/2021 5:08 PM

## 2021-05-11 NOTE — Progress Notes (Signed)
Remote ICD transmission.   

## 2021-05-11 NOTE — Addendum Note (Signed)
Addended by: Elease Etienne A on: 05/11/2021 09:20 AM   Modules accepted: Level of Service

## 2021-05-22 ENCOUNTER — Ambulatory Visit (INDEPENDENT_AMBULATORY_CARE_PROVIDER_SITE_OTHER): Payer: Medicare Other

## 2021-05-22 DIAGNOSIS — I255 Ischemic cardiomyopathy: Secondary | ICD-10-CM

## 2021-05-22 LAB — CUP PACEART REMOTE DEVICE CHECK
Battery Remaining Longevity: 7 mo
Battery Remaining Percentage: 9 %
Battery Voltage: 2.66 V
Brady Statistic AP VP Percent: 82 %
Brady Statistic AP VS Percent: 1 %
Brady Statistic AS VP Percent: 15 %
Brady Statistic AS VS Percent: 1.2 %
Brady Statistic RA Percent Paced: 83 %
Brady Statistic RV Percent Paced: 98 %
Date Time Interrogation Session: 20220912020016
HighPow Impedance: 68 Ohm
HighPow Impedance: 68 Ohm
Implantable Lead Implant Date: 20150326
Implantable Lead Implant Date: 20150326
Implantable Lead Location: 753859
Implantable Lead Location: 753860
Implantable Pulse Generator Implant Date: 20150326
Lead Channel Impedance Value: 300 Ohm
Lead Channel Impedance Value: 350 Ohm
Lead Channel Pacing Threshold Amplitude: 0.75 V
Lead Channel Pacing Threshold Amplitude: 1.25 V
Lead Channel Pacing Threshold Pulse Width: 0.5 ms
Lead Channel Pacing Threshold Pulse Width: 0.8 ms
Lead Channel Sensing Intrinsic Amplitude: 10.4 mV
Lead Channel Sensing Intrinsic Amplitude: 2.8 mV
Lead Channel Setting Pacing Amplitude: 2 V
Lead Channel Setting Pacing Amplitude: 2.5 V
Lead Channel Setting Pacing Pulse Width: 0.8 ms
Lead Channel Setting Sensing Sensitivity: 0.5 mV
Pulse Gen Serial Number: 7175865

## 2021-05-30 NOTE — Progress Notes (Signed)
Remote ICD transmission.   

## 2021-06-12 ENCOUNTER — Ambulatory Visit (INDEPENDENT_AMBULATORY_CARE_PROVIDER_SITE_OTHER): Payer: Medicare Other

## 2021-06-12 DIAGNOSIS — I5022 Chronic systolic (congestive) heart failure: Secondary | ICD-10-CM | POA: Diagnosis not present

## 2021-06-12 DIAGNOSIS — Z95810 Presence of automatic (implantable) cardiac defibrillator: Secondary | ICD-10-CM

## 2021-06-14 NOTE — Progress Notes (Signed)
EPIC Encounter for ICM Monitoring  Patient Name: Lonnie [name] is a 75 y.o. male Date: 06/14/2021 Primary Care Physican: Redmond School, MD Primary Cardiologist: Hochrein Electrophysiologist: Allred 11/11/2020 Office Weight: 225 lbs      Battery ERI: 5.7 months     AT/AF Burden: <1% (he has a watchman; hx of subdural hematoma)           Transmission reviewed.    Corvue thoracic impedance trending slightly below baseline.   Prescribed: Furosemide 40 mg 1 tablet daily. (Wife distributes his medications)   Labs: 06/26/2019 Creatinine 1.57, BUN 22, Potassium 4.4, Sodium 144, EGFR 43-50   Recommendations:  No changes   Follow-up plan: ICM clinic phone appointment 07/17/2021.  91 day device clinic remote transmission 08/31/2021.     EP/Cardiology Office Visits:  08/09/2021 with Dr Percival Spanish.  Last EP visit 11/11/2020 with Dr Rayann Heman.   Copy of ICM check sent to Dr. Rayann Heman.    3 month ICM trend: 06/12/2021.    1 Year ICM trend:       Rosalene Billings, RN 06/14/2021 3:51 PM

## 2021-06-26 ENCOUNTER — Ambulatory Visit (INDEPENDENT_AMBULATORY_CARE_PROVIDER_SITE_OTHER): Payer: Medicare Other

## 2021-06-26 DIAGNOSIS — I422 Other hypertrophic cardiomyopathy: Secondary | ICD-10-CM

## 2021-06-27 LAB — CUP PACEART REMOTE DEVICE CHECK
Battery Remaining Longevity: 6 mo
Battery Remaining Percentage: 7 %
Battery Voltage: 2.65 V
Brady Statistic AP VP Percent: 82 %
Brady Statistic AP VS Percent: 1 %
Brady Statistic AS VP Percent: 16 %
Brady Statistic AS VS Percent: 1.2 %
Brady Statistic RA Percent Paced: 82 %
Brady Statistic RV Percent Paced: 98 %
Date Time Interrogation Session: 20221017020016
HighPow Impedance: 69 Ohm
HighPow Impedance: 69 Ohm
Implantable Lead Implant Date: 20150326
Implantable Lead Implant Date: 20150326
Implantable Lead Location: 753859
Implantable Lead Location: 753860
Implantable Pulse Generator Implant Date: 20150326
Lead Channel Impedance Value: 290 Ohm
Lead Channel Impedance Value: 340 Ohm
Lead Channel Pacing Threshold Amplitude: 0.75 V
Lead Channel Pacing Threshold Amplitude: 1.25 V
Lead Channel Pacing Threshold Pulse Width: 0.5 ms
Lead Channel Pacing Threshold Pulse Width: 0.8 ms
Lead Channel Sensing Intrinsic Amplitude: 11.7 mV
Lead Channel Sensing Intrinsic Amplitude: 2.9 mV
Lead Channel Setting Pacing Amplitude: 2 V
Lead Channel Setting Pacing Amplitude: 2.5 V
Lead Channel Setting Pacing Pulse Width: 0.8 ms
Lead Channel Setting Sensing Sensitivity: 0.5 mV
Pulse Gen Serial Number: 7175865

## 2021-07-05 NOTE — Progress Notes (Signed)
Remote ICD transmission.   

## 2021-07-05 NOTE — Addendum Note (Signed)
Addended by: Elease Etienne A on: 07/05/2021 10:51 AM   Modules accepted: Level of Service

## 2021-07-17 ENCOUNTER — Ambulatory Visit (INDEPENDENT_AMBULATORY_CARE_PROVIDER_SITE_OTHER): Payer: Medicare Other

## 2021-07-17 DIAGNOSIS — I5022 Chronic systolic (congestive) heart failure: Secondary | ICD-10-CM | POA: Diagnosis not present

## 2021-07-17 DIAGNOSIS — Z95810 Presence of automatic (implantable) cardiac defibrillator: Secondary | ICD-10-CM | POA: Diagnosis not present

## 2021-07-21 NOTE — Progress Notes (Signed)
EPIC Encounter for ICM Monitoring  Patient Name: Lonnie [name] is a 75 y.o. male Date: 07/21/2021 Primary Care Physican: Redmond School, MD Primary Cardiologist: Hochrein Electrophysiologist: Allred 11/11/2020 Office Weight: 225 lbs      Battery ERI: 5.7 months     AT/AF Burden: <1% (he has a watchman; hx of subdural hematoma)           Transmission reviewed.    Corvue thoracic impedance suggesting normal fluid levels.   Prescribed: Furosemide 40 mg 1 tablet daily. (Wife distributes his medications)   Labs: 06/26/2019 Creatinine 1.57, BUN 22, Potassium 4.4, Sodium 144, EGFR 43-50   Recommendations:  No changes   Follow-up plan: ICM clinic phone appointment 08/21/2021.  91 day device clinic remote transmission 08/31/2021.     EP/Cardiology Office Visits:  08/09/2021 with Dr Percival Spanish.  11/10/2021 with Dr Rayann Heman.   Copy of ICM check sent to Dr. Rayann Heman.    3 month ICM trend: 07/17/2021.    1 Year ICM trend:       Rosalene Billings, RN 07/21/2021 8:48 AM

## 2021-07-31 ENCOUNTER — Ambulatory Visit (INDEPENDENT_AMBULATORY_CARE_PROVIDER_SITE_OTHER): Payer: Medicare Other

## 2021-07-31 DIAGNOSIS — I422 Other hypertrophic cardiomyopathy: Secondary | ICD-10-CM

## 2021-07-31 LAB — CUP PACEART REMOTE DEVICE CHECK
Battery Remaining Longevity: 5 mo
Battery Remaining Percentage: 6 %
Battery Voltage: 2.63 V
Brady Statistic AP VP Percent: 82 %
Brady Statistic AP VS Percent: 1 %
Brady Statistic AS VP Percent: 16 %
Brady Statistic AS VS Percent: 1.3 %
Brady Statistic RA Percent Paced: 82 %
Brady Statistic RV Percent Paced: 98 %
Date Time Interrogation Session: 20221121020022
HighPow Impedance: 65 Ohm
HighPow Impedance: 65 Ohm
Implantable Lead Implant Date: 20150326
Implantable Lead Implant Date: 20150326
Implantable Lead Location: 753859
Implantable Lead Location: 753860
Implantable Pulse Generator Implant Date: 20150326
Lead Channel Impedance Value: 290 Ohm
Lead Channel Impedance Value: 340 Ohm
Lead Channel Pacing Threshold Amplitude: 0.75 V
Lead Channel Pacing Threshold Amplitude: 1.25 V
Lead Channel Pacing Threshold Pulse Width: 0.5 ms
Lead Channel Pacing Threshold Pulse Width: 0.8 ms
Lead Channel Sensing Intrinsic Amplitude: 11.7 mV
Lead Channel Sensing Intrinsic Amplitude: 2.7 mV
Lead Channel Setting Pacing Amplitude: 2 V
Lead Channel Setting Pacing Amplitude: 2.5 V
Lead Channel Setting Pacing Pulse Width: 0.8 ms
Lead Channel Setting Sensing Sensitivity: 0.5 mV
Pulse Gen Serial Number: 7175865

## 2021-08-07 DIAGNOSIS — N1831 Chronic kidney disease, stage 3a: Secondary | ICD-10-CM | POA: Insufficient documentation

## 2021-08-07 DIAGNOSIS — I420 Dilated cardiomyopathy: Secondary | ICD-10-CM | POA: Insufficient documentation

## 2021-08-07 NOTE — Progress Notes (Signed)
Cardiology Office Note   Date:  08/09/2021   ID:  Lonnie [name], DOB 11-16-1945, MRN [id number]  PCP:  Redmond School, MD  Cardiologist:   Minus Breeding, MD   Chief Complaint  Patient presents with   Coronary Artery Disease       History of Present Illness: Lonnie [name] is a 75 y.o. male who presents for evaluation after CABG and mitral valve replacement. He presented late after myocardial infarction. 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 He ultimately required CABG with bioprosthetic mitral valve replacement. He had a resultant EF of 30-35%.   He suffered cardiac arrest on 2/22 with VT/VF.  Follow up cath demonstrated patent bypass grafts.  He initially went home with a Life Vest.  He subsequently returned for ICD placement on 12/03/13.  In November of that same year his ICD fired while he was at work. Prior to that he had a syncopal episode and suffered a subdural hematoma. He has recovered from this and now is status post Watchman.  His last EF is 25%.   At one point  he was noted to be in NSR.  He had been in what was thought to be chronic atrial fib.    Since I last saw him he has done well.  He is very sedentary particularly during the winter he says.  With his mild activities he denies any cardiovascular symptoms. The patient denies any new symptoms such as chest discomfort, neck or arm discomfort. There has been no new shortness of breath, PND or orthopnea. There have been no reported palpitations, presyncope or syncope.    Past Medical History:  Diagnosis Date   Atrial tachycardia (HCC)    CAD (coronary artery disease)    LAD 95% proximal stenosis, D1 50-60% stenosis, the circumflex 40% stenosis, RCA subtotal stenosis.   Cardiomyopathy, ischemic    EF was 30-35% by echo but 45-50% by cath.  Most recent EF 15%.     Complication of anesthesia    "Hard time waking me up" after sedation after dental procedure   Constipation    H/O  cardiac arrest    High cholesterol    Hypertension    Mitral regurgitation    Secondary to papillary muscle rupture   Persistent atrial fibrillation (HCC)    sp Watchman implant   Reflux    Ventricular fibrillation ([facility]) 02/16/14   successfully defibrillation by his ICD    Past Surgical History:  Procedure Laterality Date   CARDIOVERSION N/A 01/25/2014   Procedure: CARDIOVERSION;  Surgeon: Josue Hector, MD;  Location: [facility];  Service: Cardiovascular;  Laterality: N/A;   CORONARY ARTERY BYPASS GRAFT     ENDOVEIN HARVEST OF GREATER SAPHENOUS VEIN Right 02/05/2013   Procedure: ENDOVEIN HARVEST OF [place];  Surgeon: Melrose Nakayama, MD;  Location: [place];  Service: Open Heart Surgery;  Laterality: Right;   IMPLANTABLE CARDIOVERTER DEFIBRILLATOR IMPLANT  12/03/2013   STJ Fortify ICD implanted for secondary prevention by Dr Rayann Heman   IMPLANTABLE CARDIOVERTER DEFIBRILLATOR IMPLANT N/A 12/03/2013   Procedure: IMPLANTABLE CARDIOVERTER DEFIBRILLATOR IMPLANT;  Surgeon: Coralyn Mark, MD;  Location: [facility] CATH LAB;  Service: Cardiovascular;  Laterality: N/A;   INTRAOPERATIVE TRANSESOPHAGEAL ECHOCARDIOGRAM N/A 02/05/2013   Procedure: INTRAOPERATIVE TRANSESOPHAGEAL ECHOCARDIOGRAM;  Surgeon: Melrose Nakayama, MD;  Location: [place];  Service: Open Heart Surgery;  Laterality: N/A;   LEFT ATRIAL APPENDAGE OCCLUSION N/A 01/26/2016   Procedure: LEFT ATRIAL APPENDAGE OCCLUSION;  Surgeon:  Thompson Grayer, MD;  Location: [facility] CV LAB;  Service: Cardiovascular;  Laterality: N/A;   LEFT HEART CATHETERIZATION WITH CORONARY ANGIOGRAM N/A 02/03/2013   Procedure: LEFT HEART CATHETERIZATION WITH CORONARY ANGIOGRAM;  Surgeon: Wellington Hampshire, MD;  Location: [facility] CATH LAB;  Service: Cardiovascular;  Laterality: N/A;   LEFT HEART CATHETERIZATION WITH CORONARY/GRAFT ANGIOGRAM N/A 11/01/2013   Procedure: LEFT HEART CATHETERIZATION WITH Beatrix Fetters;  Surgeon: Peter M Martinique, MD;  Location:  [facility] CATH LAB;  Service: Cardiovascular;  Laterality: N/A;   MITRAL VALVE REPLACEMENT (MVR)/CORONARY ARTERY BYPASS GRAFTING (CABG) N/A 02/05/2013   Procedure: MITRAL VALVE REPLACEMENT (MVR)/CORONARY ARTERY BYPASS GRAFTING (CABG);  Surgeon: Melrose Nakayama, MD;  Location: [place];  Service: Open Heart Surgery;  Laterality: N/A;  x3 using right greater saphenous vein and left internal mammary.    PATENT FORAMEN OVALE CLOSURE N/A 02/05/2013   Procedure: PATENT FORAMEN OVALE CLOSURE;  Surgeon: Melrose Nakayama, MD;  Location: [place];  Service: Open Heart Surgery;  Laterality: N/A;   stomach ulcer repair     TEE WITHOUT CARDIOVERSION N/A 02/04/2013   Procedure: TRANSESOPHAGEAL ECHOCARDIOGRAM (TEE);  Surgeon: Thayer Headings, MD;  Location: [place];  Service: Cardiovascular;  Laterality: N/A;   TEE WITHOUT CARDIOVERSION N/A 12/05/2015   Procedure: TRANSESOPHAGEAL ECHOCARDIOGRAM (TEE);  Surgeon: Lelon Perla, MD;  Location: [facility] ENDOSCOPY;  Service: Cardiovascular;  Laterality: N/A;   TEE WITHOUT CARDIOVERSION N/A 03/15/2016   Procedure: TRANSESOPHAGEAL ECHOCARDIOGRAM (TEE);  Surgeon: Pixie Casino, MD;  Location: [facility] ENDOSCOPY;  Service: Cardiovascular;  Laterality: N/A;     Current Outpatient Medications  Medication Sig Dispense Refill   acetaminophen (TYLENOL) 500 MG tablet Take 1,000 mg by mouth daily as needed for mild pain.      aspirin 325 MG tablet Take 325 mg by mouth daily.     atorvastatin (LIPITOR) 80 MG tablet Take 1 tablet (80 mg total) by mouth daily. 90 tablet 3   bisoprolol (ZEBETA) 10 MG tablet Take 1 tablet (10 mg total) by mouth daily. 90 tablet 3   furosemide (LASIX) 40 MG tablet Take 1 tablet (40 mg total) by mouth daily. 90 tablet 3   hydrALAZINE (APRESOLINE) 50 MG tablet Take 1 tablet (50 mg total) by mouth 2 (two) times daily. 180 tablet 3   isosorbide mononitrate (IMDUR) 60 MG 24 hr tablet Take 1 tablet (60 mg total) by mouth daily. 90 tablet 3   nitroGLYCERIN  (NITROSTAT) 0.4 MG SL tablet Place 1 tablet (0.4 mg total) under the tongue every 5 (five) minutes as needed for chest pain. 25 tablet 3   No current facility-administered medications for this visit.    Allergies:   Patient has no known allergies.    ROS:  Please see the history of present illness.   Otherwise, review of systems are positive for none.   All other systems are reviewed and negative.    PHYSICAL EXAM: VS:  BP 100/70   Pulse 68   Ht 6' 1"  (1.854 m)   Wt 222 lb (100.7 kg)   BMI 29.29 kg/m  , BMI Body mass index is 29.29 kg/m. GENERAL:  Well appearing NECK:  No jugular venous distention, waveform within normal limits, carotid upstroke brisk and symmetric, no bruits, no thyromegaly LUNGS:  Clear to auscultation bilaterally CHEST:  Unremarkable HEART:  PMI not displaced or sustained,S1 and S2 within normal limits, no S3, no S4, no clicks, no rubs, soft apical systolic murmur murmurs ABD:  Flat, positive bowel  sounds normal in frequency in pitch, no bruits, no rebound, no guarding, no midline pulsatile mass, no hepatomegaly, no splenomegaly EXT:  2 plus pulses throughout, no edema, no cyanosis no clubbing   EKG:  EKG is  ordered today. The ekg ordered  demonstrates ventricular paced rhythm 100% capture, rate 68   Recent Labs: 11/11/2020: Hemoglobin 12.7; Platelets 233    Lipid Panel    Component Value Date/Time   CHOL 120 11/11/2020 1014   CHOL 116 06/23/2018 1106   TRIG 90 11/11/2020 1014   HDL 43 11/11/2020 1014   HDL 50 06/23/2018 1106   CHOLHDL 2.8 11/11/2020 1014   VLDL 18 04/12/2014 1014   LDLCALC 60 11/11/2020 1014      Wt Readings from Last 3 Encounters:  08/09/21 222 lb (100.7 kg)  11/11/20 225 lb 6.4 oz (102.2 kg)  07/06/20 219 lb (99.3 kg)      Other studies Reviewed: Additional studies/ records that were reviewed today include: None. Review of the above records demonstrates:  NA   ASSESSMENT AND PLAN:  CARDIOMYOPATHY:    EF was 25 -  30% in 2019.     He seems to be euvolemic.  We are avoiding ACE inhibitors ARB's or ARNis secondary to CKD.  His blood pressure would not allow med titration.  No change in therapy.   MVR:  This was stable in 2019.   No change in therapy.   CAD:   The patient has no new sypmtoms.  No further cardiovascular testing is indicated.  We will continue with aggressive risk reduction and meds as listed.   ATRIAL FIBRILLATION:   He is status post watchman with his history of subdural hematoma.  I will check a CBC  ICD: He is up-to-date with follow-up.  CKD:     Creat was not checked recently and I will order a c-Met.   DYSLIPIDEMIA: We will check a fasting lipid profile goal LDL less than 70     Current medicines are reviewed at length with the patient today.  The patient does not have concerns regarding medicines.  The following changes have been made:  no change  Labs/ tests ordered today include: None  Orders Placed This Encounter  Procedures   CBC   Comprehensive metabolic panel   Lipid panel   EKG 12-Lead      Disposition:   FU with me on one year.     Signed, Minus Breeding, MD  08/09/2021 3:36 PM    [facility]

## 2021-08-09 ENCOUNTER — Ambulatory Visit (INDEPENDENT_AMBULATORY_CARE_PROVIDER_SITE_OTHER): Payer: Medicare Other | Admitting: Cardiology

## 2021-08-09 ENCOUNTER — Encounter: Payer: Self-pay | Admitting: Cardiology

## 2021-08-09 ENCOUNTER — Other Ambulatory Visit: Payer: Self-pay

## 2021-08-09 ENCOUNTER — Other Ambulatory Visit: Payer: Self-pay | Admitting: *Deleted

## 2021-08-09 VITALS — BP 100/70 | HR 68 | Ht 73.0 in | Wt 222.0 lb

## 2021-08-09 DIAGNOSIS — E7800 Pure hypercholesterolemia, unspecified: Secondary | ICD-10-CM

## 2021-08-09 DIAGNOSIS — I2510 Atherosclerotic heart disease of native coronary artery without angina pectoris: Secondary | ICD-10-CM

## 2021-08-09 DIAGNOSIS — Z79899 Other long term (current) drug therapy: Secondary | ICD-10-CM

## 2021-08-09 DIAGNOSIS — I420 Dilated cardiomyopathy: Secondary | ICD-10-CM | POA: Diagnosis not present

## 2021-08-09 DIAGNOSIS — Z9889 Other specified postprocedural states: Secondary | ICD-10-CM | POA: Diagnosis not present

## 2021-08-09 DIAGNOSIS — I480 Paroxysmal atrial fibrillation: Secondary | ICD-10-CM | POA: Diagnosis not present

## 2021-08-09 DIAGNOSIS — N1831 Chronic kidney disease, stage 3a: Secondary | ICD-10-CM

## 2021-08-09 MED ORDER — HYDRALAZINE HCL 50 MG PO TABS: 50.0000 mg | ORAL_TABLET | Freq: Two times a day (BID) | ORAL | 3 refills | Status: DC

## 2021-08-09 MED ORDER — FUROSEMIDE 40 MG PO TABS: 40.0000 mg | ORAL_TABLET | Freq: Every day | ORAL | 3 refills | Status: DC

## 2021-08-09 MED ORDER — ISOSORBIDE MONONITRATE ER 60 MG PO TB24: 60.0000 mg | ORAL_TABLET | Freq: Every day | ORAL | 3 refills | Status: DC

## 2021-08-09 MED ORDER — NITROGLYCERIN 0.4 MG SL SUBL
0.4000 mg | SUBLINGUAL_TABLET | SUBLINGUAL | 3 refills | Status: AC | PRN
Start: 2021-08-09 — End: ?

## 2021-08-09 MED ORDER — ATORVASTATIN CALCIUM 80 MG PO TABS: 80.0000 mg | ORAL_TABLET | Freq: Every day | ORAL | 3 refills | Status: DC

## 2021-08-09 MED ORDER — BISOPROLOL FUMARATE 10 MG PO TABS: 10.0000 mg | ORAL_TABLET | Freq: Every day | ORAL | 3 refills | Status: DC

## 2021-08-09 NOTE — Progress Notes (Signed)
Remote ICD transmission.   

## 2021-08-09 NOTE — Patient Instructions (Signed)
Medication Instructions:  The current medical regimen is effective;  continue present plan and medications.  *If you need a refill on your cardiac medications before your next appointment, please call your pharmacy*  Lab Work: Please have blood work at any [facility] (Lipid, CBC and CMP)  If you have labs (blood work) drawn today and your tests are completely normal, you will receive your results only by: MyChart Message (if you have MyChart) OR A paper copy in the mail If you have any lab test that is abnormal or we need to change your treatment, we will call you to review the results.  Follow-Up: At [facility], you and your health needs are our priority.  As part of our continuing mission to provide you with exceptional heart care, we have created designated Provider Care Teams.  These Care Teams include your primary Cardiologist (physician) and Advanced Practice Providers (APPs -  Physician Assistants and Nurse Practitioners) who all work together to provide you with the care you need, when you need it.  We recommend signing up for the patient portal called "MyChart".  Sign up information is provided on this After Visit Summary.  MyChart is used to connect with patients for Virtual Visits (Telemedicine).  Patients are able to view lab/test results, encounter notes, upcoming appointments, etc.  Non-urgent messages can be sent to your provider as well.   To learn more about what you can do with MyChart, go to [email or web address].    Your next appointment:   1 year(s)  The format for your next appointment:   In Person  Provider:   Rollene Rotunda, MD    Thank you for choosing [facility]!!

## 2021-08-21 ENCOUNTER — Ambulatory Visit (INDEPENDENT_AMBULATORY_CARE_PROVIDER_SITE_OTHER): Payer: Medicare Other

## 2021-08-21 DIAGNOSIS — Z95810 Presence of automatic (implantable) cardiac defibrillator: Secondary | ICD-10-CM

## 2021-08-21 DIAGNOSIS — I5022 Chronic systolic (congestive) heart failure: Secondary | ICD-10-CM

## 2021-08-25 NOTE — Progress Notes (Signed)
EPIC Encounter for ICM Monitoring  Patient Name: Lonnie [name] is a 75 y.o. male Date: 08/25/2021 Primary Care Physican: Redmond School, MD Primary Cardiologist: Hochrein Electrophysiologist: Allred 11/11/2020 Office Weight: 225 lbs      Battery ERI: 3.8 months     AT/AF Burden: <1% (he has a watchman; hx of subdural hematoma)           Transmission reviewed.    Corvue thoracic impedance suggesting normal fluid levels.   Prescribed: Furosemide 40 mg 1 tablet daily. (Wife distributes his medications)   Labs: 06/26/2019 Creatinine 1.57, BUN 22, Potassium 4.4, Sodium 144, EGFR 43-50   Recommendations:  No changes   Follow-up plan: ICM clinic phone appointment 09/25/2021.  91 day device clinic remote transmission 08/31/2021.     EP/Cardiology Office Visits:  11/10/2021 with Dr Rayann Heman.   Copy of ICM check sent to Dr. Rayann Heman.     3 month ICM trend: 08/25/2021.    12-14 Month ICM trend:       Rosalene Billings, RN 08/25/2021 2:08 PM

## 2021-08-31 ENCOUNTER — Ambulatory Visit (INDEPENDENT_AMBULATORY_CARE_PROVIDER_SITE_OTHER): Payer: Medicare Other

## 2021-08-31 ENCOUNTER — Encounter: Payer: Self-pay | Admitting: Cardiology

## 2021-08-31 DIAGNOSIS — I420 Dilated cardiomyopathy: Secondary | ICD-10-CM | POA: Diagnosis not present

## 2021-08-31 LAB — CUP PACEART REMOTE DEVICE CHECK
Battery Remaining Longevity: 4 mo
Battery Remaining Percentage: 4 %
Battery Voltage: 2.63 V
Brady Statistic AP VP Percent: 82 %
Brady Statistic AP VS Percent: 1 %
Brady Statistic AS VP Percent: 16 %
Brady Statistic AS VS Percent: 1.4 %
Brady Statistic RA Percent Paced: 82 %
Brady Statistic RV Percent Paced: 98 %
Date Time Interrogation Session: 20221222101436
HighPow Impedance: 66 Ohm
HighPow Impedance: 66 Ohm
Implantable Lead Implant Date: 20150326
Implantable Lead Implant Date: 20150326
Implantable Lead Location: 753859
Implantable Lead Location: 753860
Implantable Pulse Generator Implant Date: 20150326
Lead Channel Impedance Value: 290 Ohm
Lead Channel Impedance Value: 340 Ohm
Lead Channel Pacing Threshold Amplitude: 0.75 V
Lead Channel Pacing Threshold Amplitude: 1.25 V
Lead Channel Pacing Threshold Pulse Width: 0.5 ms
Lead Channel Pacing Threshold Pulse Width: 0.8 ms
Lead Channel Sensing Intrinsic Amplitude: 11.7 mV
Lead Channel Sensing Intrinsic Amplitude: 3 mV
Lead Channel Setting Pacing Amplitude: 2 V
Lead Channel Setting Pacing Amplitude: 2.5 V
Lead Channel Setting Pacing Pulse Width: 0.8 ms
Lead Channel Setting Sensing Sensitivity: 0.5 mV
Pulse Gen Serial Number: 7175865

## 2021-09-06 DIAGNOSIS — Z79899 Other long term (current) drug therapy: Secondary | ICD-10-CM | POA: Diagnosis not present

## 2021-09-06 DIAGNOSIS — E7800 Pure hypercholesterolemia, unspecified: Secondary | ICD-10-CM | POA: Diagnosis not present

## 2021-09-07 ENCOUNTER — Telehealth: Payer: Self-pay

## 2021-09-07 LAB — COMPREHENSIVE METABOLIC PANEL
ALT: 23 IU/L (ref 0–44)
AST: 22 IU/L (ref 0–40)
Albumin/Globulin Ratio: 1.4 (ref 1.2–2.2)
Albumin: 3.9 g/dL (ref 3.7–4.7)
Alkaline Phosphatase: 129 IU/L — ABNORMAL HIGH (ref 44–121)
BUN/Creatinine Ratio: 18 (ref 10–24)
BUN: 23 mg/dL (ref 8–27)
Bilirubin Total: 1 mg/dL (ref 0.0–1.2)
CO2: 25 mmol/L (ref 20–29)
Calcium: 8.9 mg/dL (ref 8.6–10.2)
Chloride: 107 mmol/L — ABNORMAL HIGH (ref 96–106)
Creatinine, Ser: 1.31 mg/dL — ABNORMAL HIGH (ref 0.76–1.27)
Globulin, Total: 2.7 g/dL (ref 1.5–4.5)
Glucose: 124 mg/dL — ABNORMAL HIGH (ref 70–99)
Potassium: 3.9 mmol/L (ref 3.5–5.2)
Sodium: 144 mmol/L (ref 134–144)
Total Protein: 6.6 g/dL (ref 6.0–8.5)
eGFR: 56 mL/min/{1.73_m2} — ABNORMAL LOW (ref >59)

## 2021-09-07 LAB — CBC
Hematocrit: 36.2 % — ABNORMAL LOW (ref 37.5–51.0)
Hemoglobin: 11.8 g/dL — ABNORMAL LOW (ref 13.0–17.7)
MCH: 29.1 pg (ref 26.6–33.0)
MCHC: 32.6 g/dL (ref 31.5–35.7)
MCV: 89 fL (ref 79–97)
Platelets: 236 10*3/uL (ref 150–450)
RBC: 4.06 x10E6/uL — ABNORMAL LOW (ref 4.14–5.80)
RDW: 14.6 % (ref 11.6–15.4)
WBC: 6.8 10*3/uL (ref 3.4–10.8)

## 2021-09-07 LAB — LIPID PANEL
Chol/HDL Ratio: 2.4 ratio (ref 0.0–5.0)
Cholesterol, Total: 124 mg/dL (ref 100–199)
HDL: 51 mg/dL (ref >39)
LDL Chol Calc (NIH): 58 mg/dL (ref 0–99)
Triglycerides: 75 mg/dL (ref 0–149)
VLDL Cholesterol Cal: 15 mg/dL (ref 5–40)

## 2021-09-07 NOTE — Telephone Encounter (Addendum)
Tired calling patient and could not leave a voice message due to it not being set up. Will try calling again  ----- Message from Rollene Rotunda, MD sent at 09/07/2021  9:59 AM EST ----- Lipids are at target.  Creat is elevated but stable.  Anemic but unchnaged from previous.  Call Mr. Zimmers with the results and send results to Elfredia Nevins, MD

## 2021-09-12 NOTE — Progress Notes (Signed)
Remote ICD transmission.   

## 2021-09-25 ENCOUNTER — Ambulatory Visit (INDEPENDENT_AMBULATORY_CARE_PROVIDER_SITE_OTHER): Payer: Medicare Other

## 2021-09-25 DIAGNOSIS — I5022 Chronic systolic (congestive) heart failure: Secondary | ICD-10-CM

## 2021-09-25 DIAGNOSIS — Z95810 Presence of automatic (implantable) cardiac defibrillator: Secondary | ICD-10-CM

## 2021-09-27 NOTE — Progress Notes (Signed)
EPIC Encounter for ICM Monitoring  Patient Name: Lonnie [name] is a 76 y.o. male Date: 09/27/2021 Primary Care Physican: Redmond School, MD Primary Cardiologist: Hochrein Electrophysiologist: Allred 11/11/2020 Office Weight: 225 lbs      Battery ERI: 3.6 months     AT/AF Burden: <1% (he has a watchman; hx of subdural hematoma)           Transmission reviewed.    Corvue thoracic impedance suggesting normal fluid levels.   Prescribed: Furosemide 40 mg 1 tablet daily. (Wife distributes his medications)   Labs: 06/26/2019 Creatinine 1.57, BUN 22, Potassium 4.4, Sodium 144, EGFR 43-50   Recommendations:  No changes   Follow-up plan: ICM clinic phone appointment 10/30/2021.  91 day device clinic remote transmission 11/13/2021.     EP/Cardiology Office Visits:  11/10/2021 with Dr Rayann Heman.   Copy of ICM check sent to Dr. Rayann Heman.     3 month ICM trend: 09/27/2021.    12-14 Month ICM trend:     Rosalene Billings, RN 09/27/2021 11:57 AM

## 2021-10-04 ENCOUNTER — Telehealth: Payer: Self-pay | Admitting: Internal Medicine

## 2021-10-04 ENCOUNTER — Encounter: Payer: Self-pay | Admitting: Internal Medicine

## 2021-10-05 NOTE — Telephone Encounter (Signed)
Note mailed to patient about trying to re-schedule his appointment

## 2021-10-09 ENCOUNTER — Ambulatory Visit (INDEPENDENT_AMBULATORY_CARE_PROVIDER_SITE_OTHER): Payer: Medicare Other

## 2021-10-09 DIAGNOSIS — I420 Dilated cardiomyopathy: Secondary | ICD-10-CM

## 2021-10-09 LAB — CUP PACEART REMOTE DEVICE CHECK
Battery Remaining Longevity: 4 mo
Battery Remaining Percentage: 4 %
Battery Voltage: 2.62 V
Brady Statistic AP VP Percent: 81 %
Brady Statistic AP VS Percent: 1 %
Brady Statistic AS VP Percent: 16 %
Brady Statistic AS VS Percent: 1.4 %
Brady Statistic RA Percent Paced: 82 %
Brady Statistic RV Percent Paced: 98 %
Date Time Interrogation Session: 20230130042719
HighPow Impedance: 64 Ohm
HighPow Impedance: 64 Ohm
Implantable Lead Implant Date: 20150326
Implantable Lead Implant Date: 20150326
Implantable Lead Location: 753859
Implantable Lead Location: 753860
Implantable Pulse Generator Implant Date: 20150326
Lead Channel Impedance Value: 280 Ohm
Lead Channel Impedance Value: 310 Ohm
Lead Channel Pacing Threshold Amplitude: 0.75 V
Lead Channel Pacing Threshold Amplitude: 1.25 V
Lead Channel Pacing Threshold Pulse Width: 0.5 ms
Lead Channel Pacing Threshold Pulse Width: 0.8 ms
Lead Channel Sensing Intrinsic Amplitude: 2.9 mV
Lead Channel Sensing Intrinsic Amplitude: 8.4 mV
Lead Channel Setting Pacing Amplitude: 2 V
Lead Channel Setting Pacing Amplitude: 2.5 V
Lead Channel Setting Pacing Pulse Width: 0.8 ms
Lead Channel Setting Sensing Sensitivity: 0.5 mV
Pulse Gen Serial Number: 7175865

## 2021-10-13 ENCOUNTER — Telehealth: Payer: Self-pay | Admitting: *Deleted

## 2021-10-13 ENCOUNTER — Encounter: Payer: Self-pay | Admitting: *Deleted

## 2021-10-13 NOTE — Telephone Encounter (Signed)
-----   Message from Hillis Range, MD sent at 10/12/2021  8:43 PM EST ----- Remote device check reviewed.   Device report notable for:  approaching ERI  Lonnie [name], please obtain an echo prior to his visit with me March 3/23 to evaluate his EF.

## 2021-10-13 NOTE — Telephone Encounter (Signed)
Laurine Blazer, LPN  [phone]  [id number] PM EST Back to Top    Notified via my chart.  Copy to pcp.

## 2021-10-17 NOTE — Progress Notes (Signed)
Remote ICD transmission.   

## 2021-10-30 ENCOUNTER — Ambulatory Visit (INDEPENDENT_AMBULATORY_CARE_PROVIDER_SITE_OTHER): Payer: Medicare Other

## 2021-10-30 DIAGNOSIS — I5022 Chronic systolic (congestive) heart failure: Secondary | ICD-10-CM | POA: Diagnosis not present

## 2021-10-30 DIAGNOSIS — Z95810 Presence of automatic (implantable) cardiac defibrillator: Secondary | ICD-10-CM | POA: Diagnosis not present

## 2021-10-31 LAB — CUP PACEART REMOTE DEVICE CHECK
Battery Remaining Longevity: 1 mo
Battery Remaining Percentage: 3 %
Battery Voltage: 2.6 V
Brady Statistic AP VP Percent: 81 %
Brady Statistic AP VS Percent: 1 %
Brady Statistic AS VP Percent: 17 %
Brady Statistic AS VS Percent: 1.4 %
Brady Statistic RA Percent Paced: 81 %
Brady Statistic RV Percent Paced: 98 %
Date Time Interrogation Session: 20230220043502
HighPow Impedance: 64 Ohm
HighPow Impedance: 64 Ohm
Implantable Lead Implant Date: 20150326
Implantable Lead Implant Date: 20150326
Implantable Lead Location: 753859
Implantable Lead Location: 753860
Implantable Pulse Generator Implant Date: 20150326
Lead Channel Impedance Value: 290 Ohm
Lead Channel Impedance Value: 340 Ohm
Lead Channel Pacing Threshold Amplitude: 0.75 V
Lead Channel Pacing Threshold Amplitude: 1.25 V
Lead Channel Pacing Threshold Pulse Width: 0.5 ms
Lead Channel Pacing Threshold Pulse Width: 0.8 ms
Lead Channel Sensing Intrinsic Amplitude: 11.4 mV
Lead Channel Sensing Intrinsic Amplitude: 2.8 mV
Lead Channel Setting Pacing Amplitude: 2 V
Lead Channel Setting Pacing Amplitude: 2.5 V
Lead Channel Setting Pacing Pulse Width: 0.8 ms
Lead Channel Setting Sensing Sensitivity: 0.5 mV
Pulse Gen Serial Number: 7175865

## 2021-11-01 ENCOUNTER — Ambulatory Visit (INDEPENDENT_AMBULATORY_CARE_PROVIDER_SITE_OTHER): Payer: Medicare Other

## 2021-11-01 DIAGNOSIS — I420 Dilated cardiomyopathy: Secondary | ICD-10-CM

## 2021-11-03 ENCOUNTER — Other Ambulatory Visit: Payer: Self-pay | Admitting: Cardiology

## 2021-11-03 NOTE — Progress Notes (Signed)
EPIC Encounter for ICM Monitoring  Patient Name: Lonnie [name] is a 76 y.o. male Date: 11/03/2021 Primary Care Physican: Elfredia Nevins, MD Primary Cardiologist: Hochrein Electrophysiologist: Allred 11/11/2020 Office Weight: 225 lbs      Battery ERI: <3 months     AT/AF Burden: <1% (he has a watchman; hx of subdural hematoma)           Transmission reviewed.    Corvue thoracic impedance suggesting normal fluid levels.   Prescribed: Furosemide 40 mg 1 tablet daily. (Wife distributes his medications)   Labs: 09/06/2021 Creatinine 1.31, BUN 23, Potassium 3.9, Sodium 144, GFR 56 A complete set of results can be found in Results Review.   Recommendations:  No changes   Follow-up plan: ICM clinic phone appointment 12/04/2021.  91 day device clinic remote transmission 11/13/2021.     EP/Cardiology Office Visits:  11/17/2021 with Dr Johney Frame.   Copy of ICM check sent to Dr. Johney Frame.      3 month ICM trend: 10/30/2021.    12-14 Month ICM trend:     [name], RN 11/03/2021 4:33 PM

## 2021-11-08 NOTE — Progress Notes (Signed)
Remote ICD transmission.   

## 2021-11-10 ENCOUNTER — Encounter: Payer: Medicare Other | Admitting: Internal Medicine

## 2021-11-17 ENCOUNTER — Other Ambulatory Visit: Payer: Self-pay

## 2021-11-17 ENCOUNTER — Encounter: Payer: Self-pay | Admitting: Internal Medicine

## 2021-11-17 ENCOUNTER — Ambulatory Visit (INDEPENDENT_AMBULATORY_CARE_PROVIDER_SITE_OTHER): Payer: Medicare Other | Admitting: Internal Medicine

## 2021-11-17 VITALS — BP 118/70 | HR 66 | Ht 72.0 in | Wt 225.0 lb

## 2021-11-17 DIAGNOSIS — I5022 Chronic systolic (congestive) heart failure: Secondary | ICD-10-CM

## 2021-11-17 DIAGNOSIS — I480 Paroxysmal atrial fibrillation: Secondary | ICD-10-CM | POA: Diagnosis not present

## 2021-11-17 DIAGNOSIS — Z9889 Other specified postprocedural states: Secondary | ICD-10-CM | POA: Diagnosis not present

## 2021-11-17 NOTE — Progress Notes (Signed)
? ?PCP: Redmond School, MD ?Primary Cardiologist: Dr Percival Spanish ?Primary EP: Dr Rayann Heman ? ?Lonnie [name] is a 76 y.o. male who presents today for routine electrophysiology followup.  Since last being seen in our clinic, the patient reports doing very well.  Today, he denies symptoms of palpitations, chest pain, shortness of breath,  lower extremity edema, dizziness, presyncope, syncope, or ICD shocks.  The patient is otherwise without complaint today.  ? ?Past Medical History:  ?Diagnosis Date  ? Atrial tachycardia ([place])   ? CAD (coronary artery disease)   ? LAD 95% proximal stenosis, D1 50-60% stenosis, the circumflex 40% stenosis, RCA subtotal stenosis.  ? Cardiomyopathy, ischemic   ? EF was 30-35% by echo but 45-50% by cath.  Most recent EF 15%.    ? Complication of anesthesia   ? "Hard time waking me up" after sedation after dental procedure  ? Constipation   ? H/O cardiac arrest   ? High cholesterol   ? Hypertension   ? Mitral regurgitation   ? Secondary to papillary muscle rupture  ? Persistent atrial fibrillation ([place])   ? sp Watchman implant  ? Reflux   ? Ventricular fibrillation ([place]) 02/16/14  ? successfully defibrillation by his ICD  ? ?Past Surgical History:  ?Procedure Laterality Date  ? CARDIOVERSION N/A 01/25/2014  ? Procedure: CARDIOVERSION;  Surgeon: Josue Hector, MD;  Location: [facility] ENDOSCOPY;  Service: Cardiovascular;  Laterality: N/A;  ? CORONARY ARTERY BYPASS GRAFT    ? ENDOVEIN HARVEST OF GREATER SAPHENOUS VEIN Right 02/05/2013  ? Procedure: ENDOVEIN HARVEST OF GREATER SAPHENOUS VEIN;  Surgeon: Melrose Nakayama, MD;  Location: [place];  Service: Open Heart Surgery;  Laterality: Right;  ? IMPLANTABLE CARDIOVERTER DEFIBRILLATOR IMPLANT  12/03/2013  ? STJ Fortify ICD implanted for secondary prevention by Dr Rayann Heman  ? IMPLANTABLE CARDIOVERTER DEFIBRILLATOR IMPLANT N/A 12/03/2013  ? Procedure: IMPLANTABLE CARDIOVERTER DEFIBRILLATOR IMPLANT;  Surgeon: Coralyn Mark, MD;  Location: [place] CATH LAB;   Service: Cardiovascular;  Laterality: N/A;  ? INTRAOPERATIVE TRANSESOPHAGEAL ECHOCARDIOGRAM N/A 02/05/2013  ? Procedure: INTRAOPERATIVE TRANSESOPHAGEAL ECHOCARDIOGRAM;  Surgeon: Melrose Nakayama, MD;  Location: [place];  Service: Open Heart Surgery;  Laterality: N/A;  ? LEFT ATRIAL APPENDAGE OCCLUSION N/A 01/26/2016  ? Procedure: LEFT ATRIAL APPENDAGE OCCLUSION;  Surgeon: Thompson Grayer, MD;  Location: [place] CV LAB;  Service: Cardiovascular;  Laterality: N/A;  ? LEFT HEART CATHETERIZATION WITH CORONARY ANGIOGRAM N/A 02/03/2013  ? Procedure: LEFT HEART CATHETERIZATION WITH CORONARY ANGIOGRAM;  Surgeon: Wellington Hampshire, MD;  Location: [place] CATH LAB;  Service: Cardiovascular;  Laterality: N/A;  ? LEFT HEART CATHETERIZATION WITH CORONARY/GRAFT ANGIOGRAM N/A 11/01/2013  ? Procedure: LEFT HEART CATHETERIZATION WITH Beatrix Fetters;  Surgeon: Peter M Martinique, MD;  Location: [facility] CATH LAB;  Service: Cardiovascular;  Laterality: N/A;  ? MITRAL VALVE REPLACEMENT (MVR)/CORONARY ARTERY BYPASS GRAFTING (CABG) N/A 02/05/2013  ? Procedure: MITRAL VALVE REPLACEMENT (MVR)/CORONARY ARTERY BYPASS GRAFTING (CABG);  Surgeon: Melrose Nakayama, MD;  Location: [place];  Service: Open Heart Surgery;  Laterality: N/A;  x3 using right greater saphenous vein and left internal mammary.   ? PATENT FORAMEN OVALE CLOSURE N/A 02/05/2013  ? Procedure: PATENT FORAMEN OVALE CLOSURE;  Surgeon: Melrose Nakayama, MD;  Location: [place];  Service: Open Heart Surgery;  Laterality: N/A;  ? stomach ulcer repair    ? TEE WITHOUT CARDIOVERSION N/A 02/04/2013  ? Procedure: TRANSESOPHAGEAL ECHOCARDIOGRAM (TEE);  Surgeon: Thayer Headings, MD;  Location: [place];  Service: Cardiovascular;  Laterality: N/A;  ?  TEE WITHOUT CARDIOVERSION N/A 12/05/2015  ? Procedure: TRANSESOPHAGEAL ECHOCARDIOGRAM (TEE);  Surgeon: Lelon Perla, MD;  Location: [place];  Service: Cardiovascular;  Laterality: N/A;  ? TEE WITHOUT CARDIOVERSION N/A 03/15/2016  ?  Procedure: TRANSESOPHAGEAL ECHOCARDIOGRAM (TEE);  Surgeon: Pixie Casino, MD;  Location: [facility] ENDOSCOPY;  Service: Cardiovascular;  Laterality: N/A;  ? ? ?ROS- all systems are reviewed and negative except as per HPI above ? ?Current Outpatient Medications  ?Medication Sig Dispense Refill  ? acetaminophen (TYLENOL) 500 MG tablet Take 1,000 mg by mouth daily as needed for mild pain.     ? aspirin 325 MG tablet Take 325 mg by mouth daily.    ? atorvastatin (LIPITOR) 80 MG tablet Take 1 tablet (80 mg total) by mouth daily. 90 tablet 3  ? bisoprolol (ZEBETA) 10 MG tablet Take 1 tablet (10 mg total) by mouth daily. 90 tablet 3  ? furosemide (LASIX) 40 MG tablet Take 1 tablet (40 mg total) by mouth daily. 90 tablet 3  ? hydrALAZINE (APRESOLINE) 50 MG tablet Take 1 tablet (50 mg total) by mouth 2 (two) times daily. 180 tablet 3  ? isosorbide mononitrate (IMDUR) 60 MG 24 hr tablet Take 1 tablet by mouth once daily 90 tablet 3  ? nitroGLYCERIN (NITROSTAT) 0.4 MG SL tablet Place 1 tablet (0.4 mg total) under the tongue every 5 (five) minutes as needed for chest pain. 25 tablet 3  ? ?No current facility-administered medications for this visit.  ? ? ?Physical Exam: ?Vitals:  ? 11/17/21 1528  ?BP: 118/70  ?Pulse: 66  ?SpO2: 93%  ?Weight: 225 lb (102.1 kg)  ?Height: 6' (1.829 m)  ? ? ?GEN- The patient is well appearing, alert and oriented x 3 today.   ?Head- normocephalic, atraumatic ?Eyes-  Sclera clear, conjunctiva pink ?Ears- hearing intact ?Oropharynx- clear ?Lungs- Clear to ausculation bilaterally, normal work of breathing ?Chest- ICD pocket is well healed ?Heart- Regular rate and rhythm, no murmurs, rubs or gallops, PMI not laterally displaced ?GI- soft, NT, ND, + BS ?Extremities- no clubbing, cyanosis, or edema ? ?ICD interrogation- reviewed in detail today,  See PACEART report ?  ? ?Wt Readings from Last 3 Encounters:  ?11/17/21 225 lb (102.1 kg)  ?08/09/21 222 lb (100.7 kg)  ?11/11/20 225 lb 6.4 oz (102.2 kg)   ? ? ?Assessment and Plan: ? ?1.  Chronic systolic dysfunction/ ischemic CM/ CAD ?euvolemic today ?No ischemic symptoms ?Update echo ?Stable on an appropriate medical regimen ?Normal ICD function ?Approaching ERI ?See Claudia Desanctis Art report ?No changes today ?he is not device dependant today ?followed in ICM device clinic ?He V paces 100%.  Approaching ERI.  I would advise upgrade to CRT-D at time of generator change.  As I am working parttime now, I will schedule with Dr Lovena Le. ?Risks, benefits, and alternatives to upgrade to CRT-D were discussed in detail today.  The patient understands that risks include but are not limited to bleeding, infection, pneumothorax, perforation, tamponade, vascular damage, renal failure, MI, stroke, death, inappropriate shocks, damage to his existing leads, and lead dislodgement and wishes to proceed.  We will therefore schedule the procedure at the next available time with Dr Lovena Le. ? ? ?2. Prior VF arrest ?Arrhythmias are controlled ? ?3. Afib ?Well controlled ?S/p watchman with prior SDH ?Hold ASA 7 days prior to the procedure. ? ?4. HTN ?Stable ?No change required today ? ?5. S/p MVR ?Stable ?No change required today ? ?  ?Thompson Grayer MD, [facility] ?11/17/2021 ?3:47 PM ? ?

## 2021-11-17 NOTE — Patient Instructions (Signed)
Medication Instructions:  ?Continue all current medications. ? ?Labwork: ?none ? ?Testing/Procedures: ?Gen change with upgrade  ? ?Follow-Up: ?Pending  ? ?Any Other Special Instructions Will Be Listed Below (If Applicable). ?Dr. Ladona Ridgel staff will call.  ? ?If you need a refill on your cardiac medications before your next appointment, please call your pharmacy. ? ?

## 2021-11-28 ENCOUNTER — Telehealth: Payer: Self-pay

## 2021-11-28 DIAGNOSIS — I5022 Chronic systolic (congestive) heart failure: Secondary | ICD-10-CM

## 2021-11-28 NOTE — Telephone Encounter (Signed)
Left message requesting call back. ? ?Will send mychart message with available dates for potential BIV ICD upgrade. ?

## 2021-11-28 NOTE — Telephone Encounter (Signed)
Pt scheduled for BIV ICD upgrade on December 13, 2021 at 12:30 pm with Dr. Ladona Ridgel ? ?Will get lab work at [facility] ? ?Will have Pt pick up surgical scrub at [facility] office ?

## 2021-11-29 NOTE — Telephone Encounter (Signed)
Work up complete. 

## 2021-12-04 ENCOUNTER — Ambulatory Visit (INDEPENDENT_AMBULATORY_CARE_PROVIDER_SITE_OTHER): Payer: Medicare Other

## 2021-12-04 DIAGNOSIS — I5022 Chronic systolic (congestive) heart failure: Secondary | ICD-10-CM | POA: Diagnosis not present

## 2021-12-04 DIAGNOSIS — Z95810 Presence of automatic (implantable) cardiac defibrillator: Secondary | ICD-10-CM | POA: Diagnosis not present

## 2021-12-05 ENCOUNTER — Other Ambulatory Visit (HOSPITAL_COMMUNITY)
Admission: RE | Admit: 2021-12-05 | Discharge: 2021-12-05 | Disposition: A | Discharge status: Home / Self Care | Payer: Medicare Other | Source: Ambulatory Visit | Attending: Internal Medicine | Admitting: Internal Medicine

## 2021-12-05 DIAGNOSIS — I5022 Chronic systolic (congestive) heart failure: Secondary | ICD-10-CM | POA: Insufficient documentation

## 2021-12-05 LAB — CBC WITH DIFFERENTIAL/PLATELET
Abs Immature Granulocytes: 0.05 10*3/uL (ref 0.00–0.07)
Basophils Absolute: 0 10*3/uL (ref 0.0–0.1)
Basophils Relative: 0 %
Eosinophils Absolute: 0.2 10*3/uL (ref 0.0–0.5)
Eosinophils Relative: 2 %
HCT: 36.3 % — ABNORMAL LOW (ref 39.0–52.0)
Hemoglobin: 11.8 g/dL — ABNORMAL LOW (ref 13.0–17.0)
Immature Granulocytes: 1 %
Lymphocytes Relative: 24 %
Lymphs Abs: 1.8 10*3/uL (ref 0.7–4.0)
MCH: 30.5 pg (ref 26.0–34.0)
MCHC: 32.5 g/dL (ref 30.0–36.0)
MCV: 93.8 fL (ref 80.0–100.0)
Monocytes Absolute: 0.7 10*3/uL (ref 0.1–1.0)
Monocytes Relative: 9 %
Neutro Abs: 4.7 10*3/uL (ref 1.7–7.7)
Neutrophils Relative %: 64 %
Platelets: 240 10*3/uL (ref 150–400)
RBC: 3.87 MIL/uL — ABNORMAL LOW (ref 4.22–5.81)
RDW: 16.7 % — ABNORMAL HIGH (ref 11.5–15.5)
WBC: 7.4 10*3/uL (ref 4.0–10.5)
nRBC: 0 % (ref 0.0–0.2)

## 2021-12-05 LAB — BASIC METABOLIC PANEL
Anion gap: 9 (ref 5–15)
BUN: 21 mg/dL (ref 8–23)
CO2: 27 mmol/L (ref 22–32)
Calcium: 8.5 mg/dL — ABNORMAL LOW (ref 8.9–10.3)
Chloride: 104 mmol/L (ref 98–111)
Creatinine, Ser: 1.65 mg/dL — ABNORMAL HIGH (ref 0.61–1.24)
GFR, Estimated: 43 mL/min — ABNORMAL LOW (ref >60)
Glucose, Bld: 121 mg/dL — ABNORMAL HIGH (ref 70–99)
Potassium: 3.7 mmol/L (ref 3.5–5.1)
Sodium: 140 mmol/L (ref 135–145)

## 2021-12-06 NOTE — Progress Notes (Signed)
EPIC Encounter for ICM Monitoring ? ?Patient Name: Lonnie [name] is a 76 y.o. male ?Date: 12/06/2021 ?Primary Care Physican: Elfredia Nevins, MD ?Primary Cardiologist: Hochrein ?Electrophysiologist: Allred ?11/17/2020 Office Weight: 225 lbs    ?  ?AT/AF Burden: <1% (he has a watchman; hx of subdural hematoma) ?  ?  ?      Transmission reviewed.  Scheduled for battery replacement 4/5.  ?  ?Corvue thoracic impedance suggesting normal fluid levels. ?  ?Prescribed: Furosemide 40 mg 1 tablet daily. (Wife distributes his medications) ?  ?Labs: ?09/06/2021 Creatinine 1.31, BUN 23, Potassium 3.9, Sodium 144, GFR 56 ?A complete set of results can be found in Results Review. ?  ?Recommendations:  No changes ?  ?Follow-up plan: ICM clinic phone appointment 01/22/2022.  91 day device clinic remote transmission pending battery replacement.   ?  ?EP/Cardiology Office Visits:  03/20/2022 with Dr Ladona Ridgel. ?  ?Copy of ICM check sent to Dr. Johney Frame.     ? ?3 month ICM trend: 12/04/2021. ? ? ? ?12-14 Month ICM trend:  ? ? ? ?[name], RN ?12/06/2021 ?5:09 PM ? ?

## 2021-12-11 NOTE — Pre-Procedure Instructions (Signed)
Attempted to call patient regarding procedure instructions for Wednesday.  No answer, voice mail not set up. ?

## 2021-12-12 NOTE — Pre-Procedure Instructions (Signed)
Attempted to call patient regarding procedure instructions.  Left voice mail on the cell #  on the following items: ?Arrival time 1030 ?Nothing to eat or drink after midnight ?No meds AM of procedure ?Responsible person to drive you home and stay with you for 24 hrs ?Wash with special soap night before and morning of procedure ? ?

## 2021-12-13 ENCOUNTER — Ambulatory Visit (HOSPITAL_COMMUNITY)
Admission: RE | Admit: 2021-12-13 | Discharge: 2021-12-13 | Disposition: A | Discharge status: Home / Self Care | Payer: Medicare Other | Attending: Internal Medicine | Admitting: Internal Medicine

## 2021-12-13 ENCOUNTER — Ambulatory Visit (HOSPITAL_COMMUNITY): Payer: Medicare Other

## 2021-12-13 ENCOUNTER — Ambulatory Visit (HOSPITAL_COMMUNITY)
Admission: RE | Disposition: A | Discharge status: Home / Self Care | Payer: Medicare Other | Source: Home / Self Care | Attending: Internal Medicine

## 2021-12-13 DIAGNOSIS — Z951 Presence of aortocoronary bypass graft: Secondary | ICD-10-CM | POA: Insufficient documentation

## 2021-12-13 DIAGNOSIS — I4819 Other persistent atrial fibrillation: Secondary | ICD-10-CM | POA: Diagnosis not present

## 2021-12-13 DIAGNOSIS — I5022 Chronic systolic (congestive) heart failure: Secondary | ICD-10-CM | POA: Diagnosis not present

## 2021-12-13 DIAGNOSIS — Z7982 Long term (current) use of aspirin: Secondary | ICD-10-CM | POA: Insufficient documentation

## 2021-12-13 DIAGNOSIS — Z952 Presence of prosthetic heart valve: Secondary | ICD-10-CM | POA: Diagnosis not present

## 2021-12-13 DIAGNOSIS — I2510 Atherosclerotic heart disease of native coronary artery without angina pectoris: Secondary | ICD-10-CM | POA: Diagnosis not present

## 2021-12-13 DIAGNOSIS — E7800 Pure hypercholesterolemia, unspecified: Secondary | ICD-10-CM | POA: Insufficient documentation

## 2021-12-13 DIAGNOSIS — I110 Hypertensive heart disease with heart failure: Secondary | ICD-10-CM | POA: Insufficient documentation

## 2021-12-13 DIAGNOSIS — I255 Ischemic cardiomyopathy: Secondary | ICD-10-CM | POA: Diagnosis not present

## 2021-12-13 DIAGNOSIS — I447 Left bundle-branch block, unspecified: Secondary | ICD-10-CM | POA: Diagnosis not present

## 2021-12-13 DIAGNOSIS — I5032 Chronic diastolic (congestive) heart failure: Secondary | ICD-10-CM | POA: Diagnosis not present

## 2021-12-13 DIAGNOSIS — Z8674 Personal history of sudden cardiac arrest: Secondary | ICD-10-CM | POA: Insufficient documentation

## 2021-12-13 DIAGNOSIS — I442 Atrioventricular block, complete: Secondary | ICD-10-CM | POA: Insufficient documentation

## 2021-12-13 DIAGNOSIS — Z950 Presence of cardiac pacemaker: Secondary | ICD-10-CM | POA: Diagnosis not present

## 2021-12-13 DIAGNOSIS — Z4502 Encounter for adjustment and management of automatic implantable cardiac defibrillator: Secondary | ICD-10-CM

## 2021-12-13 DIAGNOSIS — Z79899 Other long term (current) drug therapy: Secondary | ICD-10-CM | POA: Insufficient documentation

## 2021-12-13 DIAGNOSIS — Z95818 Presence of other cardiac implants and grafts: Secondary | ICD-10-CM | POA: Diagnosis not present

## 2021-12-13 DIAGNOSIS — I472 Ventricular tachycardia, unspecified: Secondary | ICD-10-CM | POA: Diagnosis not present

## 2021-12-13 HISTORY — PX: BIV UPGRADE: EP1202

## 2021-12-13 SURGERY — BIV UPGRADE

## 2021-12-13 MED ORDER — MIDAZOLAM HCL 5 MG/5ML IJ SOLN
INTRAMUSCULAR | Status: AC
  Filled 2021-12-13: qty 5

## 2021-12-13 MED ORDER — LIDOCAINE HCL (PF) 1 % IJ SOLN
INTRAMUSCULAR | Status: DC | PRN
  Administered 2021-12-13: 60 mL

## 2021-12-13 MED ORDER — LIDOCAINE HCL 1 % IJ SOLN
INTRAMUSCULAR | Status: AC
  Filled 2021-12-13: qty 20

## 2021-12-13 MED ORDER — IOHEXOL 350 MG/ML SOLN
INTRAVENOUS | Status: DC | PRN
  Administered 2021-12-13: 20 mL

## 2021-12-13 MED ORDER — MIDAZOLAM HCL 5 MG/5ML IJ SOLN
INTRAMUSCULAR | Status: DC | PRN
  Administered 2021-12-13 (×2): 2 mg via INTRAVENOUS
  Administered 2021-12-13: 1 mg via INTRAVENOUS

## 2021-12-13 MED ORDER — ACETAMINOPHEN 325 MG PO TABS
325.0000 mg | ORAL_TABLET | ORAL | Status: DC | PRN
  Filled 2021-12-13: qty 2

## 2021-12-13 MED ORDER — SODIUM CHLORIDE 0.9 % IV SOLN
INTRAVENOUS | Status: AC
  Filled 2021-12-13: qty 2

## 2021-12-13 MED ORDER — SODIUM CHLORIDE 0.9 % IV SOLN: INTRAVENOUS | Status: DC

## 2021-12-13 MED ORDER — POVIDONE-IODINE 10 % EX SWAB
2.0000 "application " | Freq: Once | CUTANEOUS | Status: AC
  Administered 2021-12-13: 2 via TOPICAL

## 2021-12-13 MED ORDER — SODIUM CHLORIDE 0.9 % IV SOLN
80.0000 mg | INTRAVENOUS | Status: AC
  Administered 2021-12-13: 80 mg

## 2021-12-13 MED ORDER — ONDANSETRON HCL 4 MG/2ML IJ SOLN: 4.0000 mg | Freq: Four times a day (QID) | INTRAMUSCULAR | Status: DC | PRN

## 2021-12-13 MED ORDER — FENTANYL CITRATE (PF) 100 MCG/2ML IJ SOLN
INTRAMUSCULAR | Status: DC | PRN
Start: 2021-12-13 — End: 2021-12-13
  Administered 2021-12-13 (×2): 25 ug via INTRAVENOUS

## 2021-12-13 MED ORDER — HEPARIN (PORCINE) IN NACL 1000-0.9 UT/500ML-% IV SOLN
INTRAVENOUS | Status: AC
  Filled 2021-12-13: qty 500

## 2021-12-13 MED ORDER — FENTANYL CITRATE (PF) 100 MCG/2ML IJ SOLN
INTRAMUSCULAR | Status: AC
  Filled 2021-12-13: qty 2

## 2021-12-13 MED ORDER — CEFAZOLIN SODIUM-DEXTROSE 1-4 GM/50ML-% IV SOLN
1.0000 g | INTRAVENOUS | Status: DC
  Filled 2021-12-13: qty 50

## 2021-12-13 MED ORDER — CHLORHEXIDINE GLUCONATE 4 % EX LIQD
4.0000 "application " | Freq: Once | CUTANEOUS | Status: DC
  Filled 2021-12-13: qty 60

## 2021-12-13 MED ORDER — CEFAZOLIN SODIUM-DEXTROSE 2-4 GM/100ML-% IV SOLN
2.0000 g | INTRAVENOUS | Status: AC
  Administered 2021-12-13: 2 g via INTRAVENOUS

## 2021-12-13 MED ORDER — CEFAZOLIN SODIUM-DEXTROSE 2-4 GM/100ML-% IV SOLN
INTRAVENOUS | Status: AC
  Filled 2021-12-13: qty 100

## 2021-12-13 MED ORDER — HEPARIN (PORCINE) IN NACL 1000-0.9 UT/500ML-% IV SOLN
INTRAVENOUS | Status: DC | PRN
Start: 2021-12-13 — End: 2021-12-13
  Administered 2021-12-13: 500 mL

## 2021-12-13 SURGICAL SUPPLY — 12 items
CABLE SURGICAL S-101-97-12 (CABLE) ×2 IMPLANT
CATH CPS DIRECT 135 DS2C020 (CATHETERS) ×1 IMPLANT
CATH HEX JOS 2-5-2 65CM 6F REP (CATHETERS) ×1 IMPLANT
GUIDEWIRE ANGLED .035X150CM (WIRE) ×1 IMPLANT
ICD GALLANT HFCRTD CDHFA500Q (ICD Generator) ×1 IMPLANT
LEAD QUARTET 1456Q-86 (Lead) IMPLANT
PAD DEFIB RADIO PHYSIO CONN (PAD) ×2 IMPLANT
QUARTET 1456Q-86 (Lead) ×2 IMPLANT
SHEATH 9FR PRELUDE SNAP 13 (SHEATH) ×1 IMPLANT
SLITTER UNIVERSAL DS2A003 (MISCELLANEOUS) ×1 IMPLANT
TRAY PACEMAKER INSERTION (PACKS) ×2 IMPLANT
WIRE ACUITY WHISPER EDS 4648 (WIRE) ×1 IMPLANT

## 2021-12-13 NOTE — H&P (Signed)
?PCP: Elfredia Nevins, MD ?Primary Cardiologist: Dr Antoine Poche ?Primary EP: Dr Johney Frame ?  ?Lonnie [name] is a 76 y.o. male who presents today for routine electrophysiology followup.  Since last being seen in our clinic, the patient reports doing very well.  Today, he denies symptoms of palpitations, chest pain, shortness of breath,  lower extremity edema, dizziness, presyncope, syncope, or ICD shocks.  The patient is otherwise without complaint today.  ?  ?    ?Past Medical History:  ?Diagnosis Date  ? Atrial tachycardia ([facility])    ? CAD (coronary artery disease)    ?  LAD 95% proximal stenosis, D1 50-60% stenosis, the circumflex 40% stenosis, RCA subtotal stenosis.  ? Cardiomyopathy, ischemic    ?  EF was 30-35% by echo but 45-50% by cath.  Most recent EF 15%.    ? Complication of anesthesia    ?  "Hard time waking me up" after sedation after dental procedure  ? Constipation    ? H/O cardiac arrest    ? High cholesterol    ? Hypertension    ? Mitral regurgitation    ?  Secondary to papillary muscle rupture  ? Persistent atrial fibrillation ([facility])    ?  sp Watchman implant  ? Reflux    ? Ventricular fibrillation ([facility]) 02/16/14  ?  successfully defibrillation by his ICD  ?  ?     ?Past Surgical History:  ?Procedure Laterality Date  ? CARDIOVERSION N/A 01/25/2014  ?  Procedure: CARDIOVERSION;  Surgeon: Wendall Stade, MD;  Location: [facility] ENDOSCOPY;  Service: Cardiovascular;  Laterality: N/A;  ? CORONARY ARTERY BYPASS GRAFT      ? ENDOVEIN HARVEST OF GREATER SAPHENOUS VEIN Right 02/05/2013  ?  Procedure: ENDOVEIN HARVEST OF GREATER SAPHENOUS VEIN;  Surgeon: [name], MD;  [facility];  Service: Open Heart Surgery;  Laterality: Right;  ? IMPLANTABLE CARDIOVERTER DEFIBRILLATOR IMPLANT   12/03/2013  ?  STJ Fortify ICD implanted for secondary prevention by Dr Johney Frame  ? IMPLANTABLE CARDIOVERTER DEFIBRILLATOR IMPLANT N/A 12/03/2013  ?  Procedure: IMPLANTABLE CARDIOVERTER DEFIBRILLATOR IMPLANT;  Surgeon: Gardiner Rhyme,  MD;  Location: [facility] CATH LAB;  Service: Cardiovascular;  Laterality: N/A;  ? INTRAOPERATIVE TRANSESOPHAGEAL ECHOCARDIOGRAM N/A 02/05/2013  ?  Procedure: INTRAOPERATIVE TRANSESOPHAGEAL ECHOCARDIOGRAM;  Surgeon: [name], MD;  Location: [facility] OR;  Service: Open Heart Surgery;  Laterality: N/A;  ? LEFT ATRIAL APPENDAGE OCCLUSION N/A 01/26/2016  ?  Procedure: LEFT ATRIAL APPENDAGE OCCLUSION;  Surgeon: Hillis Range, MD;  Location: [facility] INVASIVE CV LAB;  Service: Cardiovascular;  Laterality: N/A;  ? LEFT HEART CATHETERIZATION WITH CORONARY ANGIOGRAM N/A 02/03/2013  ?  Procedure: LEFT HEART CATHETERIZATION WITH CORONARY ANGIOGRAM;  Surgeon: Iran Ouch, MD;  Location: [facility] CATH LAB;  Service: Cardiovascular;  Laterality: N/A;  ? LEFT HEART CATHETERIZATION WITH CORONARY/GRAFT ANGIOGRAM N/A 11/01/2013  ?  Procedure: LEFT HEART CATHETERIZATION WITH Isabel Caprice;  Surgeon: Peter M Swaziland, MD;  Location: [facility] CATH LAB;  Service: Cardiovascular;  Laterality: N/A;  ? MITRAL VALVE REPLACEMENT (MVR)/CORONARY ARTERY BYPASS GRAFTING (CABG) N/A 02/05/2013  ?  Procedure: MITRAL VALVE REPLACEMENT (MVR)/CORONARY ARTERY BYPASS GRAFTING (CABG);  Surgeon: [name], MD;  Location: [facility] OR;  Service: Open Heart Surgery;  Laterality: N/A;  x3 using right greater saphenous vein and left internal mammary.   ? PATENT FORAMEN OVALE CLOSURE N/A 02/05/2013  ?  Procedure: PATENT FORAMEN OVALE CLOSURE;  Surgeon: [name], MD;  Location: [facility] OR;  Service: Open  Heart Surgery;  Laterality: N/A;  ? stomach ulcer repair      ? TEE WITHOUT CARDIOVERSION N/A 02/04/2013  ?  Procedure: TRANSESOPHAGEAL ECHOCARDIOGRAM (TEE);  Surgeon: Vesta Mixer, MD;  Location: [facility] ENDOSCOPY;  Service: Cardiovascular;  Laterality: N/A;  ? TEE WITHOUT CARDIOVERSION N/A 12/05/2015  ?  Procedure: TRANSESOPHAGEAL ECHOCARDIOGRAM (TEE);  Surgeon: Lewayne Bunting, MD;  Location: [facility] ENDOSCOPY;  Service: Cardiovascular;  Laterality: N/A;  ? TEE WITHOUT  CARDIOVERSION N/A 03/15/2016  ?  Procedure: TRANSESOPHAGEAL ECHOCARDIOGRAM (TEE);  Surgeon: Chrystie Nose, MD;  Location: [facility] ENDOSCOPY;  Service: Cardiovascular;  Laterality: N/A;  ?  ?  ?ROS- all systems are reviewed and negative except as per HPI above ?  ?      ?Current Outpatient Medications  ?Medication Sig Dispense Refill  ? acetaminophen (TYLENOL) 500 MG tablet Take 1,000 mg by mouth daily as needed for mild pain.       ? aspirin 325 MG tablet Take 325 mg by mouth daily.      ? atorvastatin (LIPITOR) 80 MG tablet Take 1 tablet (80 mg total) by mouth daily. 90 tablet 3  ? bisoprolol (ZEBETA) 10 MG tablet Take 1 tablet (10 mg total) by mouth daily. 90 tablet 3  ? furosemide (LASIX) 40 MG tablet Take 1 tablet (40 mg total) by mouth daily. 90 tablet 3  ? hydrALAZINE (APRESOLINE) 50 MG tablet Take 1 tablet (50 mg total) by mouth 2 (two) times daily. 180 tablet 3  ? isosorbide mononitrate (IMDUR) 60 MG 24 hr tablet Take 1 tablet by mouth once daily 90 tablet 3  ? nitroGLYCERIN (NITROSTAT) 0.4 MG SL tablet Place 1 tablet (0.4 mg total) under the tongue every 5 (five) minutes as needed for chest pain. 25 tablet 3  ?  ?No current facility-administered medications for this visit.  ?  ?  ?Physical Exam: ?   ?Vitals:  ?  11/17/21 1528  ?BP: 118/70  ?Pulse: 66  ?SpO2: 93%  ?Weight: 225 lb (102.1 kg)  ?Height: 6' (1.829 m)  ?  ?  ?GEN- The patient is well appearing, alert and oriented x 3 today.   ?Head- normocephalic, atraumatic ?Eyes-  Sclera clear, conjunctiva pink ?Ears- hearing intact ?Oropharynx- clear ?Lungs- Clear to ausculation bilaterally, normal work of breathing ?Chest- ICD pocket is well healed ?Heart- Regular rate and rhythm, no murmurs, rubs or gallops, PMI not laterally displaced ?GI- soft, NT, ND, + BS ?Extremities- no clubbing, cyanosis, or edema ?  ?ICD interrogation- reviewed in detail today,  See PACEART report ?  ?  ?   ?Wt Readings from Last 3 Encounters:  ?11/17/21 225 lb (102.1 kg)  ?08/09/21 222  lb (100.7 kg)  ?11/11/20 225 lb 6.4 oz (102.2 kg)  ?  ?  ?Assessment and Plan: ?  ?1.  Chronic systolic dysfunction/ ischemic CM/ CAD ?euvolemic today ?No ischemic symptoms ?Update echo ?Stable on an appropriate medical regimen ?Normal ICD function ?Approaching ERI ?See Arita Miss Art report ?No changes today ?he is not device dependant today ?followed in ICM device clinic ?He V paces 100%.  Approaching ERI.  I would advise upgrade to CRT-D at time of generator change.  As I am working parttime now, I will schedule with Dr Ladona Ridgel. ?Risks, benefits, and alternatives to upgrade to CRT-D were discussed in detail today.  The patient understands that risks include but are not limited to bleeding, infection, pneumothorax, perforation, tamponade, vascular damage, renal failure, MI, stroke, death, inappropriate shocks, damage to his existing  leads, and lead dislodgement and wishes to proceed.  We will therefore schedule the procedure at the next available time with Dr Ladona Ridgelaylor. ?  ?  ?2. Prior VF arrest ?Arrhythmias are controlled ?  ?3. Afib ?Well controlled ?S/p watchman with prior SDH ?Hold ASA 7 days prior to the procedure. ?  ?4. HTN ?Stable ?No change required today ?  ?5. S/p MVR ?Stable ?No change required today ?  ?  ?Hillis RangeJames Allred MD, [facility] ?11/17/2021 ?3:47 PM ? ?EP  Attending ? ?Patient seen and examined. Agree with the findings as noted above. The patient is a pleasant 76 yo man with CHB, VF arrest, s/p ICD insertion, who has developed worsening LV dysfunction and CHF and his EF is now15%. He has pacing induced LBBB. I have discussed the treatment options with the patient and recommended proceeding with biv ICD upgrade. The risks/benefits/goals/expectations of the procedure were reviewed and he wishes to proceed. ? ?Dorathy DaftGregg Camilah Spillman,MD ?  ?

## 2021-12-13 NOTE — Discharge Instructions (Signed)
After Your ICD ?(Implantable Cardiac Defibrillator) ? ? ?You have a [employer] ICD ? ?ACTIVITY ?Do not lift your arm above shoulder height for 1 week after your procedure. After 7 days, you may progress as below.  ?You should remove your sling 24 hours after your procedure, unless otherwise instructed by your provider.  ? ? ? Wednesday December 20, 2021  Thursday December 21, 2021 Friday December 22, 2021 Saturday December 23, 2021  ? ?Do not lift, push, pull, or carry anything over 10 pounds with the affected arm until 6 weeks (Wednesday Jan 24, 2022 ) after your procedure.  ? ?You may drive AFTER your wound check, unless you have been told otherwise by your provider.  ? ?Ask your healthcare provider when you can go back to work ? ? ?INCISION/Dressing ?If you are on a blood thinner such as Coumadin, Xarelto, Eliquis, Plavix, or Pradaxa please confirm with your provider when this should be resumed.  ? ?If large square, outer bandage is left in place, this can be removed after 24 hours from your procedure. Do not remove steri-strips or glue as below.  ? ?Monitor your defibrillator site for redness, swelling, and drainage. Call the device clinic at [phone] if you experience these symptoms or fever/chills. ? ?If your incision is sealed with Steri-strips or staples, you may shower 10 days after your procedure or when told by your provider. Do not remove the steri-strips or let the shower hit directly on your site. You may wash around your site with soap and water.   ? ?If you were discharged in a sling, please do not wear this during the day more than 48 hours after your surgery unless otherwise instructed. This may increase the risk of stiffness and soreness in your shoulder.  ? ?Avoid lotions, ointments, or perfumes over your incision until it is well-healed. ? ?You may use a hot tub or a pool AFTER your wound check appointment if the incision is completely closed. ? ?Your ICD is designed to protect you from life threatening  heart rhythms. Because of this, you may receive a shock.  ? ?1 shock with no symptoms:  Call the office during business hours. ?1 shock with symptoms (chest pain, chest pressure, dizziness, lightheadedness, shortness of breath, overall feeling unwell):  Call 911. ?If you experience 2 or more shocks in 24 hours:  Call 911. ?If you receive a shock, you should not drive for 6 months per the [place] DMV IF you receive appropriate therapy from your ICD.  ? ?ICD Alerts:  Some alerts are vibratory and others beep. These are NOT emergencies. Please call our office to let us know. If this occurs at night or on weekends, it can wait until the next business day. Send a remote transmission. ? ?If your device is capable of reading fluid status (for heart failure), you will be offered monthly monitoring to review this with you.  ? ?DEVICE MANAGEMENT ?Remote monitoring is used to monitor your ICD from home. This monitoring is scheduled every 91 days by our office. It allows [place] to keep an eye on the functioning of your device to ensure it is working properly. You will routinely see your Electrophysiologist annually (more often if necessary).  ? ?You should receive your ID card for your new device in 4-8 weeks. Keep this card with you at all times once received. Consider wearing a medical alert bracelet or necklace. ? ?Your ICD  may be MRI compatible. This will be discussed at your  next office visit/wound check.  You should avoid contact with strong electric or magnetic fields.  ? ?Do not use amateur (ham) radio equipment or electric (arc) welding torches. MP3 player headphones with magnets should not be used. Some devices are safe to use if held at least 12 inches (30 cm) from your defibrillator. These include power tools, lawn mowers, and speakers. If you are unsure if something is safe to use, ask your health care provider. ? ?When using your cell phone, hold it to the ear that is on the opposite side from the defibrillator. Do not  leave your cell phone in a pocket over the defibrillator. ? ?You may safely use electric blankets, heating pads, computers, and microwave ovens. ? ?Call the office right away if: ?You have chest pain. ?You feel more than one shock. ?You feel more short of breath than you have felt before. ?You feel more light-headed than you have felt before. ?Your incision starts to open up. ? ?This information is not intended to replace advice given to you by your health care provider. Make sure you discuss any questions you have with your health care provider. ? ? ?  ?

## 2021-12-14 ENCOUNTER — Encounter (HOSPITAL_COMMUNITY): Payer: Self-pay | Admitting: Internal Medicine

## 2021-12-15 MED FILL — Lidocaine HCl Local Inj 1%: INTRAMUSCULAR | Qty: 20 | Status: AC

## 2021-12-27 ENCOUNTER — Ambulatory Visit: Payer: Medicare Other

## 2022-01-04 ENCOUNTER — Ambulatory Visit (INDEPENDENT_AMBULATORY_CARE_PROVIDER_SITE_OTHER): Payer: Medicare Other

## 2022-01-04 DIAGNOSIS — I519 Heart disease, unspecified: Secondary | ICD-10-CM | POA: Diagnosis not present

## 2022-01-04 LAB — CUP PACEART INCLINIC DEVICE CHECK
Battery Remaining Longevity: 76 mo
Brady Statistic RA Percent Paced: 88 %
Brady Statistic RV Percent Paced: 96 %
Date Time Interrogation Session: 20230427162038
HighPow Impedance: 58.5 Ohm
Implantable Lead Implant Date: 20150326
Implantable Lead Implant Date: 20150326
Implantable Lead Implant Date: 20230405
Implantable Lead Location: 753858
Implantable Lead Location: 753859
Implantable Lead Location: 753860
Implantable Pulse Generator Implant Date: 20230405
Lead Channel Impedance Value: 312.5 Ohm
Lead Channel Impedance Value: 350 Ohm
Lead Channel Impedance Value: 837.5 Ohm
Lead Channel Pacing Threshold Amplitude: 0.5 V
Lead Channel Pacing Threshold Amplitude: 0.5 V
Lead Channel Pacing Threshold Amplitude: 1 V
Lead Channel Pacing Threshold Amplitude: 1 V
Lead Channel Pacing Threshold Amplitude: 1 V
Lead Channel Pacing Threshold Amplitude: 1 V
Lead Channel Pacing Threshold Pulse Width: 0.5 ms
Lead Channel Pacing Threshold Pulse Width: 0.5 ms
Lead Channel Pacing Threshold Pulse Width: 0.5 ms
Lead Channel Pacing Threshold Pulse Width: 0.5 ms
Lead Channel Pacing Threshold Pulse Width: 0.5 ms
Lead Channel Pacing Threshold Pulse Width: 0.5 ms
Lead Channel Sensing Intrinsic Amplitude: 12 mV
Lead Channel Sensing Intrinsic Amplitude: 2.9 mV
Lead Channel Setting Pacing Amplitude: 1.5 V
Lead Channel Setting Pacing Amplitude: 2.125
Lead Channel Setting Pacing Amplitude: 3.5 V
Lead Channel Setting Pacing Pulse Width: 0.5 ms
Lead Channel Setting Pacing Pulse Width: 0.5 ms
Lead Channel Setting Sensing Sensitivity: 0.5 mV
Pulse Gen Serial Number: 210000168

## 2022-01-04 NOTE — Progress Notes (Signed)
Wound check appointment. Steri-strips removed. Wound without redness or edema. Incision edges approximated, wound well healed. Normal device function. Thresholds, sensing, and impedances consistent with implant measurements. LV output programmed at 3.5V.  Other leads programmed chronic outputs. Histogram distribution appropriate for patient and level of activity. 2 AMS episodes less than 1% AT/AF burden.  EGM's illustrate atrial tachycardia less than 10 seconds in duration. No ventricular arrhythmias noted. Patient educated about wound care, arm mobility, lifting restrictions, shock plan. ROV March 21, 2022 with GT in [place]. ?

## 2022-01-04 NOTE — Patient Instructions (Signed)
? ?  After Your ICD ?(Implantable Cardiac Defibrillator) ? ? ? ?Monitor your defibrillator site for redness, swelling, and drainage. Call the device clinic at [phone] if you experience these symptoms or fever/chills. ? ?Your incision was closed with Steri-strips or staples:  You may shower 7 days after your procedure and wash your incision with soap and water. Avoid lotions, ointments, or perfumes over your incision until it is well-healed. ? ? ?Do not lift, push or pull greater than 10 pounds with the affected arm until Jan 24 2022. There are no other restrictions in arm movement after your wound check appointment. ? ? ?Your ICD is designed to protect you from life threatening heart rhythms. Because of this, you may receive a shock.  ? ?1 shock with no symptoms:  Call the office during business hours. ?1 shock with symptoms (chest pain, chest pressure, dizziness, lightheadedness, shortness of breath, overall feeling unwell):  Call 911. ?If you experience 2 or more shocks in 24 hours:  Call 911. ?If you receive a shock, you should not drive.  ?[place] DMV - no driving for 6 months if you receive appropriate therapy from your ICD.  ? ?ICD Alerts:  Some alerts are vibratory and others beep. These are NOT emergencies. Please call our office to let us know. If this occurs at night or on weekends, it can wait until the next business day. Send a remote transmission. ? ?If your device is capable of reading fluid status (for heart failure), you will be offered monthly monitoring to review this with you.  ? ?Remote monitoring is used to monitor your ICD from home. This monitoring is scheduled every 91 days by our office. It allows [place] to keep an eye on the functioning of your device to ensure it is working properly. You will routinely see your Electrophysiologist annually (more often if necessary).  ?

## 2022-01-22 ENCOUNTER — Ambulatory Visit (INDEPENDENT_AMBULATORY_CARE_PROVIDER_SITE_OTHER): Payer: Medicare Other

## 2022-01-22 DIAGNOSIS — Z95810 Presence of automatic (implantable) cardiac defibrillator: Secondary | ICD-10-CM

## 2022-01-22 DIAGNOSIS — I5022 Chronic systolic (congestive) heart failure: Secondary | ICD-10-CM

## 2022-01-26 NOTE — Progress Notes (Signed)
EPIC Encounter for ICM Monitoring  Patient Name: Lonnie [name] is a 76 y.o. male Date: 01/26/2022 Primary Care Physican: Redmond School, MD Primary Cardiologist: Hochrein Electrophysiologist: Allred 11/17/2020 Office Weight: 225 lbs      AT/AF Burden: <1% (he has a watchman; hx of subdural hematoma)           Transmission reviewed.     Corvue thoracic impedance suggesting normal fluid levels.   Prescribed: Furosemide 40 mg 1 tablet daily. (Wife distributes his medications)   Labs: 12/05/2021 Creatinine 1.65, BUN 21, Potassium 3.7, Sodium 140. GFR 43 A complete set of results can be found in Results Review.   Recommendations:  No changes   Follow-up plan: ICM clinic phone appointment 02/26/2022.  91 day device clinic remote transmission 03/28/2022.     EP/Cardiology Office Visits:  03/20/2022 with Dr Lovena Le.   Copy of ICM check sent to Dr. Rayann Heman.      3 month ICM trend: 01/22/2022.    12-14 Month ICM trend:     Rosalene Billings, RN 01/26/2022 12:57 PM

## 2022-02-26 ENCOUNTER — Ambulatory Visit (INDEPENDENT_AMBULATORY_CARE_PROVIDER_SITE_OTHER): Payer: Medicare Other

## 2022-02-26 DIAGNOSIS — I5022 Chronic systolic (congestive) heart failure: Secondary | ICD-10-CM | POA: Diagnosis not present

## 2022-02-26 DIAGNOSIS — Z95810 Presence of automatic (implantable) cardiac defibrillator: Secondary | ICD-10-CM | POA: Diagnosis not present

## 2022-03-20 ENCOUNTER — Encounter: Payer: Medicare Other | Admitting: Internal Medicine

## 2022-03-21 ENCOUNTER — Encounter: Payer: Self-pay | Admitting: Internal Medicine

## 2022-03-21 ENCOUNTER — Ambulatory Visit (INDEPENDENT_AMBULATORY_CARE_PROVIDER_SITE_OTHER): Payer: Medicare Other | Admitting: Internal Medicine

## 2022-03-21 VITALS — BP 103/64 | HR 66 | Ht 73.0 in | Wt 222.4 lb

## 2022-03-21 DIAGNOSIS — I5022 Chronic systolic (congestive) heart failure: Secondary | ICD-10-CM | POA: Diagnosis not present

## 2022-03-21 NOTE — Patient Instructions (Signed)
Medication Instructions: Your physician recommends that you continue on your current medications as directed. Please refer to the Current Medication list given to you today.   Labwork: None  Testing/Procedures: None  Follow-Up: [facility]  Any Other Special Instructions Will Be Listed Below (If Applicable).     If you need a refill on your cardiac medications before your next appointment, please call your pharmacy.

## 2022-03-21 NOTE — Progress Notes (Signed)
HPI Lonnie [name] returns today for followup. He is a pleasant 76 yo man with a h/o chronic systolic heart failure, ICM, atrial fib, s/p watchman, remote MVR and CHB who underwent ICD gen change out and upgrade to a biv ICD about 3 months ago. In the interim, he notes he has done well. He is sedentary but is walking. No ICD therapies.  No Known Allergies   Current Outpatient Medications  Medication Sig Dispense Refill   acetaminophen (TYLENOL) 500 MG tablet Take 1,000 mg by mouth daily as needed for mild pain.      aspirin 325 MG tablet Take 325 mg by mouth daily.     atorvastatin (LIPITOR) 80 MG tablet Take 1 tablet (80 mg total) by mouth daily. 90 tablet 3   bisoprolol (ZEBETA) 10 MG tablet Take 1 tablet (10 mg total) by mouth daily. 90 tablet 3   furosemide (LASIX) 40 MG tablet Take 1 tablet (40 mg total) by mouth daily. 90 tablet 3   hydrALAZINE (APRESOLINE) 50 MG tablet Take 1 tablet (50 mg total) by mouth 2 (two) times daily. 180 tablet 3   isosorbide mononitrate (IMDUR) 60 MG 24 hr tablet Take 1 tablet by mouth once daily 90 tablet 3   nitroGLYCERIN (NITROSTAT) 0.4 MG SL tablet Place 1 tablet (0.4 mg total) under the tongue every 5 (five) minutes as needed for chest pain. 25 tablet 3   No current facility-administered medications for this visit.     Past Medical History:  Diagnosis Date   Atrial tachycardia (HCC)    CAD (coronary artery disease)    LAD 95% proximal stenosis, D1 50-60% stenosis, the circumflex 40% stenosis, RCA subtotal stenosis.   Cardiomyopathy, ischemic    EF was 30-35% by echo but 45-50% by cath.  Most recent EF 15%.     Complication of anesthesia    "Hard time waking me up" after sedation after dental procedure   Constipation    H/O cardiac arrest    High cholesterol    Hypertension    Mitral regurgitation    Secondary to papillary muscle rupture   Persistent atrial fibrillation (HCC)    sp Watchman implant   Reflux    Ventricular fibrillation  (HCC) 02/16/14   successfully defibrillation by his ICD    ROS:   All systems reviewed and negative except as noted in the HPI.   Past Surgical History:  Procedure Laterality Date   BIV UPGRADE N/A 12/13/2021   Procedure: BIVI ICD UPGRADE;  Surgeon: Marinus Maw, MD;  Location: [facility] INVASIVE CV LAB;  Service: Cardiovascular;  Laterality: N/A;   CARDIOVERSION N/A 01/25/2014   Procedure: CARDIOVERSION;  Surgeon: Wendall Stade, MD;  Location: [facility] ENDOSCOPY;  Service: Cardiovascular;  Laterality: N/A;   CORONARY ARTERY BYPASS GRAFT     ENDOVEIN HARVEST OF GREATER SAPHENOUS VEIN Right 02/05/2013   Procedure: ENDOVEIN HARVEST OF GREATER SAPHENOUS VEIN;  Surgeon: [name], MD;  Location: [facility] OR;  Service: Open Heart Surgery;  Laterality: Right;   IMPLANTABLE CARDIOVERTER DEFIBRILLATOR IMPLANT  12/03/2013   STJ Fortify ICD implanted for secondary prevention by Dr Johney Frame   IMPLANTABLE CARDIOVERTER DEFIBRILLATOR IMPLANT N/A 12/03/2013   Procedure: IMPLANTABLE CARDIOVERTER DEFIBRILLATOR IMPLANT;  Surgeon: Gardiner Rhyme, MD;  Location: [facility] CATH LAB;  Service: Cardiovascular;  Laterality: N/A;   INTRAOPERATIVE TRANSESOPHAGEAL ECHOCARDIOGRAM N/A 02/05/2013   Procedure: INTRAOPERATIVE TRANSESOPHAGEAL ECHOCARDIOGRAM;  Surgeon: [name], MD;  Location: [facility] OR;  Service: Open Heart  Surgery;  Laterality: N/A;   LEFT ATRIAL APPENDAGE OCCLUSION N/A 01/26/2016   Procedure: LEFT ATRIAL APPENDAGE OCCLUSION;  Surgeon: Hillis Range, MD;  Location: [facility] INVASIVE CV LAB;  Service: Cardiovascular;  Laterality: N/A;   LEFT HEART CATHETERIZATION WITH CORONARY ANGIOGRAM N/A 02/03/2013   Procedure: LEFT HEART CATHETERIZATION WITH CORONARY ANGIOGRAM;  Surgeon: Iran Ouch, MD;  Location: [facility] CATH LAB;  Service: Cardiovascular;  Laterality: N/A;   LEFT HEART CATHETERIZATION WITH CORONARY/GRAFT ANGIOGRAM N/A 11/01/2013   Procedure: LEFT HEART CATHETERIZATION WITH Isabel Caprice;  Surgeon: Peter M  Swaz[place]and, MD;  Location: [facility] CATH LAB;  Service: Cardiovascular;  Laterality: N/A;   MITRAL VALVE REPLACEMENT (MVR)/CORONARY ARTERY BYPASS GRAFTING (CABG) N/A 02/05/2013   Procedure: MITRAL VALVE REPLACEMENT (MVR)/CORONARY ARTERY BYPASS GRAFTING (CABG);  Surgeon: [name], MD;  Location: [facility] OR;  Service: Open Heart Surgery;  Laterality: N/A;  x3 using right greater saphenous vein and left internal mammary.    PATENT FORAMEN OVALE CLOSURE N/A 02/05/2013   Procedure: PATENT FORAMEN OVALE CLOSURE;  Surgeon: [name], MD;  Location: [facility] OR;  Service: Open Heart Surgery;  Laterality: N/A;   stomach ulcer repair     TEE WITHOUT CARDIOVERSION N/A 02/04/2013   Procedure: TRANSESOPHAGEAL ECHOCARDIOGRAM (TEE);  Surgeon: Vesta Mixer, MD;  Location: [facility] ENDOSCOPY;  Service: Cardiovascular;  Laterality: N/A;   TEE WITHOUT CARDIOVERSION N/A 12/05/2015   Procedure: TRANSESOPHAGEAL ECHOCARDIOGRAM (TEE);  Surgeon: Lewayne Bunting, MD;  Location: [facility] ENDOSCOPY;  Service: Cardiovascular;  Laterality: N/A;   TEE WITHOUT CARDIOVERSION N/A 03/15/2016   Procedure: TRANSESOPHAGEAL ECHOCARDIOGRAM (TEE);  Surgeon: Chrystie Nose, MD;  Location: [facility] ENDOSCOPY;  Service: Cardiovascular;  Laterality: N/A;     Family History  Problem Relation Age of Onset   Heart attack Mother 39   Heart attack Brother      Social History   Socioeconomic History   Marital status: Married    Spouse name: Not on file   Number of children: Not on file   Years of education: Not on file   Highest education level: Not on file  Occupational History   Not on file  Tobacco Use   Smoking status: Former    Types: Cigarettes    Quit date: 06/04/1974    Years since quitting: 47.8   Smokeless tobacco: Never   Tobacco comments:    40 yrs ago  Substance and Sexual Activity   Alcohol use: Yes    Alcohol/week: 0.0 standard drinks of alcohol    Comment: week end - 1/5 of crown- 9/25 ocassionally-AJ   Drug use: No    Sexual activity: Not on file  Other Topics Concern   Not on file  Social History Narrative   Drinks about five cups of caffeine a day.   Social Determinants of Health   Financial Resource Strain: Not on file  Food Insecurity: Not on file  Transportation Needs: Not on file  Physical Activity: Not on file  Stress: Not on file  Social Connections: Not on file  Intimate Partner Violence: Not on file     BP 103/64   Pulse 66   Ht 6\' 1"  (1.854 m)   Wt 222 lb 6.4 oz (100.9 kg)   SpO2 96%   BMI 29.34 kg/m   Physical Exam:  Well appearing NAD HEENT: Unremarkable Neck:  No JVD, no thyromegally Lymphatics:  No adenopathy Back:  No CVA tenderness Lungs:  Clear with no wheezes HEART:  Regular rate rhythm, no murmurs, no  rubs, no clicks Abd:  soft, positive bowel sounds, no organomegally, no rebound, no guarding Ext:  2 plus pulses, no edema, no cyanosis, no clubbing Skin:  No rashes no nodules Neuro:  CN II through XII intact, motor grossly intact   DEVICE  Normal device function.  See PaceArt for details.   Assess/Plan:  ICD - he is s/p biv ICD upgrade and his LV threshold is 1 at 0.5. He has had his output turned down to 2 at 0.5 Chronic systolic heart failure - his symptoms are class 2. He will continue his current meds. HTN - his bp is well controlled. VT -He has not had any additional therapies. No change in his meds.  Sharlot Gowda Alexxa Sabet,MD

## 2022-03-27 ENCOUNTER — Ambulatory Visit (INDEPENDENT_AMBULATORY_CARE_PROVIDER_SITE_OTHER): Payer: Medicare Other

## 2022-03-27 DIAGNOSIS — I420 Dilated cardiomyopathy: Secondary | ICD-10-CM | POA: Diagnosis not present

## 2022-03-27 LAB — CUP PACEART REMOTE DEVICE CHECK
Battery Remaining Longevity: 88 mo
Battery Remaining Percentage: 94 %
Battery Voltage: 3.02 V
Brady Statistic AP VP Percent: 87 %
Brady Statistic AP VS Percent: 1 %
Brady Statistic AS VP Percent: 10 %
Brady Statistic AS VS Percent: 1.6 %
Brady Statistic RA Percent Paced: 87 %
Date Time Interrogation Session: 20230718053531
HighPow Impedance: 63 Ohm
Implantable Lead Implant Date: 20150326
Implantable Lead Implant Date: 20150326
Implantable Lead Implant Date: 20230405
Implantable Lead Location: 753858
Implantable Lead Location: 753859
Implantable Lead Location: 753860
Implantable Pulse Generator Implant Date: 20230405
Lead Channel Impedance Value: 1150 Ohm
Lead Channel Impedance Value: 310 Ohm
Lead Channel Impedance Value: 350 Ohm
Lead Channel Pacing Threshold Amplitude: 0.5 V
Lead Channel Pacing Threshold Amplitude: 1 V
Lead Channel Pacing Threshold Amplitude: 1.125 V
Lead Channel Pacing Threshold Pulse Width: 0.5 ms
Lead Channel Pacing Threshold Pulse Width: 0.5 ms
Lead Channel Pacing Threshold Pulse Width: 0.5 ms
Lead Channel Sensing Intrinsic Amplitude: 11.2 mV
Lead Channel Sensing Intrinsic Amplitude: 2.9 mV
Lead Channel Setting Pacing Amplitude: 1.5 V
Lead Channel Setting Pacing Amplitude: 2 V
Lead Channel Setting Pacing Amplitude: 2.125
Lead Channel Setting Pacing Pulse Width: 0.5 ms
Lead Channel Setting Pacing Pulse Width: 0.5 ms
Lead Channel Setting Sensing Sensitivity: 0.5 mV
Pulse Gen Serial Number: 210000168

## 2022-04-09 ENCOUNTER — Ambulatory Visit (INDEPENDENT_AMBULATORY_CARE_PROVIDER_SITE_OTHER): Payer: Medicare Other

## 2022-04-09 DIAGNOSIS — Z95810 Presence of automatic (implantable) cardiac defibrillator: Secondary | ICD-10-CM | POA: Diagnosis not present

## 2022-04-09 DIAGNOSIS — I5022 Chronic systolic (congestive) heart failure: Secondary | ICD-10-CM | POA: Diagnosis not present

## 2022-04-12 NOTE — Progress Notes (Signed)
EPIC Encounter for ICM Monitoring  Patient Name: Lonnie [name] is a 76 y.o. male Date: 04/12/2022 Primary Care Physican: Elfredia Nevins, MD Primary Cardiologist: Hochrein Electrophysiologist: Allred 11/17/2020 Office Weight: 225 lbs      AT/AF Burden: <1% (he has a watchman; hx of subdural hematoma)           Transmission reviewed.     Corvue thoracic impedance suggesting normal fluid levels.   Prescribed: Furosemide 40 mg 1 tablet daily. (Wife distributes his medications)   Labs: 12/05/2021 Creatinine 1.65, BUN 21, Potassium 3.7, Sodium 140. GFR 43 A complete set of results can be found in Results Review.   Recommendations:  No changes   Follow-up plan: ICM clinic phone appointment 05/15/2022.  91 day device clinic remote transmission 06/26/2022.     EP/Cardiology Office Visits:  Recall 08/04/2022 with Dr Antoine Poche.  Recall 03/21/2023 with Dr Johney Frame.   Copy of ICM check sent to Dr. Johney Frame.       3 month ICM trend: 04/09/2022.    12-14 Month ICM trend:     [name], RN 04/12/2022 3:57 PM

## 2022-04-24 NOTE — Progress Notes (Signed)
Remote ICD transmission.   

## 2022-05-15 ENCOUNTER — Ambulatory Visit (INDEPENDENT_AMBULATORY_CARE_PROVIDER_SITE_OTHER): Payer: Medicare Other

## 2022-05-15 DIAGNOSIS — I5022 Chronic systolic (congestive) heart failure: Secondary | ICD-10-CM | POA: Diagnosis not present

## 2022-05-15 DIAGNOSIS — Z95810 Presence of automatic (implantable) cardiac defibrillator: Secondary | ICD-10-CM | POA: Diagnosis not present

## 2022-05-17 NOTE — Progress Notes (Signed)
EPIC Encounter for ICM Monitoring  Patient Name: Lonnie [name] is a 76 y.o. male Date: 05/17/2022 Primary Care Physican: Lonnie Nevins, MD Primary Cardiologist: Lonnie [name] Electrophysiologist: Lonnie [name] 03/21/2022 Office Weight: 222 lbs      AT/AF Burden: <1% (he has a watchman; hx of subdural hematoma)           Transmission reviewed.     Corvue thoracic impedance suggesting normal fluid levels.   Prescribed: Furosemide 40 mg 1 tablet daily. (Wife distributes his medications)   Labs: 12/05/2021 Creatinine 1.65, BUN 21, Potassium 3.7, Sodium 140. GFR 43 A complete set of results can be found in Results Review.   Recommendations:  No changes   Follow-up plan: ICM clinic phone appointment 06/25/2022.  91 day device clinic remote transmission 06/26/2022.     EP/Cardiology Office Visits:  Recall 08/04/2022 with Dr Lonnie [name].  Recall 03/21/2023 with Dr Lonnie [name].   Copy of ICM check sent to Dr. Nelly [name].       3 month ICM trend: 05/15/2022.    12-14 Month ICM trend:     [name], RN 05/17/2022 8:43 AM

## 2022-06-25 ENCOUNTER — Ambulatory Visit (INDEPENDENT_AMBULATORY_CARE_PROVIDER_SITE_OTHER): Payer: Medicare Other

## 2022-06-25 DIAGNOSIS — I5022 Chronic systolic (congestive) heart failure: Secondary | ICD-10-CM

## 2022-06-25 DIAGNOSIS — Z95810 Presence of automatic (implantable) cardiac defibrillator: Secondary | ICD-10-CM

## 2022-06-26 ENCOUNTER — Ambulatory Visit (INDEPENDENT_AMBULATORY_CARE_PROVIDER_SITE_OTHER): Payer: Medicare Other

## 2022-06-26 DIAGNOSIS — I420 Dilated cardiomyopathy: Secondary | ICD-10-CM | POA: Diagnosis not present

## 2022-06-29 LAB — CUP PACEART REMOTE DEVICE CHECK
Battery Remaining Longevity: 85 mo
Battery Remaining Percentage: 91 %
Battery Voltage: 3.01 V
Brady Statistic AP VP Percent: 86 %
Brady Statistic AP VS Percent: 1 %
Brady Statistic AS VP Percent: 10 %
Brady Statistic AS VS Percent: 2.4 %
Brady Statistic RA Percent Paced: 87 %
Date Time Interrogation Session: 20231019215303
HighPow Impedance: 63 Ohm
Implantable Lead Implant Date: 20150326
Implantable Lead Implant Date: 20150326
Implantable Lead Implant Date: 20230405
Implantable Lead Location: 753858
Implantable Lead Location: 753859
Implantable Lead Location: 753860
Implantable Pulse Generator Implant Date: 20230405
Lead Channel Impedance Value: 300 Ohm
Lead Channel Impedance Value: 350 Ohm
Lead Channel Impedance Value: 830 Ohm
Lead Channel Pacing Threshold Amplitude: 0.5 V
Lead Channel Pacing Threshold Amplitude: 1 V
Lead Channel Pacing Threshold Amplitude: 1.125 V
Lead Channel Pacing Threshold Pulse Width: 0.5 ms
Lead Channel Pacing Threshold Pulse Width: 0.5 ms
Lead Channel Pacing Threshold Pulse Width: 0.5 ms
Lead Channel Sensing Intrinsic Amplitude: 1.5 mV
Lead Channel Sensing Intrinsic Amplitude: 10.8 mV
Lead Channel Setting Pacing Amplitude: 1.5 V
Lead Channel Setting Pacing Amplitude: 2 V
Lead Channel Setting Pacing Amplitude: 2.125
Lead Channel Setting Pacing Pulse Width: 0.5 ms
Lead Channel Setting Pacing Pulse Width: 0.5 ms
Lead Channel Setting Sensing Sensitivity: 0.5 mV
Pulse Gen Serial Number: 210000168

## 2022-07-03 NOTE — Progress Notes (Signed)
EPIC Encounter for ICM Monitoring  Patient Name: Lonnie [name] is a 76 y.o. male Date: 07/03/2022 Primary Care Physican: Redmond School, MD Primary Cardiologist: Hochrein Electrophysiologist: Mealor 03/21/2022 Office Weight: 222 lbs      AT/AF Burden: <1% (he has a watchman; hx of subdural hematoma)           Transmission reviewed.     Corvue thoracic impedance suggesting normal fluid levels.   Prescribed: Furosemide 40 mg 1 tablet daily. (Wife distributes his medications)   Labs: 12/05/2021 Creatinine 1.65, BUN 21, Potassium 3.7, Sodium 140. GFR 43 A complete set of results can be found in Results Review.   Recommendations:  No changes   Follow-up plan: ICM clinic phone appointment 07/30/2022.  91 day device clinic remote transmission 09/25/2022.     EP/Cardiology Office Visits:  Recall 08/04/2022 with Dr Percival Spanish.  Recall 03/21/2023 with Dr Myles Gip.   Copy of ICM check sent to Dr. Myles Gip.       3 month ICM trend: 06/25/2022.    3 month ICM trend: 07/02/2022.   12-14 Month ICM trend:     Rosalene Billings, RN 07/03/2022 10:09 AM

## 2022-07-11 NOTE — Progress Notes (Signed)
Remote ICD transmission.   

## 2022-07-25 ENCOUNTER — Other Ambulatory Visit: Payer: Self-pay | Admitting: Cardiology

## 2022-07-30 ENCOUNTER — Ambulatory Visit (INDEPENDENT_AMBULATORY_CARE_PROVIDER_SITE_OTHER): Payer: Medicare Other

## 2022-07-30 DIAGNOSIS — I5022 Chronic systolic (congestive) heart failure: Secondary | ICD-10-CM

## 2022-07-30 DIAGNOSIS — Z95810 Presence of automatic (implantable) cardiac defibrillator: Secondary | ICD-10-CM | POA: Diagnosis not present

## 2022-07-31 ENCOUNTER — Telehealth: Payer: Self-pay

## 2022-07-31 NOTE — Telephone Encounter (Signed)
Remote ICM transmission received.  Attempted call to patient regarding ICM remote transmission and no answer.  Mail box is full. 

## 2022-07-31 NOTE — Progress Notes (Signed)
EPIC Encounter for ICM Monitoring  Patient Name: Lonnie [name] is a 76 y.o. male Date: 07/31/2022 Primary Care Physican: Elfredia Nevins, MD Primary Cardiologist: Hochrein Electrophysiologist: Mealor 03/21/2022 Office Weight: 222 lbs      AT/AF Burden: <1% (he has a watchman; hx of subdural hematoma)           Attempted call to patient and unable to reach.   Transmission reviewed.    Corvue thoracic impedance suggesting possible fluid accumulation starting 11/16 but trending close to baseline.   Prescribed: Furosemide 40 mg 1 tablet daily. (Wife distributes his medications)   Labs: 12/05/2021 Creatinine 1.65, BUN 21, Potassium 3.7, Sodium 140. GFR 43 A complete set of results can be found in Results Review.   Recommendations: Unable to reach.     Follow-up plan: ICM clinic phone appointment 08/06/2022 to recheck fluid levels.  91 day device clinic remote transmission 09/25/2022.     EP/Cardiology Office Visits:  Recall 08/04/2022 with Dr Antoine Poche.  Recall 03/21/2023 with Dr Nelly Laurence.   Copy of ICM check sent to Dr. Nelly Laurence.    Will send to Dr Antoine Poche for review if patient is reached.    3 month ICM trend: 07/30/2022.    12-14 Month ICM trend:     [name], RN 07/31/2022 8:31 AM

## 2022-08-06 ENCOUNTER — Ambulatory Visit (INDEPENDENT_AMBULATORY_CARE_PROVIDER_SITE_OTHER): Payer: Medicare Other

## 2022-08-06 DIAGNOSIS — Z95810 Presence of automatic (implantable) cardiac defibrillator: Secondary | ICD-10-CM

## 2022-08-06 DIAGNOSIS — I5022 Chronic systolic (congestive) heart failure: Secondary | ICD-10-CM

## 2022-08-10 NOTE — Progress Notes (Signed)
EPIC Encounter for ICM Monitoring  Patient Name: Lonnie [name] is a 76 y.o. male Date: 08/10/2022 Primary Care Physican: Elfredia Nevins, MD Primary Cardiologist: Hochrein Electrophysiologist: Mealor 03/21/2022 Office Weight: 222 lbs      AT/AF Burden: <1% (he has a watchman; hx of subdural hematoma)           Transmission reviewed.    Corvue thoracic impedance suggesting fluid levels returned to normal.   Prescribed: Furosemide 40 mg 1 tablet daily. (Wife distributes his medications)   Labs: 12/05/2021 Creatinine 1.65, BUN 21, Potassium 3.7, Sodium 140. GFR 43 A complete set of results can be found in Results Review.   Recommendations: Unable to reach.     Follow-up plan: ICM clinic phone appointment 09/04/2022.  91 day device clinic remote transmission 09/25/2022.     EP/Cardiology Office Visits:  Recall 08/04/2022 with Dr Antoine Poche.  Recall 03/21/2023 with Dr Nelly Laurence.   Copy of ICM check sent to Dr. Nelly Laurence.   3 month ICM trend: 08/06/2022.    12-14 Month ICM trend:     [name], RN 08/10/2022 1:05 PM

## 2022-09-04 ENCOUNTER — Ambulatory Visit (INDEPENDENT_AMBULATORY_CARE_PROVIDER_SITE_OTHER): Payer: Medicare Other

## 2022-09-04 DIAGNOSIS — Z95810 Presence of automatic (implantable) cardiac defibrillator: Secondary | ICD-10-CM

## 2022-09-04 DIAGNOSIS — I5022 Chronic systolic (congestive) heart failure: Secondary | ICD-10-CM

## 2022-09-05 NOTE — Progress Notes (Signed)
EPIC Encounter for ICM Monitoring  Patient Name: Lonnie [name] is a 76 y.o. male Date: 09/05/2022 Primary Care Physican: Elfredia Nevins, MD Primary Cardiologist: Hochrein Electrophysiologist: Mealor BiV Pacing: 96% 03/21/2022 Office Weight: 222 lbs      AT/AF Burden: <1% (he has a watchman; hx of subdural hematoma)           Transmission reviewed.    Corvue thoracic impedance suggesting normal fluid levels.   Prescribed: Furosemide 40 mg 1 tablet daily. (Wife distributes his medications)   Labs: 12/05/2021 Creatinine 1.65, BUN 21, Potassium 3.7, Sodium 140. GFR 43 A complete set of results can be found in Results Review.   Recommendations: No changes.    Follow-up plan: ICM clinic phone appointment 10/08/2022.  91 day device clinic remote transmission 09/25/2022.     EP/Cardiology Office Visits:  Recall 08/04/2022 with Dr Antoine Poche.  Recall 03/21/2023 with Dr Nelly Laurence.   Copy of ICM check sent to Dr. Nelly Laurence.   3 month ICM trend: 09/04/2022.    12-14 Month ICM trend:     [name], RN 09/05/2022 10:47 AM

## 2022-09-25 ENCOUNTER — Ambulatory Visit: Payer: Medicare Other | Attending: Cardiovascular Disease

## 2022-09-25 DIAGNOSIS — I420 Dilated cardiomyopathy: Secondary | ICD-10-CM | POA: Diagnosis not present

## 2022-09-25 LAB — CUP PACEART REMOTE DEVICE CHECK
Battery Remaining Longevity: 83 mo
Battery Remaining Percentage: 88 %
Battery Voltage: 2.99 V
Brady Statistic AP VP Percent: 82 %
Brady Statistic AP VS Percent: 1 %
Brady Statistic AS VP Percent: 14 %
Brady Statistic AS VS Percent: 3 %
Brady Statistic RA Percent Paced: 82 %
Date Time Interrogation Session: 20240116021844
HighPow Impedance: 69 Ohm
Implantable Lead Connection Status: 753985
Implantable Lead Connection Status: 753985
Implantable Lead Connection Status: 753985
Implantable Lead Implant Date: 20150326
Implantable Lead Implant Date: 20150326
Implantable Lead Implant Date: 20230405
Implantable Lead Location: 753858
Implantable Lead Location: 753859
Implantable Lead Location: 753860
Implantable Pulse Generator Implant Date: 20230405
Lead Channel Impedance Value: 1150 Ohm
Lead Channel Impedance Value: 330 Ohm
Lead Channel Impedance Value: 360 Ohm
Lead Channel Pacing Threshold Amplitude: 0.5 V
Lead Channel Pacing Threshold Amplitude: 0.875 V
Lead Channel Pacing Threshold Amplitude: 1 V
Lead Channel Pacing Threshold Pulse Width: 0.5 ms
Lead Channel Pacing Threshold Pulse Width: 0.5 ms
Lead Channel Pacing Threshold Pulse Width: 0.5 ms
Lead Channel Sensing Intrinsic Amplitude: 10.3 mV
Lead Channel Sensing Intrinsic Amplitude: 2.5 mV
Lead Channel Setting Pacing Amplitude: 1.5 V
Lead Channel Setting Pacing Amplitude: 1.875
Lead Channel Setting Pacing Amplitude: 2 V
Lead Channel Setting Pacing Pulse Width: 0.5 ms
Lead Channel Setting Pacing Pulse Width: 0.5 ms
Lead Channel Setting Sensing Sensitivity: 0.5 mV
Pulse Gen Serial Number: 210000168

## 2022-10-08 ENCOUNTER — Ambulatory Visit: Payer: Medicare Other | Attending: Cardiovascular Disease

## 2022-10-08 DIAGNOSIS — I5022 Chronic systolic (congestive) heart failure: Secondary | ICD-10-CM | POA: Diagnosis not present

## 2022-10-08 DIAGNOSIS — Z95810 Presence of automatic (implantable) cardiac defibrillator: Secondary | ICD-10-CM | POA: Diagnosis not present

## 2022-10-12 NOTE — Progress Notes (Signed)
EPIC Encounter for ICM Monitoring  Patient Name: Lonnie [name] is a 77 y.o. male Date: 10/12/2022 Primary Care Physican: Redmond School, MD Primary Cardiologist: Hochrein Electrophysiologist: Mealor BiV Pacing: 96% 03/21/2022 Office Weight: 222 lbs      AT/AF Burden: <1% (he has a watchman; hx of subdural hematoma)           Transmission reviewed.    Corvue thoracic impedance suggesting normal fluid levels.   Prescribed: Furosemide 40 mg 1 tablet daily. (Wife distributes his medications)   Labs: 12/05/2021 Creatinine 1.65, BUN 21, Potassium 3.7, Sodium 140. GFR 43 A complete set of results can be found in Results Review.   Recommendations: No changes.    Follow-up plan: ICM clinic phone appointment 11/12/2022.  91 day device clinic remote transmission 12/25/2022.     EP/Cardiology Office Visits:  11/21/2022 with Dr Percival Spanish.  Recall 03/21/2023 with Dr Myles Gip.   Copy of ICM check sent to Dr. Myles Gip.   3 month ICM trend: 10/08/2022.    12-14 Month ICM trend:     Rosalene Billings, RN 10/12/2022 12:41 PM

## 2022-10-19 NOTE — Progress Notes (Signed)
Remote ICD transmission.   

## 2022-11-12 ENCOUNTER — Ambulatory Visit: Payer: Medicare Other | Attending: Cardiovascular Disease

## 2022-11-12 DIAGNOSIS — Z95810 Presence of automatic (implantable) cardiac defibrillator: Secondary | ICD-10-CM

## 2022-11-12 DIAGNOSIS — I5022 Chronic systolic (congestive) heart failure: Secondary | ICD-10-CM | POA: Diagnosis not present

## 2022-11-15 NOTE — Progress Notes (Signed)
EPIC Encounter for ICM Monitoring  Patient Name: Lonnie [name] is a 77 y.o. male Date: 11/15/2022 Primary Care Physican: Redmond School, MD Primary Cardiologist: Hochrein Electrophysiologist: Mealor BiV Pacing: 97% 03/21/2022 Office Weight: 222 lbs      AT/AF Burden: <1% (he has a watchman; hx of subdural hematoma)           Transmission reviewed.    Corvue thoracic impedance suggesting normal fluid levels.   Prescribed: Furosemide 40 mg 1 tablet daily. (Wife distributes his medications)   Labs: 12/05/2021 Creatinine 1.65, BUN 21, Potassium 3.7, Sodium 140. GFR 43 A complete set of results can be found in Results Review.   Recommendations: No changes.    Follow-up plan: ICM clinic phone appointment 12/17/2022.  91 day device clinic remote transmission 12/25/2022.     EP/Cardiology Office Visits:  11/21/2022 with Dr Percival Spanish.  Recall 03/21/2023 with Dr Myles Gip.   Copy of ICM check sent to Dr. Myles Gip.   3 month ICM trend: 11/12/2022.    12-14 Month ICM trend:     Rosalene Billings, RN 11/15/2022 10:08 AM

## 2022-11-20 NOTE — Progress Notes (Addendum)
Cardiology Office Note   Date:  11/21/2022   ID:  TYBERIOUS TWETEN, DOB 1945-09-29, MRN [id number]  PCP:  Redmond School, MD  Cardiologist:   Minus Breeding, MD   Chief Complaint  Patient presents with   Cardiomyopathy      History of Present Illness: Lonnie [name] is a 77 y.o. male who presents for evaluation after CABG and mitral valve replacement. He presented late after myocardial infarction. 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 He ultimately required CABG with bioprosthetic mitral valve replacement. He had a resultant EF of 30-35%.   He suffered cardiac arrest on 2/22 with VT/VF.  Follow up cath demonstrated patent bypass grafts.  He initially went home with a Life Vest.  He subsequently returned for ICD placement on 12/03/13.  In November of that same year his ICD fired while he was at work. Prior to that he had a syncopal episode and suffered a subdural hematoma. He has recovered from this and now is status post Watchman.  His last EF is 25%.   At one point  he was noted to be in NSR.  He had been in what was thought to be chronic atrial fib.      Since I last saw him he has had upgrade to a BiV device.  I reviewed the most recent interrogation for this visit.  He is staying in sinus rhythm.  He feels okay.  It sounds like he is relatively sedentary.  He denies any chest pressure, neck or arm discomfort.  He has had no new shortness of breath, PND or orthopnea.  He has had no weight gain or edema.  Past Medical History:  Diagnosis Date   Atrial tachycardia    CAD (coronary artery disease)    LAD 95% proximal stenosis, D1 50-60% stenosis, the circumflex 40% stenosis, RCA subtotal stenosis.   Cardiomyopathy, ischemic    EF was 30-35% by echo but 45-50% by cath.  Most recent EF 15%.     Complication of anesthesia    "Hard time waking me up" after sedation after dental procedure   Constipation    H/O cardiac arrest    High cholesterol     Hypertension    Mitral regurgitation    Secondary to papillary muscle rupture   Persistent atrial fibrillation (HCC)    sp Watchman implant   Reflux    Ventricular fibrillation ([place]) 02/16/14   successfully defibrillation by his ICD    Past Surgical History:  Procedure Laterality Date   BIV UPGRADE N/A 12/13/2021   Procedure: BIVI ICD UPGRADE;  Surgeon: Evans Lance, MD;  Location: [place] [facility];  Service: Cardiovascular;  Laterality: N/A;   CARDIOVERSION N/A 01/25/2014   Procedure: CARDIOVERSION;  Surgeon: Josue Hector, MD;  Location: [place];  Service: Cardiovascular;  Laterality: N/A;   CORONARY ARTERY BYPASS GRAFT     ENDOVEIN HARVEST OF GREATER SAPHENOUS VEIN Right 02/05/2013   Procedure: ENDOVEIN HARVEST OF GREATER SAPHENOUS VEIN;  Surgeon: Melrose Nakayama, MD;  Location: [place];  Service: Open Heart Surgery;  Laterality: Right;   IMPLANTABLE CARDIOVERTER DEFIBRILLATOR IMPLANT  12/03/2013   STJ Fortify ICD implanted for secondary prevention by Dr Rayann Heman   IMPLANTABLE CARDIOVERTER DEFIBRILLATOR IMPLANT N/A 12/03/2013   Procedure: IMPLANTABLE CARDIOVERTER DEFIBRILLATOR IMPLANT;  Surgeon: Coralyn Mark, MD;  Location: [facility] CATH LAB;  Service: Cardiovascular;  Laterality: N/A;   INTRAOPERATIVE TRANSESOPHAGEAL ECHOCARDIOGRAM N/A 02/05/2013   Procedure: INTRAOPERATIVE TRANSESOPHAGEAL ECHOCARDIOGRAM;  Surgeon: Melrose Nakayama, MD;  Location: [place];  Service: Open Heart Surgery;  Laterality: N/A;   LEFT ATRIAL APPENDAGE OCCLUSION N/A 01/26/2016   Procedure: LEFT ATRIAL APPENDAGE OCCLUSION;  Surgeon: Thompson Grayer, MD;  Location: [place] [facility];  Service: Cardiovascular;  Laterality: N/A;   LEFT HEART CATHETERIZATION WITH CORONARY ANGIOGRAM N/A 02/03/2013   Procedure: LEFT HEART CATHETERIZATION WITH CORONARY ANGIOGRAM;  Surgeon: Wellington Hampshire, MD;  Location: [place] CATH LAB;  Service: Cardiovascular;  Laterality: N/A;   LEFT HEART CATHETERIZATION WITH CORONARY/GRAFT ANGIOGRAM  N/A 11/01/2013   Procedure: LEFT HEART CATHETERIZATION WITH Beatrix Fetters;  Surgeon: Peter M Martinique, MD;  Location: [facility] CATH LAB;  Service: Cardiovascular;  Laterality: N/A;   MITRAL VALVE REPLACEMENT (MVR)/CORONARY ARTERY BYPASS GRAFTING (CABG) N/A 02/05/2013   Procedure: MITRAL VALVE REPLACEMENT (MVR)/CORONARY ARTERY BYPASS GRAFTING (CABG);  Surgeon: Melrose Nakayama, MD;  Location: [place];  Service: Open Heart Surgery;  Laterality: N/A;  x3 using right greater saphenous vein and left internal mammary.    PATENT FORAMEN OVALE CLOSURE N/A 02/05/2013   Procedure: PATENT FORAMEN OVALE CLOSURE;  Surgeon: Melrose Nakayama, MD;  Location: [place];  Service: Open Heart Surgery;  Laterality: N/A;   stomach ulcer repair     TEE WITHOUT CARDIOVERSION N/A 02/04/2013   Procedure: TRANSESOPHAGEAL ECHOCARDIOGRAM (TEE);  Surgeon: Thayer Headings, MD;  Location: [place];  Service: Cardiovascular;  Laterality: N/A;   TEE WITHOUT CARDIOVERSION N/A 12/05/2015   Procedure: TRANSESOPHAGEAL ECHOCARDIOGRAM (TEE);  Surgeon: Lelon Perla, MD;  Location: [facility] ENDOSCOPY;  Service: Cardiovascular;  Laterality: N/A;   TEE WITHOUT CARDIOVERSION N/A 03/15/2016   Procedure: TRANSESOPHAGEAL ECHOCARDIOGRAM (TEE);  Surgeon: Pixie Casino, MD;  Location: [facility] ENDOSCOPY;  Service: Cardiovascular;  Laterality: N/A;     Current Outpatient Medications  Medication Sig Dispense Refill   acetaminophen (TYLENOL) 500 MG tablet Take 1,000 mg by mouth daily as needed for mild pain.      aspirin 325 MG tablet Take 325 mg by mouth daily.     atorvastatin (LIPITOR) 80 MG tablet Take 1 tablet by mouth once daily 90 tablet 3   bisoprolol (ZEBETA) 10 MG tablet Take 1 tablet by mouth once daily 90 tablet 3   furosemide (LASIX) 40 MG tablet Take 1 tablet by mouth once daily 90 tablet 3   hydrALAZINE (APRESOLINE) 50 MG tablet Take 1 tablet by mouth twice daily 180 tablet 3   isosorbide mononitrate (IMDUR) 60 MG 24 hr tablet Take 1  tablet by mouth once daily 90 tablet 3   nitroGLYCERIN (NITROSTAT) 0.4 MG SL tablet Place 1 tablet (0.4 mg total) under the tongue every 5 (five) minutes as needed for chest pain. 25 tablet 3   No current facility-administered medications for this visit.    Allergies:   Patient has no known allergies.    ROS:  Please see the history of present illness.   Otherwise, review of systems are positive for none.   All other systems are reviewed and negative.    PHYSICAL EXAM: VS:  BP 110/72   Pulse 64   Ht '6\' 1"'$  (1.854 m)   Wt 226 lb (102.5 kg)   BMI 29.82 kg/m  , BMI Body mass index is 29.82 kg/m. GENERAL:  Well appearing NECK:  No jugular venous distention, waveform within normal limits, carotid upstroke brisk and symmetric, no bruits, no thyromegaly LUNGS:  Clear to auscultation bilaterally CHEST: Well-healed pacemaker scar HEART:  PMI not displaced or sustained,S1 and  S2 within normal limits, no S3, no S4, no clicks, no rubs, no murmurs ABD:  Flat, positive bowel sounds normal in frequency in pitch, no bruits, no rebound, no guarding, no midline pulsatile mass, no hepatomegaly, no splenomegaly EXT:  2 plus pulses throughout, mild bilateral lower extremity edema, no cyanosis no clubbing   EKG:  EKG is    ordered today. The ekg ordered  demonstrates atrioventricular ventricular paced rhythm 100% capture, rate 64   Recent Labs: 12/05/2021: BUN 21; Creatinine, Ser 1.65; Hemoglobin 11.8; Platelets 240; Potassium 3.7; Sodium 140    Lipid Panel    Component Value Date/Time   CHOL 124 09/06/2021 1339   TRIG 75 09/06/2021 1339   HDL 51 09/06/2021 1339   CHOLHDL 2.4 09/06/2021 1339   CHOLHDL 2.8 11/11/2020 1014   VLDL 18 04/12/2014 1014   LDLCALC 58 09/06/2021 1339   LDLCALC 60 11/11/2020 1014      Wt Readings from Last 3 Encounters:  11/21/22 226 lb (102.5 kg)  03/21/22 222 lb 6.4 oz (100.9 kg)  12/13/21 215 lb (97.5 kg)      Other studies Reviewed: Additional studies/  records that were reviewed today include: Labs. Review of the above records demonstrates: See elsewhere   ASSESSMENT AND PLAN:  CARDIOMYOPATHY:    EF was 25 - 30% in 2019.     We are avoiding ARB's and ARNI secondary to CKD.  Blood pressure has not tolerated med titration.  I might be able to do this in the future if his blood pressure remains okay.  I am going to check another echocardiogram.   MVR:  This was stable in 2019 but this will be assessed as above.    CAD:   He had no symptoms.  He will continue with risk reduction.   ATRIAL FIBRILLATION:   He is status post watchman .  He does have subdural hematoma.  He seems to be maintaining sinus rhythm.  ICD:   He now has BiV ICD with normal function.   CKD:     I will check a basic metabolic profile and a CBC.   DYSLIPIDEMIA: I will also check a fasting LDL when I see him.  Previously he was therapeutic.   Current medicines are reviewed at length with the patient today.  The patient does not have concerns regarding medicines.  The following changes have been made:  None  Labs/ tests ordered today include:   Orders Placed This Encounter  Procedures   CBC   Comprehensive metabolic panel   Lipid panel   EKG 12-Lead   ECHOCARDIOGRAM COMPLETE      Disposition:   FU with me in one year.     Signed, Rollene Rotunda, MD  11/21/2022 6:19 PM    [facility]

## 2022-11-21 ENCOUNTER — Ambulatory Visit: Payer: Medicare Other | Admitting: Cardiology

## 2022-11-21 ENCOUNTER — Other Ambulatory Visit: Payer: Self-pay | Admitting: *Deleted

## 2022-11-21 ENCOUNTER — Encounter: Payer: Self-pay | Admitting: Cardiology

## 2022-11-21 VITALS — BP 110/72 | HR 64 | Ht 73.0 in | Wt 226.0 lb

## 2022-11-21 DIAGNOSIS — I480 Paroxysmal atrial fibrillation: Secondary | ICD-10-CM | POA: Diagnosis not present

## 2022-11-21 DIAGNOSIS — E7800 Pure hypercholesterolemia, unspecified: Secondary | ICD-10-CM

## 2022-11-21 DIAGNOSIS — I2510 Atherosclerotic heart disease of native coronary artery without angina pectoris: Secondary | ICD-10-CM

## 2022-11-21 DIAGNOSIS — Z79899 Other long term (current) drug therapy: Secondary | ICD-10-CM

## 2022-11-21 DIAGNOSIS — I5022 Chronic systolic (congestive) heart failure: Secondary | ICD-10-CM

## 2022-11-21 NOTE — Patient Instructions (Signed)
Medication Instructions:  The current medical regimen is effective;  continue present plan and medications.  *If you need a refill on your cardiac medications before your next appointment, please call your pharmacy*  Lab Work: Please have blood work at your closest [employer] [facility]  (Lipid, CBC and CMP)  If you have labs (blood work) drawn today and your tests are completely normal, you will receive your results only by: MyChart Message (if you have [facility]) OR A paper copy in the mail If you have any lab test that is abnormal or we need to change your treatment, we will call you to review the results.   Testing/Procedures: Your physician has requested that you have an echocardiogram. Echocardiography is a painless test that uses sound waves to create images of your heart. It provides your doctor with information about the size and shape of your heart and how well your heart's chambers and valves are working. This procedure takes approximately one hour. There are no restrictions for this procedure. Please do NOT wear cologne, perfume, aftershave, or lotions (deodorant is allowed). Please arrive 15 minutes prior to your appointment time.  You will be contacted to be scheduled for this test.   Follow-Up: At [facility], you and your health needs are our priority.  As part of our continuing mission to provide you with exceptional heart care, we have created designated Provider Care Teams.  These Care Teams include your primary Cardiologist (physician) and Advanced Practice Providers (APPs -  Physician Assistants and Nurse Practitioners) who all work together to provide you with the care you need, when you need it.  We recommend signing up for the patient portal called "MyChart".  Sign up information is provided on this After Visit Summary.  MyChart is used to connect with patients for Virtual Visits (Telemedicine).  Patients are able to view lab/test results, encounter notes, upcoming  appointments, etc.  Non-urgent messages can be sent to your provider as well.   To learn more about what you can do with MyChart, go to [email or web address].    Your next appointment:   1 year(s)  Provider:   Minus Breeding, MD

## 2022-12-06 ENCOUNTER — Other Ambulatory Visit: Payer: Self-pay | Admitting: Cardiology

## 2022-12-11 ENCOUNTER — Other Ambulatory Visit: Payer: Medicare Other

## 2022-12-17 ENCOUNTER — Ambulatory Visit: Payer: Medicare Other | Attending: Cardiovascular Disease

## 2022-12-17 DIAGNOSIS — Z95810 Presence of automatic (implantable) cardiac defibrillator: Secondary | ICD-10-CM

## 2022-12-17 DIAGNOSIS — I5022 Chronic systolic (congestive) heart failure: Secondary | ICD-10-CM

## 2022-12-20 ENCOUNTER — Ambulatory Visit: Payer: Medicare Other | Attending: Cardiology

## 2022-12-20 DIAGNOSIS — I5022 Chronic systolic (congestive) heart failure: Secondary | ICD-10-CM

## 2022-12-20 LAB — ECHOCARDIOGRAM COMPLETE
AR max vel: 2.71 cm2
AV Area VTI: 2.99 cm2
AV Area mean vel: 3.01 cm2
AV Mean grad: 5 mmHg
AV Peak grad: 9.4 mmHg
Ao pk vel: 1.53 m/s
Area-P 1/2: 1.01 cm2
Calc EF: 44.5 %
MV VTI: 1.11 cm2
P 1/2 time: 2228 msec
S' Lateral: 5 cm
Single Plane A2C EF: 42.5 %
Single Plane A4C EF: 44.8 %

## 2022-12-20 MED ORDER — PERFLUTREN LIPID MICROSPHERE
1.0000 mL | INTRAVENOUS | Status: AC | PRN
  Administered 2022-12-20: 2 mL via INTRAVENOUS

## 2022-12-20 NOTE — Progress Notes (Signed)
EPIC Encounter for ICM Monitoring  Patient Name: Lonnie [name] is a 77 y.o. male Date: 12/20/2022 Primary Care Physican: Elfredia Nevins, MD Primary Cardiologist: Hochrein Electrophysiologist: Mealor BiV Pacing: 97% 03/21/2022 Office Weight: 222 lbs      AT/AF Burden: <1% (he has a watchman; hx of subdural hematoma)           Transmission reviewed.    Corvue thoracic impedance suggesting normal fluid levels.   Prescribed: Furosemide 40 mg 1 tablet daily. (Wife distributes his medications)   Labs: 12/05/2021 Creatinine 1.65, BUN 21, Potassium 3.7, Sodium 140. GFR 43 A complete set of results can be found in Results Review.   Recommendations: No changes.    Follow-up plan: ICM clinic phone appointment 01/21/2023.  91 day device clinic remote transmission 03/26/2023.     EP/Cardiology Office Visits: Recall 11/16/2023 with Dr Antoine Poche.  Recall 03/21/2023 with Dr Nelly Laurence.   Copy of ICM check sent to Dr. Nelly Laurence.   3 month ICM trend: 12/17/2022.    12-14 Month ICM trend:     [name], RN 12/20/2022 3:59 PM

## 2022-12-25 ENCOUNTER — Ambulatory Visit (INDEPENDENT_AMBULATORY_CARE_PROVIDER_SITE_OTHER): Payer: Medicare Other

## 2022-12-25 DIAGNOSIS — I5022 Chronic systolic (congestive) heart failure: Secondary | ICD-10-CM | POA: Diagnosis not present

## 2022-12-26 LAB — CUP PACEART REMOTE DEVICE CHECK
Battery Remaining Longevity: 80 mo
Battery Remaining Percentage: 85 %
Battery Voltage: 2.99 V
Brady Statistic AP VP Percent: 82 %
Brady Statistic AP VS Percent: 1 %
Brady Statistic AS VP Percent: 14 %
Brady Statistic AS VS Percent: 2.4 %
Brady Statistic RA Percent Paced: 83 %
Date Time Interrogation Session: 20240416021423
HighPow Impedance: 66 Ohm
Implantable Lead Connection Status: 753985
Implantable Lead Connection Status: 753985
Implantable Lead Connection Status: 753985
Implantable Lead Implant Date: 20150326
Implantable Lead Implant Date: 20150326
Implantable Lead Implant Date: 20230405
Implantable Lead Location: 753858
Implantable Lead Location: 753859
Implantable Lead Location: 753860
Implantable Pulse Generator Implant Date: 20230405
Lead Channel Impedance Value: 1125 Ohm
Lead Channel Impedance Value: 300 Ohm
Lead Channel Impedance Value: 350 Ohm
Lead Channel Pacing Threshold Amplitude: 0.5 V
Lead Channel Pacing Threshold Amplitude: 0.875 V
Lead Channel Pacing Threshold Amplitude: 1 V
Lead Channel Pacing Threshold Pulse Width: 0.5 ms
Lead Channel Pacing Threshold Pulse Width: 0.5 ms
Lead Channel Pacing Threshold Pulse Width: 0.5 ms
Lead Channel Sensing Intrinsic Amplitude: 11.1 mV
Lead Channel Sensing Intrinsic Amplitude: 2.9 mV
Lead Channel Setting Pacing Amplitude: 1.5 V
Lead Channel Setting Pacing Amplitude: 1.875
Lead Channel Setting Pacing Amplitude: 2 V
Lead Channel Setting Pacing Pulse Width: 0.5 ms
Lead Channel Setting Pacing Pulse Width: 0.5 ms
Lead Channel Setting Sensing Sensitivity: 0.5 mV
Pulse Gen Serial Number: 210000168

## 2023-01-21 ENCOUNTER — Ambulatory Visit: Payer: Medicare Other | Attending: Cardiovascular Disease

## 2023-01-21 DIAGNOSIS — Z95810 Presence of automatic (implantable) cardiac defibrillator: Secondary | ICD-10-CM | POA: Diagnosis not present

## 2023-01-21 DIAGNOSIS — I5022 Chronic systolic (congestive) heart failure: Secondary | ICD-10-CM

## 2023-01-25 NOTE — Progress Notes (Signed)
EPIC Encounter for ICM Monitoring  Patient Name: Lonnie [name] is a 77 y.o. male Date: 01/25/2023 Primary Care Physican: Elfredia Nevins, MD Primary Cardiologist: Hochrein Electrophysiologist: Mealor BiV Pacing: 97% 03/21/2022 Office Weight: 222 lbs      AT/AF Burden: <1% (he has a watchman; hx of subdural hematoma)           Transmission reviewed.    Corvue thoracic impedance suggesting normal fluid levels.   Prescribed: Furosemide 40 mg 1 tablet daily. (Wife distributes his medications)   Labs: 12/05/2021 Creatinine 1.65, BUN 21, Potassium 3.7, Sodium 140. GFR 43 A complete set of results can be found in Results Review.   Recommendations: No changes.    Follow-up plan: ICM clinic phone appointment 02/25/2023.  91 day device clinic remote transmission 03/26/2023.     EP/Cardiology Office Visits: Recall 11/16/2023 with Dr Antoine Poche.  Recall 03/21/2023 with Dr Nelly Laurence.   Copy of ICM check sent to Dr. Nelly Laurence.     3 month ICM trend: 01/21/2023.    12-14 Month ICM trend:     [name], RN 01/25/2023 4:22 PM

## 2023-01-28 NOTE — Progress Notes (Signed)
Remote ICD transmission.   

## 2023-02-25 ENCOUNTER — Ambulatory Visit: Payer: Medicare Other | Attending: Cardiovascular Disease

## 2023-02-25 DIAGNOSIS — I5022 Chronic systolic (congestive) heart failure: Secondary | ICD-10-CM | POA: Diagnosis not present

## 2023-02-25 DIAGNOSIS — Z95810 Presence of automatic (implantable) cardiac defibrillator: Secondary | ICD-10-CM | POA: Diagnosis not present

## 2023-02-27 NOTE — Progress Notes (Signed)
EPIC Encounter for ICM Monitoring  Patient Name: Lonnie [name] is a 77 y.o. male Date: 02/27/2023 Primary Care Physican: Elfredia Nevins, MD Primary Cardiologist: Hochrein Electrophysiologist: Mealor BiV Pacing: 97% 03/21/2022 Office Weight: 222 lbs      AT/AF Burden: <1% (he has a watchman; hx of subdural hematoma)           Transmission reviewed.    Corvue thoracic impedance suggesting normal fluid levels.   Prescribed: Furosemide 40 mg 1 tablet daily. (Wife distributes his medications)   Labs: 12/05/2021 Creatinine 1.65, BUN 21, Potassium 3.7, Sodium 140. GFR 43 A complete set of results can be found in Results Review.   Recommendations: No changes.    Follow-up plan: ICM clinic phone appointment 04/01/2023.  91 day device clinic remote transmission 03/26/2023.     EP/Cardiology Office Visits: Recall 11/16/2023 with Dr Antoine Poche.  Recall 03/21/2023 with Dr Nelly Laurence.   Copy of ICM check sent to Dr. Nelly Laurence.   3 month ICM trend: 02/25/2023.    12-14 Month ICM trend:     [name], RN 02/27/2023 1:44 PM

## 2023-03-12 ENCOUNTER — Other Ambulatory Visit: Payer: Self-pay | Admitting: Cardiology

## 2023-03-26 ENCOUNTER — Ambulatory Visit (INDEPENDENT_AMBULATORY_CARE_PROVIDER_SITE_OTHER): Payer: Medicare Other

## 2023-03-26 DIAGNOSIS — I422 Other hypertrophic cardiomyopathy: Secondary | ICD-10-CM

## 2023-03-27 LAB — CUP PACEART REMOTE DEVICE CHECK
Battery Remaining Longevity: 79 mo
Battery Remaining Percentage: 83 %
Battery Voltage: 2.99 V
Brady Statistic AP VP Percent: 82 %
Brady Statistic AP VS Percent: 1 %
Brady Statistic AS VP Percent: 15 %
Brady Statistic AS VS Percent: 2.2 %
Brady Statistic RA Percent Paced: 83 %
Date Time Interrogation Session: 20240716020202
HighPow Impedance: 66 Ohm
Implantable Lead Connection Status: 753985
Implantable Lead Connection Status: 753985
Implantable Lead Connection Status: 753985
Implantable Lead Implant Date: 20150326
Implantable Lead Implant Date: 20150326
Implantable Lead Implant Date: 20230405
Implantable Lead Location: 753858
Implantable Lead Location: 753859
Implantable Lead Location: 753860
Implantable Pulse Generator Implant Date: 20230405
Lead Channel Impedance Value: 1125 Ohm
Lead Channel Impedance Value: 340 Ohm
Lead Channel Impedance Value: 350 Ohm
Lead Channel Pacing Threshold Amplitude: 0.5 V
Lead Channel Pacing Threshold Amplitude: 1 V
Lead Channel Pacing Threshold Amplitude: 1 V
Lead Channel Pacing Threshold Pulse Width: 0.5 ms
Lead Channel Pacing Threshold Pulse Width: 0.5 ms
Lead Channel Pacing Threshold Pulse Width: 0.5 ms
Lead Channel Sensing Intrinsic Amplitude: 10.4 mV
Lead Channel Sensing Intrinsic Amplitude: 2.9 mV
Lead Channel Setting Pacing Amplitude: 1.5 V
Lead Channel Setting Pacing Amplitude: 2 V
Lead Channel Setting Pacing Amplitude: 2 V
Lead Channel Setting Pacing Pulse Width: 0.5 ms
Lead Channel Setting Pacing Pulse Width: 0.5 ms
Lead Channel Setting Sensing Sensitivity: 0.5 mV
Pulse Gen Serial Number: 210000168

## 2023-04-01 ENCOUNTER — Ambulatory Visit: Payer: Medicare Other | Attending: Cardiovascular Disease

## 2023-04-01 DIAGNOSIS — I5022 Chronic systolic (congestive) heart failure: Secondary | ICD-10-CM

## 2023-04-01 DIAGNOSIS — Z95810 Presence of automatic (implantable) cardiac defibrillator: Secondary | ICD-10-CM

## 2023-04-03 NOTE — Progress Notes (Signed)
EPIC Encounter for ICM Monitoring  Patient Name: Lonnie [name] is a 77 y.o. male Date: 04/03/2023 Primary Care Physican: Elfredia Nevins, MD Primary Cardiologist: Hochrein Electrophysiologist: Mealor BiV Pacing: 97% 03/21/2022 Office Weight: 222 lbs      AT/AF Burden: <1% (he has a watchman; hx of subdural hematoma)           Transmission reviewed.    Corvue thoracic impedance suggesting normal fluid levels.   Prescribed: Furosemide 40 mg 1 tablet daily. (Wife distributes his medications)   Labs: 12/05/2021 Creatinine 1.65, BUN 21, Potassium 3.7, Sodium 140. GFR 43 A complete set of results can be found in Results Review.   Recommendations: No changes.    Follow-up plan: ICM clinic phone appointment 05/06/2023.  91 day device clinic remote transmission 06/25/2023.     EP/Cardiology Office Visits: Recall 11/16/2023 with Dr Antoine Poche.  Recall 03/21/2023 with Dr Nelly Laurence.   Copy of ICM check sent to Dr. Nelly Laurence.   3 month ICM trend: 04/01/2023.    12-14 Month ICM trend:     [name], RN 04/03/2023 7:51 AM

## 2023-04-11 NOTE — Progress Notes (Signed)
Remote ICD transmission.   

## 2023-05-06 ENCOUNTER — Ambulatory Visit: Payer: Medicare Other | Attending: Cardiovascular Disease

## 2023-05-06 DIAGNOSIS — I5022 Chronic systolic (congestive) heart failure: Secondary | ICD-10-CM

## 2023-05-06 DIAGNOSIS — Z95810 Presence of automatic (implantable) cardiac defibrillator: Secondary | ICD-10-CM | POA: Diagnosis not present

## 2023-05-16 NOTE — Progress Notes (Signed)
EPIC Encounter for ICM Monitoring  Patient Name: Lonnie [name] is a 77 y.o. male Date: 05/16/2023 Primary Care Physican: Elfredia Nevins, MD Primary Cardiologist: Hochrein Electrophysiologist: Mealor BiV Pacing: 97% 03/21/2022 Office Weight: 222 lbs      AT/AF Burden: <1% (he has a watchman; hx of subdural hematoma)           Transmission reviewed.    Corvue thoracic impedance suggesting normal fluid levels.   Prescribed: Furosemide 40 mg 1 tablet daily. (Wife distributes his medications)   Labs: 12/05/2021 Creatinine 1.65, BUN 21, Potassium 3.7, Sodium 140. GFR 43 A complete set of results can be found in Results Review.   Recommendations: No changes.    Follow-up plan: ICM clinic phone appointment 06/17/2023.  91 day device clinic remote transmission 06/25/2023.     EP/Cardiology Office Visits: Recall 11/16/2023 with Dr Antoine Poche.  Recall 03/21/2023 with Dr Nelly Laurence.   Copy of ICM check sent to Dr. Nelly Laurence.    3 month ICM trend: 05/06/2023.    12-14 Month ICM trend:     [name], RN 05/16/2023 3:47 PM

## 2023-06-17 ENCOUNTER — Ambulatory Visit: Payer: Medicare Other

## 2023-06-17 DIAGNOSIS — Z95810 Presence of automatic (implantable) cardiac defibrillator: Secondary | ICD-10-CM | POA: Diagnosis not present

## 2023-06-17 DIAGNOSIS — I5022 Chronic systolic (congestive) heart failure: Secondary | ICD-10-CM | POA: Diagnosis not present

## 2023-06-21 NOTE — Progress Notes (Signed)
EPIC Encounter for ICM Monitoring  Patient Name: Lonnie [name] is a 77 y.o. male Date: 06/21/2023 Primary Care Physican: Elfredia Nevins, MD Primary Cardiologist: Hochrein Electrophysiologist: Mealor BiV Pacing: 97% 03/21/2022 Office Weight: 222 lbs      AT/AF Burden: <1% (he has a watchman; hx of subdural hematoma)           Transmission reviewed.    Corvue thoracic impedance suggesting normal fluid levels with the exception of possible fluid accumulation from 9/16-9/28.   Prescribed: Furosemide 40 mg 1 tablet daily. (Wife distributes his medications)   Labs: 12/05/2021 Creatinine 1.65, BUN 21, Potassium 3.7, Sodium 140. GFR 43 A complete set of results can be found in Results Review.   Recommendations: No changes.    Follow-up plan: ICM clinic phone appointment 07/22/2023.  91 day device clinic remote transmission 09/24/2023.     EP/Cardiology Office Visits: Recall 11/16/2023 with Dr Antoine Poche.  Recall 03/21/2023 with Dr Nelly Laurence.   Copy of ICM check sent to Dr. Nelly Laurence.    3 month ICM trend: 06/17/2023.    12-14 Month ICM trend:     [name], RN 06/21/2023 4:15 PM

## 2023-06-25 ENCOUNTER — Ambulatory Visit (INDEPENDENT_AMBULATORY_CARE_PROVIDER_SITE_OTHER): Payer: Medicare Other

## 2023-06-25 DIAGNOSIS — I5022 Chronic systolic (congestive) heart failure: Secondary | ICD-10-CM

## 2023-06-25 DIAGNOSIS — I422 Other hypertrophic cardiomyopathy: Secondary | ICD-10-CM

## 2023-06-26 LAB — CUP PACEART REMOTE DEVICE CHECK
Battery Remaining Longevity: 75 mo
Battery Remaining Percentage: 80 %
Battery Voltage: 2.99 V
Brady Statistic AP VP Percent: 83 %
Brady Statistic AP VS Percent: 1 %
Brady Statistic AS VP Percent: 14 %
Brady Statistic AS VS Percent: 2.1 %
Brady Statistic RA Percent Paced: 83 %
Date Time Interrogation Session: 20241015024601
HighPow Impedance: 65 Ohm
Implantable Lead Connection Status: 753985
Implantable Lead Connection Status: 753985
Implantable Lead Connection Status: 753985
Implantable Lead Implant Date: 20150326
Implantable Lead Implant Date: 20150326
Implantable Lead Implant Date: 20230405
Implantable Lead Location: 753858
Implantable Lead Location: 753859
Implantable Lead Location: 753860
Implantable Pulse Generator Implant Date: 20230405
Lead Channel Impedance Value: 1075 Ohm
Lead Channel Impedance Value: 300 Ohm
Lead Channel Impedance Value: 340 Ohm
Lead Channel Pacing Threshold Amplitude: 0.5 V
Lead Channel Pacing Threshold Amplitude: 0.875 V
Lead Channel Pacing Threshold Amplitude: 1 V
Lead Channel Pacing Threshold Pulse Width: 0.5 ms
Lead Channel Pacing Threshold Pulse Width: 0.5 ms
Lead Channel Pacing Threshold Pulse Width: 0.5 ms
Lead Channel Sensing Intrinsic Amplitude: 10.4 mV
Lead Channel Sensing Intrinsic Amplitude: 2.9 mV
Lead Channel Setting Pacing Amplitude: 1.5 V
Lead Channel Setting Pacing Amplitude: 1.875
Lead Channel Setting Pacing Amplitude: 2 V
Lead Channel Setting Pacing Pulse Width: 0.5 ms
Lead Channel Setting Pacing Pulse Width: 0.5 ms
Lead Channel Setting Sensing Sensitivity: 0.5 mV
Pulse Gen Serial Number: 210000168

## 2023-07-15 NOTE — Progress Notes (Signed)
Remote ICD transmission.   

## 2023-07-17 ENCOUNTER — Other Ambulatory Visit: Payer: Self-pay | Admitting: Cardiology

## 2023-07-22 ENCOUNTER — Ambulatory Visit: Payer: Medicare Other | Attending: Cardiovascular Disease

## 2023-07-22 DIAGNOSIS — I5022 Chronic systolic (congestive) heart failure: Secondary | ICD-10-CM

## 2023-07-22 DIAGNOSIS — Z95810 Presence of automatic (implantable) cardiac defibrillator: Secondary | ICD-10-CM

## 2023-07-24 NOTE — Progress Notes (Signed)
EPIC Encounter for ICM Monitoring  Patient Name: Lonnie [name] is a 77 y.o. male Date: 07/24/2023 Primary Care Physican: Elfredia Nevins, MD Primary Cardiologist: Hochrein Electrophysiologist: Mealor BiV Pacing: 97%   AT/AF Burden: <1% (he has a watchman; hx of subdural hematoma)           Transmission reviewed.    Corvue thoracic impedance suggesting normal fluid levels with the exception of possible fluid accumulation from 10/26-11/5.   Prescribed: Furosemide 40 mg 1 tablet daily. (Wife distributes his medications)   Labs: 12/05/2021 Creatinine 1.65, BUN 21, Potassium 3.7, Sodium 140. GFR 43 A complete set of results can be found in Results Review.   Recommendations: No changes.    Follow-up plan: ICM clinic phone appointment 09/09/2023.  91 day device clinic remote transmission 09/24/2023.     EP/Cardiology Office Visits: Recall 11/16/2023 with Dr Antoine Poche.  Recall 03/21/2023 with Dr Nelly Laurence.   Copy of ICM check sent to Dr. Nelly Laurence.    3 month ICM trend: 07/22/2023.    12-14 Month ICM trend:     [name], RN 07/24/2023 2:45 PM

## 2023-08-24 DIAGNOSIS — W1839XA Other fall on same level, initial encounter: Secondary | ICD-10-CM | POA: Diagnosis not present

## 2023-08-24 DIAGNOSIS — S0512XA Contusion of eyeball and orbital tissues, left eye, initial encounter: Secondary | ICD-10-CM | POA: Diagnosis not present

## 2023-08-24 DIAGNOSIS — R030 Elevated blood-pressure reading, without diagnosis of hypertension: Secondary | ICD-10-CM | POA: Diagnosis not present

## 2023-09-09 ENCOUNTER — Ambulatory Visit: Payer: Medicare Other | Attending: Cardiovascular Disease

## 2023-09-09 DIAGNOSIS — Z95810 Presence of automatic (implantable) cardiac defibrillator: Secondary | ICD-10-CM | POA: Diagnosis not present

## 2023-09-09 DIAGNOSIS — I5022 Chronic systolic (congestive) heart failure: Secondary | ICD-10-CM | POA: Diagnosis not present

## 2023-09-24 ENCOUNTER — Ambulatory Visit (INDEPENDENT_AMBULATORY_CARE_PROVIDER_SITE_OTHER): Payer: Medicare Other

## 2023-09-24 DIAGNOSIS — I422 Other hypertrophic cardiomyopathy: Secondary | ICD-10-CM

## 2023-09-24 LAB — CUP PACEART REMOTE DEVICE CHECK
Battery Remaining Longevity: 73 mo
Battery Remaining Percentage: 77 %
Battery Voltage: 2.98 V
Brady Statistic AP VP Percent: 83 %
Brady Statistic AP VS Percent: 1 %
Brady Statistic AS VP Percent: 14 %
Brady Statistic AS VS Percent: 1.9 %
Brady Statistic RA Percent Paced: 84 %
Date Time Interrogation Session: 20250114020921
HighPow Impedance: 65 Ohm
Implantable Lead Connection Status: 753985
Implantable Lead Connection Status: 753985
Implantable Lead Connection Status: 753985
Implantable Lead Implant Date: 20150326
Implantable Lead Implant Date: 20150326
Implantable Lead Implant Date: 20230405
Implantable Lead Location: 753858
Implantable Lead Location: 753859
Implantable Lead Location: 753860
Implantable Pulse Generator Implant Date: 20230405
Lead Channel Impedance Value: 1050 Ohm
Lead Channel Impedance Value: 300 Ohm
Lead Channel Impedance Value: 350 Ohm
Lead Channel Pacing Threshold Amplitude: 0.5 V
Lead Channel Pacing Threshold Amplitude: 1 V
Lead Channel Pacing Threshold Amplitude: 1 V
Lead Channel Pacing Threshold Pulse Width: 0.5 ms
Lead Channel Pacing Threshold Pulse Width: 0.5 ms
Lead Channel Pacing Threshold Pulse Width: 0.5 ms
Lead Channel Sensing Intrinsic Amplitude: 1.6 mV
Lead Channel Sensing Intrinsic Amplitude: 12 mV
Lead Channel Setting Pacing Amplitude: 1.5 V
Lead Channel Setting Pacing Amplitude: 2 V
Lead Channel Setting Pacing Amplitude: 2 V
Lead Channel Setting Pacing Pulse Width: 0.5 ms
Lead Channel Setting Pacing Pulse Width: 0.5 ms
Lead Channel Setting Sensing Sensitivity: 0.5 mV
Pulse Gen Serial Number: 210000168

## 2023-10-10 ENCOUNTER — Ambulatory Visit: Payer: Medicare Other | Attending: Cardiovascular Disease | Admitting: Cardiovascular Disease

## 2023-10-10 ENCOUNTER — Encounter: Payer: Self-pay | Admitting: Cardiovascular Disease

## 2023-10-10 VITALS — BP 140/82 | HR 70 | Ht 73.0 in | Wt 237.0 lb

## 2023-10-10 DIAGNOSIS — I5022 Chronic systolic (congestive) heart failure: Secondary | ICD-10-CM | POA: Diagnosis not present

## 2023-10-10 DIAGNOSIS — I480 Paroxysmal atrial fibrillation: Secondary | ICD-10-CM | POA: Diagnosis not present

## 2023-10-10 DIAGNOSIS — I422 Other hypertrophic cardiomyopathy: Secondary | ICD-10-CM | POA: Diagnosis not present

## 2023-10-10 DIAGNOSIS — Z95810 Presence of automatic (implantable) cardiac defibrillator: Secondary | ICD-10-CM | POA: Diagnosis not present

## 2023-10-10 MED ORDER — BISOPROLOL FUMARATE 10 MG PO TABS: 20.0000 mg | ORAL_TABLET | Freq: Every day | ORAL | 3 refills | Status: DC

## 2023-10-10 NOTE — Progress Notes (Signed)
  Electrophysiology Office Note:    Date:  10/10/2023   ID:  Lonnie [name], DOB 08/31/1946, MRN [id number]  PCP:  Lonnie Nevins, MD   [facility] Providers Cardiologist:  Rollene Rotunda, MD Electrophysiologist:  Maurice Small, MD     Referring MD: Lonnie Nevins, MD   History of Present Illness:    Lonnie [name] is a 78 y.o. male with a medical history significant for CABG and bioprosthetic mitral valve replacement, late presenting MI, VT/Vf cardiac arrest with papillary muscle rupture referred for arrhythmia and device follow-up.     He has an Abbott ICD placed in March 2015.  He has had device therapy before.  The device was upgraded to a BiV in April 2023.  He also has a history of atrial fibrillation and Watchman procedure.       Today, he reports that he is at baseline.  He has not had any recent palpitations, presyncope, syncope.  His exertional capacity is baseline.  He notes dependent edema, but states that that this has been unchanged for months if not longer.  EKGs/Labs/Other Studies Reviewed Today:     Echocardiogram:  TTE April 2024 EF 45 to 50%.  Moderate asymptomatic LVH.  Left atrium moderately dilated, right atrium mildly dilated.  Mitral valve replacement with trivial MR.    EKG:   EKG Interpretation Date/Time:  Thursday October 10 2023 16:24:58 EST Ventricular Rate:  70 PR Interval:    QRS Duration:  184 QT Interval:  504 QTC Calculation: 544 R Axis:   -68  Text Interpretation: Atrial tachycardia, Ventricular-paced rhythm Biventricular pacemaker detected When compared with ECG of 13-Dec-2021 16:38, Atrial tachycardia now present Confirmed by York Pellant [phone]) on 10/10/2023 8:42:46 PM     Physical Exam:    VS:  BP (!) 140/82 (BP Location: Left Arm, Patient Position: Sitting, Cuff Size: Large)   Pulse 70   Ht 6\' 1"  (1.854 m)   Wt 237 lb (107.5 kg)   SpO2 96%   BMI 31.27 kg/m     Wt Readings from Last 3 Encounters:   10/10/23 237 lb (107.5 kg)  11/21/22 226 lb (102.5 kg)  03/21/22 222 lb 6.4 oz (100.9 kg)     GEN:  Well nourished, well developed in no acute distress CARDIAC: RRR, no murmurs, rubs, gallops RESPIRATORY:  Normal work of breathing MUSCULOSKELETAL: 2+ edema    ASSESSMENT & PLAN:     Medtronic BiV ICD Device was interrogated today.  I reviewed the interrogation.  See Paceart for details He is not device dependent.   Secondary Hypercoagulable state History of subdural hematoma Watchman device in place  Systolic heart failure Will increase bisoprolol to 20 mg daily today  Atrial tachycardia Atrial heart rate is 170 bpm today. He is unaware, asymptomatic The device is not reliably tracking his device due to the upper rate limit Will increase bisoprolol today to 20 mg daily      Signed, Maurice Small, MD  10/10/2023 8:44 PM    [facility]

## 2023-10-10 NOTE — Patient Instructions (Signed)
Medication Instructions:  INCREASE Bisoprolol to 20 mg once daily *If you need a refill on your cardiac medications before your next appointment, please call your pharmacy*   Follow-Up: At [facility], you and your health needs are our priority.  As part of our continuing mission to provide you with exceptional heart care, we have created designated Provider Care Teams.  These Care Teams include your primary Cardiologist (physician) and Advanced Practice Providers (APPs -  Physician Assistants and Nurse Practitioners) who all work together to provide you with the care you need, when you need it.  We recommend signing up for the patient portal called "MyChart".  Sign up information is provided on this After Visit Summary.  MyChart is used to connect with patients for Virtual Visits (Telemedicine).  Patients are able to view lab/test results, encounter notes, upcoming appointments, etc.  Non-urgent messages can be sent to your provider as well.   To learn more about what you can do with MyChart, go to [email or web address].    Your next appointment:   6 month(s)  Provider:   York Pellant, MD

## 2023-10-14 ENCOUNTER — Ambulatory Visit: Payer: Medicare Other | Attending: Cardiovascular Disease

## 2023-10-14 DIAGNOSIS — Z95810 Presence of automatic (implantable) cardiac defibrillator: Secondary | ICD-10-CM | POA: Diagnosis not present

## 2023-10-14 DIAGNOSIS — I5022 Chronic systolic (congestive) heart failure: Secondary | ICD-10-CM | POA: Diagnosis not present

## 2023-10-15 ENCOUNTER — Telehealth: Payer: Self-pay

## 2023-10-15 NOTE — Progress Notes (Signed)
EPIC Encounter for ICM Monitoring  Patient Name: Lonnie [name] is a 78 y.o. male Date: 10/15/2023 Primary Care Physican: Elfredia Nevins, MD Primary Cardiologist: Hochrein Electrophysiologist: [facility] BiV Pacing: 90%   AT/AF Burden: >99% (he has a watchman; hx of subdural hematoma)           Attempted call to patient and unable to reach.  Left detailed message per DPR regarding transmission.  Transmission results reviewed.    Corvue thoracic impedance suggesting possible fluid accumulation starting 1/31 which may be effected by increased AT/AF Burden.  Message sent to device clinic 2/4 regarding increased AT/AF Burden that appears to have started 1/18.   Prescribed: Furosemide 40 mg 1 tablet daily. (Wife distributes his medications)   Labs: 12/05/2021 Creatinine 1.65, BUN 21, Potassium 3.7, Sodium 140. GFR 43 A complete set of results can be found in Results Review.   Recommendations: Left voice mail with ICM number and encouraged to call if experiencing any fluid symptoms.   Follow-up plan: ICM clinic phone appointment 10/21/2023 to recheck fluid levels.  91 day device clinic remote transmission 12/24/2023.     EP/Cardiology Office Visits: Recall 11/16/2023 with Dr Antoine Poche.     Recall 05/15/2024 with Dr Nelly Laurence.   Copy of ICM check sent to Dr. Nelly Laurence.    3 month ICM trend: 10/15/2023.    12-14 Month ICM trend:     [name], RN 10/15/2023 7:54 AM

## 2023-10-15 NOTE — Telephone Encounter (Signed)
 Remote ICM transmission received.  Attempted call to patient regarding ICM remote transmission and left detailed message per DPR.  Left ICM phone number and advised to return call for any fluid symptoms or questions. Next ICM remote transmission scheduled 10/21/2023.

## 2023-10-17 ENCOUNTER — Other Ambulatory Visit: Payer: Self-pay | Admitting: Cardiology

## 2023-10-21 ENCOUNTER — Ambulatory Visit: Payer: Medicare Other | Attending: Cardiovascular Disease

## 2023-10-21 DIAGNOSIS — I5022 Chronic systolic (congestive) heart failure: Secondary | ICD-10-CM

## 2023-10-21 DIAGNOSIS — Z95810 Presence of automatic (implantable) cardiac defibrillator: Secondary | ICD-10-CM

## 2023-10-22 ENCOUNTER — Encounter: Payer: Self-pay | Admitting: Internal Medicine

## 2023-10-22 NOTE — Progress Notes (Signed)
Alert remote transmission: AF burden >threshold-recurrent alert. AF on the 2/11 remote that via trending has been nearly persistent since ~ 1/19.  S/P Watchman.  AF alerts turned off.  Will monitor rates.  Sending for awareness.  Abnormal HF. On HF monitoring.  Follow up as scheduled. [place], CVRS   Routed to AM MD as FYI.

## 2023-10-23 NOTE — Progress Notes (Signed)
EPIC Encounter for ICM Monitoring  Patient Name: Lonnie [name] is a 78 y.o. male Date: 10/23/2023 Primary Care Physican: Elfredia Nevins, MD Primary Cardiologist: Hochrein Electrophysiologist: [facility] BiV Pacing: 89%   AT/AF Burden: >99% (he has a watchman; hx of subdural hematoma)           Transmission results reviewed.    Corvue thoracic impedance suggesting fluid levels returned to normal.   Prescribed: Furosemide 40 mg 1 tablet daily. (Wife distributes his medications)   Labs: 12/05/2021 Creatinine 1.65, BUN 21, Potassium 3.7, Sodium 140. GFR 43 A complete set of results can be found in Results Review.   Recommendations:  No changes.   Follow-up plan: ICM clinic phone appointment 11/18/2023.  91 day device clinic remote transmission 12/24/2023.     EP/Cardiology Office Visits: Recall 11/16/2023 with Dr Antoine Poche.     Recall 05/15/2024 with Dr Nelly Laurence.   Copy of ICM check sent to Dr. Nelly Laurence.    3 month ICM trend: 10/22/2023.    12-14 Month ICM trend:     [name], RN 10/23/2023 2:18 PM

## 2023-11-06 NOTE — Progress Notes (Signed)
 Remote ICD transmission.

## 2023-11-15 ENCOUNTER — Encounter: Payer: Medicare Other | Admitting: Cardiovascular Disease

## 2023-11-18 ENCOUNTER — Ambulatory Visit: Payer: Medicare Other | Attending: Cardiovascular Disease

## 2023-11-18 DIAGNOSIS — Z95810 Presence of automatic (implantable) cardiac defibrillator: Secondary | ICD-10-CM

## 2023-11-18 DIAGNOSIS — I5022 Chronic systolic (congestive) heart failure: Secondary | ICD-10-CM | POA: Diagnosis not present

## 2023-11-19 ENCOUNTER — Telehealth: Payer: Self-pay

## 2023-11-19 NOTE — Progress Notes (Signed)
 EPIC Encounter for ICM Monitoring  Patient Name: Lonnie [name] is a 78 y.o. male Date: 11/19/2023 Primary Care Physican: Elfredia Nevins, MD Primary Cardiologist: Hochrein Electrophysiologist: [facility] BiV Pacing: 86%   AT/AF Burden: >99% (he has a watchman; hx of subdural hematoma)           Attempted call to patient and unable to reach.  Left detailed message per DPR regarding transmission.  Transmission results reviewed.    Corvue thoracic impedance suggesting possible fluid accumulation starting 2/28.   Prescribed: Furosemide 40 mg 1 tablet daily. (Wife distributes his medications)   Labs: 12/05/2021 Creatinine 1.65, BUN 21, Potassium 3.7, Sodium 140. GFR 43 A complete set of results can be found in Results Review.   Recommendations:  Left voice mail with ICM number and encouraged to call if experiencing any fluid symptoms.   Follow-up plan: ICM clinic phone appointment 11/25/2023 to recheck fluid levels.  91 day device clinic remote transmission 12/24/2023.     EP/Cardiology Office Visits: Recall 11/16/2023 with Dr Antoine Poche.     Recall 05/15/2024 with Dr Nelly Laurence.   Copy of ICM check sent to Dr. Nelly Laurence.    3 month ICM trend: 11/18/2023.    12-14 Month ICM trend:     [name], RN 11/19/2023 2:53 PM

## 2023-11-19 NOTE — Telephone Encounter (Signed)
 Remote ICM transmission received.  Attempted call to patient regarding ICM remote transmission and left detailed message per DPR.  Left ICM phone number and advised to return call for any fluid symptoms or questions. Next ICM remote transmission scheduled 11/25/2023.

## 2023-11-25 ENCOUNTER — Ambulatory Visit: Attending: Cardiovascular Disease

## 2023-11-25 DIAGNOSIS — I5022 Chronic systolic (congestive) heart failure: Secondary | ICD-10-CM

## 2023-11-25 DIAGNOSIS — Z95810 Presence of automatic (implantable) cardiac defibrillator: Secondary | ICD-10-CM

## 2023-11-26 ENCOUNTER — Other Ambulatory Visit: Payer: Self-pay | Admitting: Cardiology

## 2023-11-26 NOTE — Progress Notes (Signed)
 EPIC Encounter for ICM Monitoring  Patient Name: Lonnie [name] is a 78 y.o. male Date: 11/26/2023 Primary Care Physican: Elfredia Nevins, MD Primary Cardiologist: Hochrein Electrophysiologist: [facility] BiV Pacing: 85%   AT/AF Burden: >99% (he has a watchman; hx of subdural hematoma)            Transmission results reviewed.    Corvue thoracic impedance suggesting fluid levels returned to normal.   Prescribed: Furosemide 40 mg 1 tablet daily. (Wife distributes his medications)   Labs: 12/05/2021 Creatinine 1.65, BUN 21, Potassium 3.7, Sodium 140. GFR 43 A complete set of results can be found in Results Review.   Recommendations:  No changes.   Follow-up plan: ICM clinic phone appointment 12/30/2023.  91 day device clinic remote transmission 12/24/2023.     EP/Cardiology Office Visits: Recall 11/16/2023 with Dr Antoine Poche.   05/15/2024 with Dr Nelly Laurence.   Copy of ICM check sent to Dr. Nelly Laurence.    3 month ICM trend: 11/25/2023.    12-14 Month ICM trend:     [name], RN 11/26/2023 3:07 PM

## 2023-11-28 MED ORDER — HYDRALAZINE HCL 50 MG PO TABS: 50.0000 mg | ORAL_TABLET | Freq: Two times a day (BID) | ORAL | 0 refills | Status: DC

## 2023-11-28 MED ORDER — ATORVASTATIN CALCIUM 80 MG PO TABS: 80.0000 mg | ORAL_TABLET | Freq: Every day | ORAL | 0 refills | Status: AC

## 2023-11-28 MED ORDER — HYDRALAZINE HCL 50 MG PO TABS: 50.0000 mg | ORAL_TABLET | Freq: Two times a day (BID) | ORAL | 0 refills | Status: AC

## 2023-11-28 MED ORDER — FUROSEMIDE 40 MG PO TABS: 40.0000 mg | ORAL_TABLET | Freq: Every day | ORAL | 0 refills | Status: AC

## 2023-11-28 MED ORDER — ISOSORBIDE MONONITRATE ER 60 MG PO TB24: 60.0000 mg | ORAL_TABLET | Freq: Every day | ORAL | 0 refills | Status: DC

## 2023-11-28 MED ORDER — FUROSEMIDE 40 MG PO TABS: 40.0000 mg | ORAL_TABLET | Freq: Every day | ORAL | 0 refills | Status: DC

## 2023-11-28 MED ORDER — ATORVASTATIN CALCIUM 80 MG PO TABS: 80.0000 mg | ORAL_TABLET | Freq: Every day | ORAL | 0 refills | Status: DC

## 2023-11-28 NOTE — Addendum Note (Signed)
 Addended by: Adriana Simas, Darrel Gloss L on: 11/28/2023 10:58 AM   Modules accepted: Orders

## 2023-11-28 NOTE — Addendum Note (Signed)
 Addended by: Adriana Simas, Demarko Zeimet L on: 11/28/2023 09:27 AM   Modules accepted: Orders

## 2023-12-06 ENCOUNTER — Telehealth: Payer: Self-pay

## 2023-12-06 NOTE — Telephone Encounter (Signed)
 Attempted to contact patient to schedule an appointment - left message to call our office back

## 2023-12-06 NOTE — Telephone Encounter (Signed)
 Alert remote transmission:  BiV pacing < limit, currently 85%, trending lower with persistent AF/AFL with overall controlled rates, Watchman per PA report Route to triage, if non-actionalbe discontinue alert Follow up as scheduled. [place], CVRS  Last seen in clinic 10/10/23. ATAF started ~end of Dec. Beginning of Jan. Spoke w/ MD. Wants to get patient back into clinic to talk about ablation.

## 2023-12-06 NOTE — Telephone Encounter (Signed)
 Left Message for patient to call back to notify him that Dr. Nelly Laurence would like to see him in clinic to talk about an ablation for persistent AT. Also, assess symptoms.

## 2023-12-12 NOTE — Telephone Encounter (Signed)
 I spoke with pt wife and got her scheduled with Dr. Nelly Laurence in [place] on 5/2 at 2:30 pm.

## 2023-12-24 ENCOUNTER — Ambulatory Visit: Payer: Medicare Other

## 2023-12-24 DIAGNOSIS — I420 Dilated cardiomyopathy: Secondary | ICD-10-CM

## 2023-12-24 LAB — CUP PACEART REMOTE DEVICE CHECK
Battery Remaining Longevity: 70 mo
Battery Remaining Percentage: 74 %
Battery Voltage: 2.98 V
Brady Statistic AP VP Percent: 1.1 %
Brady Statistic AP VS Percent: 1 %
Brady Statistic AS VP Percent: 96 %
Brady Statistic AS VS Percent: 2.3 %
Brady Statistic RA Percent Paced: 1 %
Date Time Interrogation Session: 20250415023352
HighPow Impedance: 63 Ohm
Implantable Lead Connection Status: 753985
Implantable Lead Connection Status: 753985
Implantable Lead Connection Status: 753985
Implantable Lead Implant Date: 20150326
Implantable Lead Implant Date: 20150326
Implantable Lead Implant Date: 20230405
Implantable Lead Location: 753858
Implantable Lead Location: 753859
Implantable Lead Location: 753860
Implantable Pulse Generator Implant Date: 20230405
Lead Channel Impedance Value: 1075 Ohm
Lead Channel Impedance Value: 300 Ohm
Lead Channel Impedance Value: 340 Ohm
Lead Channel Pacing Threshold Amplitude: 0.5 V
Lead Channel Pacing Threshold Amplitude: 1.125 V
Lead Channel Pacing Threshold Amplitude: 1.5 V
Lead Channel Pacing Threshold Pulse Width: 0.5 ms
Lead Channel Pacing Threshold Pulse Width: 0.5 ms
Lead Channel Pacing Threshold Pulse Width: 0.5 ms
Lead Channel Sensing Intrinsic Amplitude: 1.8 mV
Lead Channel Sensing Intrinsic Amplitude: 12 mV
Lead Channel Setting Pacing Amplitude: 1.5 V
Lead Channel Setting Pacing Amplitude: 2.125
Lead Channel Setting Pacing Amplitude: 2.5 V
Lead Channel Setting Pacing Pulse Width: 0.5 ms
Lead Channel Setting Pacing Pulse Width: 0.5 ms
Lead Channel Setting Sensing Sensitivity: 0.5 mV
Pulse Gen Serial Number: 210000168

## 2023-12-26 ENCOUNTER — Encounter: Payer: Self-pay | Admitting: Internal Medicine

## 2023-12-30 ENCOUNTER — Ambulatory Visit: Attending: Cardiovascular Disease

## 2023-12-30 DIAGNOSIS — Z95810 Presence of automatic (implantable) cardiac defibrillator: Secondary | ICD-10-CM | POA: Diagnosis not present

## 2023-12-30 DIAGNOSIS — I5022 Chronic systolic (congestive) heart failure: Secondary | ICD-10-CM

## 2024-01-01 NOTE — Progress Notes (Signed)
 EPIC Encounter for ICM Monitoring  Patient Name: Lonnie [name] is a 78 y.o. male Date: 01/01/2024 Primary Care Physican: Kathyleen Parkins, MD Primary Cardiologist: Hochrein Electrophysiologist: [facility] BiV Pacing: 83%   AT/AF Burden: >99% (watchman; hx of subdural hematoma)           Transmission results reviewed.    Corvue thoracic impedance suggesting normal fluid levels with the exception of possible fluid accumulation from 4/12-4/19.   Prescribed: Furosemide  40 mg 1 tablet daily. (Wife distributes his medications)   Labs: 12/05/2021 Creatinine 1.65, BUN 21, Potassium 3.7, Sodium 140. GFR 43 A complete set of results can be found in Results Review.   Recommendations:  No changes.   Follow-up plan: ICM clinic phone appointment 02/04/2024.  91 day device clinic remote transmission 03/24/2024.     EP/Cardiology Office Visits:  01/03/2024 with Dr Lavonne Prairie.   01/10/2024 with Dr Arlester Ladd.   Copy of ICM check sent to Dr. Arlester Ladd.   3 month ICM trend: 12/30/2023.    12-14 Month ICM trend:     Almyra Jain, RN 01/01/2024 10:03 AM

## 2024-01-02 NOTE — Progress Notes (Unsigned)
 Cardiology Office Note:   Date:  01/03/2024  ID:  Lonnie [name], DOB 03/03/1946, MRN [id number] PCP: Kathyleen Parkins, MD  [facility] Providers Cardiologist:  Eilleen Grates, MD Electrophysiologist:  Efraim Grange, MD {  History of Present Illness:   Lonnie [name] is a 78 y.o. male who presents for evaluation after CABG and mitral valve replacement. He presented late after myocardial infarction. 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 He ultimately required CABG with bioprosthetic mitral valve replacement. He had a resultant EF of 30-35%.   He suffered cardiac arrest on 2/22 with VT/VF.  Follow up cath demonstrated patent bypass grafts.  He initially went home with a Life Vest.  He subsequently returned for ICD placement on 12/03/13.  In November of that same year his ICD fired while he was at work. Prior to that he had a syncopal episode and suffered a subdural hematoma. He has recovered from this and now is status post Watchman.  His last EF is 25%.   At one point  he was noted to be in NSR.  He had been in what was thought to be chronic atrial fib.    He was upgraded to a BiV device.  Ejection fraction in April 2024 demonstrated EF to be improved to 45 to 50%.  Since I last saw him he has done okay.  He does not do a lot by his choice although he says he will be a little more active now that it is getting warmer. The patient denies any new symptoms such as chest discomfort, neck or arm discomfort. There has been no new shortness of breath, PND or orthopnea. There have been no reported palpitations, presyncope or syncope.    He does have chronic lower extremity swelling but he actually thinks it is better than previous.  He has his feet sitting down on the floor quite a bit.  ROS: As stated in the HPI and negative for all other systems.  Studies Reviewed:    EKG:   NA  Risk Assessment/Calculations:    CHA2DS2-VASc Score = 4   This indicates a  4.8% annual risk of stroke. The patient's score is based upon: CHF History: 1 HTN History: 0 Diabetes History: 0 Stroke History: 0 Vascular Disease History: 1 Age Score: 2 Gender Score: 0   Physical Exam:   VS:  BP 106/68   Pulse 81   Ht 6\' 1"  (1.854 m)   Wt 226 lb 3.2 oz (102.6 kg)   SpO2 91%   BMI 29.84 kg/m    Wt Readings from Last 3 Encounters:  01/03/24 226 lb 3.2 oz (102.6 kg)  10/10/23 237 lb (107.5 kg)  11/21/22 226 lb (102.5 kg)     GEN: Well nourished, well developed in no acute distress NECK: No JVD; No carotid bruits CARDIAC: Irregular RR, no murmurs, rubs, gallops RESPIRATORY:  Clear to auscultation without rales, wheezing or rhonchi  ABDOMEN: Soft, non-tender, non-distended EXTREMITIES:  Right greater than left leg edema; No deformity   ASSESSMENT AND PLAN:   CARDIOMYOPATHY:    EF was 25 - 30% in 2019.     We are avoiding ARB's and ARNI secondary to CKD.  He just had his dose of beta-blocker increased.  He seems to be euvolemic.  No change in therapy.  His last EF was 45 to 50%.  MVR: This was stable on echo in 2024.  No change in therapy.    CAD:   He  is having no new symptoms.  He will continue with risk reduction.   ATRIAL FIBRILLATION:   He is status post watchman .   He has 99% A-fib or greater on his device.  I did review the device interrogation.    ICD:   He now has BiV ICD.  He has normal function and saw EP earlier this year.   CKD:    Creatinine has not been checked in a while and I will check a basic metabolic profile, CBC.   DYSLIPIDEMIA: Will check a lipid profile.  Goal will be an LDL in the 50s.       Follow up with me in 1 year or sooner if needed.  Signed, Eilleen Grates, MD

## 2024-01-03 ENCOUNTER — Ambulatory Visit: Admitting: Cardiology

## 2024-01-03 ENCOUNTER — Encounter: Payer: Self-pay | Admitting: Cardiology

## 2024-01-03 VITALS — BP 106/68 | HR 81 | Ht 73.0 in | Wt 226.2 lb

## 2024-01-03 DIAGNOSIS — E7800 Pure hypercholesterolemia, unspecified: Secondary | ICD-10-CM | POA: Diagnosis not present

## 2024-01-03 DIAGNOSIS — I255 Ischemic cardiomyopathy: Secondary | ICD-10-CM

## 2024-01-03 DIAGNOSIS — I480 Paroxysmal atrial fibrillation: Secondary | ICD-10-CM

## 2024-01-03 DIAGNOSIS — Z95810 Presence of automatic (implantable) cardiac defibrillator: Secondary | ICD-10-CM

## 2024-01-03 DIAGNOSIS — Z952 Presence of prosthetic heart valve: Secondary | ICD-10-CM | POA: Diagnosis not present

## 2024-01-03 DIAGNOSIS — I2510 Atherosclerotic heart disease of native coronary artery without angina pectoris: Secondary | ICD-10-CM

## 2024-01-03 NOTE — Patient Instructions (Signed)
 Medication Instructions:  Your physician recommends that you continue on your current medications as directed. Please refer to the Current Medication list given to you today.  *If you need a refill on your cardiac medications before your next appointment, please call your pharmacy*  Lab Work: Your physician recommends that you return for lab work fasting   If you have labs (blood work) drawn today and your tests are completely normal, you will receive your results only by: MyChart Message (if you have MyChart) OR A paper copy in the mail If you have any lab test that is abnormal or we need to change your treatment, we will call you to review the results.  Testing/Procedures: NONE   Follow-Up: At [facility], you and your health needs are our priority.  As part of our continuing mission to provide you with exceptional heart care, our providers are all part of one team.  This team includes your primary Cardiologist (physician) and Advanced Practice Providers or APPs (Physician Assistants and Nurse Practitioners) who all work together to provide you with the care you need, when you need it.  Your next appointment:   1 year(s)  Provider:   Eilleen Grates, MD    We recommend signing up for the patient portal called "MyChart".  Sign up information is provided on this After Visit Summary.  MyChart is used to connect with patients for Virtual Visits (Telemedicine).  Patients are able to view lab/test results, encounter notes, upcoming appointments, etc.  Non-urgent messages can be sent to your provider as well.   To learn more about what you can do with MyChart, go to [email or web address].   Other Instructions Thank you for choosing [facility]!

## 2024-01-10 ENCOUNTER — Encounter: Payer: Medicare Other | Admitting: Cardiovascular Disease

## 2024-01-10 ENCOUNTER — Ambulatory Visit: Attending: Cardiovascular Disease | Admitting: Cardiovascular Disease

## 2024-01-10 VITALS — BP 108/62 | HR 70 | Ht 73.0 in | Wt 229.0 lb

## 2024-01-10 DIAGNOSIS — I10 Essential (primary) hypertension: Secondary | ICD-10-CM | POA: Diagnosis not present

## 2024-01-10 DIAGNOSIS — I480 Paroxysmal atrial fibrillation: Secondary | ICD-10-CM

## 2024-01-10 DIAGNOSIS — Z79899 Other long term (current) drug therapy: Secondary | ICD-10-CM | POA: Diagnosis not present

## 2024-01-10 DIAGNOSIS — Z01812 Encounter for preprocedural laboratory examination: Secondary | ICD-10-CM

## 2024-01-10 MED ORDER — APIXABAN 5 MG PO TABS: 5.0000 mg | ORAL_TABLET | Freq: Two times a day (BID) | ORAL | 6 refills | Status: AC

## 2024-01-10 MED ORDER — APIXABAN 5 MG PO TABS: 5.0000 mg | ORAL_TABLET | Freq: Two times a day (BID) | ORAL | 0 refills | Status: DC

## 2024-01-10 NOTE — Patient Instructions (Addendum)
 Medication Instructions:   Begin Eliquis  5mg  twice a day   Continue all other current medications.   Labwork:  BMET, CBC  - orders given   Testing/Procedures:  Your physician has recommended that you have a Cardioversion (DCCV). Electrical Cardioversion uses a jolt of electricity to your heart either through paddles or wired patches attached to your chest. This is a controlled, usually prescheduled, procedure. Defibrillation is done under light anesthesia in the hospital, and you usually go home the day of the procedure. This is done to get your heart back into a normal rhythm. You are not awake for the procedure. Please see the instruction sheet given to you today - AFTER BEING ON THE ELIQUIS  X 1 MONTH   Follow-Up:  1-2 months after DCCV - [facility] in [place]   Any Other Special Instructions Will Be Listed Below (If Applicable).   If you need a refill on your cardiac medications before your next appointment, please call your pharmacy.

## 2024-01-10 NOTE — Progress Notes (Signed)
  Electrophysiology Office Note:    Date:  01/10/2024   ID:  Lonnie [name], DOB 1945/09/30, MRN [id number]  PCP:  Kathyleen Parkins, MD   [facility] Providers Cardiologist:  Eilleen Grates, MD Electrophysiologist:  Efraim Grange, MD     Referring MD: Kathyleen Parkins, MD   History of Present Illness:    Lonnie [name] is a 78 y.o. male with a medical history significant for CABG and bioprosthetic mitral valve replacement, late presenting MI, VT/Vf cardiac arrest with papillary muscle rupture referred for arrhythmia and device follow-up.     He has an Abbott ICD placed in March 2015.  He has had device therapy before.  The device was upgraded to a BiV in April 2023.  He also has a history of atrial fibrillation and Watchman procedure.  He has device detected atrial a flutter, BiV pacing has been suboptimal due to his arrhythmia.       Today, he reports that he is at baseline.  He has not had any recent palpitations, presyncope, syncope.  His exertional capacity is baseline.  He notes dependent edema, but states that that this has been unchanged for months if not longer.  EKGs/Labs/Other Studies Reviewed Today:     Echocardiogram:  TTE April 2024 EF 45 to 50%.  Moderate asymptomatic LVH.  Left atrium moderately dilated, right atrium mildly dilated.  Mitral valve replacement with trivial MR.    EKG:   EKG Interpretation Date/Time:  Friday Jan 10 2024 14:22:02 EDT Ventricular Rate:  70 PR Interval:    QRS Duration:  174 QT Interval:  510 QTC Calculation: 550 R Axis:   -68  Text Interpretation: Ventricular-paced rhythm Biventricular pacemaker detected When compared with ECG of 10-Oct-2023 16:24, No significant change was found Confirmed by Marlane Silver [phone]) on 01/10/2024 2:52:56 PM     Physical Exam:    VS:  BP 108/62   Pulse 70   Ht 6\' 1"  (1.854 m)   Wt 229 lb (103.9 kg)   SpO2 98%   BMI 30.21 kg/m     Wt Readings from Last 3 Encounters:   01/10/24 229 lb (103.9 kg)  01/03/24 226 lb 3.2 oz (102.6 kg)  10/10/23 237 lb (107.5 kg)     GEN:  Well nourished, well developed in no acute distress CARDIAC: RRR, no murmurs, rubs, gallops RESPIRATORY:  Normal work of breathing MUSCULOSKELETAL: 2+ edema    ASSESSMENT & PLAN:     Medtronic BiV ICD Device was interrogated today.  I reviewed the interrogation.  See Paceart for details He is not device dependent.   Secondary Hypercoagulable state History of subdural hematoma Watchman device in place  Systolic heart failure Will increase bisoprolol  to 20 mg daily today  Atrial tachycardia - likely a slow atrial flutter He is unaware, asymptomatic V-Rate is now better controlled with Bisoprolol , BiV pacing improved Will plan for DCCV Will start anticoagulation -- with watchman in place, I think NOAC appropriate - start apixaban  5mg  BID Follow-up in 4-8 weeks after cardioversion      Signed, Efraim Grange, MD  01/10/2024 2:53 PM    [facility]

## 2024-01-26 ENCOUNTER — Other Ambulatory Visit: Payer: Self-pay | Admitting: Cardiology

## 2024-02-04 ENCOUNTER — Encounter: Payer: Self-pay | Admitting: *Deleted

## 2024-02-04 ENCOUNTER — Ambulatory Visit: Attending: Cardiovascular Disease

## 2024-02-04 DIAGNOSIS — I5022 Chronic systolic (congestive) heart failure: Secondary | ICD-10-CM

## 2024-02-04 DIAGNOSIS — Z95810 Presence of automatic (implantable) cardiac defibrillator: Secondary | ICD-10-CM | POA: Diagnosis not present

## 2024-02-06 ENCOUNTER — Other Ambulatory Visit: Payer: Self-pay | Admitting: *Deleted

## 2024-02-06 DIAGNOSIS — I4892 Unspecified atrial flutter: Secondary | ICD-10-CM

## 2024-02-06 NOTE — Progress Notes (Signed)
 EPIC Encounter for ICM Monitoring  Patient Name: Lonnie [name] is a 78 y.o. male Date: 02/06/2024 Primary Care Physican: Kathyleen Parkins, MD Primary Cardiologist: Hochrein Electrophysiologist: [facility] BiV Pacing: 81%   AT/AF Burden: >99% (watchman; hx of subdural hematoma)           Transmission results reviewed.    Corvue thoracic impedance suggesting possible fluid accumulation starting 5/22 and returned close to baseline.   Prescribed: Furosemide  40 mg 1 tablet daily. (Wife distributes his medications)   Labs: 12/05/2021 Creatinine 1.65, BUN 21, Potassium 3.7, Sodium 140. GFR 43 A complete set of results can be found in Results Review.   Recommendations:  No changes.   Follow-up plan: ICM clinic phone appointment 03/30/2024.  91 day device clinic remote transmission 03/24/2024.     EP/Cardiology Office Visits:    05/15/2024 with Dr Arlester Ladd.   Copy of ICM check sent to Dr. Arlester Ladd.   3 month ICM trend: 02/04/2024.    12-14 Month ICM trend:     Almyra Jain, RN 02/06/2024 2:52 PM

## 2024-02-07 NOTE — Progress Notes (Signed)
 Remote ICD transmission.

## 2024-02-10 ENCOUNTER — Telehealth: Payer: Self-pay | Admitting: Cardiology

## 2024-02-10 NOTE — Telephone Encounter (Signed)
 Checking percert on the following patient for testing scheduled at [facility].   DCCV scheduled for June 6th with Dr. Amanda Jungling.

## 2024-02-11 NOTE — Patient Instructions (Signed)
 Lonnie [name]  02/11/2024     @PREFPERIOPPHARMACY @   Your procedure is scheduled on 02/14/2024.   Report to [facility] at  0700  A.M.   Call this number if you have problems the morning of surgery:  [phone]  If you experience any cold or flu symptoms such as cough, fever, chills, shortness of breath, etc. between now and your scheduled surgery, please notify us  at the above number.   Remember:        DO NOT miss any doses of your eliquis  before your procedure.    Do not eat after midnight.    You may drink clear liquids until  0500 am on 02/14/2024.    Clear liquids allowed are:                    Water, Juice (No red color; non-citric and without pulp; diabetics please choose diet or no sugar options), Carbonated beverages (diabetics please choose diet or no sugar options), Clear Tea (No creamer, milk, or cream, including half & half and powdered creamer), Black Coffee Only (No creamer, milk or cream, including half & half and powdered creamer), and Clear Sports drink (No red color; diabetics please choose diet or no sugar options)    Take these medicines the morning of surgery with A SIP OF WATER                                              eliquis , isosorbide .    Do not wear jewelry, make-up or nail polish, including gel polish,  artificial nails, or any other type of covering on natural nails (fingers and  toes).  Do not wear lotions, powders, or perfumes, or deodorant.  Do not shave 48 hours prior to surgery.  Men may shave face and neck.  Do not bring valuables to the hospital.  [facility] is not responsible for any belongings or valuables.  Contacts, dentures or bridgework may not be worn into surgery.  Leave your suitcase in the car.  After surgery it may be brought to your room.  For patients admitted to the hospital, discharge time will be determined by your treatment team.  Patients discharged the day of surgery will not be allowed to drive home and  must have someone with them for 24 hours.    Special instructions:  DO NOT smoke tobacco or vape for 24 hours before your procedure.  Please read over the following fact sheets that you were given. Anesthesia Post-op Instructions and Care and Recovery After Surgery      Electrical Cardioversion Electrical cardioversion is the delivery of a jolt of electricity to restore a normal rhythm to the heart. A rhythm that is too fast or is not regular (arrhythmia) keeps the heart from pumping blood well. There is also another type of cardioversion called a chemical (pharmacologic) cardioversion. This is when your health care provider gives you one or more medicines to bring back your regular heart rhythm. Electrical cardioversion is done as a scheduled procedure for arrhythmiasthat are not life-threatening. Electrical cardioversion may also be done in an emergency for sudden life-threatening arrhythmias. Tell a health care provider about: Any allergies you have. All medicines you are taking, including vitamins, herbs, eye drops, creams, and over-the-counter medicines. Any problems you or family members have had with sedatives  or anesthesia. Any bleeding problems you have. Any surgeries you have had, including a pacemaker, defibrillator, or other implanted device. Any medical conditions you have. Whether you are pregnant or may be pregnant. What are the risks? Your provider will talk with you about risks. These include: Allergic reactions to medicines. Irritation to the skin on your chest or back where the sticky pads (electrodes) or paddles were put during electrical cardioversion. A blood clot that breaks free and travels to other parts of your body, such as your brain. Return of a worse abnormal heart rhythm that will need to be treated with medicines, a pacemaker, or an implantable cardioverter defibrillator (ICD). What happens before the procedure? Medicines Your provider may give  you: Blood-thinning medicines (anticoagulants) so your blood does not clot as easily. If your provider gives you this medicine, you may need to take it for 4 weeks before the procedure. Medicines to help stabilize your heart rate and rhythm. Ask your provider about: Changing or stopping your regular medicines. These include any diabetes medicines or blood thinners you take. Taking medicines such as aspirin  and ibuprofen. These medicines can thin your blood. Do not take them unless your provider tells you to. Taking over-the-counter medicines, vitamins, herbs, and supplements. General instructions Follow instructions from your provider about what you may eat and drink. Do not put any lotions, powders, or ointments on your chest and back for 24 hours before the procedure. They can cause problems with the electrodes or paddles used to deliver electricity to your heart. Do not wear jewelry as this can interfere with delivering electricity to your heart. If you will be going home right after the procedure, plan to have a responsible adult: Take you home from the hospital or clinic. You will not be allowed to drive. Care for you for the time you are told. Tests You may have an exam or testing. This may include: Blood labs. A transesophageal echocardiogram (TEE). What happens during the procedure?     An IV will be inserted into one of your veins. You will be given a sedative. This helps you relax. Electrodes or metal paddles will be placed on your chest. They may be placed in one of these ways: One placed on your right chest, the other on the left ribs. One placed on your chest and the other on your back. An electrical shock will be delivered. The shock briefly stops (resets) your heart rhythm. Your provider will check to see if your heart rhythm is now normal. Some people need only one shock. Some need more to restore a normal heart rhythm. The procedure may vary among providers and  hospitals. What happens after the procedure? Your blood pressure, heart rate, breathing rate, and blood oxygen level will be monitored until you leave the hospital or clinic. Your heart rhythm will be watched to make sure it does not change. This information is not intended to replace advice given to you by your health care provider. Make sure you discuss any questions you have with your health care provider. Document Revised: 04/19/2022 Document Reviewed: 04/19/2022 Elsevier Patient Education  2024 Elsevier Inc.General Anesthesia, Adult, Care After The following information offers guidance on how to care for yourself after your procedure. Your health care provider may also give you more specific instructions. If you have problems or questions, contact your health care provider. What can I expect after the procedure? After the procedure, it is common for people to: Have pain or discomfort at the IV  site. Have nausea or vomiting. Have a sore throat or hoarseness. Have trouble concentrating. Feel cold or chills. Feel weak, sleepy, or tired (fatigue). Have soreness and body aches. These can affect parts of the body that were not involved in surgery. Follow these instructions at home: For the time period you were told by your health care provider:  Rest. Do not participate in activities where you could fall or become injured. Do not drive or use machinery. Do not drink alcohol. Do not take sleeping pills or medicines that cause drowsiness. Do not make important decisions or sign legal documents. Do not take care of children on your own. General instructions Drink enough fluid to keep your urine pale yellow. If you have sleep apnea, surgery and certain medicines can increase your risk for breathing problems. Follow instructions from your health care provider about wearing your sleep device: Anytime you are sleeping, including during daytime naps. While taking prescription pain medicines,  sleeping medicines, or medicines that make you drowsy. Return to your normal activities as told by your health care provider. Ask your health care provider what activities are safe for you. Take over-the-counter and prescription medicines only as told by your health care provider. Do not use any products that contain nicotine or tobacco. These products include cigarettes, chewing tobacco, and vaping devices, such as e-cigarettes. These can delay incision healing after surgery. If you need help quitting, ask your health care provider. Contact a health care provider if: You have nausea or vomiting that does not get better with medicine. You vomit every time you eat or drink. You have pain that does not get better with medicine. You cannot urinate or have bloody urine. You develop a skin rash. You have a fever. Get help right away if: You have trouble breathing. You have chest pain. You vomit blood. These symptoms may be an emergency. Get help right away. Call 911. Do not wait to see if the symptoms will go away. Do not drive yourself to the hospital. Summary After the procedure, it is common to have a sore throat, hoarseness, nausea, vomiting, or to feel weak, sleepy, or fatigue. For the time period you were told by your health care provider, do not drive or use machinery. Get help right away if you have difficulty breathing, have chest pain, or vomit blood. These symptoms may be an emergency. This information is not intended to replace advice given to you by your health care provider. Make sure you discuss any questions you have with your health care provider. Document Revised: 11/24/2021 Document Reviewed: 11/24/2021 Elsevier Patient Education  2024 [employer].

## 2024-02-12 ENCOUNTER — Encounter (HOSPITAL_COMMUNITY)
Admission: RE | Admit: 2024-02-12 | Discharge: 2024-02-12 | Disposition: A | Discharge status: Home / Self Care | Source: Ambulatory Visit | Attending: Cardiology | Admitting: Cardiology

## 2024-02-12 ENCOUNTER — Encounter: Payer: Self-pay | Admitting: Internal Medicine

## 2024-02-12 ENCOUNTER — Encounter (HOSPITAL_COMMUNITY): Payer: Self-pay

## 2024-02-12 ENCOUNTER — Other Ambulatory Visit: Payer: Self-pay

## 2024-02-12 ENCOUNTER — Other Ambulatory Visit (HOSPITAL_COMMUNITY)
Admission: RE | Admit: 2024-02-12 | Discharge: 2024-02-12 | Disposition: A | Discharge status: Home / Self Care | Source: Ambulatory Visit | Attending: Cardiovascular Disease | Admitting: Cardiovascular Disease

## 2024-02-12 ENCOUNTER — Encounter (HOSPITAL_COMMUNITY)

## 2024-02-12 DIAGNOSIS — Z01812 Encounter for preprocedural laboratory examination: Secondary | ICD-10-CM

## 2024-02-12 DIAGNOSIS — I10 Essential (primary) hypertension: Secondary | ICD-10-CM

## 2024-02-12 DIAGNOSIS — I129 Hypertensive chronic kidney disease with stage 1 through stage 4 chronic kidney disease, or unspecified chronic kidney disease: Secondary | ICD-10-CM | POA: Diagnosis not present

## 2024-02-12 DIAGNOSIS — N182 Chronic kidney disease, stage 2 (mild): Secondary | ICD-10-CM

## 2024-02-12 DIAGNOSIS — N1831 Chronic kidney disease, stage 3a: Secondary | ICD-10-CM | POA: Insufficient documentation

## 2024-02-12 DIAGNOSIS — I480 Paroxysmal atrial fibrillation: Secondary | ICD-10-CM | POA: Diagnosis not present

## 2024-02-12 LAB — BASIC METABOLIC PANEL WITH GFR
Anion gap: 8 (ref 5–15)
BUN: 26 mg/dL — ABNORMAL HIGH (ref 8–23)
CO2: 25 mmol/L (ref 22–32)
Calcium: 8.5 mg/dL — ABNORMAL LOW (ref 8.9–10.3)
Chloride: 104 mmol/L (ref 98–111)
Creatinine, Ser: 1.68 mg/dL — ABNORMAL HIGH (ref 0.61–1.24)
GFR, Estimated: 41 mL/min — ABNORMAL LOW
Glucose, Bld: 104 mg/dL — ABNORMAL HIGH (ref 70–99)
Potassium: 3.9 mmol/L (ref 3.5–5.1)
Sodium: 137 mmol/L (ref 135–145)

## 2024-02-12 LAB — CBC
HCT: 32.5 % — ABNORMAL LOW (ref 39.0–52.0)
Hemoglobin: 10.5 g/dL — ABNORMAL LOW (ref 13.0–17.0)
MCH: 29.7 pg (ref 26.0–34.0)
MCHC: 32.3 g/dL (ref 30.0–36.0)
MCV: 91.8 fL (ref 80.0–100.0)
Platelets: 256 10*3/uL (ref 150–400)
RBC: 3.54 MIL/uL — ABNORMAL LOW (ref 4.22–5.81)
RDW: 17.2 % — ABNORMAL HIGH (ref 11.5–15.5)
WBC: 7.6 10*3/uL (ref 4.0–10.5)
nRBC: 0 % (ref 0.0–0.2)

## 2024-02-12 NOTE — Progress Notes (Signed)
 PERIOPERATIVE PRESCRIPTION FOR IMPLANTED CARDIAC DEVICE PROGRAMMING  Patient Information: Name:  Lonnie [name]  DOB:  [birth year]  MRN:  [id number]  Planned Procedure:  cardioversion Surgeon:  Dr. Amanda Jungling Date of Procedure: 02/14/2024 Position during surgery:  supine  Device Information:  Clinic EP Physician:  Manya Sells, MD   Device Type:  Pacemaker and Defibrillator Manufacturer and Phone #:  St. Jude/Abbott: [phone] Pacemaker Dependent?:  No. Date of Last Device Check:  02/04/2024 Normal Device Function?:  Yes.    Electrophysiologist's Recommendations:  Have magnet available. Provide continuous ECG monitoring when magnet is used or reprogramming is to be performed.  Procedure should not interfere with device function.  No device programming or magnet placement needed. Ensure equipment used for cardioversion is not placed directly on top of the device.  Per Device Clinic Standing Orders, Forrestine Ike, RN  12:35 PM 02/12/2024

## 2024-02-13 ENCOUNTER — Ambulatory Visit: Payer: Self-pay | Admitting: Cardiovascular Disease

## 2024-02-13 NOTE — H&P (Signed)
 Procedure H&P  Patient presents for outpatient electrical cardioversion for atrial flutter. He is on eliquis  5mg  bid at home for anticoagulation. Plan for electrical cardioversion with assistance of anesthesiology. For full medical history please refer to referenced clinic note below  Armida Lander MD   Electrophysiology Office Note:     Date:  01/10/2024    ID:  Lonnie [name], DOB [birth year], MRN [id number]   PCP:  Kathyleen Parkins, MD              [facility] Providers Cardiologist:  Eilleen Grates, MD Electrophysiologist:  Efraim Grange, MD      Referring MD: Kathyleen Parkins, MD    History of Present Illness:     Lonnie [name] is a 78 y.o. male with a medical history significant for CABG and bioprosthetic mitral valve replacement, late presenting MI, VT/Vf cardiac arrest with papillary muscle rupture referred for arrhythmia and device follow-up.   Narrative History  He has an Abbott ICD placed in March 2015.  He has had device therapy before.  The device was upgraded to a BiV in April 2023.   He also has a history of atrial fibrillation and Watchman procedure.   He has device detected atrial a flutter, BiV pacing has been suboptimal due to his arrhythmia.           Today, he reports that he is at baseline.  He has not had any recent palpitations, presyncope, syncope.  His exertional capacity is baseline.  He notes dependent edema, but states that that this has been unchanged for months if not longer.   EKGs/Labs/Other Studies Reviewed Today:       Echocardiogram:   TTE April 2024 EF 45 to 50%.  Moderate asymptomatic LVH.  Left atrium moderately dilated, right atrium mildly dilated.  Mitral valve replacement with trivial MR.       EKG:   EKG Interpretation Date/Time:                  Friday Jan 10 2024 14:22:02 EDT Ventricular Rate:         70 PR Interval:                   QRS Duration:             174 QT Interval:                 510 QTC  Calculation:550 R Axis:                         -68   Text Interpretation:Ventricular-paced rhythm Biventricular pacemaker detected When compared with ECG of 10-Oct-2023 16:24, No significant change was found Confirmed by Marlane Silver [phone]) on 01/10/2024 2:52:56 PM        Physical Exam:     VS:  BP 108/62   Pulse 70   Ht 6\' 1"  (1.854 m)   Wt 229 lb (103.9 kg)   SpO2 98%   BMI 30.21 kg/m         Wt Readings from Last 3 Encounters:  01/10/24 229 lb (103.9 kg)  01/03/24 226 lb 3.2 oz (102.6 kg)  10/10/23 237 lb (107.5 kg)      GEN:  Well nourished, well developed in no acute distress CARDIAC: RRR, no murmurs, rubs, gallops RESPIRATORY:  Normal work of breathing MUSCULOSKELETAL: 2+ edema       ASSESSMENT & PLAN:  Medtronic BiV ICD Device was interrogated today.  I reviewed the interrogation.  See Paceart for details He is not device dependent.    Secondary Hypercoagulable state History of subdural hematoma Watchman device in place   Systolic heart failure Will increase bisoprolol  to 20 mg daily today   Atrial tachycardia - likely a slow atrial flutter He is unaware, asymptomatic V-Rate is now better controlled with Bisoprolol , BiV pacing improved Will plan for DCCV Will start anticoagulation -- with watchman in place, I think NOAC appropriate - start apixaban  5mg  BID Follow-up in 4-8 weeks after cardioversion           Signed, Efraim Grange, MD  01/10/2024 2:53 PM    [facility]

## 2024-02-14 ENCOUNTER — Ambulatory Visit (HOSPITAL_COMMUNITY): Admitting: Anesthesiology

## 2024-02-14 ENCOUNTER — Other Ambulatory Visit: Payer: Self-pay

## 2024-02-14 ENCOUNTER — Encounter (HOSPITAL_COMMUNITY)
Admission: RE | Disposition: A | Discharge status: Home / Self Care | Payer: Self-pay | Source: Home / Self Care | Attending: Cardiology

## 2024-02-14 ENCOUNTER — Ambulatory Visit (HOSPITAL_COMMUNITY)
Admission: RE | Admit: 2024-02-14 | Discharge: 2024-02-14 | Disposition: A | Discharge status: Home / Self Care | Attending: Cardiology | Admitting: Cardiology

## 2024-02-14 ENCOUNTER — Encounter (HOSPITAL_COMMUNITY): Payer: Self-pay | Admitting: Cardiology

## 2024-02-14 DIAGNOSIS — Z95810 Presence of automatic (implantable) cardiac defibrillator: Secondary | ICD-10-CM | POA: Insufficient documentation

## 2024-02-14 DIAGNOSIS — I110 Hypertensive heart disease with heart failure: Secondary | ICD-10-CM

## 2024-02-14 DIAGNOSIS — Z952 Presence of prosthetic heart valve: Secondary | ICD-10-CM | POA: Diagnosis not present

## 2024-02-14 DIAGNOSIS — I4819 Other persistent atrial fibrillation: Secondary | ICD-10-CM | POA: Diagnosis not present

## 2024-02-14 DIAGNOSIS — I5022 Chronic systolic (congestive) heart failure: Secondary | ICD-10-CM | POA: Diagnosis not present

## 2024-02-14 DIAGNOSIS — I509 Heart failure, unspecified: Secondary | ICD-10-CM | POA: Diagnosis not present

## 2024-02-14 DIAGNOSIS — Z8674 Personal history of sudden cardiac arrest: Secondary | ICD-10-CM | POA: Diagnosis not present

## 2024-02-14 DIAGNOSIS — I2510 Atherosclerotic heart disease of native coronary artery without angina pectoris: Secondary | ICD-10-CM | POA: Insufficient documentation

## 2024-02-14 DIAGNOSIS — Z87891 Personal history of nicotine dependence: Secondary | ICD-10-CM | POA: Insufficient documentation

## 2024-02-14 DIAGNOSIS — I4892 Unspecified atrial flutter: Secondary | ICD-10-CM

## 2024-02-14 DIAGNOSIS — I252 Old myocardial infarction: Secondary | ICD-10-CM | POA: Insufficient documentation

## 2024-02-14 DIAGNOSIS — Z951 Presence of aortocoronary bypass graft: Secondary | ICD-10-CM | POA: Diagnosis not present

## 2024-02-14 HISTORY — PX: CARDIOVERSION: SHX1299

## 2024-02-14 SURGERY — CARDIOVERSION
Anesthesia: General

## 2024-02-14 MED ORDER — LACTATED RINGERS IV SOLN: INTRAVENOUS | Status: DC

## 2024-02-14 MED ORDER — ORAL CARE MOUTH RINSE: 15.0000 mL | Freq: Once | OROMUCOSAL | Status: DC

## 2024-02-14 MED ORDER — PROPOFOL 500 MG/50ML IV EMUL
INTRAVENOUS | Status: DC | PRN
  Administered 2024-02-14: 70 mg via INTRAVENOUS

## 2024-02-14 MED ORDER — PROPOFOL 10 MG/ML IV BOLUS
INTRAVENOUS | Status: AC
  Filled 2024-02-14: qty 20

## 2024-02-14 MED ORDER — CHLORHEXIDINE GLUCONATE 0.12 % MT SOLN: 15.0000 mL | Freq: Once | OROMUCOSAL | Status: DC

## 2024-02-14 MED ORDER — SODIUM CHLORIDE 0.9 % IV SOLN: INTRAVENOUS | Status: DC

## 2024-02-14 NOTE — Anesthesia Preprocedure Evaluation (Signed)
 Anesthesia Evaluation  Patient identified by MRN, date of birth, ID band Patient awake    Reviewed: Allergy & Precautions, H&P , NPO status , Patient's Chart, lab work & pertinent test results, reviewed documented beta blocker date and time   History of Anesthesia Complications (+) history of anesthetic complications  Airway Mallampati: II  TM Distance: >3 FB Neck ROM: full    Dental no notable dental hx.    Pulmonary neg pulmonary ROS, former smoker   Pulmonary exam normal breath sounds clear to auscultation       Cardiovascular Exercise Tolerance: Good hypertension, + CAD, + Past MI and +CHF  + dysrhythmias Atrial Fibrillation  Rhythm:regular Rate:Normal     Neuro/Psych CVA negative neurological ROS  negative psych ROS   GI/Hepatic negative GI ROS, Neg liver ROS,,,  Endo/Other  negative endocrine ROS    Renal/GU Renal diseasenegative Renal ROS  negative genitourinary   Musculoskeletal   Abdominal   Peds  Hematology negative hematology ROS (+)   Anesthesia Other Findings   Reproductive/Obstetrics negative OB ROS                             Anesthesia Physical Anesthesia Plan  ASA: 3  Anesthesia Plan: General   Post-op Pain Management:    Induction:   PONV Risk Score and Plan: Propofol  infusion  Airway Management Planned:   Additional Equipment:   Intra-op Plan:   Post-operative Plan:   Informed Consent: I have reviewed the patients History and Physical, chart, labs and discussed the procedure including the risks, benefits and alternatives for the proposed anesthesia with the patient or authorized representative who has indicated his/her understanding and acceptance.     Dental Advisory Given  Plan Discussed with: CRNA  Anesthesia Plan Comments:        Anesthesia Quick Evaluation

## 2024-02-14 NOTE — CV Procedure (Signed)
 CV Procedure Note  Procedure; Electrical cardioversion Physician: Dr Armida Lander Indication: atrial flutter   Patient was brought to the procedure suite after appropriate consent was obtained. I confirmed with patient he had not missed any doses of his anticoagulation within the last 3 weeks. Defib pads placed in the anterior and posterior positions. Succesfully converted from atrial flutter to A sensed V paced rhythm. Cardipulmonary monitoring performed throughout the procedure, he tolerated well without complications.   Armida Lander MD

## 2024-02-14 NOTE — Transfer of Care (Signed)
 Immediate Anesthesia Transfer of Care Note  Patient: Lonnie [name]  Procedure(s) Performed: CARDIOVERSION  Patient Location: PACU  Anesthesia Type:General  Level of Consciousness: drowsy  Airway & Oxygen Therapy: Patient Spontanous Breathing and Patient connected to nasal cannula oxygen  Post-op Assessment: Report given to RN and Post -op Vital signs reviewed and stable  Post vital signs: Reviewed and stable  Last Vitals:  Vitals Value Taken Time  BP 88/53   Temp 97.8   Pulse 68   Resp 20   SpO2 97%     Last Pain:  Vitals:   02/14/24 0809  PainSc: 0-No pain         Complications: No notable events documented.

## 2024-02-14 NOTE — Clinical Note (Incomplete)
 Discharged from PACU in a Ventricular Paced Rhytym.

## 2024-02-14 NOTE — Anesthesia Postprocedure Evaluation (Signed)
 Anesthesia Post Note  Patient: Lonnie [name]  Procedure(s) Performed: CARDIOVERSION  Patient location during evaluation: Phase II Anesthesia Type: General Level of consciousness: awake Pain management: pain level controlled Vital Signs Assessment: post-procedure vital signs reviewed and stable Respiratory status: spontaneous breathing and respiratory function stable Cardiovascular status: blood pressure returned to baseline and stable Postop Assessment: no headache and no apparent nausea or vomiting Anesthetic complications: no Comments: Late entry   No notable events documented.   Last Vitals:  Vitals:   02/14/24 0945 02/14/24 1008  BP: 95/65 (!) 103/56  Pulse: 65 67  Resp: 19 18  Temp: 36.8 C 36.8 C  SpO2: 93% 96%    Last Pain:  Vitals:   02/14/24 1008  TempSrc: Oral  PainSc: 0-No pain                 Coretha Dew

## 2024-02-15 ENCOUNTER — Encounter (HOSPITAL_COMMUNITY): Payer: Self-pay | Admitting: Cardiology

## 2024-03-24 ENCOUNTER — Ambulatory Visit: Payer: Medicare Other

## 2024-03-24 DIAGNOSIS — I422 Other hypertrophic cardiomyopathy: Secondary | ICD-10-CM | POA: Diagnosis not present

## 2024-03-25 LAB — CUP PACEART REMOTE DEVICE CHECK
Battery Remaining Longevity: 67 mo
Battery Remaining Percentage: 71 %
Battery Voltage: 2.98 V
Brady Statistic AP VP Percent: 83 %
Brady Statistic AP VS Percent: 1 %
Brady Statistic AS VP Percent: 16 %
Brady Statistic AS VS Percent: 1 %
Brady Statistic RA Percent Paced: 23 %
Date Time Interrogation Session: 20250715020745
HighPow Impedance: 66 Ohm
Implantable Lead Connection Status: 753985
Implantable Lead Connection Status: 753985
Implantable Lead Connection Status: 753985
Implantable Lead Implant Date: 20150326
Implantable Lead Implant Date: 20150326
Implantable Lead Implant Date: 20230405
Implantable Lead Location: 753858
Implantable Lead Location: 753859
Implantable Lead Location: 753860
Implantable Pulse Generator Implant Date: 20230405
Lead Channel Impedance Value: 1000 Ohm
Lead Channel Impedance Value: 350 Ohm
Lead Channel Impedance Value: 350 Ohm
Lead Channel Pacing Threshold Amplitude: 0.5 V
Lead Channel Pacing Threshold Amplitude: 0.875 V
Lead Channel Pacing Threshold Amplitude: 1.25 V
Lead Channel Pacing Threshold Pulse Width: 0.5 ms
Lead Channel Pacing Threshold Pulse Width: 0.5 ms
Lead Channel Pacing Threshold Pulse Width: 0.5 ms
Lead Channel Sensing Intrinsic Amplitude: 11.8 mV
Lead Channel Sensing Intrinsic Amplitude: 3.5 mV
Lead Channel Setting Pacing Amplitude: 1.5 V
Lead Channel Setting Pacing Amplitude: 1.875
Lead Channel Setting Pacing Amplitude: 2.5 V
Lead Channel Setting Pacing Pulse Width: 0.5 ms
Lead Channel Setting Pacing Pulse Width: 0.5 ms
Lead Channel Setting Sensing Sensitivity: 0.5 mV
Pulse Gen Serial Number: 210000168

## 2024-03-30 ENCOUNTER — Ambulatory Visit: Payer: Self-pay | Admitting: Internal Medicine

## 2024-03-30 ENCOUNTER — Ambulatory Visit: Attending: Nurse Practitioner | Admitting: Nurse Practitioner

## 2024-03-30 ENCOUNTER — Encounter: Payer: Self-pay | Admitting: Nurse Practitioner

## 2024-03-30 ENCOUNTER — Ambulatory Visit: Attending: Cardiovascular Disease

## 2024-03-30 VITALS — BP 112/72 | HR 64 | Ht 72.0 in | Wt 218.2 lb

## 2024-03-30 DIAGNOSIS — Z95818 Presence of other cardiac implants and grafts: Secondary | ICD-10-CM | POA: Diagnosis not present

## 2024-03-30 DIAGNOSIS — Z95810 Presence of automatic (implantable) cardiac defibrillator: Secondary | ICD-10-CM

## 2024-03-30 DIAGNOSIS — I480 Paroxysmal atrial fibrillation: Secondary | ICD-10-CM | POA: Diagnosis not present

## 2024-03-30 DIAGNOSIS — N1832 Chronic kidney disease, stage 3b: Secondary | ICD-10-CM

## 2024-03-30 DIAGNOSIS — I2510 Atherosclerotic heart disease of native coronary artery without angina pectoris: Secondary | ICD-10-CM | POA: Diagnosis not present

## 2024-03-30 DIAGNOSIS — Z79899 Other long term (current) drug therapy: Secondary | ICD-10-CM

## 2024-03-30 DIAGNOSIS — I4892 Unspecified atrial flutter: Secondary | ICD-10-CM | POA: Diagnosis not present

## 2024-03-30 DIAGNOSIS — Z8674 Personal history of sudden cardiac arrest: Secondary | ICD-10-CM

## 2024-03-30 DIAGNOSIS — I5022 Chronic systolic (congestive) heart failure: Secondary | ICD-10-CM | POA: Diagnosis not present

## 2024-03-30 DIAGNOSIS — Z9889 Other specified postprocedural states: Secondary | ICD-10-CM

## 2024-03-30 DIAGNOSIS — Z952 Presence of prosthetic heart valve: Secondary | ICD-10-CM | POA: Diagnosis not present

## 2024-03-30 DIAGNOSIS — N1831 Chronic kidney disease, stage 3a: Secondary | ICD-10-CM

## 2024-03-30 DIAGNOSIS — I422 Other hypertrophic cardiomyopathy: Secondary | ICD-10-CM

## 2024-03-30 NOTE — Patient Instructions (Addendum)
 Medication Instructions:  Your physician recommends that you continue on your current medications as directed. Please refer to the Current Medication list given to you today.  Labwork: In 2-3 weeks at [facility]   Testing/Procedures: None   Follow-Up: Your physician recommends that you schedule a follow-up appointment in: 6 months   Any Other Special Instructions Will Be Listed Below (If Applicable).  If you need a refill on your cardiac medications before your next appointment, please call your pharmacy.

## 2024-03-30 NOTE — Progress Notes (Unsigned)
 Cardiology Office Note   Date:  03/30/2024 ID:  VONN SLIGER, DOB 07/29/1946, MRN [id number] PCP: Bertell Satterfield, MD  [facility] Providers Cardiologist:  Lynwood Schilling, MD Electrophysiologist:  Eulas FORBES Furbish, MD     History of Present Illness Lonnie [name] is a 78 y.o. male with a PMH of PAF, A-flutter, s/p Watchman device, CAD, s/p CABG and mitral valve replacement, HFrEF, hx of VT/VF arrest, hx of papillary muslce rupture, s/p ICD, hx of syncope and SDH, lower extremity swelling, who presents today for post DCCV.   Last seen by Dr. Schilling on 01/03/2024. Seen by EP on 01/10/2024. DCCV was arranged.  He is overall doing well since his cardioversion.  Wife states he will be finishing up Eliquis  this month.  Does have history of Watchman device.  Patient today denies any acute cardiac complaints or concerns. Denies any chest pain, shortness of breath, palpitations, syncope, presyncope, dizziness, orthopnea, PND, swelling or significant weight changes, acute bleeding, or claudication.   ROS: Negative. See HPI.   Studies Reviewed  EKG:  EKG Interpretation Date/Time:  Monday March 30 2024 08:21:54 EDT Ventricular Rate:  64 PR Interval:  218 QRS Duration:  176 QT Interval:  506 QTC Calculation: 522 R Axis:   -71  Text Interpretation: AV dual-paced rhythm with prolonged AV conduction Biventricular pacemaker detected When compared with ECG of 14-Feb-2024 09:11, Vent. rate has decreased BY   5 BPM Confirmed by Miriam Norris [phone]) on 03/30/2024 8:42:26 AM    Echo 12/2022:   1. Left ventricular ejection fraction, by estimation, is 45 to 50%. The  left ventricle has mildly decreased function. The left ventricle  demonstrates regional wall motion abnormalities (see scoring  diagram/findings for description). There is moderate  asymmetric left ventricular hypertrophy of the septal segment. Left  ventricular diastolic parameters are indeterminate.   2. Right ventricular  systolic function is normal. The right ventricular  size is normal. There is normal pulmonary artery systolic pressure. The  estimated right ventricular systolic pressure is 33.0 mmHg.   3. Left atrial size was moderately dilated.   4. Right atrial size was mildly dilated.   5. The mitral valve has been repaired/replaced. Trivial mitral valve  regurgitation. The mean mitral valve gradient is 9.0 mmHg. There is a 32  mm prosthetic annuloplasty ring present in the mitral position as well as  Edwards Magna pericardial prosthesis.   6. The aortic valve is tricuspid. There is mild calcification of the  aortic valve. Aortic valve regurgitation is trivial. No aortic stenosis is  present. Aortic valve mean gradient measures 5.0 mmHg.   Comparison(s): Prior images unable to be directly viewed. LVEF has  improved to 45-50% range. Status post mitral annuloplasty and  bioprosthetic MVR as outlined. Mean gradent has increased from 6 mmHg in  2019 to 9 mmHg suggesting some degree of prosthetic  mitral stenosis, mild to moderate.   Physical Exam VS:  BP 112/72 (BP Location: Left Arm)   Pulse 64   Ht 6' (1.829 m)   Wt 218 lb 3.2 oz (99 kg)   SpO2 97%   BMI 29.59 kg/m        Wt Readings from Last 3 Encounters:  03/30/24 218 lb 3.2 oz (99 kg)  02/14/24 210 lb (95.3 kg)  01/10/24 229 lb (103.9 kg)    GEN: Well nourished, well developed in no acute distress NECK: No JVD; No carotid bruits CARDIAC: S1/S2, RRR, no murmurs, rubs, gallops RESPIRATORY:  Clear to  auscultation without rales, wheezing or rhonchi  ABDOMEN: Soft, non-tender, non-distended EXTREMITIES:  No edema; No deformity   ASSESSMENT AND PLAN  PAF/A-flutter, s/p DCCV and hx of Watchman device Doing well post cardioversion.  Denies any palpitations or tachycardia.  EKG today reveals AV dual paced rhythm. RRR.  He will be finishing out Eliquis  this month.  Past history of subdural hematoma and syncope, therefore not a candidate for  long-term anticoagulation-does have presence of Watchman device.  Continue current medication regimen as instructed and continue follow-up with EP as scheduled. Denies any current bleeding issues. Will obtain CBC and CMET.   CAD, s/p CABG Stable with no anginal symptoms. No indication for ischemic evaluation.  Continue current medication regimen. Heart healthy diet and regular cardiovascular exercise encouraged. Will obtain FLP in addition to labs as mentioned above.   HFmrEF Stage C, NYHA class I-II symptoms. Echo in April 2024 showed EF 45 to 50%. Euvolemic and well compensated on exam.  GDMT limited due to past history of syncope and BP trends. Continue current medication regimen. Low sodium diet, fluid restriction <2L, and daily weights encouraged. Educated to contact our office for weight gain of 2 lbs overnight or 5 lbs in one week.  Hx of mitral valve replacement/repair Echocardiogram in April 2024 revealed trivial MR with mean gradient 9.0 mmHg, there was also findings of a 32 mm prosthetic annuloplasty ring present in mitral position as well as Edwards Magna pericardial prosthesis.  Denies any concerning signs or symptoms.  Will continue to follow.  Hx of Vtach/VF arrest, s/p ICD Denies any issues. Continue to follow-up with EP.   6. CKD stage 3b Most recent kidney function relatively stable. Avoid nephrotoxic agents. Will recheck labs as noted above. No medication changes at this time.       Dispo: Follow-up with MD/APP in 6 months or sooner if any changes.  Signed, Almarie Crate, NP

## 2024-04-01 NOTE — Progress Notes (Signed)
 EPIC Encounter for ICM Monitoring  Patient Name: Lonnie [name] is a 78 y.o. male Date: 04/01/2024 Primary Care Physican: Bertell Satterfield, MD Primary Cardiologist: Hochrein Electrophysiologist: Mealor BiV Pacing: 85% 03/30/2024 Office Weight: 218 lbs   AT/AF Burden: 74% (watchman; hx of subdural hematoma)           Transmission results reviewed.  Pt seen in the office by Almarie Crate, NP and noted Euvolemic and well compensated on exam    Corvue thoracic impedance suggesting possible fluid accumulation starting 7/17 and returned close to baseline 7/21.   Prescribed: Furosemide  40 mg 1 tablet daily. (Wife distributes his medications)   Labs: 02/12/2024 Creatinine 1.68, BUN 26, Potassium 3.9, Sodium 137. GFR 41 A complete set of results can be found in Results Review.   Recommendations:  No changes.   Follow-up plan: ICM clinic phone appointment 05/04/2024.  91 day device clinic remote transmission 10/145/2025.     EP/Cardiology Office Visits:    05/15/2024 with Dr Nancey.   Copy of ICM check sent to Dr. Nancey.   3 month ICM trend: 03/30/2024.    12-14 Month ICM trend:     Lonnie GORMAN Garner, RN 04/01/2024 7:19 AM

## 2024-04-10 ENCOUNTER — Encounter: Payer: Medicare Other | Admitting: Cardiovascular Disease

## 2024-04-22 DIAGNOSIS — Z79899 Other long term (current) drug therapy: Secondary | ICD-10-CM | POA: Diagnosis not present

## 2024-04-22 DIAGNOSIS — I480 Paroxysmal atrial fibrillation: Secondary | ICD-10-CM | POA: Diagnosis not present

## 2024-04-22 DIAGNOSIS — N1831 Chronic kidney disease, stage 3a: Secondary | ICD-10-CM | POA: Diagnosis not present

## 2024-04-22 DIAGNOSIS — I422 Other hypertrophic cardiomyopathy: Secondary | ICD-10-CM | POA: Diagnosis not present

## 2024-04-23 LAB — CBC
Hematocrit: 33.2 % — ABNORMAL LOW (ref 37.5–51.0)
Hemoglobin: 9.9 g/dL — ABNORMAL LOW (ref 13.0–17.7)
MCH: 26.9 pg (ref 26.6–33.0)
MCHC: 29.8 g/dL — ABNORMAL LOW (ref 31.5–35.7)
MCV: 90 fL (ref 79–97)
Platelets: 309 x10E3/uL (ref 150–450)
RBC: 3.68 x10E6/uL — ABNORMAL LOW (ref 4.14–5.80)
RDW: 14.6 % (ref 11.6–15.4)
WBC: 7.3 x10E3/uL (ref 3.4–10.8)

## 2024-04-23 LAB — LIPID PANEL
Chol/HDL Ratio: 2.3 ratio (ref 0.0–5.0)
Cholesterol, Total: 117 mg/dL (ref 100–199)
HDL: 50 mg/dL (ref >39)
LDL Chol Calc (NIH): 45 mg/dL (ref 0–99)
Triglycerides: 127 mg/dL (ref 0–149)
VLDL Cholesterol Cal: 22 mg/dL (ref 5–40)

## 2024-04-23 LAB — COMPREHENSIVE METABOLIC PANEL WITH GFR
ALT: 31 IU/L (ref 0–44)
AST: 26 IU/L (ref 0–40)
Albumin: 3.9 g/dL (ref 3.8–4.8)
Alkaline Phosphatase: 127 IU/L — ABNORMAL HIGH (ref 44–121)
BUN/Creatinine Ratio: 13 (ref 10–24)
BUN: 20 mg/dL (ref 8–27)
Bilirubin Total: 0.9 mg/dL (ref 0.0–1.2)
CO2: 22 mmol/L (ref 20–29)
Calcium: 8.9 mg/dL (ref 8.6–10.2)
Chloride: 103 mmol/L (ref 96–106)
Creatinine, Ser: 1.52 mg/dL — ABNORMAL HIGH (ref 0.76–1.27)
Globulin, Total: 2.9 g/dL (ref 1.5–4.5)
Glucose: 104 mg/dL — ABNORMAL HIGH (ref 70–99)
Potassium: 4.4 mmol/L (ref 3.5–5.2)
Sodium: 141 mmol/L (ref 134–144)
Total Protein: 6.8 g/dL (ref 6.0–8.5)
eGFR: 47 mL/min/1.73 — ABNORMAL LOW (ref >59)

## 2024-04-28 ENCOUNTER — Ambulatory Visit: Payer: Self-pay | Admitting: Nurse Practitioner

## 2024-05-04 ENCOUNTER — Telehealth: Payer: Self-pay | Admitting: Nurse Practitioner

## 2024-05-04 ENCOUNTER — Ambulatory Visit: Attending: Cardiovascular Disease

## 2024-05-04 DIAGNOSIS — I5022 Chronic systolic (congestive) heart failure: Secondary | ICD-10-CM

## 2024-05-04 DIAGNOSIS — Z95810 Presence of automatic (implantable) cardiac defibrillator: Secondary | ICD-10-CM | POA: Diagnosis not present

## 2024-05-04 NOTE — Telephone Encounter (Signed)
 The patient has been notified of the result and verbalized understanding.  All questions (if any) were answered. Advised patient will route to PCP Littie CHRISTELLA Croak, CMA 05/04/2024 3:17 PM

## 2024-05-04 NOTE — Telephone Encounter (Signed)
 Pt's wife returning call regarding results. Please advise

## 2024-05-05 ENCOUNTER — Encounter: Payer: Self-pay | Admitting: *Deleted

## 2024-05-06 ENCOUNTER — Encounter: Payer: Self-pay | Admitting: *Deleted

## 2024-05-07 NOTE — Progress Notes (Signed)
 EPIC Encounter for ICM Monitoring  Patient Name: Lonnie [name] is a 78 y.o. male Date: 05/07/2024 Primary Care Physican: Bertell Satterfield, MD Primary Cardiologist: Hochrein Electrophysiologist: Mealor BiV Pacing: 88% 03/30/2024 Office Weight: 218 lbs   AT/AF Burden: 61% (watchman; hx of subdural hematoma)           Transmission results reviewed.     Corvue thoracic impedance suggesting intermittent days with possible fluid accumulation within the last month.   Prescribed: Furosemide  40 mg 1 tablet daily. (Wife distributes his medications)   Labs: 04/22/2024 Creatinine 1.52, BUN 20, Potassium 4.4, Sodium 141, GFR 47 02/12/2024 Creatinine 1.68, BUN 26, Potassium 3.9, Sodium 137. GFR 41 A complete set of results can be found in Results Review.   Recommendations:  No changes.   Follow-up plan: ICM clinic phone appointment 06/08/2024.  91 day device clinic remote transmission 06/23/2024.     EP/Cardiology Office Visits:    05/15/2024 with Dr Nancey.   Copy of ICM check sent to Dr. Nancey.    3 month ICM trend: 05/04/2024.    12-14 Month ICM trend:     [name], RN 05/07/2024 8:12 AM

## 2024-05-15 ENCOUNTER — Ambulatory Visit: Payer: Medicare Other | Attending: Cardiovascular Disease | Admitting: Cardiovascular Disease

## 2024-05-15 ENCOUNTER — Encounter: Payer: Self-pay | Admitting: Cardiovascular Disease

## 2024-05-15 VITALS — BP 110/60 | HR 71 | Ht 72.0 in | Wt 222.0 lb

## 2024-05-15 DIAGNOSIS — Z95810 Presence of automatic (implantable) cardiac defibrillator: Secondary | ICD-10-CM

## 2024-05-15 DIAGNOSIS — I4892 Unspecified atrial flutter: Secondary | ICD-10-CM

## 2024-05-15 DIAGNOSIS — I255 Ischemic cardiomyopathy: Secondary | ICD-10-CM

## 2024-05-15 DIAGNOSIS — I509 Heart failure, unspecified: Secondary | ICD-10-CM | POA: Diagnosis not present

## 2024-05-15 LAB — CUP PACEART INCLINIC DEVICE CHECK
Battery Remaining Longevity: 63 mo
Brady Statistic RA Percent Paced: 39 %
Brady Statistic RV Percent Paced: 89 %
Date Time Interrogation Session: 20250905161005
HighPow Impedance: 63 Ohm
Implantable Lead Connection Status: 753985
Implantable Lead Connection Status: 753985
Implantable Lead Connection Status: 753985
Implantable Lead Implant Date: 20150326
Implantable Lead Implant Date: 20150326
Implantable Lead Implant Date: 20230405
Implantable Lead Location: 753858
Implantable Lead Location: 753859
Implantable Lead Location: 753860
Implantable Pulse Generator Implant Date: 20230405
Lead Channel Impedance Value: 325 Ohm
Lead Channel Impedance Value: 350 Ohm
Lead Channel Impedance Value: 962.5 Ohm
Lead Channel Pacing Threshold Amplitude: 0.5 V
Lead Channel Pacing Threshold Amplitude: 0.875 V
Lead Channel Pacing Threshold Amplitude: 1.25 V
Lead Channel Pacing Threshold Amplitude: 1.25 V
Lead Channel Pacing Threshold Pulse Width: 0.5 ms
Lead Channel Pacing Threshold Pulse Width: 0.5 ms
Lead Channel Pacing Threshold Pulse Width: 0.5 ms
Lead Channel Pacing Threshold Pulse Width: 0.5 ms
Lead Channel Sensing Intrinsic Amplitude: 12 mV
Lead Channel Sensing Intrinsic Amplitude: 3.1 mV
Lead Channel Setting Pacing Amplitude: 1.5 V
Lead Channel Setting Pacing Amplitude: 1.875
Lead Channel Setting Pacing Amplitude: 2.5 V
Lead Channel Setting Pacing Pulse Width: 0.5 ms
Lead Channel Setting Pacing Pulse Width: 0.5 ms
Lead Channel Setting Sensing Sensitivity: 0.5 mV
Pulse Gen Serial Number: 210000168

## 2024-05-15 NOTE — Progress Notes (Signed)
  Electrophysiology Office Note:    Date:  05/15/2024   ID:  YANDELL MCJUNKINS, DOB [birth year], MRN [id number]  PCP:  Bertell Satterfield, MD   [facility] Providers Cardiologist:  Lynwood Schilling, MD Electrophysiologist:  Eulas FORBES Furbish, MD     Referring MD: Bertell Satterfield, MD   History of Present Illness:    Lonnie [name] is a 78 y.o. male with a medical history significant for CABG and bioprosthetic mitral valve replacement, late presenting MI, VT/Vf cardiac arrest with papillary muscle rupture referred for arrhythmia and device follow-up.     He has an Abbott ICD placed in March 2015.  He has had device therapy before.  The device was upgraded to a BiV in April 2023.  He also has a history of atrial fibrillation and Watchman procedure.  He has device detected atrial a flutter, BiV pacing has been suboptimal due to his arrhythmia.  He underwent DC cardioversion in June 2025.  He did not notice any change in symptoms after the cardioversion --no improvement in energy levels.       Today, he reports that he is at baseline.  He has not had any recent palpitations, presyncope, syncope.  His exertional capacity is baseline.  He notes dependent edema, but states that that this has been unchanged for months if not longer.  EKGs/Labs/Other Studies Reviewed Today:     Echocardiogram:  TTE April 2024 EF 45 to 50%.  Moderate asymptomatic LVH.  Left atrium moderately dilated, right atrium mildly dilated.  Mitral valve replacement with trivial MR.    EKG:         Physical Exam:    VS:  BP 110/60   Pulse 71   Ht 6' (1.829 m)   Wt 222 lb (100.7 kg)   SpO2 95%   BMI 30.11 kg/m     Wt Readings from Last 3 Encounters:  05/15/24 222 lb (100.7 kg)  03/30/24 218 lb 3.2 oz (99 kg)  02/14/24 210 lb (95.3 kg)     GEN:  Well nourished, well developed in no acute distress CARDIAC: RRR, no murmurs, rubs, gallops RESPIRATORY:  Normal work of breathing MUSCULOSKELETAL: 2+  edema    ASSESSMENT & PLAN:     Medtronic BiV ICD Device was interrogated today.  I reviewed the interrogation.  See Paceart for details He is not device dependent.   Secondary Hypercoagulable state History of subdural hematoma Watchman device in place  Systolic heart failure Will increase bisoprolol  to 20 mg daily today  Atrial tachycardia - likely a slow atrial flutter He is unaware, asymptomatic V-Rate better controlled with Bisoprolol , BiV pacing improved Maintaining sinus rhythm now since cardioversion 02/2024  Follow-up in 6 months.    Signed, Eulas FORBES Furbish, MD  05/15/2024 4:17 PM    [facility]

## 2024-05-15 NOTE — Patient Instructions (Addendum)
 Medication Instructions:  Continue all current medications.   Labwork: none  Testing/Procedures: none  Follow-Up: 6 months   Any Other Special Instructions Will Be Listed Below (If Applicable).   If you need a refill on your cardiac medications before your next appointment, please call your pharmacy.

## 2024-06-08 ENCOUNTER — Ambulatory Visit: Attending: Cardiovascular Disease

## 2024-06-08 DIAGNOSIS — I5022 Chronic systolic (congestive) heart failure: Secondary | ICD-10-CM | POA: Diagnosis not present

## 2024-06-08 DIAGNOSIS — Z95810 Presence of automatic (implantable) cardiac defibrillator: Secondary | ICD-10-CM

## 2024-06-10 NOTE — Progress Notes (Signed)
 EPIC Encounter for ICM Monitoring  Patient Name: Lonnie [name] is a 78 y.o. male Date: 06/10/2024 Primary Care Physican: Bertell Satterfield, MD Primary Cardiologist: Hochrein Electrophysiologist: Mealor BiV Pacing: <99% 03/30/2024 Office Weight: 218 lbs   AT/AF Burden: 0% (watchman; hx of subdural hematoma)           Transmission results reviewed.     Since 05/04/2024 ICM Remote Transmission: Corvue thoracic impedance suggesting normal fluid levels.   Prescribed: Furosemide  40 mg 1 tablet daily. (Wife distributes his medications)   Labs: 04/22/2024 Creatinine 1.52, BUN 20, Potassium 4.4, Sodium 141, GFR 47 02/12/2024 Creatinine 1.68, BUN 26, Potassium 3.9, Sodium 137. GFR 41 A complete set of results can be found in Results Review.   Recommendations:  No changes.   Follow-up plan: ICM clinic phone appointment 07/21/2024.  91 day device clinic remote transmission 06/23/2024.     EP/Cardiology Office Visits:    Recall 11/11/2024 with Dr Nancey.  Recall 09/26/2024 with Dr Lavona.   Copy of ICM check sent to Dr. Nancey.   Remote monitoring is medically necessary for Heart Failure Management.    90 day Daily Thoracic Impedance ICM trend: 03/10/2024 through 06/08/2024.    12-14 Month Thoracic Impedance ICM trend:     Lonnie GORMAN Garner, RN 06/10/2024 4:21 PM

## 2024-06-17 NOTE — Progress Notes (Signed)
 Remote ICD Transmission

## 2024-06-23 ENCOUNTER — Ambulatory Visit: Payer: Medicare Other

## 2024-06-23 DIAGNOSIS — I5022 Chronic systolic (congestive) heart failure: Secondary | ICD-10-CM

## 2024-06-25 ENCOUNTER — Ambulatory Visit: Payer: Self-pay | Admitting: Internal Medicine

## 2024-06-25 LAB — CUP PACEART REMOTE DEVICE CHECK
Battery Remaining Longevity: 62 mo
Battery Remaining Percentage: 68 %
Battery Voltage: 2.98 V
Brady Statistic AP VP Percent: 90 %
Brady Statistic AP VS Percent: 1 %
Brady Statistic AS VP Percent: 9.9 %
Brady Statistic AS VS Percent: 1 %
Brady Statistic RA Percent Paced: 89 %
Date Time Interrogation Session: 20251014021006
HighPow Impedance: 63 Ohm
Implantable Lead Connection Status: 753985
Implantable Lead Connection Status: 753985
Implantable Lead Connection Status: 753985
Implantable Lead Implant Date: 20150326
Implantable Lead Implant Date: 20150326
Implantable Lead Implant Date: 20230405
Implantable Lead Location: 753858
Implantable Lead Location: 753859
Implantable Lead Location: 753860
Implantable Pulse Generator Implant Date: 20230405
Lead Channel Impedance Value: 1025 Ohm
Lead Channel Impedance Value: 310 Ohm
Lead Channel Impedance Value: 350 Ohm
Lead Channel Pacing Threshold Amplitude: 0.5 V
Lead Channel Pacing Threshold Amplitude: 1 V
Lead Channel Pacing Threshold Amplitude: 1.25 V
Lead Channel Pacing Threshold Pulse Width: 0.5 ms
Lead Channel Pacing Threshold Pulse Width: 0.5 ms
Lead Channel Pacing Threshold Pulse Width: 0.5 ms
Lead Channel Sensing Intrinsic Amplitude: 3.2 mV
Lead Channel Sensing Intrinsic Amplitude: 8.9 mV
Lead Channel Setting Pacing Amplitude: 1.5 V
Lead Channel Setting Pacing Amplitude: 2 V
Lead Channel Setting Pacing Amplitude: 2.5 V
Lead Channel Setting Pacing Pulse Width: 0.5 ms
Lead Channel Setting Pacing Pulse Width: 0.5 ms
Lead Channel Setting Sensing Sensitivity: 0.5 mV
Pulse Gen Serial Number: 210000168

## 2024-06-26 NOTE — Progress Notes (Signed)
 Remote ICD Transmission

## 2024-07-20 ENCOUNTER — Telehealth: Payer: Self-pay

## 2024-07-20 ENCOUNTER — Ambulatory Visit: Attending: Cardiovascular Disease

## 2024-07-20 DIAGNOSIS — Z95810 Presence of automatic (implantable) cardiac defibrillator: Secondary | ICD-10-CM | POA: Diagnosis not present

## 2024-07-20 DIAGNOSIS — I5022 Chronic systolic (congestive) heart failure: Secondary | ICD-10-CM

## 2024-07-20 NOTE — Telephone Encounter (Signed)
 Remote ICM transmission received.  Attempted call to patient regarding ICM remote transmission.  Left detailed message per DPR with ICM phone number to return call for any questions, concerns or fluid symptoms.

## 2024-07-20 NOTE — Progress Notes (Signed)
 EPIC Encounter for ICM Monitoring  Patient Name: Lonnie [name] is a 78 y.o. male Date: 07/20/2024 Primary Care Physican: Bertell Satterfield, MD Primary Cardiologist: Hochrein Electrophysiologist: Mealor BiV Pacing: <99% 03/30/2024 Office Weight: 218 lbs 05/15/2024 Office Weight: 222 lbs   AT/AF Burden: 0% (watchman; hx of subdural hematoma)           Attempted call to patient and unable to reach.  Left detailed message per DPR regarding transmission.  Transmission results reviewed.    Since 06/08/2024 ICM Remote Transmission: Corvue thoracic impedance suggesting normal fluid levels with exception of possible fluid accumulation starting 07/18/2024.  Also suggesting possible fluid accumulation from 06/13/2024-06/18/2024.   Prescribed: Furosemide  40 mg 1 tablet daily. (Wife distributes his medications)   Labs: 04/22/2024 Creatinine 1.52, BUN 20, Potassium 4.4, Sodium 141, GFR 47 02/12/2024 Creatinine 1.68, BUN 26, Potassium 3.9, Sodium 137. GFR 41 A complete set of results can be found in Results Review.   Recommendations:  Left voice mail with ICM number and encouraged to call if experiencing any fluid symptoms.   Follow-up plan: ICM clinic phone appointment 08/03/2024 to recheck fluid levels.  91 day device clinic remote transmission 1/13/20265.     EP/Cardiology Office Visits:    Recall 11/11/2024 with Dr Nancey.  Recall 09/26/2024 with Dr Lavona.   Copy of ICM check sent to Dr. Nancey.   Remote monitoring is medically necessary for Heart Failure Management.    Daily Thoracic Impedance ICM trend: 04/21/2024 through 07/20/2024.    12-14 Month Thoracic Impedance ICM trend:     [name], RN 07/20/2024 3:00 PM

## 2024-08-03 ENCOUNTER — Ambulatory Visit: Attending: Cardiovascular Disease

## 2024-08-03 DIAGNOSIS — Z95810 Presence of automatic (implantable) cardiac defibrillator: Secondary | ICD-10-CM

## 2024-08-03 DIAGNOSIS — I5022 Chronic systolic (congestive) heart failure: Secondary | ICD-10-CM

## 2024-08-04 NOTE — Progress Notes (Signed)
 EPIC Encounter for ICM Monitoring  Patient Name: Lonnie [name] is a 78 y.o. male Date: 08/04/2024 Primary Care Physican: Bertell Satterfield, MD Primary Cardiologist: Hochrein Electrophysiologist: Mealor BiV Pacing: >99% 03/30/2024 Office Weight: 218 lbs 05/15/2024 Office Weight: 222 lbs   AT/AF Burden: 0% (watchman; hx of subdural hematoma)          Transmission results reviewed.    Since 07/20/2024 ICM Remote Transmission: Corvue thoracic impedance suggesting fluid levels returned to normal 08/03/2024.   Prescribed: Furosemide  40 mg 1 tablet daily. (Wife distributes his medications)   Labs: 04/22/2024 Creatinine 1.52, BUN 20, Potassium 4.4, Sodium 141, GFR 47 02/12/2024 Creatinine 1.68, BUN 26, Potassium 3.9, Sodium 137. GFR 41 A complete set of results can be found in Results Review.   Recommendations:  No changes   Follow-up plan: ICM clinic phone appointment 09/14/2024.  91 day device clinic remote transmission 1/13/20265.     EP/Cardiology Office Visits:    Recall 11/11/2024 with Dr Nancey.  Recall 09/26/2024 with Dr Lavona.   Copy of ICM check sent to Dr. Nancey.   Remote monitoring is medically necessary for Heart Failure Management.    Daily Thoracic Impedance ICM trend: 08/03/2024.    [name], RN 08/04/2024 2:18 PM

## 2024-09-14 ENCOUNTER — Ambulatory Visit: Attending: Cardiovascular Disease

## 2024-09-14 DIAGNOSIS — I5022 Chronic systolic (congestive) heart failure: Secondary | ICD-10-CM | POA: Diagnosis not present

## 2024-09-14 DIAGNOSIS — Z95810 Presence of automatic (implantable) cardiac defibrillator: Secondary | ICD-10-CM

## 2024-09-16 NOTE — Progress Notes (Signed)
 EPIC Encounter for ICM Monitoring  Patient Name: Lonnie [name] is a 79 y.o. male Date: 09/16/2024 Primary Care Physican: Bertell Satterfield, MD Primary Cardiologist: Hochrein Electrophysiologist: Mealor BiV Pacing: >99% 03/30/2024 Office Weight: 218 lbs 05/15/2024 Office Weight: 222 lbs   AT/AF Burden: 0% (watchman; hx of subdural hematoma)          Transmission results reviewed.    Since 08/03/2024 ICM Remote Transmission: Corvue thoracic impedance suggesting normal fluid levels with the exception of possible fluid accumulation from 08/27/2024-09/05/2024.   Prescribed: Furosemide  40 mg 1 tablet daily. (Wife distributes his medications)   Labs: 04/22/2024 Creatinine 1.52, BUN 20, Potassium 4.4, Sodium 141, GFR 47 02/12/2024 Creatinine 1.68, BUN 26, Potassium 3.9, Sodium 137. GFR 41 A complete set of results can be found in Results Review.   Recommendations:  No changes   Follow-up plan: ICM clinic phone appointment 10/15/2024.  91 day device clinic remote transmission 1/13/20265.     EP/Cardiology Office Visits:    Recall 11/11/2024 with Dr Nancey.  Recall 09/26/2024 with Dr Lavona.   Copy of ICM check sent to Dr. Nancey.   Remote monitoring is medically necessary for Heart Failure Management.    Daily Thoracic Impedance ICM trend: 06/16/2024 through 09/14/2024.    12-14 Month Thoracic Impedance ICM trend:     [name], RN 09/16/2024 12:23 PM

## 2024-09-22 ENCOUNTER — Ambulatory Visit

## 2024-09-22 DIAGNOSIS — I5022 Chronic systolic (congestive) heart failure: Secondary | ICD-10-CM

## 2024-09-23 LAB — CUP PACEART REMOTE DEVICE CHECK
Battery Remaining Longevity: 59 mo
Battery Remaining Percentage: 65 %
Battery Voltage: 2.98 V
Brady Statistic AP VP Percent: 89 %
Brady Statistic AP VS Percent: 1 %
Brady Statistic AS VP Percent: 10 %
Brady Statistic AS VS Percent: 1 %
Brady Statistic RA Percent Paced: 89 %
Date Time Interrogation Session: 20260113020208
HighPow Impedance: 63 Ohm
Implantable Lead Connection Status: 753985
Implantable Lead Connection Status: 753985
Implantable Lead Connection Status: 753985
Implantable Lead Implant Date: 20150326
Implantable Lead Implant Date: 20150326
Implantable Lead Implant Date: 20230405
Implantable Lead Location: 753858
Implantable Lead Location: 753859
Implantable Lead Location: 753860
Implantable Pulse Generator Implant Date: 20230405
Lead Channel Impedance Value: 310 Ohm
Lead Channel Impedance Value: 350 Ohm
Lead Channel Impedance Value: 840 Ohm
Lead Channel Pacing Threshold Amplitude: 0.5 V
Lead Channel Pacing Threshold Amplitude: 1 V
Lead Channel Pacing Threshold Amplitude: 1.25 V
Lead Channel Pacing Threshold Pulse Width: 0.5 ms
Lead Channel Pacing Threshold Pulse Width: 0.5 ms
Lead Channel Pacing Threshold Pulse Width: 0.5 ms
Lead Channel Sensing Intrinsic Amplitude: 10.3 mV
Lead Channel Sensing Intrinsic Amplitude: 3.2 mV
Lead Channel Setting Pacing Amplitude: 1.5 V
Lead Channel Setting Pacing Amplitude: 2 V
Lead Channel Setting Pacing Amplitude: 2.5 V
Lead Channel Setting Pacing Pulse Width: 0.5 ms
Lead Channel Setting Pacing Pulse Width: 0.5 ms
Lead Channel Setting Sensing Sensitivity: 0.5 mV
Pulse Gen Serial Number: 210000168

## 2024-09-25 ENCOUNTER — Ambulatory Visit: Payer: Self-pay | Admitting: Cardiovascular Disease

## 2024-09-25 NOTE — Progress Notes (Signed)
 Remote ICD Transmission

## 2024-10-07 ENCOUNTER — Other Ambulatory Visit: Payer: Self-pay | Admitting: Cardiovascular Disease

## 2024-10-08 ENCOUNTER — Encounter: Payer: Self-pay | Admitting: Cardiovascular Disease

## 2024-10-08 MED ORDER — BISOPROLOL FUMARATE 10 MG PO TABS: 20.0000 mg | ORAL_TABLET | Freq: Every day | ORAL | 3 refills | Status: AC

## 2024-10-08 NOTE — Progress Notes (Signed)
 31 day ICM Remote transmission canceled due to [facility] clinic is on hold until further notice.  91 day remote monitoring will continue per protocol.

## 2024-10-15 ENCOUNTER — Ambulatory Visit

## 2024-12-22 ENCOUNTER — Ambulatory Visit

## 2025-03-23 ENCOUNTER — Ambulatory Visit

## 2025-06-22 ENCOUNTER — Ambulatory Visit
